# Patient Record
Sex: Male | Born: 1960 | Race: Black or African American | Hispanic: No | Marital: Married | Smoking: Former smoker
Health system: Southern US, Community
[De-identification: ages and names within clinical notes are randomized; demographics above are authoritative.]

## PROBLEM LIST (undated history)

## (undated) DIAGNOSIS — K029 Dental caries, unspecified: Secondary | ICD-10-CM

## (undated) DIAGNOSIS — C801 Malignant (primary) neoplasm, unspecified: Secondary | ICD-10-CM

## (undated) DIAGNOSIS — H547 Unspecified visual loss: Secondary | ICD-10-CM

## (undated) DIAGNOSIS — E669 Obesity, unspecified: Secondary | ICD-10-CM

## (undated) DIAGNOSIS — Z2821 Immunization not carried out because of patient refusal: Secondary | ICD-10-CM

## (undated) DIAGNOSIS — I10 Essential (primary) hypertension: Secondary | ICD-10-CM

## (undated) DIAGNOSIS — E119 Type 2 diabetes mellitus without complications: Secondary | ICD-10-CM

## (undated) DIAGNOSIS — Z720 Tobacco use: Secondary | ICD-10-CM

## (undated) DIAGNOSIS — C649 Malignant neoplasm of unspecified kidney, except renal pelvis: Secondary | ICD-10-CM

## (undated) DIAGNOSIS — M419 Scoliosis, unspecified: Secondary | ICD-10-CM

## (undated) DIAGNOSIS — E785 Hyperlipidemia, unspecified: Secondary | ICD-10-CM

## (undated) HISTORY — DX: Hyperlipidemia, unspecified: E78.5

## (undated) HISTORY — DX: Obesity, unspecified: E66.9

## (undated) HISTORY — DX: Malignant (primary) neoplasm, unspecified: C80.1

## (undated) HISTORY — DX: Unspecified visual loss: H54.7

## (undated) HISTORY — DX: Tobacco use: Z72.0

## (undated) HISTORY — DX: Scoliosis, unspecified: M41.9

## (undated) HISTORY — PX: COLONOSCOPY W/ POLYPECTOMY: SHX1380

## (undated) HISTORY — DX: Immunization not carried out because of patient refusal: Z28.21

## (undated) HISTORY — DX: Type 2 diabetes mellitus without complications: E11.9

## (undated) HISTORY — DX: Dental caries, unspecified: K02.9

---

## 1994-02-17 HISTORY — PX: EYE SURGERY: SHX253

## 2005-04-06 ENCOUNTER — Emergency Department (HOSPITAL_COMMUNITY): Admission: EM | Admit: 2005-04-06 | Discharge: 2005-04-06 | Payer: Self-pay | Admitting: Emergency Medicine

## 2012-01-18 DIAGNOSIS — E119 Type 2 diabetes mellitus without complications: Secondary | ICD-10-CM

## 2012-01-18 HISTORY — DX: Type 2 diabetes mellitus without complications: E11.9

## 2012-02-12 ENCOUNTER — Emergency Department (HOSPITAL_COMMUNITY)
Admission: EM | Admit: 2012-02-12 | Discharge: 2012-02-12 | Disposition: A | Discharge status: Home / Self Care | Payer: Self-pay | Attending: Emergency Medicine | Admitting: Emergency Medicine

## 2012-02-12 ENCOUNTER — Encounter (HOSPITAL_COMMUNITY): Payer: Self-pay | Admitting: Emergency Medicine

## 2012-02-12 DIAGNOSIS — R358 Other polyuria: Secondary | ICD-10-CM | POA: Insufficient documentation

## 2012-02-12 DIAGNOSIS — R3589 Other polyuria: Secondary | ICD-10-CM | POA: Insufficient documentation

## 2012-02-12 DIAGNOSIS — R631 Polydipsia: Secondary | ICD-10-CM | POA: Insufficient documentation

## 2012-02-12 DIAGNOSIS — F17200 Nicotine dependence, unspecified, uncomplicated: Secondary | ICD-10-CM | POA: Insufficient documentation

## 2012-02-12 DIAGNOSIS — R3915 Urgency of urination: Secondary | ICD-10-CM | POA: Insufficient documentation

## 2012-02-12 DIAGNOSIS — E119 Type 2 diabetes mellitus without complications: Secondary | ICD-10-CM | POA: Insufficient documentation

## 2012-02-12 DIAGNOSIS — R350 Frequency of micturition: Secondary | ICD-10-CM | POA: Insufficient documentation

## 2012-02-12 LAB — URINE MICROSCOPIC-ADD ON

## 2012-02-12 LAB — POCT I-STAT, CHEM 8
BUN: 13 mg/dL (ref 6–23)
Creatinine, Ser: 0.9 mg/dL (ref 0.50–1.35)
Glucose, Bld: 472 mg/dL — ABNORMAL HIGH (ref 70–99)
Hemoglobin: 16.7 g/dL (ref 13.0–17.0)
Sodium: 137 mEq/L (ref 135–145)
TCO2: 24 mmol/L (ref 0–100)

## 2012-02-12 LAB — URINALYSIS, ROUTINE W REFLEX MICROSCOPIC
Bilirubin Urine: NEGATIVE
Glucose, UA: >1000 mg/dL — AB
Hgb urine dipstick: NEGATIVE
Ketones, ur: NEGATIVE mg/dL
Protein, ur: NEGATIVE mg/dL
Urobilinogen, UA: 1 mg/dL (ref 0.0–1.0)

## 2012-02-12 LAB — GLUCOSE, CAPILLARY: Glucose-Capillary: 464 mg/dL — ABNORMAL HIGH (ref 70–99)

## 2012-02-12 MED ORDER — METFORMIN HCL 500 MG PO TABS: 500.0000 mg | ORAL_TABLET | Freq: Two times a day (BID) | ORAL | Status: DC

## 2012-02-12 MED ORDER — SODIUM CHLORIDE 0.9 % IV BOLUS (SEPSIS)
1000.0000 mL | Freq: Once | INTRAVENOUS | Status: AC
  Administered 2012-02-12: 1000 mL via INTRAVENOUS

## 2012-02-12 NOTE — ED Provider Notes (Addendum)
History     CSN: [id number]  Arrival date & time 02/12/12  1352   First MD Initiated Contact with Patient 02/12/12 1502      No chief complaint on file.   (Consider location/radiation/quality/duration/timing/severity/associated sxs/prior treatment) Patient is a 51 y.o. male presenting with dysuria. The history is provided by the patient.  Dysuria  This is a new (Not truly dysuria frequency and urgency) problem. Episode onset: 1-2 week. The problem occurs every urination. The problem has been gradually worsening. Quality: No pain. The pain is at a severity of 0/10. The patient is experiencing no pain. There has been no fever. Associated symptoms include frequency and urgency. Pertinent negatives include no chills, no sweats, no vomiting and no discharge. Associated symptoms comments: Polydipsia and polyuria. He has tried nothing for the symptoms. His past medical history does not include recurrent UTIs.    History reviewed. No pertinent past medical history.  History reviewed. No pertinent past surgical history.  History reviewed. No pertinent family history.  History  Substance Use Topics  . Smoking status: Current Every Day Smoker -- 0.5 packs/day    Types: Cigarettes  . Smokeless tobacco: Not on file  . Alcohol Use: No      Review of Systems  Constitutional: Negative for fever, chills, fatigue and unexpected weight change.  Respiratory: Negative for cough and shortness of breath.   Gastrointestinal: Negative for vomiting.  Genitourinary: Positive for dysuria, urgency and frequency.  All other systems reviewed and are negative.    Allergies  Review of patient's allergies indicates no known allergies.  Home Medications  No current outpatient prescriptions on file.  BP 142/85  Pulse 92  Temp 97.6 F (36.4 C)  Resp 18  SpO2 98%  Physical Exam  Nursing note and vitals reviewed. Constitutional: He is oriented to person, place, and time. He appears well-developed  and well-nourished. No distress.  HENT:  Head: Normocephalic and atraumatic.  Mouth/Throat: Oropharynx is clear and moist.  Eyes: Conjunctivae normal and EOM are normal. Pupils are equal, round, and reactive to light.  Neck: Normal range of motion. Neck supple.  Cardiovascular: Normal rate, regular rhythm and intact distal pulses.   No murmur heard. Pulmonary/Chest: Effort normal and breath sounds normal. No respiratory distress. He has no wheezes. He has no rales.  Abdominal: Soft. He exhibits no distension. There is no tenderness. There is no rebound and no guarding.  Musculoskeletal: Normal range of motion. He exhibits no edema and no tenderness.  Neurological: He is alert and oriented to person, place, and time.  Skin: Skin is warm and dry. No rash noted. No erythema.  Psychiatric: He has a normal mood and affect. His behavior is normal.    ED Course  Procedures (including critical care time)  Labs Reviewed  GLUCOSE, CAPILLARY - Abnormal; Notable for the following:    Glucose-Capillary 464 (*)     All other components within normal limits  URINALYSIS, ROUTINE W REFLEX MICROSCOPIC - Abnormal; Notable for the following:    Specific Gravity, Urine 1.043 (*)     Glucose, UA >1000 (*)     All other components within normal limits  POCT I-STAT, CHEM 8 - Abnormal; Notable for the following:    Glucose, Bld 472 (*)     Calcium, Ion 1.27 (*)     All other components within normal limits  URINE MICROSCOPIC-ADD ON   No results found.   1. Diabetes       MDM   Patient with  polyuria, polydipsia, urinary frequency and urgency that started approximately 2 weeks ago and is worsening. He denies any fever or, dysuria, abdominal pain, chest pain, shortness of breath, weight change or fatigue. Patient stitch side blood sugar was 464. He has no prior history of diabetes but has a strong family history and based on his fingerstick blood sugar patient is a new onset diabetic. I-STAT and UA  pending. Will start patient on metformin and give him resources to find a regular Dr.        Gwyneth Sprout, MD 02/12/12 1536  Gwyneth Sprout, MD 02/12/12 [phone]

## 2012-02-12 NOTE — ED Notes (Signed)
C/o frequency, increasing thirst, denies burning with urination, states has been going on for 2 weeks, denies blood in urine

## 2012-02-24 ENCOUNTER — Institutional Professional Consult (permissible substitution): Payer: Self-pay | Admitting: Medical

## 2012-03-29 ENCOUNTER — Encounter: Payer: Self-pay | Admitting: Medical

## 2012-03-29 ENCOUNTER — Ambulatory Visit (INDEPENDENT_AMBULATORY_CARE_PROVIDER_SITE_OTHER): Payer: BC Managed Care – PPO | Admitting: Medical

## 2012-03-29 VITALS — BP 130/82 | HR 62 | Temp 98.3°F | Resp 16 | Ht 69.0 in | Wt 231.0 lb

## 2012-03-29 DIAGNOSIS — E669 Obesity, unspecified: Secondary | ICD-10-CM

## 2012-03-29 DIAGNOSIS — F17200 Nicotine dependence, unspecified, uncomplicated: Secondary | ICD-10-CM

## 2012-03-29 DIAGNOSIS — Z7189 Other specified counseling: Secondary | ICD-10-CM

## 2012-03-29 DIAGNOSIS — E119 Type 2 diabetes mellitus without complications: Secondary | ICD-10-CM

## 2012-03-29 DIAGNOSIS — K029 Dental caries, unspecified: Secondary | ICD-10-CM

## 2012-03-29 LAB — CBC WITH DIFFERENTIAL/PLATELET
Basophils Relative: 1 % (ref 0–1)
Eosinophils Absolute: 0.1 10*3/uL (ref 0.0–0.7)
MCH: 30 pg (ref 26.0–34.0)
MCHC: 33.9 g/dL (ref 30.0–36.0)
Neutro Abs: 5.2 10*3/uL (ref 1.7–7.7)
Neutrophils Relative %: 54 % (ref 43–77)
Platelets: 285 10*3/uL (ref 150–400)
RBC: 4.77 MIL/uL (ref 4.22–5.81)

## 2012-03-29 LAB — TSH: TSH: 0.771 u[IU]/mL (ref 0.350–4.500)

## 2012-03-29 LAB — COMPREHENSIVE METABOLIC PANEL
AST: 11 U/L (ref 0–37)
Albumin: 4.4 g/dL (ref 3.5–5.2)
Alkaline Phosphatase: 81 U/L (ref 39–117)
Calcium: 9.3 mg/dL (ref 8.4–10.5)
Chloride: 106 mEq/L (ref 96–112)
Glucose, Bld: 102 mg/dL — ABNORMAL HIGH (ref 70–99)
Potassium: 4.4 mEq/L (ref 3.5–5.3)
Sodium: 141 mEq/L (ref 135–145)
Total Protein: 6.8 g/dL (ref 6.0–8.3)

## 2012-03-29 LAB — LIPID PANEL: LDL Cholesterol: 128 mg/dL — ABNORMAL HIGH (ref 0–99)

## 2012-03-29 LAB — HEMOGLOBIN A1C
Hgb A1c MFr Bld: 9 % — ABNORMAL HIGH (ref <5.7)
Mean Plasma Glucose: 212 mg/dL — ABNORMAL HIGH (ref <117)

## 2012-03-29 NOTE — Progress Notes (Signed)
Subjective:  David [name] is a 52 y.o. male who presents as a new patient.   Was not formerly seeing primary care, but ended up going to the ED in late December for polyuria, polydipsia, diagnosed with diabetes, put on Metformin which he is taking.  He has made dietary changes, getting some exercise.  This is his first f/u sinc the hospital visit.  Overall no c/o, not checkgin glucose, has no glucometer.  He is avoiding high carbs, pizza.  No problems with the medication.  No other aggravating or relieving factors.    No other c/o.  The following portions of the patient's history were reviewed and updated as appropriate: allergies, current medications, past family history, past medical history, past social history, past surgical history and problem list.  ROS Otherwise as in subjective above  Objective: Physical Exam  Vital signs reviewed  General appearance: alert, no distress, WD/WN Oral cavity: MMM, bilat upper molars in decay, no other lesions Neck: supple, no lymphadenopathy, no thyromegaly, no masses Heart: RRR, normal S1, S2, no murmurs Lungs: CTA bilaterally, no wheezes, rhonchi, or rales Abdomen: +bs, soft, non tender, non distended, no masses, no hepatomegaly, no splenomegaly Pulses: 2+ radial pulses, 2+ pedal pulses, normal cap refill Ext: no edema   Assessment: Encounter Diagnoses  Name Primary?  . Type II or unspecified type diabetes mellitus without mention of complication, not stated as uncontrolled Yes  . Obesity   . Tobacco use disorder   . Counseling on health promotion and disease prevention   . Dental caries    Plan: Type II DM - new diagnosis 01/2012.  Reiterated etiology of diabetes, possible complications, visits for care, diet changes, exercise,foot checks, eye doctor f/u, routine f/u and labs.   Labs today, f/u pending labs, c/t same medication.  Obesity - advised lifestyle changes, weight loss  Tobacco use - discussed risks, advised he consider  smoking cessation  Counseling - advised flu and pneumococcal vaccines, but he declines both today  Dental caries - advised he f/u with dentist soon  Follow up: pending labs

## 2012-03-29 NOTE — Patient Instructions (Signed)
To help you manage your diabetes, I have included some basic information about diabetes, guidelines, and goals.  Type 2 Diabetes  Diabetes is a long-lasting (chronic) disease.  With diabetes, either the pancreas does not make enough of a hormone called insulin, or the body has trouble using the insulin that is made.  Over time, diabetes can damage the eyes, kidneys, and nerves causing retinopathy, nephropathy, and neuropathy.  Diabetes puts you at risk for heart disease and peripheral vascular disease which can lead to heart attack, stroke, foot ulcers, and amputations.    Our goal and hopefully your goal is to manage your diabetes in such a way to slow the progression of the disease and do all we can to keep you healthy  Home Care:   Eat healthy, exercise regularly, limit alcohol, and don't smoke!  Check your blood sugar (glucose) once a day before breakfast, or as indicated by our discussion today.  Take your medications daily, don't run out of medications.  Learn about low blood sugar (hypoglycemia). Know how to treat it.  Wear a necklace or bracelet that says you have diabetes.  Check your feet every night for cuts, sores, blisters, and redness. Tell your medical provider if you have problems.  Maintain a normal body weight, or normal BMI - height to weight ratio of 20-25.  Ask me about this.  GET HELP RIGHT AWAY IF:  You have trouble keeping your blood sugar in target range.  You have problems with your medicines.  You are sick and not getting better after 24 hours.  You have a sore or wound that is not healing.  You have vision problems or changes.  You have a fever.  Diet: I can provide you with additional information on this. Here are some basics: For your body to process sugar properly and to help maintain a healthy weight, eat 4-5 times daily in small portions instead of 1-2 large meals.  Do's:   whole grains such as whole grain pasta, rice, whole grains breads  and whole grain cereals.  Use small quantities such as 1/2 cup per serving or 2 slices of bread per serving.    Eat 3-5 fruits daily  Eat beans at least once daily  Eat almonds in small quantities at least 3 days per week    If you eat meat, eat small portions (3-4oz) of lean meats such as chicken, fish, and [place].  Eat as much NON corn and NON potato vegetables as you like, particularly raw or steamed  Drink several large glasses of water daily  Cautions:  Limit red meat  Limit corn and potatoes  Limit sweets, cake, pie, candy  Limit beer and alcohol  Avoid fried food, fast food, large portions  Avoid sugary drinks such as regular soda and sweet tea  Avoid diet soda, diet drinks   Exercise regularly since it has beneficial effects on the heart and blood sugars. Exercising at least three times per week can be as important as medication to a diabetic.  Find some form of exercise that you will enjoy doing regularly.  This can include walking, biking, kayaking, golfing, swimming, dance, aerobics, hiking, etc.  If you have joint problems, many local gyms have equipment to accommodate people with specific needs.    Vaccinations:  Diabetics are at increased risk for infection, and illnesses can take longer to resolve.  Current vaccine recommendations include yearly Influenza (flu) vaccine (recommended in October), Pneumococcal vaccine, Hepatitis B vaccine series, Tdap (tetanus,  diptheria and pertussis) vaccine every 10 years, and other age appropriate vaccinations.     Office visits:  We recommend routine medical care to make sure we are addressing prevention and issues as they arise.  Typically this could mean twice yearly or up to quarterly depending upon your unique health situation.  Exams should include a yearly physical, a yearly foot exam, and other examination as appropriate.  You should see an eye doctor yearly to help screen for and prevent blood vessel complications in your  eyes.  Labs: Diabetics should have blood work done at least twice yearly to monitor your Hemoglobin A1C (a three-month average of your blood sugars) and your cholesterol.  You should have your urine and blood checked yearly to screen for kidney damage.  This may include creatinine and micro-albumin levels.  Other labs as appropriate.    Blood pressure goals:  Goal blood pressure in diabetics should be 130/80 or less. Monitoring your blood pressure with a home blood pressure cuff of your own is an excellent idea.  If you are prescribed medication for blood pressure, take your medication every day, and don't run out of medication.  Having high blood pressure can damage your heart, eyes, kidneys, and put you at risk for heart attack and stroke.  Tobacco use:  If you smoke, dip or chew, quitting will reduce your risk of heart attack, stroke, peripheral vascular disease, and many cancers.   Diabetes Meal Planning Guide The diabetes meal planning guide is a tool to help you plan your meals and snacks. It is important for people with diabetes to manage their blood glucose (sugar) levels. Choosing the right foods and the right amounts throughout your day will help control your blood glucose. Eating right can even help you improve your blood pressure and reach or maintain a healthy weight.  CARBOHYDRATE COUNTING MADE EASY When you eat carbohydrates, they turn to sugar. This raises your blood glucose level. Counting carbohydrates can help you control this level so you feel better. When you plan your meals by counting carbohydrates, you can have more flexibility in what you eat and balance your medicine with your food intake.  Carbohydrate counting simply means adding up the total amount of carbohydrate grams in your meals and snacks. Try to eat about the same amount at each meal. Foods with carbohydrates are listed below.   Each portion below is 1 carbohydrate serving or 15 grams of carbohydrates.    1800  Calorie Diet for Diabetes Meal Planning The 1800 calorie diet is designed for eating up to 1800 calories each day. Following this diet and making healthy meal choices can help improve overall health. This diet controls blood sugar (glucose) levels and can also help lower blood pressure and cholesterol.  SERVING SIZES Measuring foods and serving sizes helps to make sure you are getting the right amount of food. The list below tells how big or small some common serving sizes are:  1 oz.........4 stacked dice.  3 oz........Marland KitchenDeck of cards.  1 tsp.......Marland KitchenTip of little finger.  1 tbs......Marland KitchenMarland KitchenThumb.  2 tbs.......Marland KitchenGolf ball.   cup......Marland KitchenHalf of a fist.  1 cup.......Marland KitchenA fist.   GUIDELINES FOR CHOOSING FOODS The goal of this diet is to eat a variety of foods and limit calories to 1800 each day. This can be done by choosing foods that are low in calories and fat. The diet also suggests eating small amounts of food frequently. Doing this helps control your blood glucose levels so they do not  get too high or too low. Each meal or snack may include a protein food source to help you feel more satisfied and to stabilize your blood glucose. Try to eat about the same amount of food around the same time each day. This includes weekend days, travel days, and days off work. Space your meals about 4 to 5 hours apart and add a snack between them if you wish.  For example, a daily food plan could include breakfast, a morning snack, lunch, dinner, and an evening snack. Healthy meals and snacks include whole grains, vegetables, fruits, lean meats, poultry, fish, and dairy products. As you plan your meals, select a variety of foods. Choose from the bread and starch, vegetable, fruit, dairy, and meat/protein groups. Examples of foods from each group and their suggested serving sizes are listed below. Use measuring cups and spoons to become familiar with what a healthy portion looks like. Bread and Starch Each serving  equals 15 grams of carbohydrates.  1 slice bread.   bagel.   cup cold cereal (unsweetened).   cup hot cereal or mashed potatoes.  1 small potato (size of a computer mouse).   cup cooked pasta or rice.   English muffin.  1 cup broth-based soup.  3 cups of popcorn.  4 to 6 whole-wheat crackers.   cup cooked beans, peas, or corn. Vegetable Each serving equals 5 grams of carbohydrates.   cup cooked vegetables.  1 cup raw vegetables.   cup tomato or vegetable juice. Fruit Each serving equals 15 grams of carbohydrates.  1 small apple or orange.  1 cup watermelon or strawberries.   cup applesauce (no sugar added).  2 tbs raisins.   banana.   cup canned fruit, packed in water, its own juice, or sweetened with a sugar substitute.   cup unsweetened fruit juice. Dairy Each serving equals 12 to 15 grams of carbohydrates.  1 cup fat-free milk.  6 oz artificially sweetened yogurt or plain yogurt.  1 cup low-fat buttermilk.  1 cup soy milk.  1 cup almond milk. Meat/Protein  1 large egg.  2 to 3 oz meat, poultry, or fish.   cup low-fat cottage cheese.  1 tbs peanut butter.  1 oz low-fat cheese.   cup tuna in water.   cup tofu. Fat  1 tsp oil.  1 tsp trans-fat-free margarine.  1 tsp butter.  1 tsp mayonnaise.  2 tbs avocado.  1 tbs salad dressing.  1 tbs cream cheese.  2 tbs sour cream.  SAMPLE 1800 CALORIE DIET PLAN Breakfast   cup unsweetened cereal (1 carb serving).  1 cup fat-free milk (1 carb serving).  1 slice whole-wheat toast (1 carb serving).   small banana (1 carb serving).  1 scrambled egg.  1 tsp trans-fat-free margarine. Lunch  Tuna sandwich.  2 slices whole-wheat bread (2 carb servings).   cup canned tuna in water, drained.  1 tbs reduced fat mayonnaise.  1 stalk celery, chopped.  2 slices tomato.  1 lettuce leaf.  1 cup carrot sticks.  24 to 30 seedless grapes (2 carb  servings).  6 oz light yogurt (1 carb serving). Afternoon Snack  3 graham cracker squares (1 carb serving).  Fat-free milk, 1 cup (1 carb serving).  1 tbs peanut butter. Dinner  3 oz salmon, broiled with 1 tsp oil.  1 cup mashed potatoes (2 carb servings) with 1 tsp trans-fat-free margarine.  1 cup fresh or frozen green beans.  1 cup steamed asparagus.  1 cup fat-free milk (1 carb serving). Evening Snack  3 cups air-popped popcorn (1 carb serving).  2 tbs parmesan cheese sprinkled on top.  MEAL PLAN Use this worksheet to help you make a daily meal plan based on the 1800 calorie diet suggestions. If you are using this plan to help you control your blood glucose, you may interchange carbohydrate-containing foods (dairy, starches, and fruits). Select a variety of fresh foods of varying colors and flavors. The total amount of carbohydrate in your meals or snacks is more important than making sure you include all of the food groups every time you eat. Choose from the following foods to build your day's meals:  8 Starches.  4 Vegetables.  3 Fruits.  2 Dairy.  6 to 7 oz Meat/Protein.  Up to 4 Fats.  Your dietician can use this worksheet to help you decide how many servings and which types of foods are right for you. BREAKFAST Food Group and Servings / Food Choice Starch ________________________________________________________ Dairy _________________________________________________________ Fruit _________________________________________________________ Meat/Protein __________________________________________________ Fat ___________________________________________________________ LUNCH Food Group and Servings / Food Choice Starch ________________________________________________________ Meat/Protein __________________________________________________ Vegetable _____________________________________________________ Fruit  _________________________________________________________ Dairy _________________________________________________________ Fat ___________________________________________________________ David [name] Food Group and Servings / Food Choice Starch ________________________________________________________ Meat/Protein __________________________________________________ Fruit __________________________________________________________ Dairy _________________________________________________________ David [name] Food Group and Servings / Food Choice Starch _________________________________________________________ Meat/Protein ___________________________________________________ Dairy __________________________________________________________ Vegetable ______________________________________________________ Fruit ___________________________________________________________ Fat ____________________________________________________________ David [name] Food Group and Servings / Food Choice Fruit __________________________________________________________ Meat/Protein ___________________________________________________ Dairy __________________________________________________________ Starch _________________________________________________________ DAILY TOTALS Starch ____________________________ Vegetable _________________________ Fruit _____________________________ Dairy _____________________________ Meat/Protein______________________ Fat _______________________________ Document Released: 08/26/2004 Document Revised: 04/28/2011 Document Reviewed: 12/20/2010 [employer] Patient Information 2013 [place], [place].

## 2012-03-30 ENCOUNTER — Other Ambulatory Visit: Payer: Self-pay | Admitting: Medical

## 2012-03-30 MED ORDER — METFORMIN HCL 1000 MG PO TABS: 1000.0000 mg | ORAL_TABLET | Freq: Two times a day (BID) | ORAL | Status: DC

## 2013-01-18 ENCOUNTER — Encounter: Payer: Self-pay | Admitting: Medical

## 2013-01-18 ENCOUNTER — Ambulatory Visit (INDEPENDENT_AMBULATORY_CARE_PROVIDER_SITE_OTHER): Payer: BC Managed Care – PPO | Admitting: Medical

## 2013-01-18 ENCOUNTER — Other Ambulatory Visit: Payer: Self-pay | Admitting: Medical

## 2013-01-18 VITALS — BP 122/80 | HR 88 | Temp 98.2°F | Resp 18 | Ht 69.0 in | Wt 237.0 lb

## 2013-01-18 DIAGNOSIS — Z0000 Encounter for general adult medical examination without abnormal findings: Secondary | ICD-10-CM

## 2013-01-18 DIAGNOSIS — R5381 Other malaise: Secondary | ICD-10-CM

## 2013-01-18 DIAGNOSIS — E669 Obesity, unspecified: Secondary | ICD-10-CM

## 2013-01-18 DIAGNOSIS — Z1211 Encounter for screening for malignant neoplasm of colon: Secondary | ICD-10-CM

## 2013-01-18 DIAGNOSIS — E119 Type 2 diabetes mellitus without complications: Secondary | ICD-10-CM | POA: Insufficient documentation

## 2013-01-18 DIAGNOSIS — Z125 Encounter for screening for malignant neoplasm of prostate: Secondary | ICD-10-CM

## 2013-01-18 DIAGNOSIS — R6882 Decreased libido: Secondary | ICD-10-CM

## 2013-01-18 DIAGNOSIS — L988 Other specified disorders of the skin and subcutaneous tissue: Secondary | ICD-10-CM

## 2013-01-18 DIAGNOSIS — Z2821 Immunization not carried out because of patient refusal: Secondary | ICD-10-CM

## 2013-01-18 HISTORY — DX: Immunization not carried out because of patient refusal: Z28.21

## 2013-01-18 LAB — CBC
HCT: 44.5 % (ref 39.0–52.0)
Hemoglobin: 15.1 g/dL (ref 13.0–17.0)
MCH: 30.3 pg (ref 26.0–34.0)
MCHC: 33.9 g/dL (ref 30.0–36.0)
MCV: 89.2 fL (ref 78.0–100.0)
Platelets: 294 10*3/uL (ref 150–400)
RBC: 4.99 MIL/uL (ref 4.22–5.81)
RDW: 13.9 % (ref 11.5–15.5)
WBC: 13.4 10*3/uL — ABNORMAL HIGH (ref 4.0–10.5)

## 2013-01-18 LAB — POCT URINALYSIS DIPSTICK
Blood, UA: NEGATIVE
Glucose, UA: NEGATIVE
Nitrite, UA: NEGATIVE
Urobilinogen, UA: NEGATIVE

## 2013-01-18 LAB — COMPREHENSIVE METABOLIC PANEL
ALT: 19 U/L (ref 0–53)
AST: 11 U/L (ref 0–37)
Albumin: 4.6 g/dL (ref 3.5–5.2)
Alkaline Phosphatase: 103 U/L (ref 39–117)
BUN: 15 mg/dL (ref 6–23)
CO2: 27 mEq/L (ref 19–32)
Calcium: 9.8 mg/dL (ref 8.4–10.5)
Chloride: 102 mEq/L (ref 96–112)
Creat: 1.06 mg/dL (ref 0.50–1.35)
Glucose, Bld: 121 mg/dL — ABNORMAL HIGH (ref 70–99)
Potassium: 4.2 mEq/L (ref 3.5–5.3)
Sodium: 140 mEq/L (ref 135–145)
Total Bilirubin: 0.6 mg/dL (ref 0.3–1.2)
Total Protein: 7.3 g/dL (ref 6.0–8.3)

## 2013-01-18 LAB — HEMOGLOBIN A1C: Hgb A1c MFr Bld: 6.8 % — ABNORMAL HIGH (ref <5.7)

## 2013-01-18 MED ORDER — DOXYCYCLINE HYCLATE 100 MG PO TABS: 100.0000 mg | ORAL_TABLET | Freq: Two times a day (BID) | ORAL | Status: DC

## 2013-01-18 MED ORDER — METFORMIN HCL 1000 MG PO TABS: 1000.0000 mg | ORAL_TABLET | Freq: Two times a day (BID) | ORAL | Status: DC

## 2013-01-18 NOTE — Patient Instructions (Addendum)
Pilonidal cyst - begin antibiotic Doxycyline twice daily x 10 days.  If not much improved, then we will need to refer you to general surgery.  Please call or return in 2-3 week on this issue.  Pilonidal Cyst A pilonidal cyst occurs when hairs get trapped (ingrown) beneath the skin in the crease between the buttocks over your sacrum (the bone under that crease). Pilonidal cysts are most common in young men with a lot of body hair. When the cyst is ruptured (breaks) or leaking, fluid from the cyst may cause burning and itching. If the cyst becomes infected, it causes a painful swelling filled with pus (abscess). The pus and trapped hairs need to be removed (often by lancing) so that the infection can heal. However, recurrence is common and an operation may be needed to remove the cyst. HOME CARE INSTRUCTIONS   If the cyst was NOT INFECTED:  Keep the area clean and dry. Bathe or shower daily. Wash the area well with a germ-killing soap. Warm tub baths may help prevent infection and help with drainage. Dry the area well with a towel.  Avoid tight clothing to keep area as moisture free as possible.  Keep area between buttocks as free of hair as possible. A depilatory may be used.  If the cyst WAS INFECTED and needed to be drained:  Your caregiver packed the wound with gauze to keep the wound open. This allows the wound to heal from the inside outwards and continue draining.  Return for a wound check in 1 day or as suggested.  If you take tub baths or showers, repack the wound with gauze following them. Sponge baths (at the sink) are a good alternative.  If an antibiotic was ordered to fight the infection, take as directed.  Only take over-the-counter or prescription medicines for pain, discomfort, or fever as directed by your caregiver.  After the drain is removed, use sitz baths for 20 minutes 4 times per day. Clean the wound gently with mild unscented soap, pat dry, and then apply a dry  dressing. SEEK MEDICAL CARE IF:   You have increased pain, swelling, redness, drainage, or bleeding from the area.  You have a fever.  You have muscles aches, dizziness, or a general ill feeling. Document Released: 02/01/2000 Document Revised: 04/28/2011 Document Reviewed: 03/31/2008 [facility] Patient Information 2014 [place], [place].   Diabetes - continue metformin, healthy diet, exercise.  YOU CAN QUIT SMOKING!  Talk to your medical provider about using medicines to help you quit. These include nicotine replacement gum, lozenges, or skin patches.  Consider calling 1-800-QUIT-NOW, a toll free 24/7 hotline with free counseling to help you quit.  If you are ready to quit smoking or are thinking about it, congratulations! You have chosen to help yourself be healthier and live longer! There are lots of different ways to quit smoking. Nicotine gum, nicotine patches, a nicotine inhaler, or nicotine nasal spray can help with physical craving. Hypnosis, support groups, and medicines help break the habit of smoking. TIPS TO GET OFF AND STAY OFF CIGARETTES  Learn to predict your moods. Do not let a bad situation be your excuse to have a cigarette. Some situations in your life might tempt you to have a cigarette.   Ask friends and co-workers not to smoke around you.   Make your home smoke-free.   Never have "just one" cigarette. It leads to wanting another and another. Remind yourself of your decision to quit.   On a card, make  a list of your reasons for not smoking. Read it at least the same number of times a day as you have a cigarette. Tell yourself everyday, "I do not want to smoke. I choose not to smoke."   Ask someone at home or work to help you with your plan to quit smoking.   Have something planned after you eat or have a cup of coffee. Take a walk or get other exercise to perk you up. This will help to keep you from overeating.   Try a relaxation exercise to calm you down and  decrease your stress. Remember, you may be tense and nervous the first two weeks after you quit. This will pass.   Find new activities to keep your hands busy. Play with a pen, coin, or rubber band. Doodle or draw things on paper.   Brush your teeth right after eating. This will help cut down the craving for the taste of tobacco after meals. You can try mouthwash too.   Try gum, breath mints, or diet candy to keep something in your mouth.  IF YOU SMOKE AND WANT TO QUIT:  Do not stock up on cigarettes. Never buy a carton. Wait until one pack is finished before you buy another.   Never carry cigarettes with you at work or at home.   Keep cigarettes as far away from you as possible. Leave them with someone else.   Never carry matches or a lighter with you.   Ask yourself, "Do I need this cigarette or is this just a reflex?"   Bet with someone that you can quit. Put cigarette money in a piggy bank every morning. If you smoke, you give up the money. If you do not smoke, by the end of the week, you keep the money.   Keep trying. It takes 21 days to change a habit!  Document Released: 11/30/2008 Document Revised: 10/16/2010 Document Reviewed: 11/30/2008 [facility] Patient Information 2012 [place], [place].  We will refer you for your first colonoscopy.
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
## 2013-01-19 ENCOUNTER — Other Ambulatory Visit: Payer: Self-pay | Admitting: Medical

## 2013-01-19 DIAGNOSIS — E785 Hyperlipidemia, unspecified: Secondary | ICD-10-CM

## 2013-01-19 LAB — MICROALBUMIN / CREATININE URINE RATIO
Creatinine, Urine: 209.4 mg/dL
Microalb Creat Ratio: 3.9 mg/g (ref 0.0–30.0)

## 2013-01-19 LAB — LIPID PANEL
Cholesterol: 248 mg/dL — ABNORMAL HIGH (ref 0–200)
Triglycerides: 214 mg/dL — ABNORMAL HIGH (ref <150)
VLDL: 43 mg/dL — ABNORMAL HIGH (ref 0–40)

## 2013-01-21 ENCOUNTER — Encounter: Payer: Self-pay | Admitting: Medical

## 2013-01-21 ENCOUNTER — Ambulatory Visit (INDEPENDENT_AMBULATORY_CARE_PROVIDER_SITE_OTHER): Payer: BC Managed Care – PPO | Admitting: Medical

## 2013-01-21 VITALS — BP 120/82 | HR 82 | Temp 98.2°F | Resp 16 | Wt 238.0 lb

## 2013-01-21 DIAGNOSIS — E785 Hyperlipidemia, unspecified: Secondary | ICD-10-CM

## 2013-01-21 DIAGNOSIS — E119 Type 2 diabetes mellitus without complications: Secondary | ICD-10-CM

## 2013-01-21 DIAGNOSIS — E291 Testicular hypofunction: Secondary | ICD-10-CM

## 2013-01-21 DIAGNOSIS — R7989 Other specified abnormal findings of blood chemistry: Secondary | ICD-10-CM

## 2013-01-21 MED ORDER — ATORVASTATIN CALCIUM 20 MG PO TABS: 20.0000 mg | ORAL_TABLET | Freq: Every day | ORAL | Status: DC

## 2013-01-21 MED ORDER — TESTOSTERONE 20.25 MG/1.25GM (1.62%) TD GEL: 2.0000 | TRANSDERMAL | Status: DC

## 2013-01-21 NOTE — Progress Notes (Signed)
  Subjective:  David [name] is a 52 y.o. male who presents for recheck.  He came in recently to see me for physical, labs were done. He also reported low libido, fatigue, and we checked his hormone level.  He has no history of hypogonadism. He is here today to discuss lab results and next steps.  No other c/o.  The following portions of the patient's history were reviewed and updated as appropriate: allergies, current medications, past family history, past medical history, past social history, past surgical history and problem list.  ROS Otherwise as in subjective above  Objective: Physical Exam  BP 120/82  Pulse 82  Temp(Src) 98.2 F (36.8 C) (Oral)  Resp 16  Wt 238 lb (107.956 kg)  Gen: wd,wn, nad   Assessment: Encounter Diagnoses  Name Primary?  . Low testosterone Yes  . Dyslipidemia   . Type II or unspecified type diabetes mellitus without mention of complication, not stated as uncontrolled      Plan: Low testosterone - we discussed his recent low testosterone level. We will recheck a testosterone level today along with prolactin and TSH. Assuming repeat low testosterone, normal prolactin and TSH, he can begin testosterone therapy.  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  He recently checked his PSA which was normal, there are no other worrisome signs suggestive of other causes of hypogonadism. Discussed the risk and benefits of testosterone therapy, discussed proper use and safety, prescription given for AndroGel 1 pump per shoulder daily. Advise he not get this filled until we have the lab results back.  Dyslipidemia - reviewed his labs, given his diabetes, lab readings, will begin on Lipitor 20 mg nightly along with aspirin 81 mg nightly. Discussed risk and benefits of medication.  DM type 2 - reviewed his recent labs, continue diabetic diet, current medications,  followup 3 months  Follow up: pending labs

## 2013-01-31 ENCOUNTER — Telehealth: Payer: Self-pay | Admitting: Family Medicine

## 2013-01-31 NOTE — Telephone Encounter (Signed)
Pt called and states Androgel is 384.00 at [facility] on [address] and he cannot afford.  What do you want to do?  Please call pt [phone]

## 2013-01-31 NOTE — Telephone Encounter (Signed)
Have him call his insurer to find out what testosterone product he has to use first - androgel, testim, fortesta, axiron???

## 2013-02-01 ENCOUNTER — Telehealth: Payer: Self-pay | Admitting: Medical

## 2013-02-01 NOTE — Telephone Encounter (Signed)
Patient is aware that he will need to call his insurance carrier to see about testosterone medications. CLS

## 2013-02-02 NOTE — Telephone Encounter (Signed)
LM

## 2013-03-07 ENCOUNTER — Ambulatory Visit (AMBULATORY_SURGERY_CENTER): Payer: Self-pay

## 2013-03-07 VITALS — Ht 69.0 in | Wt 239.0 lb

## 2013-03-07 DIAGNOSIS — Z1211 Encounter for screening for malignant neoplasm of colon: Secondary | ICD-10-CM

## 2013-03-07 MED ORDER — MOVIPREP 100 G PO SOLR: 1.0000 | Freq: Once | ORAL | Status: DC

## 2013-03-08 ENCOUNTER — Encounter: Payer: Self-pay | Admitting: Internal Medicine

## 2013-03-15 ENCOUNTER — Ambulatory Visit (AMBULATORY_SURGERY_CENTER): Payer: BC Managed Care – PPO | Admitting: Internal Medicine

## 2013-03-15 ENCOUNTER — Encounter: Payer: Self-pay | Admitting: Internal Medicine

## 2013-03-15 VITALS — BP 143/86 | HR 59 | Temp 97.6°F | Resp 17 | Ht 69.0 in | Wt 239.0 lb

## 2013-03-15 DIAGNOSIS — D126 Benign neoplasm of colon, unspecified: Secondary | ICD-10-CM

## 2013-03-15 DIAGNOSIS — Z1211 Encounter for screening for malignant neoplasm of colon: Secondary | ICD-10-CM

## 2013-03-15 MED ORDER — SODIUM CHLORIDE 0.9 % IV SOLN: 500.0000 mL | INTRAVENOUS | Status: DC

## 2013-03-15 NOTE — Patient Instructions (Signed)
YOU HAD AN ENDOSCOPIC PROCEDURE TODAY AT THE [facility]: Refer to the procedure report that was given to you for any specific questions about what was found during the examination.  If the procedure report does not answer your questions, please call your gastroenterologist to clarify.  If you requested that your care partner not be given the details of your procedure findings, then the procedure report has been included in a sealed envelope for you to review at your convenience later.  YOU SHOULD EXPECT: Some feelings of bloating in the abdomen. Passage of more gas than usual.  Walking can help get rid of the air that was put into your GI tract during the procedure and reduce the bloating. If you had a lower endoscopy (such as a colonoscopy or flexible sigmoidoscopy) you may notice spotting of blood in your stool or on the toilet paper. If you underwent a bowel prep for your procedure, then you may not have a normal bowel movement for a few days.  DIET: Your first meal following the procedure should be a light meal and then it is ok to progress to your normal diet.  A half-sandwich or bowl of soup is an example of a good first meal.  Heavy or fried foods are harder to digest and may make you feel nauseous or bloated.  Likewise meals heavy in dairy and vegetables can cause extra gas to form and this can also increase the bloating.  Drink plenty of fluids but you should avoid alcoholic beverages for 24 hours.  ACTIVITY: Your care partner should take you home directly after the procedure.  You should plan to take it easy, moving slowly for the rest of the day.  You can resume normal activity the day after the procedure however you should NOT DRIVE or use heavy machinery for 24 hours (because of the sedation medicines used during the test).    SYMPTOMS TO REPORT IMMEDIATELY: A gastroenterologist can be reached at any hour.  During normal business hours, 8:30 AM to 5:00 PM Monday through Friday,  call [phone].  After hours and on weekends, please call the GI answering service at [phone] who will take a message and have the physician on call contact you.   Following lower endoscopy (colonoscopy or flexible sigmoidoscopy):  Excessive amounts of blood in the stool  Significant tenderness or worsening of abdominal pains  Swelling of the abdomen that is new, acute  Fever of 100F or higher  FOLLOW UP: If any biopsies were taken you will be contacted by phone or by letter within the next 1-3 weeks.  Call your gastroenterologist if you have not heard about the biopsies in 3 weeks.  Our staff will call the home number listed on your records the next business day following your procedure to check on you and address any questions or concerns that you may have at that time regarding the information given to you following your procedure. This is a courtesy call and so if there is no answer at the home number and we have not heard from you through the emergency physician on call, we will assume that you have returned to your regular daily activities without incident.  SIGNATURES/CONFIDENTIALITY: You and/or your care partner have signed paperwork which will be entered into your electronic medical record.  These signatures attest to the fact that that the information above on your After Visit Summary has been reviewed and is understood.  Full responsibility of the confidentiality of this   discharge information lies with you and/or your care-partner.  Try to eat a high fiber to prevent Diverticulitis.

## 2013-03-15 NOTE — Op Note (Signed)
[facility]  [address] [place], [id number]   COLONOSCOPY PROCEDURE REPORT  PATIENT: David [name], David [name]  MR#: [id number] BIRTHDATE: Jun 23, 1960 , 52  yrs. old GENDER: Male ENDOSCOPIST: Jerene Bears, MD PROCEDURE DATE:  03/15/2013 PROCEDURE:   Colonoscopy with snare polypectomy and Colonoscopy with cold biopsy polypectomy First Screening Colonoscopy - Avg.  risk and is 50 yrs.  old or older Yes.  Prior Negative Screening - Now for repeat screening. N/A  History of Adenoma - Now for follow-up colonoscopy & has been > or = to 3 yrs.  N/A  Polyps Removed Today? Yes. ASA CLASS:   Class II INDICATIONS:average risk screening and first colonoscopy. MEDICATIONS: MAC sedation, administered by CRNA and propofol (Diprivan) 300mg  IV  DESCRIPTION OF PROCEDURE:   After the risks benefits and alternatives of the procedure were thoroughly explained, informed consent was obtained.  A digital rectal exam revealed no rectal mass.   The LB [id number]  endoscope was introduced through the anus and advanced to the cecum, which was identified by both the appendix and ileocecal valve. No adverse events experienced. The quality of the prep was good, using MoviPrep  The instrument was then slowly withdrawn as the colon was fully examined.      COLON FINDINGS: Two sessile polyps measuring 3-6 mm in size were found in the transverse colon.  Polypectomy was performed with cold forceps (1) and using cold snare (1).  All resections were complete and all polyp tissue was completely retrieved.   Two sessile polyps measuring 4-5 mm in size were found in the descending colon and sigmoid colon.  Polypectomy was performed with cold forceps (1) and using cold snare (1).  All resections were complete and all polyp tissue was completely retrieved.   Three sessile polyps ranging between 3-38mm in size were found in the rectosigmoid colon. Polypectomy was performed using cold snare.  All resections  were complete and all polyp tissue was completely retrieved.   There was mild diverticulosis noted in the descending colon and sigmoid colon with associated muscular hypertrophy.  Retroflexed views revealed no abnormalities. The time to cecum=4 minutes 30 seconds. Withdrawal time=18 minutes 50 seconds.  The scope was withdrawn and the procedure completed. COMPLICATIONS: There were no complications.  ENDOSCOPIC IMPRESSION: 1.   Two sessile polyps measuring 3-6 mm in size were found in the transverse colon; Polypectomy was performed with cold forceps and using cold snare 2.   Two sessile polyps measuring 4-5 mm in size were found in the descending colon and sigmoid colon; Polypectomy was performed with cold forceps and using cold snare 3.   Three sessile polyps ranging between 3-61mm in size were found in the rectosigmoid colon; Polypectomy was performed using cold snare 4.   There was mild diverticulosis noted in the descending colon and sigmoid colon  RECOMMENDATIONS: 1.  Await pathology results 2.  High fiber diet 3.  Timing of repeat colonoscopy will be determined by pathology findings. 4.  You will receive a letter within 1-2 weeks with the results of your biopsy as well as final recommendations.  Please call my office if you have not received a letter after 3 weeks.   eSigned:  Jerene Bears, MD 03/15/2013 2:12 PM   cc: The Patient; Camelia Eng. Tysinger, PA-C   PATIENT NAME:  David [name], David [name] MR#: [id number]

## 2013-03-15 NOTE — Progress Notes (Signed)
Called to room to assist during endoscopic procedure.  Patient ID and intended procedure confirmed with present staff. Received instructions for my participation in the procedure from the performing physician.  

## 2013-03-15 NOTE — Progress Notes (Signed)
Lidocaine-40mg IV prior to Propofol InductionPropofol given over incremental dosages 

## 2013-03-16 ENCOUNTER — Telehealth: Payer: Self-pay | Admitting: *Deleted

## 2013-03-16 NOTE — Telephone Encounter (Signed)
  Follow up Call-  Call back number 03/15/2013  Post procedure Call Back phone  # [phone]  Permission to leave phone message Yes     Patient questions:  Do you have a fever, pain , or abdominal swelling? no Pain Score  0 *  Have you tolerated food without any problems? yes  Have you been able to return to your normal activities? yes  Do you have any questions about your discharge instructions: Diet   no Medications  no Follow up visit  no  Do you have questions or concerns about your Care? no  Actions: * If pain score is 4 or above: No action needed, pain <4.

## 2013-03-18 ENCOUNTER — Encounter: Payer: Self-pay | Admitting: Medical

## 2013-03-18 ENCOUNTER — Ambulatory Visit (INDEPENDENT_AMBULATORY_CARE_PROVIDER_SITE_OTHER): Payer: BC Managed Care – PPO | Admitting: Medical

## 2013-03-18 VITALS — BP 140/90 | HR 78 | Temp 98.0°F | Resp 16 | Wt 238.0 lb

## 2013-03-18 DIAGNOSIS — E785 Hyperlipidemia, unspecified: Secondary | ICD-10-CM

## 2013-03-18 DIAGNOSIS — M549 Dorsalgia, unspecified: Secondary | ICD-10-CM

## 2013-03-18 DIAGNOSIS — L988 Other specified disorders of the skin and subcutaneous tissue: Secondary | ICD-10-CM

## 2013-03-18 DIAGNOSIS — E291 Testicular hypofunction: Secondary | ICD-10-CM

## 2013-03-18 DIAGNOSIS — G8929 Other chronic pain: Secondary | ICD-10-CM

## 2013-03-18 MED ORDER — MELOXICAM 15 MG PO TABS: ORAL_TABLET | ORAL | Status: DC

## 2013-03-18 MED ORDER — CYCLOBENZAPRINE HCL 10 MG PO TABS: ORAL_TABLET | ORAL | Status: DC

## 2013-03-18 MED ORDER — ATORVASTATIN CALCIUM 20 MG PO TABS: 20.0000 mg | ORAL_TABLET | Freq: Every day | ORAL | Status: DC

## 2013-03-18 NOTE — Progress Notes (Signed)
Subjective: Here for multiple c/o.  Still having problems with the Draining buttock lesion, hx/o pilonidal disease.  Still drains on and off.  Used the doxycycline with no improvement.  Not in a lot of pain, but no improvement.   Having some back pain.  Has hx/o scoliosis, gets low back pain, daily.  He notes back pain daily for 3 years, but pain is worsening.   When he gets up in the morning, back is very stiff.  Has newer mattresses, so he knows its not the mattresses.  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  Exercises with walking, some stretching.   Deals with the back pain with ibuprofen 1-2 times daily.  No numbness, no weakness, no tingling.  Mainly pain across low back.  Last back xray unknown.  No injury or trauma.  No prior specialist eval for this.  No other aggravating or relieving factors.    He notes after his last visit was unable to get his cholesterol medicine or AndroGel.  Your insurance issues or pharmacy issues, wants to talk about this.   Past Medical History  Diagnosis Date  . Diabetes mellitus, type II 01/2012  . Scoliosis   . Vision impairment     left, due to prior trauma  . Obesity   . Tobacco use   . Dental caries   . Influenza vaccine refused 01/18/2013  . Pneumococcal vaccine refused 01/18/2013   Past Surgical History  Procedure Laterality Date  . Eye surgery  1996    due to trauma, left eye   ROS as in subjective   Objective: General: Overweight AA male Skin: top of gluteal cleft with small dimple, another dimple halfway down gluteal fold, right buttock adjacent to sacrum coccyx region with few multiple areas of slight fluctuance, induration and likely tunneling pilonidal disease, but no warmth, no drainage, not too tender Abdomen: Nontender no mass no hepatosplenomegaly Back: There is some fairly obvious scoliosis, asymmetry of the right back higher than the left on bent over test, tender bilateral  lumbar paraspinal region, asymmetry of hip creases, mild pain with range of motion which is electively full MSK: Lower extremities nontender, normal range of motion Lower extremities neurovascularly intact  Assessment: Encounter Diagnoses  Name Primary?  . Pilonidal disease Yes  . Chronic back pain   . Hyperlipidemia   . Hypogonadism male     Plan: Pilonidal disease  - referral to general surgery.  Chronic back pain-begin Mobic once daily, begin Flexeril when necessary, referral to physical therapy, advised she work on weight loss hyperlipidemia-re sent his cholesterol medicine, asked him to call back today if there is any issue where he can't get the medication Hypogonadism - I asked him to speak with our office to try to figure out what went wrong.  Androgel should be in the preferred formulary for [facility] F/u pending referrals

## 2013-03-21 ENCOUNTER — Encounter: Payer: Self-pay | Admitting: Internal Medicine

## 2013-03-21 ENCOUNTER — Telehealth: Payer: Self-pay | Admitting: Medical

## 2013-03-22 ENCOUNTER — Telehealth: Payer: Self-pay | Admitting: Family Medicine

## 2013-03-22 NOTE — Telephone Encounter (Signed)
I fax over the patients information to [facility] and [facility]. Both places will contact the patient directly to schedule the appointments. CLS

## 2013-03-22 NOTE — Telephone Encounter (Signed)
Message copied by Armanda Magic on Tue Mar 22, 2013  9:44 AM ------      Message from: Carlena Hurl      Created: Fri Mar 18, 2013  9:32 AM       1-refer to physical therapy for chronic back pain            2-refer to general surgery for pilonidal disease ------

## 2013-03-25 NOTE — Telephone Encounter (Signed)
Pt informed

## 2013-03-31 ENCOUNTER — Telehealth: Payer: Self-pay | Admitting: Family Medicine

## 2013-04-01 ENCOUNTER — Ambulatory Visit (INDEPENDENT_AMBULATORY_CARE_PROVIDER_SITE_OTHER): Payer: BC Managed Care – PPO | Admitting: General Surgery

## 2013-04-01 ENCOUNTER — Encounter (INDEPENDENT_AMBULATORY_CARE_PROVIDER_SITE_OTHER): Payer: Self-pay | Admitting: General Surgery

## 2013-04-01 VITALS — BP 148/98 | HR 64 | Temp 97.8°F | Resp 16 | Ht 68.0 in | Wt 242.4 lb

## 2013-04-01 DIAGNOSIS — L0591 Pilonidal cyst without abscess: Secondary | ICD-10-CM

## 2013-04-01 DIAGNOSIS — L0592 Pilonidal sinus without abscess: Secondary | ICD-10-CM

## 2013-04-01 MED ORDER — AMOXICILLIN-POT CLAVULANATE 875-125 MG PO TABS: 1.0000 | ORAL_TABLET | Freq: Two times a day (BID) | ORAL | Status: AC

## 2013-04-01 NOTE — Progress Notes (Signed)
Patient ID: David [name], male   DOB: 01/02/1961, 53 y.o.   MRN: [id number]  Chief Complaint  Patient presents with  . Cyst    new pt- eval pilo cyst    HPI David [name] is a 53 y.o. male.  He is referred by Dorothea Ogle, PA for evaluation of chronic pilonidal disease.  The patient has a two-year history of intermittent drainage and discomfort in the natal cleft. I he has never had incision and drainage. He's never had surgery. He is taken a course of doxycycline on 2 separate occasions and it has helped some but it always comes back.  He has no significant colorectal disease. Recent colonoscopy by Dr. Elmo Putt showed some benign adenomatous polyps but no other significant colorectal disease.  HPI  Past Medical History  Diagnosis Date  . Diabetes mellitus, type II 01/2012  . Scoliosis   . Vision impairment     left, due to prior trauma  . Obesity   . Tobacco use   . Dental caries   . Influenza vaccine refused 01/18/2013  . Pneumococcal vaccine refused 01/18/2013  . Cancer     colon cancer  . Hyperlipidemia     Past Surgical History  Procedure Laterality Date  . Eye surgery  1996    due to trauma, left eye  . Colonoscopy w/ polypectomy      Family History  Problem Relation Age of Onset  . Diabetes Sister   . Other Sister     gastric bypass  . Hyperlipidemia Mother   . Cancer Father 69    prostate cancer  . Stroke Maternal Grandmother   . Heart disease Neg Hx   . Hypertension Neg Hx   . Colon cancer Neg Hx     Social History History  Substance Use Topics  . Smoking status: Current Every Day Smoker -- 0.25 packs/day for 20 years    Types: Cigarettes  . Smokeless tobacco: Never Used  . Alcohol Use: 0.0 oz/week    0 Cans of beer per week     Comment: once-twice yearly    No Known Allergies  Current Outpatient Prescriptions  Medication Sig Dispense Refill  . atorvastatin (LIPITOR) 20 MG tablet Take 1 tablet (20 mg total) by mouth daily.  30 tablet  5  .  cyclobenzaprine (FLEXERIL) 10 MG tablet 1/2-1 tablet po QHS prn  20 tablet  0  . meloxicam (MOBIC) 15 MG tablet 1/2 tablet daily in the morning with food.   After 2 weeks, can do a whole tablet daily  30 tablet  1  . metFORMIN (GLUCOPHAGE) 1000 MG tablet Take 1 tablet (1,000 mg total) by mouth 2 (two) times daily with a meal.  60 tablet  5  . Testosterone (ANDROGEL) 20.25 MG/1.25GM (1.62%) GEL Place 2 Squirts onto the skin every morning.  1.25 g  2  . amoxicillin-clavulanate (AUGMENTIN) 875-125 MG per tablet Take 1 tablet by mouth 2 (two) times daily.  28 tablet  0   No current facility-administered medications for this visit.    Review of Systems Review of Systems  Constitutional: Negative for fever, chills and unexpected weight change.  HENT: Negative for congestion, hearing loss, sore throat, trouble swallowing and voice change.   Eyes: Negative for visual disturbance.  Respiratory: Negative for cough and wheezing.   Cardiovascular: Negative for chest pain, palpitations and leg swelling.  Gastrointestinal: Negative for nausea, vomiting, abdominal pain, diarrhea, constipation, blood in stool, abdominal distention, anal bleeding and rectal pain.  Genitourinary: Negative for hematuria and difficulty urinating.  Musculoskeletal: Negative for arthralgias.  Skin: Positive for wound. Negative for rash.  Neurological: Negative for seizures, syncope, weakness and headaches.  Hematological: Negative for adenopathy. Does not bruise/bleed easily.  Psychiatric/Behavioral: Negative for confusion.    Blood pressure 148/98, pulse 64, temperature 97.8 F (36.6 C), temperature source Oral, resp. rate 16, height 5\' 8"  (1.727 m), weight 242 lb 6.4 oz (109.952 kg).  Physical Exam Physical Exam  Constitutional: He is oriented to person, place, and time. He appears well-developed and well-nourished. No distress.  Positive. Cooperative.  HENT:  Head: Normocephalic.  Nose: Nose normal.  Mouth/Throat: No  oropharyngeal exudate.  Eyes: Conjunctivae and EOM are normal. Pupils are equal, round, and reactive to light. Right eye exhibits no discharge. Left eye exhibits no discharge. No scleral icterus.  Neck: Normal range of motion. Neck supple. No JVD present. No tracheal deviation present. No thyromegaly present.  Cardiovascular: Normal rate, regular rhythm, normal heart sounds and intact distal pulses.   No murmur heard. Pulmonary/Chest: Effort normal and breath sounds normal. No stridor. No respiratory distress. He has no wheezes. He has no rales. He exhibits no tenderness.  Abdominal: Soft. Bowel sounds are normal. He exhibits no distension and no mass. There is no tenderness. There is no rebound and no guarding.  Obese. Soft. Nontender.  Genitourinary:  There is a pilonidal dimple in the midline but no cellulitis or drainage. I don't see any hairs in the wound. He is very hirsute. On the right side,  slightly posterior,  there were 2 chronic open areas. I was able to pass a probe from both of these areas directly back to the midline to where the midline dimple is. This confirms pilonidal fistula. There was no abscess or cellulitis. There was no evidence of perianal disease.  Musculoskeletal: Normal range of motion. He exhibits no edema and no tenderness.  Lymphadenopathy:    He has no cervical adenopathy.  Neurological: He is alert and oriented to person, place, and time. He has normal reflexes. Coordination normal.  Skin: Skin is warm and dry. No rash noted. He is not diaphoretic. No erythema. No pallor.  Psychiatric: He has a normal mood and affect. His behavior is normal. Judgment and thought content normal.    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 Minimally symptomatic  Obesity  Chronic back pain  Recent colonoscopy     Plan    I told him that definitive treatment of this would require  surgery opening all all of the fistula tracks and debriding the inflamed tissue. He was advised that the wound would heal by secondary intention. Told him that it might recur. I told him that there is a possibility of nonhealing.  He was not inclined to have surgery at this time and asked about more conservative therapy.  We're going to put him on a two-week course of Augmentin and skin care and see how he does.  Return to see [place] as needed.        Edsel Petrin. Dalbert Batman, M.D., [facility] General and Minimally invasive Surgery Breast and Colorectal Surgery Office:   [phone] Pager:   [phone]  04/01/2013, 4:30 PM

## 2013-04-01 NOTE — Patient Instructions (Signed)
You had at least 2 chronic pilonidal fistulae, tracking from the midline toward the right.  I did not see any indication of rectal disease.  This is a chronic problem and will likely recur from time to time until you have an operation  We talked about the surgical procedure involved. We talked about conservative therapy with skin care and antibiotics  At this point in time you stated that you would like to try a conservative course to see if things will get better since there is not a lot of pain or drainage.  Keep the area clean and well scrubbed once or twice daily  A prescription for a 2 week supply of Augmentin antibiotic has been called in to your pharmacy  Return to see Dr. Dalbert Batman as necessary.   Pilonidal Cyst A pilonidal cyst occurs when hairs get trapped (ingrown) beneath the skin in the crease between the buttocks over your sacrum (the bone under that crease). Pilonidal cysts are most common in young men with a lot of body hair. When the cyst is ruptured (breaks) or leaking, fluid from the cyst may cause burning and itching. If the cyst becomes infected, it causes a painful swelling filled with pus (abscess). The pus and trapped hairs need to be removed (often by lancing) so that the infection can heal. However, recurrence is common and an operation may be needed to remove the cyst. HOME CARE INSTRUCTIONS   If the cyst was NOT INFECTED:  Keep the area clean and dry. Bathe or shower daily. Wash the area well with a germ-killing soap. Warm tub baths may help prevent infection and help with drainage. Dry the area well with a towel.  Avoid tight clothing to keep area as moisture free as possible.  Keep area between buttocks as free of hair as possible. A depilatory may be used.  If the cyst WAS INFECTED and needed to be drained:  Your caregiver packed the wound with gauze to keep the wound open. This allows the wound to heal from the inside outwards and continue  draining.  Return for a wound check in 1 day or as suggested.  If you take tub baths or showers, repack the wound with gauze following them. Sponge baths (at the sink) are a good alternative.  If an antibiotic was ordered to fight the infection, take as directed.  Only take over-the-counter or prescription medicines for pain, discomfort, or fever as directed by your caregiver.  After the drain is removed, use sitz baths for 20 minutes 4 times per day. Clean the wound gently with mild unscented soap, pat dry, and then apply a dry dressing. SEEK MEDICAL CARE IF:   You have increased pain, swelling, redness, drainage, or bleeding from the area.  You have a fever.  You have muscles aches, dizziness, or a general ill feeling. Document Released: 02/01/2000 Document Revised: 04/28/2011 Document Reviewed: 03/31/2008 [facility] Patient Information 2014 [place].

## 2013-04-18 NOTE — Telephone Encounter (Signed)
dt ?

## 2013-05-04 ENCOUNTER — Telehealth: Payer: Self-pay | Admitting: Medical

## 2013-05-04 ENCOUNTER — Encounter: Payer: Self-pay | Admitting: Medical

## 2013-05-04 ENCOUNTER — Ambulatory Visit (INDEPENDENT_AMBULATORY_CARE_PROVIDER_SITE_OTHER): Payer: BC Managed Care – PPO | Admitting: Medical

## 2013-05-04 VITALS — BP 138/82 | HR 68 | Temp 98.0°F | Resp 16 | Wt 231.0 lb

## 2013-05-04 DIAGNOSIS — M549 Dorsalgia, unspecified: Secondary | ICD-10-CM

## 2013-05-04 DIAGNOSIS — R209 Unspecified disturbances of skin sensation: Secondary | ICD-10-CM

## 2013-05-04 DIAGNOSIS — S91339A Puncture wound without foreign body, unspecified foot, initial encounter: Secondary | ICD-10-CM

## 2013-05-04 DIAGNOSIS — S91309A Unspecified open wound, unspecified foot, initial encounter: Secondary | ICD-10-CM

## 2013-05-04 DIAGNOSIS — Z23 Encounter for immunization: Secondary | ICD-10-CM

## 2013-05-04 DIAGNOSIS — R200 Anesthesia of skin: Secondary | ICD-10-CM

## 2013-05-04 NOTE — Telephone Encounter (Signed)
Pt.notified

## 2013-05-04 NOTE — Telephone Encounter (Signed)
Please call patient. I spoke to Dr. Redmond School about the symptoms.  Let us have him use over-the-counter Aleve 1 tablet twice a day for one to 2 weeks.  This is to calm down inflammation around the nerve .  Avoid prolonged sitting on the toilet or in a chair, take frequent breaks if sitting for prolonged period.  If no improvement at all in 2 weeks, we will consider doing either imaging or nerve conduction study.

## 2013-05-04 NOTE — Progress Notes (Signed)
Subjective: Stepped on a screw at church sticking up from the floor 2 weeks ago.  Left volar distal foot.  No redness, drainage, no current pain.   Wants tetanus booster, can't recall last one.    He reports numbness right foot last 3 toes and lateral foot x 2 weeks.  Denies injury, trauma.  Denies pain, doesn't bother him, but just feels numb in the foot.  He does have hx/o back problems, back pain.    Since last visit he went to therapy and just completed physical therapy, back doing much better, sleeping better at night.  Not using any medication for back pain at this time.  Denies recent fevers, chills, weight loss, abdominal pain, no other weakness, numbness, tingling, no incontinence  Objective: Gen: Wd, wn, nad Skin: left volar distal foot mid foot with small 37mm deep puncture wound, scab present, but no erythema, induration, pain or drainage Back: good ROM, nontender today MSK: no LE deformity, normal ROM of toes, feet, ankles, legs Neuro: slight decreased sensation with light touch and monofilament to volar right 4th and 5th toes and volar MTP region, otherwise normal sensation, normal strength of toes and feet, rest of LE neuro exam unremarkable Pulses: normal No LE edema   Assessment: Encounter Diagnoses  Name Primary?  . Puncture wound of foot Yes  . Need for Tdap vaccination   . Numbness of foot   . Back pain      Plan: Left foot wound healing appropriately.  Advised to return if signs of infection as discussed.  Counseled on the Tdap (tetanus, diptheria, and acellular pertussis) vaccine.  Vaccine information sheet given. Tdap vaccine given after consent obtained.  Numbness of foot - seems to be S1 distribution with no good reason.  Will discussed with Dr. Redmond School and call patient back.   Back pain - much improved  Addendum: I spoke to Dr. Redmond School about the symptoms.  Let's have him use over-the-counter Aleve 1 tablet twice a day for one to 2 weeks.  This is to  calm down inflammation around the nerve .  Avoid prolonged sitting on the toilet or in a chair, take frequent breaks if sitting for prolonged period.  If no improvement at all in 2 weeks, we will consider doing either imaging or nerve conduction study.

## 2013-05-23 ENCOUNTER — Other Ambulatory Visit: Payer: Self-pay

## 2013-05-23 ENCOUNTER — Telehealth: Payer: Self-pay | Admitting: Medical

## 2013-05-23 ENCOUNTER — Other Ambulatory Visit: Payer: Self-pay | Admitting: Medical

## 2013-05-23 DIAGNOSIS — M549 Dorsalgia, unspecified: Principal | ICD-10-CM

## 2013-05-23 DIAGNOSIS — G8929 Other chronic pain: Secondary | ICD-10-CM

## 2013-05-23 DIAGNOSIS — M79609 Pain in unspecified limb: Secondary | ICD-10-CM

## 2013-05-23 DIAGNOSIS — R202 Paresthesia of skin: Secondary | ICD-10-CM

## 2013-05-23 NOTE — Telephone Encounter (Signed)
I don't believe we have any xrays at this point, so lets start with a lumbar x-ray

## 2013-05-24 NOTE — Telephone Encounter (Signed)
Patient is aware to go by [name] to get the Xrays done of his back. CLS

## 2013-05-25 ENCOUNTER — Ambulatory Visit
Admission: RE | Admit: 2013-05-25 | Discharge: 2013-05-25 | Disposition: A | Discharge status: Home / Self Care | Payer: BC Managed Care – PPO | Source: Ambulatory Visit | Attending: Medical | Admitting: Medical

## 2013-05-25 ENCOUNTER — Other Ambulatory Visit: Payer: Self-pay | Admitting: Internal Medicine

## 2013-05-25 ENCOUNTER — Encounter: Payer: Self-pay | Admitting: Internal Medicine

## 2013-05-25 DIAGNOSIS — G8929 Other chronic pain: Secondary | ICD-10-CM

## 2013-05-25 DIAGNOSIS — R202 Paresthesia of skin: Secondary | ICD-10-CM

## 2013-05-25 DIAGNOSIS — M79609 Pain in unspecified limb: Secondary | ICD-10-CM

## 2013-05-25 DIAGNOSIS — M549 Dorsalgia, unspecified: Principal | ICD-10-CM

## 2013-05-27 ENCOUNTER — Ambulatory Visit (INDEPENDENT_AMBULATORY_CARE_PROVIDER_SITE_OTHER): Payer: Self-pay

## 2013-05-27 ENCOUNTER — Ambulatory Visit (INDEPENDENT_AMBULATORY_CARE_PROVIDER_SITE_OTHER): Payer: BC Managed Care – PPO | Admitting: Neurology

## 2013-05-27 DIAGNOSIS — Z0289 Encounter for other administrative examinations: Secondary | ICD-10-CM

## 2013-05-27 DIAGNOSIS — R209 Unspecified disturbances of skin sensation: Secondary | ICD-10-CM

## 2013-05-27 DIAGNOSIS — M79609 Pain in unspecified limb: Secondary | ICD-10-CM

## 2013-05-27 NOTE — Procedures (Signed)
     HISTORY:  David [name] is a 53 year old gentleman with a history of diabetes with a six-week history of right foot numbness and some achy sensation in the left posterior thigh. The patient reports a chronic history of back pain. The patient is being evaluated for these new symptoms.  NERVE CONDUCTION STUDIES:  Nerve conduction studies were performed on both lower extremities. The distal motor latencies and motor amplitudes for the peroneal and posterior tibial nerves were within normal limits. The nerve conduction velocities for these nerves were also normal. The H reflex latencies were normal. The sensory latencies for the peroneal and sural nerves were within normal limits. The medial and lateral plantar sensory latencies were absent bilaterally.   EMG STUDIES:  EMG study was performed on the right lower extremity:  The tibialis anterior muscle reveals 2 to 4K motor units with full recruitment. No fibrillations or positive waves were seen. The peroneus tertius muscle reveals 2 to 4K motor units with full recruitment. No fibrillations or positive waves were seen. The medial gastrocnemius muscle reveals 1 to 3K motor units with full recruitment. No fibrillations or positive waves were seen. The vastus lateralis muscle reveals 2 to 4K motor units with full recruitment. No fibrillations or positive waves were seen. The iliopsoas muscle reveals 2 to 4K motor units with full recruitment. No fibrillations or positive waves were seen. The biceps femoris muscle (long head) reveals 2 to 4K motor units with slightly reduced recruitment. 2+ fibrillations and positive waves were seen. The lumbosacral paraspinal muscles were tested at 3 levels, and revealed no abnormalities of insertional activity at all 3 levels tested. There was good relaxation.  EMG study was performed on the left lower extremity:  The tibialis anterior muscle reveals 2 to 4K motor units with full recruitment. No fibrillations  or positive waves were seen. The peroneus tertius muscle reveals 2 to 4K motor units with full recruitment. No fibrillations or positive waves were seen. The medial gastrocnemius muscle reveals 1 to 3K motor units with full recruitment. No fibrillations or positive waves were seen. The vastus lateralis muscle reveals 2 to 4K motor units with full recruitment. No fibrillations or positive waves were seen. The iliopsoas muscle reveals 2 to 4K motor units with full recruitment. No fibrillations or positive waves were seen. The biceps femoris muscle (long head) reveals 2 to 4K motor units with full recruitment. No fibrillations or positive waves were seen. The lumbosacral paraspinal muscles were tested at 3 levels, and revealed no abnormalities of insertional activity at all 3 levels tested. There was good relaxation.   IMPRESSION:  Nerve conduction studies done on both lower extremities were unremarkable with exception of absence of the medial and lateral plantar sensory latencies in the feet. This may be associated with an early peripheral neuropathy or with bilateral tarsal tunnel syndrome. Clinical correlation is required. EMG evaluation of the right lower extremity shows isolated acute denervation of the biceps femoris muscle, which is of unclear clinical significance. A lumbosacral radiculopathy cannot be confirmed on this study. An incomplete lumbosacral plexopathy is possible associated with diabetes. EMG evaluation of the left lower extremity was unremarkable, without evidence of a lumbosacral radiculopathy.  Jill Alexanders MD 05/27/2013 12:55 PM  [facility] [address] [place],  [address]  Phone [phone] Fax [phone]

## 2013-06-01 NOTE — Progress Notes (Signed)
Quick Note:  The Sx. Are the same. Pain in the left calf muscle all the way up and no feeling in his 3 toes. CLS  Patient is aware of his results. CLS ______

## 2013-06-02 ENCOUNTER — Telehealth: Payer: Self-pay | Admitting: Family Medicine

## 2013-06-02 ENCOUNTER — Telehealth: Payer: Self-pay

## 2013-06-02 ENCOUNTER — Other Ambulatory Visit: Payer: Self-pay | Admitting: Medical

## 2013-06-02 MED ORDER — TRAMADOL HCL 50 MG PO TABS: 50.0000 mg | ORAL_TABLET | Freq: Three times a day (TID) | ORAL | Status: DC | PRN

## 2013-06-02 NOTE — Progress Notes (Signed)
Phill Myron request for pain med until we get referral to ortho

## 2013-06-02 NOTE — Telephone Encounter (Signed)
Called patient and informed him of his appt with Dr Mayer Camel 06/07/13 @2 :32 per Audelia Acton, last 2 OV and NCS faxed to [phone]

## 2013-06-02 NOTE — Telephone Encounter (Signed)
Wife said pt in a lot of pain and wants to know what to do now.  Please call pt or wife Vernette [phone].  Wife is on HIPAA

## 2013-06-02 NOTE — Telephone Encounter (Signed)
Message copied by Louie Bun on Thu Jun 02, 2013  4:01 PM ------      Message from: Carlena Hurl      Created: Thu Jun 02, 2013  1:30 PM       His nerve conduction studies do show some abnormality. The abnormality may be more related to the foot issues but with his history of diabetes and back pain, there could be a combination of factors.  Less referred to orthopedics for further evaluation. Send to [employer] and make sure you provide his last 2 office notes, his conduction study results  .Does he need me to prescribe something different for pain in the meantime or is the mobic helping ------

## 2015-09-30 ENCOUNTER — Encounter (HOSPITAL_COMMUNITY): Payer: Self-pay | Admitting: Emergency Medicine

## 2015-09-30 ENCOUNTER — Emergency Department (HOSPITAL_COMMUNITY)
Admission: EM | Admit: 2015-09-30 | Discharge: 2015-09-30 | Disposition: A | Discharge status: Home / Self Care | Payer: Medicaid Other | Attending: Emergency Medicine | Admitting: Emergency Medicine

## 2015-09-30 ENCOUNTER — Emergency Department (HOSPITAL_COMMUNITY): Payer: Medicaid Other

## 2015-09-30 DIAGNOSIS — F17210 Nicotine dependence, cigarettes, uncomplicated: Secondary | ICD-10-CM | POA: Insufficient documentation

## 2015-09-30 DIAGNOSIS — R1031 Right lower quadrant pain: Secondary | ICD-10-CM | POA: Diagnosis present

## 2015-09-30 DIAGNOSIS — C189 Malignant neoplasm of colon, unspecified: Secondary | ICD-10-CM | POA: Insufficient documentation

## 2015-09-30 DIAGNOSIS — R52 Pain, unspecified: Secondary | ICD-10-CM

## 2015-09-30 DIAGNOSIS — E119 Type 2 diabetes mellitus without complications: Secondary | ICD-10-CM | POA: Insufficient documentation

## 2015-09-30 DIAGNOSIS — C799 Secondary malignant neoplasm of unspecified site: Secondary | ICD-10-CM

## 2015-09-30 DIAGNOSIS — Z79899 Other long term (current) drug therapy: Secondary | ICD-10-CM | POA: Insufficient documentation

## 2015-09-30 LAB — CBC WITH DIFFERENTIAL/PLATELET
BASOS PCT: 0 %
Basophils Absolute: 0 10*3/uL (ref 0.0–0.1)
EOS PCT: 1 %
Eosinophils Absolute: 0.2 10*3/uL (ref 0.0–0.7)
HCT: 31.3 % — ABNORMAL LOW (ref 39.0–52.0)
HEMOGLOBIN: 10.2 g/dL — AB (ref 13.0–17.0)
Lymphocytes Relative: 16 %
Lymphs Abs: 2.4 10*3/uL (ref 0.7–4.0)
MCH: 26.8 pg (ref 26.0–34.0)
MCHC: 32.6 g/dL (ref 30.0–36.0)
MCV: 82.4 fL (ref 78.0–100.0)
Monocytes Absolute: 2.3 10*3/uL — ABNORMAL HIGH (ref 0.1–1.0)
Monocytes Relative: 15 %
NEUTROS PCT: 68 %
Neutro Abs: 10.3 10*3/uL — ABNORMAL HIGH (ref 1.7–7.7)
Platelets: 608 10*3/uL — ABNORMAL HIGH (ref 150–400)
RBC: 3.8 MIL/uL — AB (ref 4.22–5.81)
RDW: 13.2 % (ref 11.5–15.5)
WBC: 15.2 10*3/uL — AB (ref 4.0–10.5)

## 2015-09-30 LAB — URINALYSIS, ROUTINE W REFLEX MICROSCOPIC
Glucose, UA: NEGATIVE mg/dL
HGB URINE DIPSTICK: NEGATIVE
Ketones, ur: NEGATIVE mg/dL
Nitrite: NEGATIVE
Protein, ur: 30 mg/dL — AB
SPECIFIC GRAVITY, URINE: 1.021 (ref 1.005–1.030)
pH: 5.5 (ref 5.0–8.0)

## 2015-09-30 LAB — COMPREHENSIVE METABOLIC PANEL
ALT: 39 U/L (ref 17–63)
ANION GAP: 10 (ref 5–15)
AST: 22 U/L (ref 15–41)
Albumin: 3.1 g/dL — ABNORMAL LOW (ref 3.5–5.0)
Alkaline Phosphatase: 123 U/L (ref 38–126)
BUN: 20 mg/dL (ref 6–20)
CO2: 23 mmol/L (ref 22–32)
Calcium: 9 mg/dL (ref 8.9–10.3)
Chloride: 102 mmol/L (ref 101–111)
Creatinine, Ser: 1.5 mg/dL — ABNORMAL HIGH (ref 0.61–1.24)
GFR calc Af Amer: 58 mL/min — ABNORMAL LOW (ref >60)
GFR calc non Af Amer: 50 mL/min — ABNORMAL LOW (ref >60)
GLUCOSE: 172 mg/dL — AB (ref 65–99)
POTASSIUM: 3.9 mmol/L (ref 3.5–5.1)
SODIUM: 135 mmol/L (ref 135–145)
Total Bilirubin: 1.1 mg/dL (ref 0.3–1.2)
Total Protein: 7.8 g/dL (ref 6.5–8.1)

## 2015-09-30 LAB — URINE MICROSCOPIC-ADD ON

## 2015-09-30 LAB — LIPASE, BLOOD: Lipase: 43 U/L (ref 11–51)

## 2015-09-30 MED ORDER — IOPAMIDOL (ISOVUE-300) INJECTION 61%
75.0000 mL | Freq: Once | INTRAVENOUS | Status: AC | PRN
  Administered 2015-09-30: 60 mL via INTRAVENOUS

## 2015-09-30 MED ORDER — FENTANYL CITRATE (PF) 100 MCG/2ML IJ SOLN
50.0000 ug | Freq: Once | INTRAMUSCULAR | Status: AC
  Administered 2015-09-30: 50 ug via INTRAVENOUS
  Filled 2015-09-30: qty 2

## 2015-09-30 MED ORDER — OXYCODONE-ACETAMINOPHEN 5-325 MG PO TABS
1.0000 | ORAL_TABLET | Freq: Once | ORAL | Status: AC
  Administered 2015-09-30: 1 via ORAL
  Filled 2015-09-30: qty 1

## 2015-09-30 MED ORDER — IOPAMIDOL (ISOVUE-300) INJECTION 61%
100.0000 mL | Freq: Once | INTRAVENOUS | Status: AC | PRN
  Administered 2015-09-30: 100 mL via INTRAVENOUS

## 2015-09-30 MED ORDER — OXYCODONE-ACETAMINOPHEN 5-325 MG PO TABS: 1.0000 | ORAL_TABLET | Freq: Four times a day (QID) | ORAL | 0 refills | Status: DC | PRN

## 2015-09-30 MED ORDER — SODIUM CHLORIDE 0.9 % IV BOLUS (SEPSIS)
500.0000 mL | Freq: Once | INTRAVENOUS | Status: AC
Start: 2015-09-30 — End: 2015-09-30
  Administered 2015-09-30: 500 mL via INTRAVENOUS

## 2015-09-30 NOTE — ED Notes (Signed)
Patient transported to CT 

## 2015-09-30 NOTE — ED Triage Notes (Signed)
Pt reports decreased appetite since late last week accompanied by RLQ pain. Pt was constipated early last week but resolved after taking citrate. Pt also complains of belching and foul taste in mouth. No emesis.

## 2015-09-30 NOTE — Discharge Instructions (Signed)
Follow-up for the ultrasound at [facility] tomorrow at

## 2015-09-30 NOTE — ED Notes (Signed)
Spoke with [place] tech regarding IR appt tomorrow.  She said to have patient go to IR at 12:30 tomorrow (10/01/15) for a 1300 appt.

## 2015-09-30 NOTE — ED Provider Notes (Signed)
[facility] DEPT Provider Note   CSN: [id number] Arrival date & time: 09/30/15  1026  First Provider Contact:  First MD Initiated Contact with Patient 09/30/15 1039        History   Chief Complaint Chief Complaint  Patient presents with  . Abdominal Pain    HPI David [name] is a 55 y.o. male.  The history is provided by the patient.  Abdominal Pain   This is a new problem. Associated symptoms include constipation. Pertinent negatives include fever, diarrhea, nausea, vomiting and headaches.  Patient presents with right-sided abdominal pain. Began around 3 days ago. It is dull. Has had decreased appetite. States also feels as if there is reflux. No nausea vomiting or diarrhea. No blood in the stool. No fevers. Pain not worsened by eating. No radiation. States he has not had previous surgery on his abdomen. No sick contacts. He did have some constipation after eating poorly but states that a week ago he took a laxative and cleared everything up.  Past Medical History:  Diagnosis Date  . Cancer [facility])    colon cancer  . Dental caries   . Diabetes mellitus, type II ([place]) 01/2012  . Hyperlipidemia   . Influenza vaccine refused 01/18/2013  . Obesity   . Pneumococcal vaccine refused 01/18/2013  . Scoliosis   . Tobacco use   . Vision impairment    left, due to prior trauma    Patient Active Problem List   Diagnosis Date Noted  . Type II or unspecified type diabetes mellitus without mention of complication, uncontrolled 01/18/2013  . Obesity, unspecified 01/18/2013    Past Surgical History:  Procedure Laterality Date  . COLONOSCOPY W/ POLYPECTOMY    . EYE SURGERY  1996   due to trauma, left eye       Home Medications    Prior to Admission medications   Medication Sig Start Date End Date Taking? Authorizing Provider  Multiple Vitamins-Minerals (MULTIVITAMIN ADULT) TABS Take 1 tablet by mouth daily.   Yes Historical Provider, MD  atorvastatin (LIPITOR) 20 MG tablet  Take 1 tablet (20 mg total) by mouth daily. Patient not taking: Reported on 09/30/2015 03/18/13   Camelia Eng Tysinger, PA-C  metFORMIN (GLUCOPHAGE) 1000 MG tablet Take 1 tablet (1,000 mg total) by mouth 2 (two) times daily with a meal. Patient not taking: Reported on 09/30/2015 01/18/13   Camelia Eng Tysinger, PA-C  oxyCODONE-acetaminophen (PERCOCET/ROXICET) 5-325 MG tablet Take 1-2 tablets by mouth every 6 (six) hours as needed for severe pain. 09/30/15   Davonna Belling, MD  Testosterone (ANDROGEL) 20.25 MG/1.25GM (1.62%) GEL Place 2 Squirts onto the skin every morning. Patient not taking: Reported on 09/30/2015 01/21/13   Carlena Hurl, PA-C    Family History Family History  Problem Relation Age of Onset  . Diabetes Sister   . Other Sister     gastric bypass  . Hyperlipidemia Mother   . Cancer Father 7    prostate cancer  . Stroke Maternal Grandmother   . Heart disease Neg Hx   . Hypertension Neg Hx   . Colon cancer Neg Hx     Social History Social History  Substance Use Topics  . Smoking status: Current Every Day Smoker    Packs/day: 0.25    Years: 20.00    Types: Cigarettes  . Smokeless tobacco: Never Used  . Alcohol use 0.0 oz/week     Comment: once-twice yearly     Allergies   Review of patient's allergies indicates  no known allergies.   Review of Systems Review of Systems  Constitutional: Positive for appetite change and unexpected weight change (patient has lost around 10 pounds over the last few months.states it was from constipation.). Negative for activity change and fever.  Eyes: Negative for pain.  Respiratory: Negative for chest tightness and shortness of breath.   Cardiovascular: Negative for chest pain and leg swelling.  Gastrointestinal: Positive for abdominal pain and constipation. Negative for diarrhea, nausea and vomiting.  Genitourinary: Negative for flank pain.  Musculoskeletal: Negative for back pain and neck stiffness.  Skin: Negative for [name].    Neurological: Negative for weakness, numbness and headaches.  Psychiatric/Behavioral: Negative for behavioral problems.     Physical Exam Updated Vital Signs BP 137/77 (BP Location: Left Arm)   Pulse 95   Temp 99.2 F (37.3 C) (Oral)   Resp 16   SpO2 96%   Physical Exam  Constitutional: He appears well-developed and well-nourished.  HENT:  Head: Normocephalic.  Eyes: Conjunctivae are normal.  Cardiovascular: Normal rate.   Abdominal: Soft. There is tenderness.  Mild tenderness to right side or abdomen more mid to upper abdomen but also some right lower quadrant tenderness. No hernias. No rebound or guarding.  Musculoskeletal: Normal range of motion.  Neurological: He is alert.  Skin: Skin is warm.     ED Treatments / Results  Labs (all labs ordered are listed, but only abnormal results are displayed) Labs Reviewed  COMPREHENSIVE METABOLIC PANEL - Abnormal; Notable for the following:       Result Value   Glucose, Bld 172 (*)    Creatinine, Ser 1.50 (*)    Albumin 3.1 (*)    GFR calc non Af Amer 50 (*)    GFR calc Af Amer 58 (*)    All other components within normal limits  CBC WITH DIFFERENTIAL/PLATELET - Abnormal; Notable for the following:    WBC 15.2 (*)    RBC 3.80 (*)    Hemoglobin 10.2 (*)    HCT 31.3 (*)    Platelets 608 (*)    Neutro Abs 10.3 (*)    Monocytes Absolute 2.3 (*)    All other components within normal limits  URINALYSIS, ROUTINE W REFLEX MICROSCOPIC (NOT AT [facility]) - Abnormal; Notable for the following:    Color, Urine AMBER (*)    APPearance HAZY (*)    Bilirubin Urine SMALL (*)    Protein, ur 30 (*)    Leukocytes, UA SMALL (*)    All other components within normal limits  URINE MICROSCOPIC-ADD ON - Abnormal; Notable for the following:    Squamous Epithelial / LPF 0-5 (*)    Bacteria, UA RARE (*)    All other components within normal limits  URINE CULTURE  LIPASE, BLOOD    EKG  EKG Interpretation None       Radiology Ct Chest  W Contrast  Result Date: 09/30/2015 CLINICAL DATA:  Pulmonary and pleural nodules seen on recent abdomen CT. Left renal mass. EXAM: CT CHEST WITH CONTRAST TECHNIQUE: Multidetector CT imaging of the chest was performed during intravenous contrast administration. CONTRAST:  56mL ISOVUE-300 IOPAMIDOL (ISOVUE-300) INJECTION 61% COMPARISON:  AP CT on 09/30/2015 FINDINGS: Cardiovascular: No significant abnormality. Mediastinum/Nodes: Left suprahilar mass or lymphadenopathy seen measuring 2.5 x 2.8 cm on image 52/2. No pathologically enlarged mediastinal or axillary lymph nodes identified. Lungs/Pleura: Moderate right pleural effusion with multiple enhancing soft tissue nodules in the right pleural space, consistent with pleural metastatic disease and malignant pleural  effusion. Multiple sub-cm pulmonary nodules are also seen within both lungs, highly suspicious for pulmonary metastases. No dominant pulmonary mass identified. No evidence of left-sided pleural effusion. Upper Abdomen: Unremarkable. Musculoskeletal: No suspicious bone lesions identified within the thorax. IMPRESSION: Moderate right pleural effusion with multiple right pleural-based masses, consistent with pleural metastatic disease and malignant pleural effusion. Consider diagnostic thoracentesis for cytology. Multiple bilateral sub-cm pulmonary nodules, highly suspicious for pulmonary metastases. 2.8 cm left suprahilar mass or lymphadenopathy. This favors metastatic lymphadenopathy, with primary lung carcinoma considered less likely. Electronically Signed   By: Earle Gell M.D.   On: 09/30/2015 15:53   Ct Abdomen Pelvis W Contrast  Result Date: 09/30/2015 CLINICAL DATA:  Right upper quadrant pain and tenderness for 1 week. Anorexia. Constipation. EXAM: CT ABDOMEN AND PELVIS WITH CONTRAST TECHNIQUE: Multidetector CT imaging of the abdomen and pelvis was performed using the standard protocol following bolus administration of intravenous contrast.  CONTRAST:  166mL ISOVUE-300 IOPAMIDOL (ISOVUE-300) INJECTION 61% COMPARISON:  None. FINDINGS: Lower chest: A moderate right pleural effusion is seen. There are multiple enhancing soft tissue nodules within the right pleural space, suspicious for malignant pleural effusion. Small bibasilar pulmonary nodule of spleen, largest measuring 10 mm in posterior left lower lobe on image 18/7. These are suspicious for pulmonary metastases Hepatobiliary: No masses or other significant abnormality. Gallbladder is unremarkable. Pancreas: No mass, inflammatory changes, or other significant abnormality. Spleen: Within normal limits in size and appearance. Adrenals/Urinary Tract: Normal adrenal glands. Right kidney is normal in appearance. The left kidney shows an ill-defined heterogeneous masslike opacity seen centrally within the area of the left renal pelvis. This measures 3.3 x 5.1 cm on image 30/2. There is peripheral left renal caliectasis. No evidence of ureteral calculi or dilatation. Unopacified urinary bladder is unremarkable in appearance. Stomach/Bowel: No evidence of obstruction, inflammatory process, or abnormal fluid collections. Normal appendix visualized. Vascular/Lymphatic: No pathologically enlarged lymph nodes. No evidence of abdominal aortic aneurysm. Aortic atherosclerosis noted. Reproductive: No mass or other significant abnormality. Other: None. Musculoskeletal: Several small sclerotic bone lesions are seen involving the right sacrum, bilateral iliac bones and L1 vertebral body, suspicious for sclerotic bone metastases. IMPRESSION: Moderate right pleural effusion with multiple enhancing right pleural soft tissue nodules, consistent with malignant pleural effusion. Bibasilar pulmonary nodules measuring up to 10 mm, suspicious for pulmonary metastases. Chest CT with contrast recommended for further evaluation. 5 cm ill-defined mass in the central left kidney in area of renal pelvis, causing peripheral  caliectasis consistent with obstruction. Differential diagnosis includes renal cell carcinoma, urothelial carcinoma, and renal metastasis. Sclerotic lesions in pelvis and lumbar spine, highly suspicious for bone metastases. Aortic atherosclerosis noted. Electronically Signed   By: Earle Gell M.D.   On: 09/30/2015 14:16    Procedures Procedures (including critical care time)  Medications Ordered in ED Medications  fentaNYL (SUBLIMAZE) injection 50 mcg (50 mcg Intravenous Given 09/30/15 1311)  iopamidol (ISOVUE-300) 61 % injection 100 mL (100 mLs Intravenous Contrast Given 09/30/15 1340)  sodium chloride 0.9 % bolus 500 mL (0 mLs Intravenous Stopped 09/30/15 1615)  iopamidol (ISOVUE-300) 61 % injection 75 mL (60 mLs Intravenous Contrast Given 09/30/15 1516)  oxyCODONE-acetaminophen (PERCOCET/ROXICET) 5-325 MG per tablet 1 tablet (1 tablet Oral Given 09/30/15 1634)     Initial Impression / Assessment and Plan / ED Course  I have reviewed the triage vital signs and the nursing notes.  Pertinent labs & imaging results that were available during my care of the patient were reviewed by me and considered  in my medical decision making (see chart for details).  Clinical Course    Patient with abdominal pain. Found to have likely metastatic cancer. CT scan done after discussion with radiology. Discussed with Dr.  Barbie Banner  from interventional radiology and outpatient thoracentesis arranged for tomorrow. Will give patient pain medicines for home. Results need to go to [employer]  Final Clinical Impressions(s) / ED Diagnoses   Final diagnoses:  Metastatic cancer ([place])    New Prescriptions New Prescriptions   OXYCODONE-ACETAMINOPHEN (PERCOCET/ROXICET) 5-325 MG TABLET    Take 1-2 tablets by mouth every 6 (six) hours as needed for severe pain.     Davonna Belling, MD 09/30/15 [phone]

## 2015-09-30 NOTE — ED Notes (Addendum)
Patient states he has had decreased appetite and RUQ pain since last week.  Patient admits to eating a diet increased in fat because his wife was out of town 2 weeks ago.  Patient also had some constipation last week, but took mag citrate and it "cleaned [him] out."  Patient denies N/V/D and fever, but endorses anorexia over past few days.  Patient tender to palpation RUQ.

## 2015-09-30 NOTE — ED Notes (Signed)
Patient in room laughing with family. I asked patient if he provide urine sample for me at this time and patient stated that he will provide sample when we give him something to drink. RN made aware.

## 2015-10-01 ENCOUNTER — Ambulatory Visit (HOSPITAL_COMMUNITY)
Admission: RE | Admit: 2015-10-01 | Discharge: 2015-10-01 | Disposition: A | Discharge status: Home / Self Care | Payer: Medicaid Other | Source: Ambulatory Visit | Attending: Emergency Medicine | Admitting: Emergency Medicine

## 2015-10-01 ENCOUNTER — Ambulatory Visit (HOSPITAL_COMMUNITY)
Admission: RE | Admit: 2015-10-01 | Discharge: 2015-10-01 | Disposition: A | Discharge status: Home / Self Care | Payer: Medicaid Other | Source: Ambulatory Visit | Attending: Physician Assistant | Admitting: Physician Assistant

## 2015-10-01 ENCOUNTER — Telehealth: Payer: Self-pay | Admitting: Medical

## 2015-10-01 DIAGNOSIS — Z9889 Other specified postprocedural states: Secondary | ICD-10-CM | POA: Insufficient documentation

## 2015-10-01 DIAGNOSIS — J90 Pleural effusion, not elsewhere classified: Secondary | ICD-10-CM

## 2015-10-01 DIAGNOSIS — J948 Other specified pleural conditions: Secondary | ICD-10-CM | POA: Insufficient documentation

## 2015-10-01 LAB — URINE CULTURE: Culture: NO GROWTH

## 2015-10-01 MED ORDER — LIDOCAINE HCL 1 % IJ SOLN
INTRAMUSCULAR | Status: AC
  Filled 2015-10-01: qty 20

## 2015-10-01 NOTE — Telephone Encounter (Signed)
Make him a follow up visit ASAP from ED.  He went to the ED for abdominal pain, has abnormal scan.

## 2015-10-01 NOTE — Procedures (Signed)
Successful US guided right thoracentesis. Yielded 475 ml of pleural fluid. Pt tolerated procedure well. No immediate complications.  Specimen was sent for labs. CXR ordered.  Judie Grieve Caprice Mccaffrey PA-C 10/01/2015 3:42 PM

## 2015-10-02 NOTE — Telephone Encounter (Signed)
He is coming in tomorrow at 9 am for a  30 min Hospital Follow up

## 2015-10-03 ENCOUNTER — Telehealth: Payer: Self-pay | Admitting: Medical

## 2015-10-03 ENCOUNTER — Ambulatory Visit (INDEPENDENT_AMBULATORY_CARE_PROVIDER_SITE_OTHER): Payer: Medicaid Other | Admitting: Medical

## 2015-10-03 ENCOUNTER — Encounter: Payer: Self-pay | Admitting: Medical

## 2015-10-03 VITALS — BP 120/78 | HR 99 | Wt 206.0 lb

## 2015-10-03 DIAGNOSIS — D649 Anemia, unspecified: Secondary | ICD-10-CM

## 2015-10-03 DIAGNOSIS — R9389 Abnormal findings on diagnostic imaging of other specified body structures: Secondary | ICD-10-CM

## 2015-10-03 DIAGNOSIS — R591 Generalized enlarged lymph nodes: Secondary | ICD-10-CM | POA: Diagnosis not present

## 2015-10-03 DIAGNOSIS — R938 Abnormal findings on diagnostic imaging of other specified body structures: Secondary | ICD-10-CM | POA: Diagnosis not present

## 2015-10-03 DIAGNOSIS — Z87891 Personal history of nicotine dependence: Secondary | ICD-10-CM | POA: Diagnosis not present

## 2015-10-03 DIAGNOSIS — Z125 Encounter for screening for malignant neoplasm of prostate: Secondary | ICD-10-CM

## 2015-10-03 DIAGNOSIS — R634 Abnormal weight loss: Secondary | ICD-10-CM

## 2015-10-03 DIAGNOSIS — Z7189 Other specified counseling: Secondary | ICD-10-CM | POA: Insufficient documentation

## 2015-10-03 DIAGNOSIS — Z86010 Personal history of colonic polyps: Secondary | ICD-10-CM | POA: Diagnosis not present

## 2015-10-03 DIAGNOSIS — D72829 Elevated white blood cell count, unspecified: Secondary | ICD-10-CM | POA: Diagnosis not present

## 2015-10-03 DIAGNOSIS — E118 Type 2 diabetes mellitus with unspecified complications: Secondary | ICD-10-CM | POA: Diagnosis not present

## 2015-10-03 DIAGNOSIS — R748 Abnormal levels of other serum enzymes: Secondary | ICD-10-CM | POA: Diagnosis not present

## 2015-10-03 DIAGNOSIS — R7989 Other specified abnormal findings of blood chemistry: Secondary | ICD-10-CM

## 2015-10-03 DIAGNOSIS — R918 Other nonspecific abnormal finding of lung field: Secondary | ICD-10-CM | POA: Insufficient documentation

## 2015-10-03 NOTE — Progress Notes (Signed)
Subjective: Chief Complaint  Patient presents with  . Follow-up    hospital f/up for not eating. said they seen something on lungs and kidney. fluid was drained fron lung. has eaten a little more than he was, is now using the bathroom now.    Here for hospital/emergency dept f/u.  Last visit here 04/2013.  He is accompanied by his wife today.    He went to the ED on 09/30/15 for abdominal pain and constipation, right sided abdominal pain.  He also told ED he has 10 lb weight loss the last few months unintentional from constipation.  He ended up having imaging.  CT chest showed right pleural effusion, multiple right pleural based masses, pulmonary nodules, lymphadenopathy suspicious for metastasis with primary lung carcinoma being less likely and more likely metastases.    CT abdomen and pelvis showed pleural effusion, 5cm ill defined mass in central left kidney, sclerotic lesions in pelvis and lumbar spine.    Thoracentesis was arranged for the following day.    He notes at the ED had scans, was told he had a spot on lungs, fluid in lungs, stone in gall bladder, and spot on kidney.   He notes that on the f/u visit for thoracentesis had 1 L of fluid drained, fluid was look and color of beer.    Thoracentesis results show:  Reactive mesothelial cells   Medical history shows colon cancer history.  He notes with his last colonoscopy 1.5 years ago, there were several polys, 2 cancerous were removed, the rest were benign.   Denies other surgery.  He thinks they told him repeat in 5 years.   He notes quitting tobacco 2 months ago.  Has smoked for 20 years, 0.25 ppd.    Has had hx/o diabetes, but due to loss of job and insurance issues, hasn't been back here since 2015.     Past Medical History:  Diagnosis Date  . Dental caries   . Diabetes mellitus, type II ([place]) 01/2012  . Hyperlipidemia   . Influenza vaccine refused 01/18/2013  . Obesity   . Pneumococcal vaccine refused 01/18/2013  . Scoliosis    . Tobacco use   . Vision impairment    left, due to prior trauma   Past Surgical History:  Procedure Laterality Date  . COLONOSCOPY W/ POLYPECTOMY    . EYE SURGERY  1996   due to trauma, left eye   Current Outpatient Prescriptions on File Prior to Visit  Medication Sig Dispense Refill  . oxyCODONE-acetaminophen (PERCOCET/ROXICET) 5-325 MG tablet Take 1-2 tablets by mouth every 6 (six) hours as needed for severe pain. 10 tablet 0  . atorvastatin (LIPITOR) 20 MG tablet Take 1 tablet (20 mg total) by mouth daily. (Patient not taking: Reported on 09/30/2015) 30 tablet 5  . metFORMIN (GLUCOPHAGE) 1000 MG tablet Take 1 tablet (1,000 mg total) by mouth 2 (two) times daily with a meal. (Patient not taking: Reported on 09/30/2015) 60 tablet 5  . Multiple Vitamins-Minerals (MULTIVITAMIN ADULT) TABS Take 1 tablet by mouth daily.    . Testosterone (ANDROGEL) 20.25 MG/1.25GM (1.62%) GEL Place 2 Squirts onto the skin every morning. (Patient not taking: Reported on 09/30/2015) 1.25 g 2   No current facility-administered medications on file prior to visit.    ROS as in subjective   Objective: BP 120/78   Pulse 99   Wt 206 lb (93.4 kg)   BMI 31.32 kg/m   Wt Readings from Last 3 Encounters:  10/03/15 206 lb (  93.4 kg)  05/04/13 231 lb (104.8 kg)  04/01/13 242 lb 6.4 oz (110 kg)   General appearance: alert, no distress, WD/WN, pleasant AA male Oral cavity: MMM, no lesions Neck: supple, no lymphadenopathy, no thyromegaly, no masses Heart: RRR, normal S1, S2, no murmurs Lungs: CTA bilaterally, no wheezes, rhonchi, or rales No chest wall tenderness or deformity, no supraclavicular lymphadenopathy Abdomen: +bs, soft, non tender, no obvious inguinal node palpable, non distended, no masses, no hepatomegaly, no splenomegaly Back: non tender, nontender to percussion Musculoskeletal: nontender, no swelling, no obvious deformity Extremities: no edema, no cyanosis, no clubbing Pulses: 2+ symmetric,  upper and lower extremities, normal cap refill Neurological: alert, oriented x 3, CN2-12 intact, strength normal upper extremities and lower extremities, sensation normal throughout, DTRs 2+ throughout, no cerebellar signs, gait normal Psychiatric: normal affect, behavior normal, pleasant  Rectal: Prostate WNL, occult negative stool, there is a divet at top of gluteal cleft from prior pilon dial cyst and right buttock adjacent to gluteal crease with 2 crusted lesions, likely from recent small boils, seem to be healing GU: normal male, no mass, no deformity   Assessment: Encounter Diagnoses  Name Primary?  . Lymphadenopathy Yes  . Loss of weight   . Pulmonary nodules   . Type 2 diabetes mellitus with complication, without long-term current use of insulin ([place])   . Creatinine elevation   . Screening for prostate cancer   . Abnormal CT of the chest   . Anemia, unspecified   . Leukocytosis   . History of colonic polyps   . Former smoker      CMP Latest Ref Rng & Units 09/30/2015 01/18/2013 03/29/2012  Glucose 65 - 99 mg/dL 172(H) 121(H) 102(H)  BUN 6 - 20 mg/dL 20 15 19   Creatinine 0.61 - 1.24 mg/dL 1.50(H) 1.06 1.00  Sodium 135 - 145 mmol/L 135 140 141  Potassium 3.5 - 5.1 mmol/L 3.9 4.2 4.4  Chloride 101 - 111 mmol/L 102 102 106  CO2 22 - 32 mmol/L 23 27 28   Calcium 8.9 - 10.3 mg/dL 9.0 9.8 9.3  Total Protein 6.5 - 8.1 g/dL 7.8 7.3 6.8  Total Bilirubin 0.3 - 1.2 mg/dL 1.1 0.6 0.5  Alkaline Phos 38 - 126 U/L 123 103 81  AST 15 - 41 U/L 22 11 11   ALT 17 - 63 U/L 39 19 13   CBC Latest Ref Rng & Units 09/30/2015 01/18/2013 03/29/2012  WBC 4.0 - 10.5 K/uL 15.2(H) 13.4(H) 9.6  Hemoglobin 13.0 - 17.0 g/dL 10.2(L) 15.1 14.3  Hematocrit 39.0 - 52.0 % 31.3(L) 44.5 42.2  Platelets 150 - 400 K/uL 608(H) 294 285   CT chest: IMPRESSION: Moderate right pleural effusion with multiple right pleural-based masses, consistent with pleural metastatic disease and malignant pleural effusion.  Consider diagnostic thoracentesis for cytology.  Multiple bilateral sub-cm pulmonary nodules, highly suspicious for pulmonary metastases.  2.8 cm left suprahilar mass or lymphadenopathy. This favors metastatic lymphadenopathy, with primary lung carcinoma considered less likely.   Electronically Signed   By: Earle Gell M.D.   On: 09/30/2015 15:53   CT abdomen/Pelvis IMPRESSION: Moderate right pleural effusion with multiple enhancing right pleural soft tissue nodules, consistent with malignant pleural effusion. Bibasilar pulmonary nodules measuring up to 10 mm, suspicious for pulmonary metastases. Chest CT with contrast recommended for further evaluation.  5 cm ill-defined mass in the central left kidney in area of renal pelvis, causing peripheral caliectasis consistent with obstruction. Differential diagnosis includes renal cell carcinoma, urothelial carcinoma, and renal  metastasis.  Sclerotic lesions in pelvis and lumbar spine, highly suspicious for bone metastases.  Aortic atherosclerosis noted.   Electronically Signed   By: Earle Gell M.D.   On: 09/30/2015 14:16  Plan: Reviewed the recent ED visit, CT chest, abdomen, pelvis, and labs.  Reviewed the recent pathology results from thoracentesis, after speaking with pathologist.  discussed the findings that could be worrisome for tumor or possibly other cause, sarcoidosis or other, but given the weight loss, CT findings, prior hx/o tubular adenoma of colon, smoking history, tumor/cancer needs to be evaluated.  Additional labs today to help rule out prostate, to get update on diabetes marker, and referral to hematology /oncology.     I answered their questions, spoke to their son at their request by phone as well.  advised they can call if any questions, and we will work to get further answers ASAP.     Spent > 30 minutes face to face with patient in discussion of symptoms, evaluation, plan and recommendations.     Horatio was seen today for follow-up.  Diagnoses and all orders for this visit:  Lymphadenopathy -     CEA -     Ambulatory referral to Hematology / Oncology  Loss of weight -     PSA -     CEA -     Ambulatory referral to Hematology / Oncology  Pulmonary nodules -     PSA -     CEA -     Ambulatory referral to Hematology / Oncology  Type 2 diabetes mellitus with complication, without long-term current use of insulin ([facility]) -     Hemoglobin A1c  Creatinine elevation  Screening for prostate cancer -     PSA  Abnormal CT of the chest  Anemia, unspecified  Leukocytosis  History of colonic polyps  Former smoker

## 2015-10-03 NOTE — Telephone Encounter (Signed)
error 

## 2015-10-04 ENCOUNTER — Other Ambulatory Visit: Payer: Self-pay | Admitting: Medical

## 2015-10-04 ENCOUNTER — Telehealth: Payer: Self-pay | Admitting: Medical

## 2015-10-04 LAB — HEMOGLOBIN A1C
HEMOGLOBIN A1C: 7.3 % — AB (ref <5.7)
Mean Plasma Glucose: 163 mg/dL

## 2015-10-04 LAB — PSA: PSA: 0.6 ng/mL (ref <=4.0)

## 2015-10-04 LAB — CEA: CEA: <0.5 ng/mL

## 2015-10-04 MED ORDER — METFORMIN HCL 500 MG PO TABS: 500.0000 mg | ORAL_TABLET | Freq: Every day | ORAL | 11 refills | Status: DC

## 2015-10-04 MED ORDER — HYDROCODONE-ACETAMINOPHEN 5-325 MG PO TABS: 1.0000 | ORAL_TABLET | Freq: Four times a day (QID) | ORAL | 0 refills | Status: DC | PRN

## 2015-10-04 MED ORDER — CLONAZEPAM 0.5 MG PO TABS: ORAL_TABLET | ORAL | 0 refills | Status: DC

## 2015-10-04 NOTE — Telephone Encounter (Signed)
Pt's wife called and stated that he needs something for his nerves. He is not sleeping either. Pt also needs a refill on his pain medication. He only 3 left. Pt uses [facility] on [address] and can be reached at [phone].

## 2015-10-04 NOTE — Telephone Encounter (Signed)
informed pt that his rx was ready

## 2015-10-04 NOTE — Telephone Encounter (Signed)
rx for short term use of pain medication and short term as needed use to calm nerves for clonazepam up front for pickup

## 2015-10-04 NOTE — Progress Notes (Signed)
I spoke to patient this morning.   I arranged an appointment with [facility] resident clinic for 3:30pm today.  He is aware.    Please send my last OV note, labs, recent CTs to [facility], attn: Resident Clinic 3:30pm appt today!  Given his current labs, kidney marker, kidney mass, I'd rather have him just use Metformin 500mg  once daily for now.   I sent to pharmacy.     Keep in touch with [facility] Oncology about that appt too, ASAP.

## 2015-10-04 NOTE — Progress Notes (Signed)
Printed documents and faxing this morning

## 2015-10-05 ENCOUNTER — Other Ambulatory Visit: Payer: Self-pay | Admitting: Medical

## 2015-10-05 ENCOUNTER — Telehealth: Payer: Self-pay

## 2015-10-05 MED ORDER — CIPROFLOXACIN HCL 500 MG PO TABS: 500.0000 mg | ORAL_TABLET | Freq: Two times a day (BID) | ORAL | 0 refills | Status: DC

## 2015-10-05 NOTE — Telephone Encounter (Signed)
Pt is aware.  

## 2015-10-05 NOTE — Telephone Encounter (Signed)
For the past two nights pt has had awful sweating and nausea. Not feeling well at all and has a fever of 100. BP is elevated as well. Audelia Acton stated for them to call Urology and if they have not heard back from them at lunch to call us back .

## 2015-10-05 NOTE — Telephone Encounter (Signed)
Called pt he said at night he will get a fever and break out in the cold sweats. No fever now. No headaches, congestion, painful urination. Stays nauseous and can not eat.

## 2015-10-05 NOTE — Telephone Encounter (Signed)
I sent Cipro antibiotic  in the event of urinary /kidney infection, but this is really a stab in the dark.  Not sure what he and the urologist discussed, but this is only because of fever.   If worse over weekend, call or go to hospital.

## 2015-10-10 ENCOUNTER — Telehealth: Payer: Self-pay | Admitting: Oncology

## 2015-10-10 ENCOUNTER — Ambulatory Visit (HOSPITAL_BASED_OUTPATIENT_CLINIC_OR_DEPARTMENT_OTHER): Payer: Medicaid Other | Admitting: Oncology

## 2015-10-10 ENCOUNTER — Other Ambulatory Visit: Payer: Self-pay | Admitting: Medical

## 2015-10-10 VITALS — BP 141/73 | HR 114 | Temp 100.4°F | Resp 18 | Ht 69.0 in | Wt 204.8 lb

## 2015-10-10 DIAGNOSIS — R9389 Abnormal findings on diagnostic imaging of other specified body structures: Secondary | ICD-10-CM

## 2015-10-10 DIAGNOSIS — R918 Other nonspecific abnormal finding of lung field: Secondary | ICD-10-CM | POA: Diagnosis not present

## 2015-10-10 DIAGNOSIS — N2889 Other specified disorders of kidney and ureter: Secondary | ICD-10-CM | POA: Diagnosis not present

## 2015-10-10 DIAGNOSIS — R509 Fever, unspecified: Secondary | ICD-10-CM

## 2015-10-10 DIAGNOSIS — R109 Unspecified abdominal pain: Secondary | ICD-10-CM

## 2015-10-10 DIAGNOSIS — K5900 Constipation, unspecified: Secondary | ICD-10-CM

## 2015-10-10 NOTE — Telephone Encounter (Signed)
This was rx'd by Audelia Acton on 10/05/15 for #10 BID. Patient went to oncologist today and still had a fever. He was told by his oncologist to continue on the cipro and contact his PCP for a refill. Is this okay?

## 2015-10-10 NOTE — Telephone Encounter (Signed)
AVS REPORT AND APPT SCHD GIVEN PER 08/23 LOS. °

## 2015-10-10 NOTE — Progress Notes (Signed)
Reason for Referral: Kidney mass and pulmonary nodules.   HPI: 55 year old gentleman native of [place] where he lived the majority of his life. He is a gentleman with history of obesity, hyperlipidemia and diabetes as well as smoking history. He was in his usual state of health until about 2 months ago where he started developing anorexia, weight loss and right upper quadrant pain. He also developed symptoms of constipation and poor his report lost close to 30 pounds. He was seen in the emergency department on 09/30/2015 and at that time he had a CT scan chest abdomen and pelvis for workup. His imaging studies revealed 5 cm ill-defined mass in the central left kidney and area of the renal pelvis. The differential diagnosis include renal cell carcinoma versus urothelial carcinoma. Sclerotic lesions in the pelvis and lumbar spine highly suspicious for bony metastasis were noted. CT scan of the chest showed moderate left pleural effusion with multiple right pleural based masses. Multiple bilateral simple pulmonary nodules were noted. 2.8 cm left suprahilar mass likely metastatic adenopathy. He underwent thoracentesis on 09/30/2015 with the cytology revealed no malignant cells. He was referred to me for further evaluation regarding these findings. Clinically, he reports he is feeling reasonably fair. He is prescribed codon which she takes infrequently and took one in the last 24 hours. Pain appears to have subsided at this time. He does move his bowels regularly and did not hematochezia or melena. He does not report any hematuria. His energy is low and have reported excessive fatigue. He is not able to work at this last 10 days. His performance status remains reasonable at this time and still able to drive and attends to activities of daily living but are limited because of excessive fatigue.  He denied any headaches, blurry vision, syncope or seizures. He does report fevers but no chills. His appetite have  declined and did report weight loss. He does not report any chest pain, palpitation, orthopnea, leg edema or dyspnea on exertion. He denied any cough, wheezing or hemoptysis. He does not report any nausea, vomiting or early satiety. He did report constipation which have resolved. He denied any diarrhea. He does not report any frequency, urgency or hesitancy. He does not report any skeletal complaints. Remaining review of system is unremarkable.   Past Medical History:  Diagnosis Date  . Dental caries   . Diabetes mellitus, type II ([place]) 01/2012  . Hyperlipidemia   . Influenza vaccine refused 01/18/2013  . Obesity   . Pneumococcal vaccine refused 01/18/2013  . Scoliosis   . Tobacco use   . Vision impairment    left, due to prior trauma  :  Past Surgical History:  Procedure Laterality Date  . COLONOSCOPY W/ POLYPECTOMY    . EYE SURGERY  1996   due to trauma, left eye  :   Current Outpatient Prescriptions:  .  ciprofloxacin (CIPRO) 500 MG tablet, Take 1 tablet (500 mg total) by mouth 2 (two) times daily., Disp: 10 tablet, Rfl: 0 .  clonazePAM (KLONOPIN) 0.5 MG tablet, 1 tablet po 1-2 times daily prn anxiety, caution due to sedation, Disp: 15 tablet, Rfl: 0 .  HYDROcodone-acetaminophen (NORCO/VICODIN) 5-325 MG tablet, Take 1 tablet by mouth every 6 (six) hours as needed for moderate pain., Disp: 15 tablet, Rfl: 0 .  metFORMIN (GLUCOPHAGE) 500 MG tablet, Take 1 tablet (500 mg total) by mouth daily with breakfast. (Patient not taking: Reported on 10/10/2015), Disp: 30 tablet, Rfl: 11:  No Known Allergies:  Family  History  Problem Relation Age of Onset  . Diabetes Sister   . Other Sister     gastric bypass  . Hyperlipidemia Mother   . Diabetes Mother   . Cancer Father 20    prostate cancer  . Stroke Maternal Grandmother   . Heart disease Neg Hx   . Hypertension Neg Hx   . Colon cancer Neg Hx   :  Social History   Social History  . Marital status: Married    Spouse name: N/A   . Number of children: N/A  . Years of education: N/A   Occupational History  . Not on file.   Social History Main Topics  . Smoking status: Former Smoker    Packs/day: 0.25    Years: 20.00    Types: Cigarettes  . Smokeless tobacco: Never Used  . Alcohol use 0.0 oz/week     Comment: once-twice yearly  . Drug use: No  . Sexual activity: Not on file   Other Topics Concern  . 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  Exam: Blood pressure (!) 141/73, pulse (!) 114, temperature (!) 100.4 F (38 C), temperature source Oral, resp. rate 18, height 5\' 9"  (1.753 m), weight 204 lb 12.8 oz (92.9 kg), SpO2 98 %. General appearance: alert and cooperative Head: Normocephalic, without obvious abnormality Throat: lips, mucosa, and tongue normal; teeth and gums normal Neck: no adenopathy Back: negative Resp: clear to auscultation bilaterally Chest wall: no tenderness Cardio: regular rate and rhythm, S1, S2 normal, no murmur, click, rub or gallop GI: soft, non-tender; bowel sounds normal; no masses,  no organomegaly Extremities: extremities normal, atraumatic, no cyanosis or edema  CBC    Component Value Date/Time   WBC 15.2 (H) 09/30/2015 1117   RBC 3.80 (L) 09/30/2015 1117   HGB 10.2 (L) 09/30/2015 1117   HCT 31.3 (L) 09/30/2015 1117   PLT 608 (H) 09/30/2015 1117   MCV 82.4 09/30/2015 1117   MCH 26.8 09/30/2015 1117   MCHC 32.6 09/30/2015 1117   RDW 13.2 09/30/2015 1117   LYMPHSABS 2.4 09/30/2015 1117   MONOABS 2.3 (H) 09/30/2015 1117   EOSABS 0.2 09/30/2015 1117   BASOSABS 0.0 09/30/2015 1117      Chemistry      Component Value Date/Time   NA 135 09/30/2015 1117   K 3.9 09/30/2015 1117   CL 102 09/30/2015 1117   CO2 23 09/30/2015 1117   BUN 20 09/30/2015 1117   CREATININE 1.50 (H) 09/30/2015 1117   CREATININE 1.06 01/18/2013 0937       Component Value Date/Time   CALCIUM 9.0 09/30/2015 1117   ALKPHOS 123 09/30/2015 1117   AST 22 09/30/2015 1117   ALT 39 09/30/2015 1117   BILITOT 1.1 09/30/2015 1117      Dg Chest 1 View  Result Date: 10/01/2015 CLINICAL DATA:  Right pleural effusion.  Status post thoracentesis. EXAM: CHEST 1 VIEW COMPARISON:  Chest CT on 09/30/2015 FINDINGS: Tiny right pleural effusion appears decreased in size compared to previous CT. No pneumothorax visualized. Multiple pleural-based nodular opacities seen at the right lung base, as better appreciated on recent CT. No evidence of pulmonary consolidation or edema. Heart size and mediastinal contours are within normal limits. IMPRESSION: Tiny right pleural effusion has decreased compared with previous CT. No pneumothorax visualized. Pleural-based nodular opacities in right  lung base noted, as better demonstrated on recent chest CT. Electronically Signed   By: Earle Gell M.D.   On: 10/01/2015 15:15   Ct Chest W Contrast  Result Date: 09/30/2015 CLINICAL DATA:  Pulmonary and pleural nodules seen on recent abdomen CT. Left renal mass. EXAM: CT CHEST WITH CONTRAST TECHNIQUE: Multidetector CT imaging of the chest was performed during intravenous contrast administration. CONTRAST:  23mL ISOVUE-300 IOPAMIDOL (ISOVUE-300) INJECTION 61% COMPARISON:  AP CT on 09/30/2015 FINDINGS: Cardiovascular: No significant abnormality. Mediastinum/Nodes: Left suprahilar mass or lymphadenopathy seen measuring 2.5 x 2.8 cm on image 52/2. No pathologically enlarged mediastinal or axillary lymph nodes identified. Lungs/Pleura: Moderate right pleural effusion with multiple enhancing soft tissue nodules in the right pleural space, consistent with pleural metastatic disease and malignant pleural effusion. Multiple sub-cm pulmonary nodules are also seen within both lungs, highly suspicious for pulmonary metastases. No dominant pulmonary mass identified. No evidence of left-sided pleural  effusion. Upper Abdomen: Unremarkable. Musculoskeletal: No suspicious bone lesions identified within the thorax. IMPRESSION: Moderate right pleural effusion with multiple right pleural-based masses, consistent with pleural metastatic disease and malignant pleural effusion. Consider diagnostic thoracentesis for cytology. Multiple bilateral sub-cm pulmonary nodules, highly suspicious for pulmonary metastases. 2.8 cm left suprahilar mass or lymphadenopathy. This favors metastatic lymphadenopathy, with primary lung carcinoma considered less likely. Electronically Signed   By: Earle Gell M.D.   On: 09/30/2015 15:53   Ct Abdomen Pelvis W Contrast  Result Date: 09/30/2015 CLINICAL DATA:  Right upper quadrant pain and tenderness for 1 week. Anorexia. Constipation. EXAM: CT ABDOMEN AND PELVIS WITH CONTRAST TECHNIQUE: Multidetector CT imaging of the abdomen and pelvis was performed using the standard protocol following bolus administration of intravenous contrast. CONTRAST:  119mL ISOVUE-300 IOPAMIDOL (ISOVUE-300) INJECTION 61% COMPARISON:  None. FINDINGS: Lower chest: A moderate right pleural effusion is seen. There are multiple enhancing soft tissue nodules within the right pleural space, suspicious for malignant pleural effusion. Small bibasilar pulmonary nodule of spleen, largest measuring 10 mm in posterior left lower lobe on image 18/7. These are suspicious for pulmonary metastases Hepatobiliary: No masses or other significant abnormality. Gallbladder is unremarkable. Pancreas: No mass, inflammatory changes, or other significant abnormality. Spleen: Within normal limits in size and appearance. Adrenals/Urinary Tract: Normal adrenal glands. Right kidney is normal in appearance. The left kidney shows an ill-defined heterogeneous masslike opacity seen centrally within the area of the left renal pelvis. This measures 3.3 x 5.1 cm on image 30/2. There is peripheral left renal caliectasis. No evidence of ureteral calculi  or dilatation. Unopacified urinary bladder is unremarkable in appearance. Stomach/Bowel: No evidence of obstruction, inflammatory process, or abnormal fluid collections. Normal appendix visualized. Vascular/Lymphatic: No pathologically enlarged lymph nodes. No evidence of abdominal aortic aneurysm. Aortic atherosclerosis noted. Reproductive: No mass or other significant abnormality. Other: None. Musculoskeletal: Several small sclerotic bone lesions are seen involving the right sacrum, bilateral iliac bones and L1 vertebral body, suspicious for sclerotic bone metastases. IMPRESSION: Moderate right pleural effusion with multiple enhancing right pleural soft tissue nodules, consistent with malignant pleural effusion. Bibasilar pulmonary nodules measuring up to 10 mm, suspicious for pulmonary metastases. Chest CT with contrast recommended for further evaluation. 5 cm ill-defined mass in the central left kidney in area of renal pelvis, causing peripheral caliectasis consistent with obstruction. Differential diagnosis includes renal cell carcinoma, urothelial carcinoma, and renal metastasis. Sclerotic lesions in pelvis and lumbar spine, highly suspicious for bone metastases. Aortic atherosclerosis noted. Electronically Signed   By: Earle Gell M.D.   On:  09/30/2015 14:16   US Thoracentesis Asp Pleural Space W/img Guide    Assessment and Plan:   55 year old gentleman with the following issues:  1. 5 cm mass in the central left kidney noted on a CT scan on 09/30/2015. CT scan of the chest showed pulmonary nodules as well as pleural effusion. 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 The differential diagnosis including urothelial carcinoma of the renal pelvis, metastatic renal carcinoma, small cell cancer of the genitourinary tract. Benign etiologies are extremely unlikely.  From a management standpoint, the first step is to obtain tissue biopsy. Risks and benefits  of this procedure was reviewed today with the patient and his family. Complications include bleeding, infection among others were reviewed. This step is essential to establishing the diagnosis given that his previous cytology was nondiagnostic.  Once the biopsy is completed, the exact histology will be determined and the treatment approach would be that time. Regardless of the etiology of his cancer, we are dealing with an advanced malignancy. He will not be candidate for surgical therapy and likely will need palliative systemic therapy. The therapy will be in the form of multi agent systemic chemotherapy in case of urothelial cancer or small cell cancer versus oral targeted therapy if we're dealing with renal cell carcinoma.  He appears to be quite symptomatic from his disease and likely his symptoms will get worse if not treated promptly. I have asked for an urgent evaluation by interventional radiology for a tissue biopsy in the near future. He will return for a visit after completing the biopsy to discuss the next step. I will also obtain an MRI of the brain to complete staging workup.  2. 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 Biopsying the lung nodule would be the next step if kidney biopsy could not be completed.  3. Abdominal pain: Seems to be managed properly with hydrocodone.  4. Constipation: Appears to have resolved at this time. Continue to encourage him to use stool softeners as long as he is using narcotic analgesia.  5. Fever: Likely related to tumor spread rather a true infectious process. Supportive management with Tylenol would be my recommendation at this time.  6. Follow-up: Will be in the immediate future after completing the biopsy.

## 2015-10-11 ENCOUNTER — Other Ambulatory Visit: Payer: Self-pay | Admitting: Oncology

## 2015-10-11 ENCOUNTER — Other Ambulatory Visit: Payer: Self-pay | Admitting: Radiology

## 2015-10-11 DIAGNOSIS — R918 Other nonspecific abnormal finding of lung field: Secondary | ICD-10-CM

## 2015-10-12 ENCOUNTER — Encounter (HOSPITAL_COMMUNITY): Payer: Self-pay

## 2015-10-12 ENCOUNTER — Ambulatory Visit (HOSPITAL_COMMUNITY): Admission: RE | Admit: 2015-10-12 | Payer: Self-pay | Source: Ambulatory Visit

## 2015-10-12 ENCOUNTER — Ambulatory Visit (HOSPITAL_COMMUNITY)
Admission: RE | Admit: 2015-10-12 | Discharge: 2015-10-12 | Disposition: A | Discharge status: Home / Self Care | Payer: Medicaid Other | Source: Ambulatory Visit | Attending: Oncology | Admitting: Oncology

## 2015-10-12 DIAGNOSIS — R9389 Abnormal findings on diagnostic imaging of other specified body structures: Secondary | ICD-10-CM

## 2015-10-12 DIAGNOSIS — N2889 Other specified disorders of kidney and ureter: Secondary | ICD-10-CM | POA: Diagnosis not present

## 2015-10-12 DIAGNOSIS — E119 Type 2 diabetes mellitus without complications: Secondary | ICD-10-CM | POA: Insufficient documentation

## 2015-10-12 DIAGNOSIS — E785 Hyperlipidemia, unspecified: Secondary | ICD-10-CM | POA: Insufficient documentation

## 2015-10-12 DIAGNOSIS — R938 Abnormal findings on diagnostic imaging of other specified body structures: Secondary | ICD-10-CM | POA: Insufficient documentation

## 2015-10-12 DIAGNOSIS — Z87891 Personal history of nicotine dependence: Secondary | ICD-10-CM | POA: Insufficient documentation

## 2015-10-12 DIAGNOSIS — C782 Secondary malignant neoplasm of pleura: Secondary | ICD-10-CM | POA: Insufficient documentation

## 2015-10-12 DIAGNOSIS — Z7984 Long term (current) use of oral hypoglycemic drugs: Secondary | ICD-10-CM | POA: Diagnosis not present

## 2015-10-12 DIAGNOSIS — Z79899 Other long term (current) drug therapy: Secondary | ICD-10-CM | POA: Diagnosis not present

## 2015-10-12 DIAGNOSIS — R918 Other nonspecific abnormal finding of lung field: Secondary | ICD-10-CM | POA: Insufficient documentation

## 2015-10-12 LAB — CBC
HCT: 27.1 % — ABNORMAL LOW (ref 39.0–52.0)
Hemoglobin: 8.5 g/dL — ABNORMAL LOW (ref 13.0–17.0)
MCH: 25.9 pg — AB (ref 26.0–34.0)
MCHC: 31.4 g/dL (ref 30.0–36.0)
MCV: 82.6 fL (ref 78.0–100.0)
Platelets: 613 10*3/uL — ABNORMAL HIGH (ref 150–400)
RBC: 3.28 MIL/uL — AB (ref 4.22–5.81)
RDW: 13.6 % (ref 11.5–15.5)
WBC: 18.9 10*3/uL — AB (ref 4.0–10.5)

## 2015-10-12 LAB — APTT: APTT: 41 s — AB (ref 24–36)

## 2015-10-12 LAB — GLUCOSE, CAPILLARY: GLUCOSE-CAPILLARY: 165 mg/dL — AB (ref 65–99)

## 2015-10-12 LAB — PROTIME-INR
INR: 1.42
PROTHROMBIN TIME: 17.4 s — AB (ref 11.4–15.2)

## 2015-10-12 MED ORDER — MIDAZOLAM HCL 2 MG/2ML IJ SOLN
INTRAMUSCULAR | Status: AC | PRN
  Administered 2015-10-12: 1 mg via INTRAVENOUS

## 2015-10-12 MED ORDER — SODIUM CHLORIDE 0.9 % IV SOLN
INTRAVENOUS | Status: AC | PRN
  Administered 2015-10-12: 10 mL/h via INTRAVENOUS

## 2015-10-12 MED ORDER — FENTANYL CITRATE (PF) 100 MCG/2ML IJ SOLN
INTRAMUSCULAR | Status: AC
  Filled 2015-10-12: qty 2

## 2015-10-12 MED ORDER — FENTANYL CITRATE (PF) 100 MCG/2ML IJ SOLN
INTRAMUSCULAR | Status: AC | PRN
  Administered 2015-10-12: 50 ug via INTRAVENOUS

## 2015-10-12 MED ORDER — MIDAZOLAM HCL 2 MG/2ML IJ SOLN
INTRAMUSCULAR | Status: AC
  Filled 2015-10-12: qty 2

## 2015-10-12 MED ORDER — SODIUM CHLORIDE 0.9 % IV SOLN: INTRAVENOUS | Status: DC

## 2015-10-12 MED ORDER — HYDROCODONE-ACETAMINOPHEN 5-325 MG PO TABS: 1.0000 | ORAL_TABLET | ORAL | Status: DC | PRN

## 2015-10-12 MED ORDER — LIDOCAINE HCL 1 % IJ SOLN
INTRAMUSCULAR | Status: AC
  Filled 2015-10-12: qty 20

## 2015-10-12 NOTE — Sedation Documentation (Signed)
02 d/c 

## 2015-10-12 NOTE — Sedation Documentation (Signed)
Dr Vernard Gambles in to talk to patient and wife and mother.

## 2015-10-12 NOTE — Sedation Documentation (Signed)
Patient is resting comfortably. 

## 2015-10-12 NOTE — Procedures (Signed)
CT core R pleural mass 18g x3 to surg path] No complication No blood loss. See complete dictation in [facility].

## 2015-10-12 NOTE — H&P (Signed)
Chief Complaint: Patient was seen in consultation today for right pleural based mass biopsy at the request of Shadad,Firas N  Referring Physician(s): Wyatt Portela  Supervising Physician: Arne Cleveland  Patient Status: Outpatient  History of Present Illness: David [name] is a 55 y.o. male   Pt with RUQ pain; wt loss; loss of appetite and loss of energy for weeks/months PMD works up pt CT 8/13:  IMPRESSION: Moderate right pleural effusion with multiple enhancing right pleural soft tissue nodules, consistent with malignant pleural effusion. Bibasilar pulmonary nodules measuring up to 10 mm, suspicious for pulmonary metastases. Chest CT with contrast recommended for further evaluation. 5 cm ill-defined mass in the central left kidney in area of renal pelvis, causing peripheral caliectasis consistent with obstruction. Differential diagnosis includes renal cell carcinoma, urothelial carcinoma, and renal metastasis. Sclerotic lesions in pelvis and lumbar spine, highly suspicious for bone metastases.  Thoracentesis 10/01/15:  Diagnosis PLEURAL FLUID,RIGHT (SPECIMEN 1 OF 1, COLLECTED ON 10/01/15) BENIGN. REACTIVE MESOTHELIAL CELLS PRESENT.  Referred to Dr Alen Blew Requesting tissue diagnosis for planning and treatment  Imaging reviewed with Dr Vernard Gambles Now scheduled for right pleural based mass biopsy  WBC 18.9 today  Past Medical History:  Diagnosis Date  . Dental caries   . Diabetes mellitus, type II ([place]) 01/2012  . Hyperlipidemia   . Influenza vaccine refused 01/18/2013  . Obesity   . Pneumococcal vaccine refused 01/18/2013  . Scoliosis   . Tobacco use   . Vision impairment    left, due to prior trauma    Past Surgical History:  Procedure Laterality Date  . COLONOSCOPY W/ POLYPECTOMY    . EYE SURGERY  1996   due to trauma, left eye    Allergies: Review of patient's allergies indicates no known allergies.  Medications: Prior to Admission medications     Medication Sig Start Date End Date Taking? Authorizing Provider  ciprofloxacin (CIPRO) 500 MG tablet TAKE ONE TABLET BY MOUTH TWICE DAILY 10/10/15  Yes Denita Lung, MD  clonazePAM (KLONOPIN) 0.5 MG tablet 1 tablet po 1-2 times daily prn anxiety, caution due to sedation 10/04/15  Yes Camelia Eng Tysinger, PA-C  HYDROcodone-acetaminophen (NORCO/VICODIN) 5-325 MG tablet Take 1 tablet by mouth every 6 (six) hours as needed for moderate pain. 10/04/15  Yes Camelia Eng Tysinger, PA-C  metFORMIN (GLUCOPHAGE) 500 MG tablet Take 1 tablet (500 mg total) by mouth daily with breakfast. Patient not taking: Reported on 10/10/2015 10/04/15   Carlena Hurl, PA-C     Family History  Problem Relation Age of Onset  . Diabetes Sister   . Other Sister     gastric bypass  . Hyperlipidemia Mother   . Diabetes Mother   . Cancer Father 62    prostate cancer  . Stroke Maternal Grandmother   . Heart disease Neg Hx   . Hypertension Neg Hx   . Colon cancer Neg Hx     Social History   Social History  . Marital status: Married    Spouse name: N/A  . Number of children: N/A  . Years of education: N/A   Social History Main Topics  . Smoking status: Former Smoker    Packs/day: 0.25    Years: 20.00    Types: Cigarettes  . Smokeless tobacco: Never Used  . Alcohol use 0.0 oz/week     Comment: once-twice yearly  . Drug use: No  . Sexual activity: Not Asked   Other Topics Concern  . 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  All other systems are negative.  Review of Systems  Constitutional: Positive for appetite change, fatigue, fever and unexpected weight change. Negative for activity change.  Respiratory: Negative for cough and shortness of breath.   Gastrointestinal: Positive for abdominal pain. Negative for  diarrhea, nausea and vomiting.  Musculoskeletal: Negative for gait problem.  Neurological: Negative for weakness.  Psychiatric/Behavioral: Negative for behavioral problems and confusion.    Vital Signs: BP (!) 143/98   Pulse (!) 117   Temp 98.3 F (36.8 C) (Oral)   Resp 18   Ht 5\' 9"  (1.753 m)   Wt 204 lb (92.5 kg)   SpO2 100%   BMI 30.13 kg/m   Physical Exam  Constitutional: He is oriented to person, place, and time.  Cardiovascular: Normal rate, regular rhythm and normal heart sounds.   Pulmonary/Chest: Effort normal and breath sounds normal. He has no wheezes.  Abdominal: Soft. Bowel sounds are normal. There is no tenderness.  Musculoskeletal: Normal range of motion.  Neurological: He is alert and oriented to person, place, and time.  Skin: Skin is warm and dry.  Psychiatric: He has a normal mood and affect. His behavior is normal. Judgment and thought content normal.  Nursing note and vitals reviewed.   Mallampati Score:  MD Evaluation Airway: WNL Heart: WNL Abdomen: WNL Chest/ Lungs: WNL ASA  Classification: 3 Mallampati/Airway Score: One  Imaging: Dg Chest 1 View  Result Date: 10/01/2015 CLINICAL DATA:  Right pleural effusion.  Status post thoracentesis. EXAM: CHEST 1 VIEW COMPARISON:  Chest CT on 09/30/2015 FINDINGS: Tiny right pleural effusion appears decreased in size compared to previous CT. No pneumothorax visualized. Multiple pleural-based nodular opacities seen at the right lung base, as better appreciated on recent CT. No evidence of pulmonary consolidation or edema. Heart size and mediastinal contours are within normal limits. IMPRESSION: Tiny right pleural effusion has decreased compared with previous CT. No pneumothorax visualized. Pleural-based nodular opacities in right lung base noted, as better demonstrated on recent chest CT. Electronically Signed   By: Earle Gell M.D.   On: 10/01/2015 15:15   Ct Chest W Contrast  Result Date: 09/30/2015 CLINICAL  DATA:  Pulmonary and pleural nodules seen on recent abdomen CT. Left renal mass. EXAM: CT CHEST WITH CONTRAST TECHNIQUE: Multidetector CT imaging of the chest was performed during intravenous contrast administration. CONTRAST:  16mL ISOVUE-300 IOPAMIDOL (ISOVUE-300) INJECTION 61% COMPARISON:  AP CT on 09/30/2015 FINDINGS: Cardiovascular: No significant abnormality. Mediastinum/Nodes: Left suprahilar mass or lymphadenopathy seen measuring 2.5 x 2.8 cm on image 52/2. No pathologically enlarged mediastinal or axillary lymph nodes identified. Lungs/Pleura: Moderate right pleural effusion with multiple enhancing soft tissue nodules in the right pleural space, consistent with pleural metastatic disease and malignant pleural effusion. Multiple sub-cm pulmonary nodules are also seen within both lungs, highly suspicious for pulmonary metastases. No dominant pulmonary mass identified. No evidence of left-sided pleural effusion. Upper Abdomen: Unremarkable. Musculoskeletal: No suspicious bone lesions identified within the thorax. IMPRESSION: Moderate right pleural effusion with multiple right pleural-based masses, consistent with pleural metastatic disease and malignant pleural effusion. Consider diagnostic thoracentesis for cytology. Multiple bilateral sub-cm pulmonary nodules, highly suspicious for pulmonary metastases. 2.8 cm left suprahilar mass or lymphadenopathy. This favors metastatic lymphadenopathy, with primary lung carcinoma considered less likely. Electronically Signed  By: Earle Gell M.D.   On: 09/30/2015 15:53   Ct Abdomen Pelvis W Contrast  Result Date: 09/30/2015 CLINICAL DATA:  Right upper quadrant pain and tenderness for 1 week. Anorexia. Constipation. EXAM: CT ABDOMEN AND PELVIS WITH CONTRAST TECHNIQUE: Multidetector CT imaging of the abdomen and pelvis was performed using the standard protocol following bolus administration of intravenous contrast. CONTRAST:  146mL ISOVUE-300 IOPAMIDOL (ISOVUE-300)  INJECTION 61% COMPARISON:  None. FINDINGS: Lower chest: A moderate right pleural effusion is seen. There are multiple enhancing soft tissue nodules within the right pleural space, suspicious for malignant pleural effusion. Small bibasilar pulmonary nodule of spleen, largest measuring 10 mm in posterior left lower lobe on image 18/7. These are suspicious for pulmonary metastases Hepatobiliary: No masses or other significant abnormality. Gallbladder is unremarkable. Pancreas: No mass, inflammatory changes, or other significant abnormality. Spleen: Within normal limits in size and appearance. Adrenals/Urinary Tract: Normal adrenal glands. Right kidney is normal in appearance. The left kidney shows an ill-defined heterogeneous masslike opacity seen centrally within the area of the left renal pelvis. This measures 3.3 x 5.1 cm on image 30/2. There is peripheral left renal caliectasis. No evidence of ureteral calculi or dilatation. Unopacified urinary bladder is unremarkable in appearance. Stomach/Bowel: No evidence of obstruction, inflammatory process, or abnormal fluid collections. Normal appendix visualized. Vascular/Lymphatic: No pathologically enlarged lymph nodes. No evidence of abdominal aortic aneurysm. Aortic atherosclerosis noted. Reproductive: No mass or other significant abnormality. Other: None. Musculoskeletal: Several small sclerotic bone lesions are seen involving the right sacrum, bilateral iliac bones and L1 vertebral body, suspicious for sclerotic bone metastases. IMPRESSION: Moderate right pleural effusion with multiple enhancing right pleural soft tissue nodules, consistent with malignant pleural effusion. Bibasilar pulmonary nodules measuring up to 10 mm, suspicious for pulmonary metastases. Chest CT with contrast recommended for further evaluation. 5 cm ill-defined mass in the central left kidney in area of renal pelvis, causing peripheral caliectasis consistent with obstruction. Differential  diagnosis includes renal cell carcinoma, urothelial carcinoma, and renal metastasis. Sclerotic lesions in pelvis and lumbar spine, highly suspicious for bone metastases. Aortic atherosclerosis noted. Electronically Signed   By: Earle Gell M.D.   On: 09/30/2015 14:16   US Thoracentesis Asp Pleural Space W/img Guide  Result Date: 10/01/2015 INDICATION: Pleural effusion and pleural nodules seen on recent CT scan. Request for diagnostic and therapeutic thoracentesis. EXAM: ULTRASOUND GUIDED RIGHT THORACENTESIS MEDICATIONS: 1% Lidocaine. COMPLICATIONS: None immediate. PROCEDURE: An ultrasound guided thoracentesis was thoroughly discussed with the patient and questions answered. The benefits, risks, alternatives and complications were also discussed. The patient understands and wishes to proceed with the procedure. Written consent was obtained. Ultrasound was performed to localize and mark an adequate pocket of fluid in the right chest. The area was then prepped and draped in the normal sterile fashion. 1% Lidocaine was used for local anesthesia. Under ultrasound guidance a 6 Fr Safe-T-Centesis catheter was introduced. Thoracentesis was performed. The catheter was removed and a dressing applied. FINDINGS: A total of approximately 475 ml of clear yellow fluid was removed. Samples were sent to the laboratory as requested by the clinical team. IMPRESSION: Successful ultrasound guided right thoracentesis yielding 475 ml of pleural fluid. Read by:  Gareth Eagle, PA-C Electronically Signed   By: Jerilynn Mages.  Shick M.D.   On: 10/01/2015 15:36    Labs:  CBC:  Recent Labs  09/30/15 1117 10/12/15 0946  WBC 15.2* 18.9*  HGB 10.2* 8.5*  HCT 31.3* 27.1*  PLT 608* 613*    COAGS:  Recent  Labs  10/12/15 0946  INR 1.42  APTT 41*    BMP:  Recent Labs  09/30/15 1117  NA 135  K 3.9  CL 102  CO2 23  GLUCOSE 172*  BUN 20  CALCIUM 9.0  CREATININE 1.50*  GFRNONAA 50*  GFRAA 58*    LIVER FUNCTION  TESTS:  Recent Labs  09/30/15 1117  BILITOT 1.1  AST 22  ALT 39  ALKPHOS 123  PROT 7.8  ALBUMIN 3.1*    TUMOR MARKERS:  Recent Labs  10/03/15 0905  CEA <0.5    Assessment and Plan:  Wt loss; weakness; abd pain Renal mass; B pulmonary nodules; bony lesion on work up Scheduled for biopsy of Rt pleural based nodule for tissue diagnosis Risks and Benefits discussed with the patient including, but not limited to bleeding, infection, damage to adjacent structures or low yield requiring additional tests. All of the patient's questions were answered, patient is agreeable to proceed. Consent signed and in chart.   Thank you for this interesting consult.  I greatly enjoyed meeting Tomasz Child psychotherapist and look forward to participating in their care.  A copy of this report was sent to the requesting provider on this date.  Electronically Signed: Monia Sabal A 10/12/2015, 10:48 AM   I spent a total of  30 Minutes   in face to face in clinical consultation, greater than 50% of which was counseling/coordinating care for right pleural based mass bx

## 2015-10-12 NOTE — Discharge Instructions (Signed)
Needle Biopsy, Care After °Refer to this sheet in the next few weeks. These instructions provide you with information about caring for yourself after your procedure. Your health care provider may also give you more specific instructions. Your treatment has been planned according to current medical practices, but problems sometimes occur. Call your health care provider if you have any problems or questions after your procedure. °WHAT TO EXPECT AFTER THE PROCEDURE °After your procedure, it is common to have soreness, bruising, or mild pain at the biopsy site. This should go away in a few days. °HOME CARE INSTRUCTIONS °· Rest as directed by your health care provider. °· Take medicines only as directed by your health care provider. °· There are many different ways to close and cover the biopsy site, including stitches (sutures), skin glue, and adhesive strips. Follow your health care provider's instructions about: °¨ Biopsy site care. °¨ Bandage (dressing) changes and removal. °¨ Biopsy site closure removal. °· Check your biopsy site every day for signs of infection. Watch for: °¨ Redness, swelling, or pain. °¨ Fluid, blood, or pus. °SEEK MEDICAL CARE IF: °· You have a fever. °· You have redness, swelling, or pain at the biopsy site that lasts longer than a few days. °· You have fluid, blood, or pus coming from the biopsy site. °· You feel nauseous. °· You vomit. °SEEK IMMEDIATE MEDICAL CARE IF: °· You have shortness of breath. °· You have trouble breathing. °· You have chest pain.   °· You feel dizzy or you faint. °· You have bleeding that does not stop with pressure or a bandage. °· You cough up blood. °· You have pain in your abdomen. °  °This information is not intended to replace advice given to you by your health care provider. Make sure you discuss any questions you have with your health care provider. °  °Document Released: 06/20/2014 Document Reviewed: 06/20/2014 °Elsevier Interactive Patient Education ©2016  [employer] ° °

## 2015-10-16 ENCOUNTER — Ambulatory Visit (HOSPITAL_COMMUNITY)
Admission: RE | Admit: 2015-10-16 | Discharge: 2015-10-16 | Disposition: A | Discharge status: Home / Self Care | Payer: Medicaid Other | Source: Ambulatory Visit | Attending: Oncology | Admitting: Oncology

## 2015-10-16 DIAGNOSIS — R918 Other nonspecific abnormal finding of lung field: Secondary | ICD-10-CM | POA: Insufficient documentation

## 2015-10-16 MED ORDER — GADOBENATE DIMEGLUMINE 529 MG/ML IV SOLN
20.0000 mL | Freq: Once | INTRAVENOUS | Status: AC | PRN
  Administered 2015-10-16: 20 mL via INTRAVENOUS

## 2015-10-18 ENCOUNTER — Ambulatory Visit (HOSPITAL_BASED_OUTPATIENT_CLINIC_OR_DEPARTMENT_OTHER): Payer: Medicaid Other | Admitting: Oncology

## 2015-10-18 ENCOUNTER — Telehealth: Payer: Self-pay | Admitting: Oncology

## 2015-10-18 VITALS — BP 134/79 | HR 122 | Temp 100.5°F | Resp 19 | Ht 69.0 in | Wt 197.2 lb

## 2015-10-18 DIAGNOSIS — C659 Malignant neoplasm of unspecified renal pelvis: Secondary | ICD-10-CM

## 2015-10-18 DIAGNOSIS — C642 Malignant neoplasm of left kidney, except renal pelvis: Secondary | ICD-10-CM

## 2015-10-18 DIAGNOSIS — C782 Secondary malignant neoplasm of pleura: Secondary | ICD-10-CM

## 2015-10-18 DIAGNOSIS — C7800 Secondary malignant neoplasm of unspecified lung: Secondary | ICD-10-CM

## 2015-10-18 MED ORDER — MEGESTROL ACETATE 400 MG/10ML PO SUSP: 400.0000 mg | Freq: Two times a day (BID) | ORAL | 0 refills | Status: DC

## 2015-10-18 NOTE — Progress Notes (Signed)
Hematology and Oncology Follow Up Visit  David [name] [id number] Sep 13, 1960 55 y.o. 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 He presented with a 5 cm mass in the central left kidney as well as pulmonary metastasis, nodules and pleural effusion.   Prior Therapy: Status post biopsy obtained on 10/12/2015 which confirmed the presence of carcinoma.  Current therapy: Under evaluation for salvage chemotherapy.  Interim History: David [name] presents today for a follow-up visit. Since the last visit, he underwent staging MRI of the brain which ruled out brain metastasis. He also had a biopsy of a pleural-based nodule and tolerated the procedure well. Overall his health has been declining including poor by mouth intake, hiccups and weight loss. He reports his pain is reasonably controlled with hydrocodone and denied any nausea or vomiting. He denied any change in his bowel habits although he did report discoloration of the color of his stool. He denied any hematochezia or melena.  He denied any headaches, blurry vision, syncope or seizures. He does report fevers but no chills.He does not report any chest pain, palpitation, orthopnea, leg edema or dyspnea on exertion. He denied any cough, wheezing or hemoptysis. He does not report any nausea, vomiting or early satiety. He did report constipation which have resolved. He denied any diarrhea. He does not report any frequency, urgency or hesitancy. He does not report any skeletal complaints. Remaining review of system is unremarkable.   Medications: I have reviewed the patient's current medications.  Current Outpatient Prescriptions  Medication Sig Dispense Refill  . ciprofloxacin (CIPRO) 500 MG tablet TAKE ONE TABLET BY MOUTH TWICE DAILY 14 tablet 0  . clonazePAM (KLONOPIN) 0.5 MG tablet 1 tablet po 1-2 times daily prn anxiety, caution due to sedation 15 tablet 0   . HYDROcodone-acetaminophen (NORCO/VICODIN) 5-325 MG tablet Take 1 tablet by mouth every 6 (six) hours as needed for moderate pain. 15 tablet 0  . megestrol (MEGACE) 400 MG/10ML suspension Take 10 mLs (400 mg total) by mouth 2 (two) times daily. 240 mL 0  . metFORMIN (GLUCOPHAGE) 500 MG tablet Take 1 tablet (500 mg total) by mouth daily with breakfast. (Patient not taking: Reported on 10/10/2015) 30 tablet 11   No current facility-administered medications for this visit.      Allergies: No Known Allergies  Past Medical History, Surgical history, Social history, and Family History were reviewed and updated.   Physical Exam: Blood pressure 134/79, pulse (!) 122, temperature (!) 100.5 F (38.1 C), temperature source Oral, resp. rate 19, height 5\' 9"  (1.753 m), weight 197 lb 3.2 oz (89.4 kg), SpO2 (!) 9 %. ECOG: 1 General appearance: alert and cooperative appeared without distress but chronically ill-appearing. Head: Normocephalic, without obvious abnormality no oral ulcers or thrush. Neck: no adenopathy Lymph nodes: Cervical, supraclavicular, and axillary nodes normal. Heart:regular rate and rhythm, S1, S2 normal, no murmur, click, rub or gallop Lung:chest clear, no wheezing, rales, normal symmetric air entry Abdomin: soft, non-tender, without masses or organomegaly no shifting dullness or ascites. EXT:no erythema, induration, or nodules   Lab Results: Lab Results  Component Value Date   WBC 18.9 (H) 10/12/2015   HGB 8.5 (L) 10/12/2015   HCT 27.1 (L) 10/12/2015   MCV 82.6 10/12/2015   PLT 613 (H) 10/12/2015     Chemistry      Component Value Date/Time   NA 135 09/30/2015 1117   K 3.9 09/30/2015 1117   CL 102 09/30/2015  1117   CO2 23 09/30/2015 1117   BUN 20 09/30/2015 1117   CREATININE 1.50 (H) 09/30/2015 1117   CREATININE 1.06 01/18/2013 0937      Component Value Date/Time   CALCIUM 9.0 09/30/2015 1117   ALKPHOS 123 09/30/2015 1117   AST 22 09/30/2015 1117   ALT 39  09/30/2015 1117   BILITOT 1.1 09/30/2015 1117       Radiological Studies: Mr David [name] [id number] Contrast  Result Date: 10/17/2015 CLINICAL DATA:  Evaluation for brain metastases. EXAM: MRI HEAD WITHOUT AND WITH CONTRAST TECHNIQUE: Multiplanar, multiecho pulse sequences of the brain and surrounding structures were obtained without and with intravenous contrast. CONTRAST:  20 mL MultiHance COMPARISON:  None. FINDINGS: Brain Parenchyma: No acute infarct or intraparenchymal hemorrhage. No abnormal parenchymal contrast enhancement. No focal parenchymal signal abnormality. No mass lesion or midline shift. The major intracranial flow voids are preserved. The midline structures are normal. Ventricles, Sulci and Extra-axial Spaces: Normal for age. No extra-axial collection. Paranasal Sinuses and Mastoids: Inflammatory changes of the left sphenoid sinus. Orbits: Normal. Bones and Soft Tissues: The visualized skull base, calvarium and extracranial soft tissues are normal. IMPRESSION: No intracranial metastatic disease. Electronically Signed   By: Ulyses Jarred M.D.   On: 10/17/2015 03:01     Impression and Plan:  55 year old gentleman with the following issues:  1. 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 He is status post biopsy of a pleural-based nodule which confirmed the presence of carcinoma although further immunohistochemical staining was not performed because of lack of tissue.  These findings were reviewed today with the patient and his family as well as the MRI results which showed no evidence of metastatic disease. I feel that his advanced malignancy is progressing rather rapidly and without immediate treatment he will continue to decline and the opportunity to treat his malignancy may be closing rapidly. Ideally, I would like to repeat a biopsy and obtain more adequate tissue but I think that will delay him further and possibly cause  more morbidity and worsening symptoms.  The role of palliative chemotherapy was discussed today and attempt to decrease his cancer burden and alleviate his symptoms. A combination of carboplatin and Gemzar should be active in the genitourinary tract cancer given the fact that he is not a cisplatin candidate. Complications associated with this chemotherapy includes nausea, vomiting, myelosuppression, worsening anemia, thrombocytopenia and possible hospitalization and rarely severe morbidity and death.  After discussion today he is agreeable to proceed with chemotherapy after chemotherapy education class.  I anticipate starting carboplatin and gemcitabine day 1, gemcitabine day 8 of a 21 day cycle. We will repeat CT scan after 3 cycles of therapy.  If he does not have a reasonable response to therapy, repeat biopsy will be done at that time.  2. IV access: Risks and benefits of Port-A-Cath insertion were reviewed and this will be deferred to a later date after the start of chemotherapy.  3. Nausea prophylaxis: He has prescription for antiemetics available to him.  4. Hiccups: Seems to be manageable with promethazine which is available to him at this time.  5. Weight loss: We have discussed different strategies to boost his appetite and a prescription for Megace was given to the patient. We also discussed strategies to boost his nutritional intake including using boost and ensure.  6. Follow-up: Will be next week to start systemic chemotherapy.   Zola Button, MD 8/31/20174:26 PM

## 2015-10-18 NOTE — Telephone Encounter (Signed)
GAVE PATIENT AVS REPORT AND APPOINTMENTS FOR September AND October  °

## 2015-10-23 ENCOUNTER — Encounter: Payer: Self-pay | Admitting: *Deleted

## 2015-10-23 ENCOUNTER — Telehealth: Payer: Self-pay | Admitting: *Deleted

## 2015-10-23 ENCOUNTER — Other Ambulatory Visit: Payer: Self-pay

## 2015-10-23 ENCOUNTER — Telehealth: Payer: Self-pay | Admitting: Medical

## 2015-10-23 NOTE — Telephone Encounter (Signed)
Per [facility] education rn, patient has no anti-emetic at home. Starts gemzar/carbo 10/24/15. NKDA

## 2015-10-23 NOTE — Telephone Encounter (Signed)
Yes, resume metformin once daily for now.  I had already sent refills at his last visit.  I do want him checking sugars though.   It wouldn't be surprising to see his sugars rise in the short term.   Have him check glucose in the mornings daily, and if seeing numbers regularly over 150, let me know.   I'd like to see him back in a month regarding diabetes specifically, sooner if needed.  I have been reviewing notes from his other doctor's as they come in.  Call out glucometer, lancets, test strips and supplies, checking once daily #100, 11 refills.

## 2015-10-23 NOTE — Telephone Encounter (Signed)
Pt wife called and was wondering If Trashaun needs to start taking metformin again he stopped taking It august 8th, and if you want him to continue taking it he needs a new rx  [place], [place], [place] [address] pt wife can be reached at [phone] with any questions  Informed pt that you had left for the day

## 2015-10-24 ENCOUNTER — Ambulatory Visit: Payer: Self-pay

## 2015-10-24 ENCOUNTER — Other Ambulatory Visit (HOSPITAL_BASED_OUTPATIENT_CLINIC_OR_DEPARTMENT_OTHER): Payer: Medicaid Other

## 2015-10-24 ENCOUNTER — Emergency Department (HOSPITAL_COMMUNITY): Payer: Medicaid Other

## 2015-10-24 ENCOUNTER — Other Ambulatory Visit: Payer: Self-pay

## 2015-10-24 ENCOUNTER — Inpatient Hospital Stay (HOSPITAL_COMMUNITY)
Admission: EM | Admit: 2015-10-24 | Discharge: 2015-10-28 | DRG: 687 | Disposition: A | Discharge status: Home / Self Care | Payer: Medicaid Other | Attending: Internal Medicine | Admitting: Internal Medicine

## 2015-10-24 ENCOUNTER — Encounter (HOSPITAL_COMMUNITY): Payer: Self-pay

## 2015-10-24 DIAGNOSIS — C7800 Secondary malignant neoplasm of unspecified lung: Secondary | ICD-10-CM | POA: Diagnosis present

## 2015-10-24 DIAGNOSIS — D519 Vitamin B12 deficiency anemia, unspecified: Secondary | ICD-10-CM | POA: Diagnosis present

## 2015-10-24 DIAGNOSIS — D509 Iron deficiency anemia, unspecified: Secondary | ICD-10-CM | POA: Diagnosis present

## 2015-10-24 DIAGNOSIS — C642 Malignant neoplasm of left kidney, except renal pelvis: Secondary | ICD-10-CM | POA: Diagnosis present

## 2015-10-24 DIAGNOSIS — C782 Secondary malignant neoplasm of pleura: Secondary | ICD-10-CM | POA: Diagnosis not present

## 2015-10-24 DIAGNOSIS — H547 Unspecified visual loss: Secondary | ICD-10-CM | POA: Diagnosis present

## 2015-10-24 DIAGNOSIS — R531 Weakness: Secondary | ICD-10-CM | POA: Diagnosis present

## 2015-10-24 DIAGNOSIS — D649 Anemia, unspecified: Secondary | ICD-10-CM | POA: Diagnosis not present

## 2015-10-24 DIAGNOSIS — I509 Heart failure, unspecified: Secondary | ICD-10-CM | POA: Diagnosis not present

## 2015-10-24 DIAGNOSIS — E785 Hyperlipidemia, unspecified: Secondary | ICD-10-CM | POA: Diagnosis present

## 2015-10-24 DIAGNOSIS — J910 Malignant pleural effusion: Secondary | ICD-10-CM | POA: Diagnosis present

## 2015-10-24 DIAGNOSIS — Z87891 Personal history of nicotine dependence: Secondary | ICD-10-CM

## 2015-10-24 DIAGNOSIS — R6510 Systemic inflammatory response syndrome (SIRS) of non-infectious origin without acute organ dysfunction: Secondary | ICD-10-CM | POA: Diagnosis present

## 2015-10-24 DIAGNOSIS — D72829 Elevated white blood cell count, unspecified: Secondary | ICD-10-CM | POA: Diagnosis present

## 2015-10-24 DIAGNOSIS — R222 Localized swelling, mass and lump, trunk: Secondary | ICD-10-CM | POA: Diagnosis not present

## 2015-10-24 DIAGNOSIS — Z6830 Body mass index (BMI) 30.0-30.9, adult: Secondary | ICD-10-CM

## 2015-10-24 DIAGNOSIS — N183 Chronic kidney disease, stage 3 (moderate): Secondary | ICD-10-CM | POA: Diagnosis present

## 2015-10-24 DIAGNOSIS — D630 Anemia in neoplastic disease: Secondary | ICD-10-CM | POA: Diagnosis present

## 2015-10-24 DIAGNOSIS — C659 Malignant neoplasm of unspecified renal pelvis: Secondary | ICD-10-CM | POA: Diagnosis not present

## 2015-10-24 DIAGNOSIS — I5022 Chronic systolic (congestive) heart failure: Secondary | ICD-10-CM | POA: Diagnosis present

## 2015-10-24 DIAGNOSIS — R899 Unspecified abnormal finding in specimens from other organs, systems and tissues: Secondary | ICD-10-CM | POA: Diagnosis not present

## 2015-10-24 DIAGNOSIS — E118 Type 2 diabetes mellitus with unspecified complications: Secondary | ICD-10-CM | POA: Diagnosis not present

## 2015-10-24 DIAGNOSIS — R634 Abnormal weight loss: Secondary | ICD-10-CM | POA: Diagnosis present

## 2015-10-24 DIAGNOSIS — E1122 Type 2 diabetes mellitus with diabetic chronic kidney disease: Secondary | ICD-10-CM | POA: Diagnosis present

## 2015-10-24 DIAGNOSIS — R0989 Other specified symptoms and signs involving the circulatory and respiratory systems: Secondary | ICD-10-CM | POA: Diagnosis present

## 2015-10-24 DIAGNOSIS — Z823 Family history of stroke: Secondary | ICD-10-CM | POA: Diagnosis not present

## 2015-10-24 DIAGNOSIS — E119 Type 2 diabetes mellitus without complications: Secondary | ICD-10-CM | POA: Diagnosis present

## 2015-10-24 DIAGNOSIS — E669 Obesity, unspecified: Secondary | ICD-10-CM | POA: Diagnosis present

## 2015-10-24 DIAGNOSIS — J948 Other specified pleural conditions: Secondary | ICD-10-CM

## 2015-10-24 DIAGNOSIS — Z79899 Other long term (current) drug therapy: Secondary | ICD-10-CM

## 2015-10-24 DIAGNOSIS — R630 Anorexia: Secondary | ICD-10-CM | POA: Diagnosis present

## 2015-10-24 DIAGNOSIS — J90 Pleural effusion, not elsewhere classified: Secondary | ICD-10-CM

## 2015-10-24 DIAGNOSIS — R509 Fever, unspecified: Secondary | ICD-10-CM

## 2015-10-24 DIAGNOSIS — I517 Cardiomegaly: Secondary | ICD-10-CM | POA: Diagnosis present

## 2015-10-24 DIAGNOSIS — R000 Tachycardia, unspecified: Secondary | ICD-10-CM | POA: Diagnosis present

## 2015-10-24 DIAGNOSIS — Z9889 Other specified postprocedural states: Secondary | ICD-10-CM

## 2015-10-24 DIAGNOSIS — Z833 Family history of diabetes mellitus: Secondary | ICD-10-CM

## 2015-10-24 DIAGNOSIS — R066 Hiccough: Secondary | ICD-10-CM | POA: Diagnosis present

## 2015-10-24 DIAGNOSIS — Z8042 Family history of malignant neoplasm of prostate: Secondary | ICD-10-CM | POA: Diagnosis not present

## 2015-10-24 LAB — CBC WITH DIFFERENTIAL/PLATELET
BASO%: 0.2 % (ref 0.0–2.0)
BASOS ABS: 0 10*3/uL (ref 0.0–0.1)
BASOS ABS: 0 10*3/uL (ref 0.0–0.1)
BASOS PCT: 0 %
EOS ABS: 0 10*3/uL (ref 0.0–0.5)
EOS ABS: 0 10*3/uL (ref 0.0–0.7)
EOS%: 0.2 % (ref 0.0–7.0)
Eosinophils Relative: 0 %
HCT: 23.8 % — ABNORMAL LOW (ref 38.4–49.9)
HEMATOCRIT: 22.2 % — AB (ref 39.0–52.0)
HGB: 7.7 g/dL — ABNORMAL LOW (ref 13.0–17.1)
Hemoglobin: 7.1 g/dL — ABNORMAL LOW (ref 13.0–17.0)
LYMPH%: 11.8 % — AB (ref 14.0–49.0)
Lymphocytes Relative: 11 %
Lymphs Abs: 2.2 10*3/uL (ref 0.7–4.0)
MCH: 25 pg — ABNORMAL LOW (ref 27.2–33.4)
MCH: 24.7 pg — ABNORMAL LOW (ref 26.0–34.0)
MCHC: 32.4 g/dL (ref 32.0–36.0)
MCHC: 32 g/dL (ref 30.0–36.0)
MCV: 77.3 fL — AB (ref 79.3–98.0)
MCV: 77.1 fL — ABNORMAL LOW (ref 78.0–100.0)
MONO ABS: 2.6 10*3/uL — AB (ref 0.1–1.0)
MONO#: 2.2 10*3/uL — ABNORMAL HIGH (ref 0.1–0.9)
MONO%: 10.4 % (ref 0.0–14.0)
Monocytes Relative: 13 %
NEUT#: 16.3 10*3/uL — ABNORMAL HIGH (ref 1.5–6.5)
NEUT%: 77.4 % — ABNORMAL HIGH (ref 39.0–75.0)
NEUTROS ABS: 15.8 10*3/uL — AB (ref 1.7–7.7)
NEUTROS PCT: 76 %
PLATELETS: 650 10*3/uL — AB (ref 140–400)
Platelets: 670 10*3/uL — ABNORMAL HIGH (ref 150–400)
RBC: 3.08 10*6/uL — AB (ref 4.20–5.82)
RBC: 2.88 MIL/uL — ABNORMAL LOW (ref 4.22–5.81)
RDW: 14.6 % (ref 11.0–14.6)
RDW: 14.6 % (ref 11.5–15.5)
WBC: 21 10*3/uL — AB (ref 4.0–10.3)
WBC: 20.7 10*3/uL — ABNORMAL HIGH (ref 4.0–10.5)
lymph#: 2.5 10*3/uL (ref 0.9–3.3)
nRBC: 0 % (ref 0–0)

## 2015-10-24 LAB — COMPREHENSIVE METABOLIC PANEL
ALK PHOS: 157 U/L — AB (ref 38–126)
ALT: 37 U/L (ref 0–55)
ALT: 33 U/L (ref 17–63)
ANION GAP: 12 meq/L — AB (ref 3–11)
ANION GAP: 8 (ref 5–15)
AST: 17 U/L (ref 5–34)
AST: 18 U/L (ref 15–41)
Albumin: 1.9 g/dL — ABNORMAL LOW (ref 3.5–5.0)
Albumin: 2.3 g/dL — ABNORMAL LOW (ref 3.5–5.0)
Alkaline Phosphatase: 186 U/L — ABNORMAL HIGH (ref 40–150)
BILIRUBIN TOTAL: 0.4 mg/dL (ref 0.3–1.2)
BUN: 16.9 mg/dL (ref 7.0–26.0)
BUN: 19 mg/dL (ref 6–20)
CALCIUM: 8.4 mg/dL — AB (ref 8.9–10.3)
CHLORIDE: 104 meq/L (ref 98–109)
CO2: 19 meq/L — AB (ref 22–29)
CO2: 22 mmol/L (ref 22–32)
CREATININE: 1.27 mg/dL — AB (ref 0.61–1.24)
Calcium: 9.5 mg/dL (ref 8.4–10.4)
Chloride: 103 mmol/L (ref 101–111)
Creatinine: 1.3 mg/dL (ref 0.7–1.3)
EGFR: 74 mL/min/{1.73_m2} — AB (ref >90)
GFR calc non Af Amer: >60 mL/min (ref >60)
GLUCOSE: 284 mg/dL — AB (ref 65–99)
Glucose: 260 mg/dl — ABNORMAL HIGH (ref 70–140)
POTASSIUM: 5.1 meq/L (ref 3.5–5.1)
Potassium: 4.9 mmol/L (ref 3.5–5.1)
Sodium: 135 mEq/L — ABNORMAL LOW (ref 136–145)
Sodium: 133 mmol/L — ABNORMAL LOW (ref 135–145)
TOTAL PROTEIN: 7 g/dL (ref 6.5–8.1)
Total Bilirubin: 0.89 mg/dL (ref 0.20–1.20)
Total Protein: 7.6 g/dL (ref 6.4–8.3)

## 2015-10-24 LAB — GLUCOSE, CAPILLARY
GLUCOSE-CAPILLARY: 221 mg/dL — AB (ref 65–99)
GLUCOSE-CAPILLARY: 261 mg/dL — AB (ref 65–99)

## 2015-10-24 LAB — URINE MICROSCOPIC-ADD ON

## 2015-10-24 LAB — PREPARE RBC (CROSSMATCH)

## 2015-10-24 LAB — URINALYSIS, ROUTINE W REFLEX MICROSCOPIC
Bilirubin Urine: NEGATIVE
Glucose, UA: 250 mg/dL — AB
Ketones, ur: NEGATIVE mg/dL
NITRITE: NEGATIVE
PH: 6.5 (ref 5.0–8.0)
Protein, ur: 30 mg/dL — AB
SPECIFIC GRAVITY, URINE: 1.02 (ref 1.005–1.030)

## 2015-10-24 LAB — BRAIN NATRIURETIC PEPTIDE: B NATRIURETIC PEPTIDE 5: 57.9 pg/mL (ref 0.0–100.0)

## 2015-10-24 LAB — POC OCCULT BLOOD, ED: Fecal Occult Bld: NEGATIVE

## 2015-10-24 LAB — ABO/RH: ABO/RH(D): B POS

## 2015-10-24 LAB — LACTIC ACID, PLASMA: LACTIC ACID, VENOUS: 1.2 mmol/L (ref 0.5–1.9)

## 2015-10-24 MED ORDER — SODIUM CHLORIDE 0.9% FLUSH: 3.0000 mL | Freq: Two times a day (BID) | INTRAVENOUS | Status: DC

## 2015-10-24 MED ORDER — CIPROFLOXACIN HCL 500 MG PO TABS
500.0000 mg | ORAL_TABLET | Freq: Two times a day (BID) | ORAL | Status: DC
  Administered 2015-10-24: 500 mg via ORAL
  Filled 2015-10-24: qty 1

## 2015-10-24 MED ORDER — HYDROCODONE-ACETAMINOPHEN 5-325 MG PO TABS
1.0000 | ORAL_TABLET | Freq: Four times a day (QID) | ORAL | Status: DC | PRN
  Administered 2015-10-25: 1 via ORAL
  Filled 2015-10-24: qty 1

## 2015-10-24 MED ORDER — CHLORPROMAZINE HCL 25 MG PO TABS
25.0000 mg | ORAL_TABLET | Freq: Three times a day (TID) | ORAL | Status: DC | PRN
  Administered 2015-10-24 – 2015-10-26 (×5): 25 mg via ORAL
  Filled 2015-10-24 (×6): qty 1

## 2015-10-24 MED ORDER — ACETAMINOPHEN 500 MG PO TABS
500.0000 mg | ORAL_TABLET | Freq: Once | ORAL | Status: AC
  Administered 2015-10-24: 500 mg via ORAL
  Filled 2015-10-24: qty 1

## 2015-10-24 MED ORDER — FUROSEMIDE 10 MG/ML IJ SOLN
40.0000 mg | Freq: Once | INTRAMUSCULAR | Status: AC
  Administered 2015-10-24: 40 mg via INTRAVENOUS
  Filled 2015-10-24: qty 4

## 2015-10-24 MED ORDER — ACETAMINOPHEN 325 MG PO TABS
650.0000 mg | ORAL_TABLET | Freq: Four times a day (QID) | ORAL | Status: DC | PRN
  Administered 2015-10-24: 650 mg via ORAL
  Filled 2015-10-24: qty 2

## 2015-10-24 MED ORDER — SODIUM CHLORIDE 0.9 % IV SOLN
10.0000 mL/h | Freq: Once | INTRAVENOUS | Status: AC
  Administered 2015-10-24: 10 mL/h via INTRAVENOUS

## 2015-10-24 MED ORDER — VANCOMYCIN HCL IN DEXTROSE 1-5 GM/200ML-% IV SOLN
1000.0000 mg | Freq: Two times a day (BID) | INTRAVENOUS | Status: DC
  Administered 2015-10-25 – 2015-10-26 (×3): 1000 mg via INTRAVENOUS
  Filled 2015-10-24 (×4): qty 200

## 2015-10-24 MED ORDER — MEGESTROL ACETATE 400 MG/10ML PO SUSP
400.0000 mg | Freq: Two times a day (BID) | ORAL | Status: DC
  Administered 2015-10-24 – 2015-10-28 (×8): 400 mg via ORAL
  Filled 2015-10-24 (×8): qty 10

## 2015-10-24 MED ORDER — SODIUM CHLORIDE 0.9 % IV BOLUS (SEPSIS)
1000.0000 mL | Freq: Once | INTRAVENOUS | Status: AC
  Administered 2015-10-24: 1000 mL via INTRAVENOUS

## 2015-10-24 MED ORDER — VANCOMYCIN HCL 10 G IV SOLR
1500.0000 mg | Freq: Once | INTRAVENOUS | Status: AC
  Administered 2015-10-24: 1500 mg via INTRAVENOUS
  Filled 2015-10-24: qty 1500

## 2015-10-24 MED ORDER — CLONAZEPAM 0.5 MG PO TABS: 0.5000 mg | ORAL_TABLET | Freq: Two times a day (BID) | ORAL | Status: DC | PRN

## 2015-10-24 MED ORDER — INSULIN ASPART 100 UNIT/ML ~~LOC~~ SOLN
0.0000 [IU] | Freq: Three times a day (TID) | SUBCUTANEOUS | Status: DC
  Administered 2015-10-24 – 2015-10-25 (×2): 3 [IU] via SUBCUTANEOUS
  Administered 2015-10-25: 5 [IU] via SUBCUTANEOUS
  Administered 2015-10-25: 3 [IU] via SUBCUTANEOUS

## 2015-10-24 MED ORDER — DEXTROSE 5 % IV SOLN
1.0000 g | Freq: Three times a day (TID) | INTRAVENOUS | Status: DC
  Administered 2015-10-24 – 2015-10-25 (×2): 1 g via INTRAVENOUS
  Filled 2015-10-24 (×3): qty 1

## 2015-10-24 NOTE — Progress Notes (Signed)
Pharmacy Antibiotic Note  David [name] is a 55 y.o. male with PMH metastatic renal carcinoma sent on 10/24/2015 from [facility] for weakness and anemia. Reports low-grade fevers since having recent CT-guided Bx of L pleura and thoracentesis, for which patient was originally placed on ciprofloxacin. Pharmacy has been consulted for vancomycin and cefepime dosing to r/o possible infection of pleural space.  Plan:  Vancomycin 1500 mg IV now, then 1000 mg IV q12 hr; goal trough 15-20 mcg/mL  Measure vancomycin trough levels at steady state as indicated  Cefepime 1 g IV q8 hr  Discontinue ciprofloxacin     Temp (24hrs), Avg:99.5 F (37.5 C), Min:98.6 F (37 C), Max:101.7 F (38.7 C)   Recent Labs Lab 10/24/15 0813 10/24/15 0813 10/24/15 1043  WBC 21.0*  --  20.7*  CREATININE  --  1.3 1.27*    Estimated Creatinine Clearance: 71.8 mL/min (by C-G formula based on SCr of 1.27 mg/dL).    No Known Allergies  Antimicrobials this admission: vancomycin 9/6 >>  cefepime 9/6 >>   Dose adjustments this admission: ---  Microbiology results: None ordered    Thank you for allowing pharmacy to be a part of this patient's care.  Reuel Boom, PharmD, BCPS Pager: [phone] 10/24/2015, 5:25 PM

## 2015-10-24 NOTE — Progress Notes (Signed)
Patient presented to treatment area for 1st chemo treatment. Labs, vitals, and assessment obtained and reviewed. Discussed all with Dr. Alen Blew. Dr. Alen Blew advised to send to ED for evaluation to r/o sepsis/GI bleed. Report called to ED charge, escorted patient via [facility] to ED, report given to [facility] and care turned over to same.

## 2015-10-24 NOTE — ED Notes (Signed)
Pt being sent by [facility].  Pt was scheduled to receive first chemo today, but unable to receive treatment d/t weakness and melena.  Hgb 7.7 and WBC 21.  Hx of kidney CA w/ mets to lung and DM.  [facility] staff reports significant weight loss.

## 2015-10-24 NOTE — Telephone Encounter (Signed)
Pt son states that pt is in E.R and fixing to be admitted. He will callback when they get discharged.

## 2015-10-24 NOTE — Progress Notes (Signed)
IP PROGRESS NOTE  Subjective:   Principle Diagnosis: 55 year old gentleman with metastatic carcinoma arising from the left kidney and the renal pelvis. He presented with a 5 cm mass in the central left kidney as well as pulmonary metastasis, nodules and pleural effusion.   Prior Therapy: Status post biopsy obtained on 10/12/2015 which confirmed the presence of carcinoma.  Current therapy: Under evaluation for salvage chemotherapy.  He was hospitalized on 10/24/2015 4 weakness, fever and leukocytosis and evaluation for sepsis.  Objective:  Vital signs in last 24 hours: Temp:  [98.6 F (37 C)-101.7 F (38.7 C)] 101.7 F (38.7 C) (09/06 1627) Pulse Rate:  [102-122] 111 (09/06 1627) Resp:  [16-19] 18 (09/06 1627) BP: (120-138)/(67-85) 120/74 (09/06 1627) SpO2:  [94 %-100 %] 97 % (09/06 1627) Weight change:  Last BM Date: 10/24/15  Intake/Output from previous day: No intake/output data recorded. Alert,  Chronic ill-appearing gentleman without distress. Mouth: mucous membranes moist, pharynx normal without lesions Resp: clear to auscultation bilaterally Cardio: regular rate and rhythm, S1, S2 normal, no murmur, click, rub or gallop GI: soft, non-tender; bowel sounds normal; no masses,  no organomegaly Extremities: extremities normal, atraumatic, no cyanosis or edema  Portacath/PICC-without erythema  Lab Results:  Recent Labs  10/24/15 0813 10/24/15 1043  WBC 21.0* 20.7*  HGB 7.7* 7.1*  HCT 23.8* 22.2*  PLT 650* 670*    BMET  Recent Labs  10/24/15 0813 10/24/15 1043  NA 135* 133*  K 5.1 4.9  CL  --  103  CO2 19* 22  GLUCOSE 260* 284*  BUN 16.9 19  CREATININE 1.3 1.27*  CALCIUM 9.5 8.4*    Studies/Results: Dg Chest 2 View  Result Date: 10/24/2015 CLINICAL DATA:  Unexplained weight loss. EXAM: CHEST  2 VIEW COMPARISON:  CT 10/12/2015.  CT 09/30/2015. FINDINGS: Mediastinum hilar structures are normal. Cardiomegaly with pulmonary vascular prominence and  bilateral interstitial prominence suggesting congestive heart failure. Prior right pleural effusion again noted. No pneumothorax. Thoracic spine scoliosis. IMPRESSION: 1. Findings suggest congestive heart failure bilateral from interstitial edema. 2. Persistent prominent right pleural effusion.  No pneumothorax. Electronically Signed   By: Royal Kunia   On: 10/24/2015 11:11    Medications: I have reviewed the patient's current medications.  Assessment/Plan:   55 year old gentleman with the following:   1. 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 He is status post biopsy of a pleural-based nodule which confirmed the presence of carcinoma although further immunohistochemical staining was not performed because of lack of tissue.   Systemic chemotherapy is to start in the near future once he is medically stable.   2. Leukocytosis:  likely reactive related to his malignancy. I agree with ruling out sepsis.  3. Anemia:  Likely related to his malignancy. I agree with red cell transfusion. No signs of GI bleeding ut follow-up Hemoccult testing would be helpful to ensure that.   4. Disposition: anticipate discharge in the next 24-48 hours if he is medically stable.   LOS: 0 days   [id number] 10/24/2015, 4:52 PM

## 2015-10-24 NOTE — ED Triage Notes (Signed)
Pt with black stools x 2 weeks.  Pt is cancer patient and went to have 1st treatment today.  Hgb 7.7.  Pt more fatigued than normal.  HR 120's.  Brought over by cancer center.

## 2015-10-24 NOTE — ED Notes (Signed)
Bed: [id number] Expected date:  Expected time:  Means of arrival:  Comments: Pt from [facility]

## 2015-10-24 NOTE — ED Provider Notes (Signed)
[facility] DEPT Provider Note   CSN: [id number] Arrival date & time: 10/24/15  [id number]     History   Chief Complaint Chief Complaint  Patient presents with  . Melena  . Abnormal Lab    HPI David [name] is a 55 y.o. male.  HPI Patient presents with concern of weakness. He has a notable history of metastatic renal carcinoma was scheduled for his first chemotherapy session today. He notes that over the past few days he has had percent weakness, dyspnea, without chest pain, nausea, vomiting, but with ongoing anorexia, weight loss, and black stool as well. He was found to have low hemoglobin, sent here for evaluation. Patient denies history of anemia in the past, and until about one month ago, when he initially presented for weakness, was found to have metastatic cancer, he was generally well. He does have mild abdominal diffuse pain, unchanged. History with multiple family members who corroborate the history of present illness.  Past Medical History:  Diagnosis Date  . Dental caries   . Diabetes mellitus, type II ([employer]) 01/2012  . Hyperlipidemia   . Influenza vaccine refused 01/18/2013  . Obesity   . Pneumococcal vaccine refused 01/18/2013  . Scoliosis   . Tobacco use   . Vision impairment    left, due to prior trauma    Patient Active Problem List   Diagnosis Date Noted  . Symptomatic anemia 10/24/2015  . Primary cancer of renal pelvis ([place]) 10/18/2015  . Abnormal CT of the chest 10/03/2015  . Screening for prostate cancer 10/03/2015  . Creatinine elevation 10/03/2015  . Pulmonary nodules 10/03/2015  . Loss of weight 10/03/2015  . Lymphadenopathy 10/03/2015  . History of colonic polyps 10/03/2015  . Leukocytosis 10/03/2015  . Anemia, unspecified 10/03/2015  . Former smoker 10/03/2015  . Type 2 diabetes mellitus with complication, without long-term current use of insulin ([place]) 01/18/2013  . Obesity, unspecified 01/18/2013    Past Surgical History:  Procedure  Laterality Date  . COLONOSCOPY W/ POLYPECTOMY    . EYE SURGERY  1996   due to trauma, left eye       Home Medications    Prior to Admission medications   Medication Sig Start Date End Date Taking? Authorizing Provider  chlorproMAZINE (THORAZINE) 25 MG tablet Take 25 mg by mouth 3 (three) times daily as needed for hiccoughs.   Yes Historical Provider, MD  ciprofloxacin (CIPRO) 500 MG tablet TAKE ONE TABLET BY MOUTH TWICE DAILY 10/10/15  Yes Denita Lung, MD  clonazePAM (KLONOPIN) 0.5 MG tablet 1 tablet po 1-2 times daily prn anxiety, caution due to sedation 10/04/15  Yes Camelia Eng Tysinger, PA-C  HYDROcodone-acetaminophen (NORCO/VICODIN) 5-325 MG tablet Take 1 tablet by mouth every 6 (six) hours as needed for moderate pain. 10/04/15  Yes Camelia Eng Tysinger, PA-C  megestrol (MEGACE) 400 MG/10ML suspension Take 10 mLs (400 mg total) by mouth 2 (two) times daily. 10/18/15  Yes Wyatt Portela, MD  metFORMIN (GLUCOPHAGE) 500 MG tablet Take 1 tablet (500 mg total) by mouth daily with breakfast. Patient not taking: Reported on 10/10/2015 10/04/15   Carlena Hurl, PA-C    Family History Family History  Problem Relation Age of Onset  . Diabetes Sister   . Other Sister     gastric bypass  . Hyperlipidemia Mother   . Diabetes Mother   . Cancer Father 75    prostate cancer  . Stroke Maternal Grandmother   . Heart disease Neg Hx   .  Hypertension Neg Hx   . Colon cancer Neg Hx     Social History Social History  Substance Use Topics  . Smoking status: Former Smoker    Packs/day: 0.25    Years: 20.00    Types: Cigarettes  . Smokeless tobacco: Never Used  . Alcohol use 0.0 oz/week     Comment: once-twice yearly     Allergies   Review of patient's allergies indicates no known allergies.   Review of Systems Review of Systems  Constitutional:       Per HPI, otherwise negative  HENT:       Per HPI, otherwise negative  Respiratory:       Per HPI, otherwise negative  Cardiovascular:        Per HPI, otherwise negative  Gastrointestinal: Positive for abdominal pain. Negative for vomiting.  Endocrine:       Negative aside from HPI  Genitourinary:       Neg aside from HPI   Musculoskeletal:       Per HPI, otherwise negative  Skin: Positive for color change.  Allergic/Immunologic: Positive for immunocompromised state.  Neurological: Positive for weakness. Negative for syncope.     Physical Exam Updated Vital Signs BP 131/85   Pulse 101   Temp 98.6 F (37 C) (Oral)   Resp 18   SpO2 94%   Physical Exam  Constitutional: He is oriented to person, place, and time. He appears well-developed. No distress.  HENT:  Head: Normocephalic and atraumatic.  Eyes: Conjunctivae and EOM are normal.  Cardiovascular: Normal rate and regular rhythm.   Pulmonary/Chest: Effort normal. No stridor. No respiratory distress. He has decreased breath sounds.  Abdominal: He exhibits no distension. There is no tenderness.  Genitourinary: Rectal exam shows no tenderness.  Musculoskeletal: He exhibits no edema.  Neurological: He is alert and oriented to person, place, and time.  Skin: Skin is warm and dry.  Psychiatric: He has a normal mood and affect.  Nursing note and vitals reviewed.  Hemoccult negative, grossly negative  ED Treatments / Results  Labs (all labs ordered are listed, but only abnormal results are displayed) Labs Reviewed  COMPREHENSIVE METABOLIC PANEL - Abnormal; Notable for the following:       Result Value   Sodium 133 (*)    Glucose, Bld 284 (*)    Creatinine, Ser 1.27 (*)    Calcium 8.4 (*)    Albumin 2.3 (*)    Alkaline Phosphatase 157 (*)    All other components within normal limits  CBC WITH DIFFERENTIAL/PLATELET - Abnormal; Notable for the following:    WBC 20.7 (*)    RBC 2.88 (*)    Hemoglobin 7.1 (*)    HCT 22.2 (*)    MCV 77.1 (*)    MCH 24.7 (*)    Platelets 670 (*)    Neutro Abs 15.8 (*)    Monocytes Absolute 2.6 (*)    All other  components within normal limits  BRAIN NATRIURETIC PEPTIDE  OCCULT BLOOD X 1 CARD TO LAB, STOOL  POC OCCULT BLOOD, ED  TYPE AND SCREEN  ABO/RH  PREPARE RBC (CROSSMATCH)    EMR notable for ongoing oncologic evaluation, recent CT scan with metastatic cancer evidence, thoracentesis as well.  Radiology Dg Chest 2 View  Result Date: 10/24/2015 CLINICAL DATA:  Unexplained weight loss. EXAM: CHEST  2 VIEW COMPARISON:  CT 10/12/2015.  CT 09/30/2015. FINDINGS: Mediastinum hilar structures are normal. Cardiomegaly with pulmonary vascular prominence and bilateral interstitial prominence suggesting  congestive heart failure. Prior right pleural effusion again noted. No pneumothorax. Thoracic spine scoliosis. IMPRESSION: 1. Findings suggest congestive heart failure bilateral from interstitial edema. 2. Persistent prominent right pleural effusion.  No pneumothorax. Electronically Signed   By: Marcello Moores  Register   On: 10/24/2015 11:11    Procedures Procedures (including critical care time)  Medications Ordered in ED Medications  0.9 %  sodium chloride infusion (not administered)  sodium chloride 0.9 % bolus 1,000 mL (1,000 mLs Intravenous New Bag/Given 10/24/15 1045)   12:02 PM Patient awake and alert, aware of all findings. With hemoglobin 7.1, the patient will have blood transfusion.   Initial Impression / Assessment and Plan / ED Course  I have reviewed the triage vital signs and the nursing notes.  Pertinent labs & imaging results that were available during my care of the patient were reviewed by me and considered in my medical decision making (see chart for details).  Clinical Course      Final Clinical Impressions(s) / ED Diagnoses   Final diagnoses:  Symptomatic anemia  Pleural effusion   Patient with metastatic renal carcinoma presents with dyspnea, weakness. Patient has demonstration of recurrent pleural effusion, some concern for interstitial edema consistent with heart failure,  the patient has no history of this. In addition, the patient is found to have anemia, and though he is Hemoccult negative, grossly negative, his description of black stool suggests possible GI bleed. With notable decline in hemoglobin, patient required blood transfusion. Given the patient's multiple medical issues required admission for further evaluation and management.  CRITICAL CARE Performed by: Carmin Muskrat Total critical care time: 40 minutes Critical care time was exclusive of separately billable procedures and treating other patients. Critical care was necessary to treat or prevent imminent or life-threatening deterioration. Critical care was time spent personally by me on the following activities: development of treatment plan with patient and/or surrogate as well as nursing, discussions with consultants, evaluation of patient's response to treatment, examination of patient, obtaining history from patient or surrogate, ordering and performing treatments and interventions, ordering and review of laboratory studies, ordering and review of radiographic studies, pulse oximetry and re-evaluation of patient's condition.    Carmin Muskrat, MD 10/24/15 1204

## 2015-10-24 NOTE — Progress Notes (Signed)
Patient temp 101.7.  Dr. Cruzita Lederer notified.  Orders received to hold blood transfusion, administer tylenol per PRN order and blood cultures drawn, abx ordered.  Will carry out orders and continue to monitor patient.

## 2015-10-24 NOTE — H&P (Addendum)
History and Physical    David [name]:[id number] DOB: 1960/11/20 DOA: 10/24/2015  PCP: Crisoforo Oxford, PA-C  Outpatient Specialists: Dr. Alen Blew oncology Patient coming from: home  Chief Complaint: Weakness and shortness of breath  HPI: David [name] is a 55 y.o. 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 Patient has been telling me he has been feeling progressively weak over the last 1-2 weeks, as well as noticed intermittent shortness of breath. He denies any chest pain. He denies any cough or sputum production, he has no palpitations. He has no abdominal pain, nausea, vomiting or diarrhea. He denies any lower extremity swelling. He states that he has lost about 20-30 pounds over the last several months. He denies any bright red blood in his stool, however complains of dark tarry stools. Patient recently had a CT-guided biopsy from the left pleura, as well as had a thoracentesis, and also he had his cancer diagnosis. He states that ever since he got the CT-guided biopsy history running a low-grade temp and a fever up to 100.5 at home. He saw his primary care provider, place patient on ciprofloxacin. He never had dysuria or frequency. Of note, he has been having leukocytosis since diagnosis.  ED Course: In the ED patient's vitals are stable, he has mild sinus tachycardia, his blood work was pertinent for a white count of 20.7, hemoglobin 7.1. Chest x-ray shows cardiomegaly with pulmonary vascular congestion, concerning for CHF. TRH was asked for admission for symptomatically anemia as well as concern for CHF.  Review of Systems: As per HPI otherwise 10 point review of systems negative.   Past Medical History:  Diagnosis Date  . Dental caries   . Diabetes mellitus, type  II ([place]) 01/2012  . Hyperlipidemia   . Influenza vaccine refused 01/18/2013  . Obesity   . Pneumococcal vaccine refused 01/18/2013  . Scoliosis   . Tobacco use   . Vision impairment    left, due to prior trauma    Past Surgical History:  Procedure Laterality Date  . COLONOSCOPY W/ POLYPECTOMY    . EYE SURGERY  1996   due to trauma, left eye     reports that he has quit smoking. His smoking use included Cigarettes. He has a 5.00 pack-year smoking history. He has never used smokeless tobacco. He reports that he drinks alcohol. He reports that he does not use drugs.  No Known Allergies  Family History  Problem Relation Age of Onset  . Diabetes Sister   . Other Sister     gastric bypass  . Hyperlipidemia Mother   . Diabetes Mother   . Cancer Father 2    prostate cancer  . Stroke Maternal Grandmother   . Heart disease Neg Hx   . Hypertension Neg Hx   . Colon cancer Neg Hx     Prior to Admission medications   Medication Sig Start Date End Date Taking? Authorizing Provider  chlorproMAZINE (THORAZINE) 25 MG tablet Take 25 mg by mouth 3 (three) times daily as needed for hiccoughs.   Yes Historical Provider, MD  ciprofloxacin (CIPRO) 500 MG tablet TAKE ONE TABLET BY MOUTH TWICE DAILY 10/10/15  Yes Denita Lung, MD  clonazePAM (KLONOPIN) 0.5 MG tablet 1 tablet po 1-2 times daily prn anxiety, caution due to sedation  10/04/15  Yes Camelia Eng Tysinger, PA-C  HYDROcodone-acetaminophen (NORCO/VICODIN) 5-325 MG tablet Take 1 tablet by mouth every 6 (six) hours as needed for moderate pain. 10/04/15  Yes Camelia Eng Tysinger, PA-C  megestrol (MEGACE) 400 MG/10ML suspension Take 10 mLs (400 mg total) by mouth 2 (two) times daily. 10/18/15  Yes Wyatt Portela, MD  metFORMIN (GLUCOPHAGE) 500 MG tablet Take 1 tablet (500 mg total) by mouth daily with breakfast. Patient not taking: Reported on 10/10/2015 10/04/15   Carlena Hurl, PA-C    Physical Exam: Vitals:   10/24/15 0941 10/24/15 1200  BP:  138/80 131/85  Pulse: 110 101  Resp: 16 18  Temp: 98.6 F (37 C)   TempSrc: Oral   SpO2: 98% 94%      Constitutional: NAD, calm, comfortable Vitals:   10/24/15 0941 10/24/15 1200  BP: 138/80 131/85  Pulse: 110 101  Resp: 16 18  Temp: 98.6 F (37 C)   TempSrc: Oral   SpO2: 98% 94%   Eyes: PERRL, lids and conjunctivae normal ENMT: Mucous membranes are moist. Neck: normal, supple, no masses, no thyromegaly Respiratory: clear to auscultation bilaterally, no wheezing, faint crackles Cardiovascular: Regular rate and rhythm, no murmurs / rubs / gallops. Tachycardic. No extremity edema. 2+ pedal pulses.  Abdomen: no tenderness, no masses palpated. Bowel sounds positive.  Musculoskeletal: no clubbing / cyanosis. Normal muscle tone.  Skin: no rashes, lesions, ulcers. No induration Neurologic: non focal  Psychiatric: Normal judgment and insight. Alert and oriented x 3. Normal mood.   Labs on Admission: I have personally reviewed following labs and imaging studies  CBC:  Recent Labs Lab 10/24/15 0813 10/24/15 1043  WBC 21.0* 20.7*  NEUTROABS 16.3* 15.8*  HGB 7.7* 7.1*  HCT 23.8* 22.2*  MCV 77.3* 77.1*  PLT 650* [id number]*   Basic Metabolic Panel:  Recent Labs Lab 10/24/15 0813 10/24/15 1043  NA 135* 133*  K 5.1 4.9  CL  --  103  CO2 19* 22  GLUCOSE 260* 284*  BUN 16.9 19  CREATININE 1.3 1.27*  CALCIUM 9.5 8.4*   GFR: Estimated Creatinine Clearance: 71.8 mL/min (by C-G formula based on SCr of 1.27 mg/dL). Liver Function Tests:  Recent Labs Lab 10/24/15 0813 10/24/15 1043  AST 17 18  ALT 37 33  ALKPHOS 186* 157*  BILITOT 0.89 0.4  PROT 7.6 7.0  ALBUMIN 1.9* 2.3*   No results for input(s): LIPASE, AMYLASE in the last 168 hours. No results for input(s): AMMONIA in the last 168 hours. Coagulation Profile: No results for input(s): INR, PROTIME in the last 168 hours. Cardiac Enzymes: No results for input(s): CKTOTAL, CKMB, CKMBINDEX, TROPONINI in the last 168  hours. BNP (last 3 results) No results for input(s): PROBNP in the last 8760 hours. HbA1C: No results for input(s): HGBA1C in the last 72 hours. CBG: No results for input(s): GLUCAP in the last 168 hours. Lipid Profile: No results for input(s): CHOL, HDL, LDLCALC, TRIG, CHOLHDL, LDLDIRECT in the last 72 hours. Thyroid Function Tests: No results for input(s): TSH, T4TOTAL, FREET4, T3FREE, THYROIDAB in the last 72 hours. Anemia Panel: No results for input(s): VITAMINB12, FOLATE, FERRITIN, TIBC, IRON, RETICCTPCT in the last 72 hours. Urine analysis:    Component Value Date/Time   COLORURINE AMBER (A) 09/30/2015 1102   APPEARANCEUR HAZY (A) 09/30/2015 1102   LABSPEC 1.021 09/30/2015 1102   PHURINE 5.5 09/30/2015 1102   GLUCOSEU NEGATIVE 09/30/2015 1102   HGBUR NEGATIVE 09/30/2015 1102   BILIRUBINUR SMALL (A) 09/30/2015  [place] 01/18/2013 0859   KETONESUR NEGATIVE 09/30/2015 1102   PROTEINUR 30 (A) 09/30/2015 1102   UROBILINOGEN negative 01/18/2013 0859   UROBILINOGEN 1.0 02/12/2012 1520   NITRITE NEGATIVE 09/30/2015 1102   LEUKOCYTESUR SMALL (A) 09/30/2015 1102   Sepsis Labs: @LABRCNTIP (procalcitonin:4,lacticidven:4) )No results found for this or any previous visit (from the past 240 hour(s)).   Radiological Exams on Admission: Dg Chest 2 View  Result Date: 10/24/2015 CLINICAL DATA:  Unexplained weight loss. EXAM: CHEST  2 VIEW COMPARISON:  CT 10/12/2015.  CT 09/30/2015. FINDINGS: Mediastinum hilar structures are normal. Cardiomegaly with pulmonary vascular prominence and bilateral interstitial prominence suggesting congestive heart failure. Prior right pleural effusion again noted. No pneumothorax. Thoracic spine scoliosis. IMPRESSION: 1. Findings suggest congestive heart failure bilateral from interstitial edema. 2. Persistent prominent right pleural effusion.  No pneumothorax. Electronically Signed   By: Marcello Moores  Register   On: 10/24/2015 11:11    EKG:  Independently reviewed. pending  Assessment/Plan Active Problems:   Type 2 diabetes mellitus with complication, without long-term current use of insulin (HCC)   Leukocytosis   Primary cancer of renal pelvis (HCC)   Symptomatic anemia    Symptomatically anemia - We'll transfuse patient 2 units of packed red blood cells, followed by Lasix, his anemia may be due to the underlying malignancy, his fecal occult was negative in the ED. - closely monitor stools, if further concern for bleeding will need fecal occult x 3  Shortness of breath in the setting of malignant pleural effusion as well as concern for pulmonary vascular congestion - Patient is not hypoxic, he appears comfortable on room air. - He has no lower extremity edema, no JVD, not sure whether this represents true CHF or not, wonder whether this is lymphangitic spread - Obtain 2-D echo for completeness, however BNP is normal - We'll provide 1 dose of IV Lasix as above given the fact that he will get 2 units of packed red blood cells  DM2 - Most recent hemoglobin A1c of 7.3 in August 2017, place patient on sliding scale  Metastatic carcinoma felt to be of renal origin - Will tag Dr. Alen Blew   Sinus tachycardia - Likely in the setting of anemia versus tumor burden - DC telemetry 24 hours if no events  Leukocytosis - Patient had intermittent fevers at home, he ciprofloxacin for unknown site of infection, has 2 days left, continue - Obtain urinalysis - He is afebrile here, no apparent source of infection, closely monitor    Addendum - After arrival to the floor, patient developed a fever. Reevaluated, he looks comfortable, feels like this is been happening to him every other night since he had his thoracentesis. Given relative immunosuppression from extensive  malignancy, we'll cover him broad-spectrum antibiotics and put for ultrasound-guided thoracentesis in the morning to rule out infected pleural effusion    DVT  prophylaxis: SCD  Code Status: Full  Disposition Plan: admit to telemetry Consults called: one  Admission status: inpatient    Marzetta Board, MD [facility] Pager [phone]  If 7PM-7AM, please contact night-coverage [email or web address] Password [email or web address]  10/24/2015, 12:25 PM

## 2015-10-25 ENCOUNTER — Encounter: Payer: Self-pay | Admitting: Oncology

## 2015-10-25 ENCOUNTER — Inpatient Hospital Stay (HOSPITAL_COMMUNITY): Payer: Medicaid Other

## 2015-10-25 ENCOUNTER — Other Ambulatory Visit (HOSPITAL_COMMUNITY): Payer: Self-pay

## 2015-10-25 DIAGNOSIS — R509 Fever, unspecified: Secondary | ICD-10-CM

## 2015-10-25 DIAGNOSIS — R222 Localized swelling, mass and lump, trunk: Secondary | ICD-10-CM

## 2015-10-25 DIAGNOSIS — J948 Other specified pleural conditions: Secondary | ICD-10-CM

## 2015-10-25 DIAGNOSIS — R634 Abnormal weight loss: Secondary | ICD-10-CM

## 2015-10-25 DIAGNOSIS — J90 Pleural effusion, not elsewhere classified: Secondary | ICD-10-CM

## 2015-10-25 LAB — GLUCOSE, SEROUS FLUID: Glucose, Fluid: 248 mg/dL

## 2015-10-25 LAB — CBC
HEMATOCRIT: 28.6 % — AB (ref 39.0–52.0)
HEMOGLOBIN: 9.5 g/dL — AB (ref 13.0–17.0)
MCH: 26.5 pg (ref 26.0–34.0)
MCHC: 33.2 g/dL (ref 30.0–36.0)
MCV: 79.7 fL (ref 78.0–100.0)
Platelets: 637 10*3/uL — ABNORMAL HIGH (ref 150–400)
RBC: 3.59 MIL/uL — ABNORMAL LOW (ref 4.22–5.81)
RDW: 15.5 % (ref 11.5–15.5)
WBC: 21.1 10*3/uL — AB (ref 4.0–10.5)

## 2015-10-25 LAB — COMPREHENSIVE METABOLIC PANEL
ALBUMIN: 2.2 g/dL — AB (ref 3.5–5.0)
ALT: 28 U/L (ref 17–63)
AST: 14 U/L — AB (ref 15–41)
Alkaline Phosphatase: 155 U/L — ABNORMAL HIGH (ref 38–126)
Anion gap: 9 (ref 5–15)
BILIRUBIN TOTAL: 0.9 mg/dL (ref 0.3–1.2)
BUN: 21 mg/dL — AB (ref 6–20)
CALCIUM: 8.4 mg/dL — AB (ref 8.9–10.3)
CO2: 20 mmol/L — ABNORMAL LOW (ref 22–32)
Chloride: 105 mmol/L (ref 101–111)
Creatinine, Ser: 1.31 mg/dL — ABNORMAL HIGH (ref 0.61–1.24)
GFR calc Af Amer: >60 mL/min (ref >60)
GFR, EST NON AFRICAN AMERICAN: 59 mL/min — AB (ref >60)
GLUCOSE: 287 mg/dL — AB (ref 65–99)
Potassium: 4.7 mmol/L (ref 3.5–5.1)
Sodium: 134 mmol/L — ABNORMAL LOW (ref 135–145)
TOTAL PROTEIN: 6.7 g/dL (ref 6.5–8.1)

## 2015-10-25 LAB — BODY FLUID CELL COUNT WITH DIFFERENTIAL
LYMPHS FL: 30 %
MONOCYTE-MACROPHAGE-SEROUS FLUID: 42 % — AB (ref 50–90)
Neutrophil Count, Fluid: 28 % — ABNORMAL HIGH (ref 0–25)
WBC FLUID: 1422 uL — AB (ref 0–1000)

## 2015-10-25 LAB — GLUCOSE, CAPILLARY
GLUCOSE-CAPILLARY: 262 mg/dL — AB (ref 65–99)
GLUCOSE-CAPILLARY: 245 mg/dL — AB (ref 65–99)
Glucose-Capillary: 234 mg/dL — ABNORMAL HIGH (ref 65–99)
Glucose-Capillary: 258 mg/dL — ABNORMAL HIGH (ref 65–99)

## 2015-10-25 LAB — GRAM STAIN

## 2015-10-25 LAB — LACTATE DEHYDROGENASE, PLEURAL OR PERITONEAL FLUID: LD FL: 135 U/L — AB (ref 3–23)

## 2015-10-25 MED ORDER — PIPERACILLIN-TAZOBACTAM 3.375 G IVPB
3.3750 g | Freq: Three times a day (TID) | INTRAVENOUS | Status: DC
  Administered 2015-10-25 – 2015-10-28 (×9): 3.375 g via INTRAVENOUS
  Filled 2015-10-25 (×10): qty 50

## 2015-10-25 NOTE — Progress Notes (Addendum)
Inpatient Diabetes Program Recommendations  AACE/ADA: New Consensus Statement on Inpatient Glycemic Control (2015)  Target Ranges:  Prepandial:   less than 140 mg/dL      Peak postprandial:   less than 180 mg/dL (1-2 hours)      Critically ill patients:  140 - 180 mg/dL   Results for [name], David [name] (MRN [id number]) as of 10/25/2015 08:41  Ref. Range 10/24/2015 16:38 10/24/2015 22:08 10/25/2015 07:56  Glucose-Capillary Latest Ref Range: 65 - 99 mg/dL 221 (H) 261 (H) 262 (H)    Admit Weakness/ SOB/ Symptomatic Anemia.   History: DM, Metstatic Kidney Cancer   Home DM Meds: Metformin 500 mg daily (pt not taking)  Current Orders: Novolog Sensitive Correction Scale/ SSI (0-9 units) TID AC      -Note Novolog SSI started last PM.  -AM CBG elevated to 262 mg/dl this AM.     MD- Please consider the following in-hospital insulin adjustments:  1. Start low dose basal insulin for this patient: Levemir 9 units daily (0.1 units/kg dosing)  2. Increase Novolog SSI to Moderate Correction Scale/ SSI (0-15 units) TID AC + HS     --Will follow patient during hospitalization--  Wyn Quaker RN, MSN, CDE Diabetes Coordinator Inpatient Glycemic Control Team Team Pager: [phone] (8a-5p)

## 2015-10-25 NOTE — Procedures (Signed)
Ultrasound-guided diagnostic and therapeutic right thoracentesis performed yielding 980 cc of yellow fluid. No immediate complications. Follow-up chest x-ray pending. The fluid was submitted to the lab for preordered studies.

## 2015-10-25 NOTE — Progress Notes (Addendum)
Pharmacy Antibiotic Note  David [name] is a 55 y.o. male with PMH metastatic renal carcinoma sent on 10/24/2015 from [facility] for weakness and anemia. Reports low-grade fevers since having recent CT-guided Bx of L pleura and thoracentesis, for which patient was originally placed on ciprofloxacin, which has been discontinued. Pharmacy has been consulted for vancomycin and Zosyn dosing to r/o possible infection of pleural space.  Cefepime started instead of Zosyn in error last night.  Plan:  Start Zosyn 3.375g IV q8h (4 hour infusion time). Discontinue Cefepime order.  Continue Vancomycin 1000 mg IV q12 hr; goal trough 15-20 mcg/mL  F/u culture results, SCr, trough levels as indicated, clinical course.   Temp (24hrs), Avg:98.9 F (37.2 C), Min:97.8 F (36.6 C), Max:101.7 F (38.7 C)   Recent Labs Lab 10/24/15 0813 10/24/15 0813 10/24/15 1043 10/24/15 1734 10/25/15 0517  WBC 21.0*  --  20.7*  --  21.1*  CREATININE  --  1.3 1.27*  --  1.31*  LATICACIDVEN  --   --   --  1.2  --     Estimated Creatinine Clearance: 69.6 mL/min (by C-G formula based on SCr of 1.31 mg/dL).    No Known Allergies  Antimicrobials this admission: vancomycin 9/6 >>  cefepime 9/6 >> 9/7 Zosyn 9/7 >>  Dose adjustments this admission: ---  Microbiology results: 9/6 BCx: sent  Thank you for allowing pharmacy to be a part of this patient's care.  Hershal Coria, PharmD, BCPS Pager: [phone] 10/25/2015 7:09 AM

## 2015-10-25 NOTE — Progress Notes (Signed)
Patient's spouse called requesting financial assistance. She states patient was due to start treatment but had complications and was admitted to hospital. Advised her once his treatment plan has been revised, she may contact me and I can review it to see what is available. Also advised her that I would look at insurance to see if copay assistance is available. She states they do not have insurance but have an appointment tomorrow with hospital financial counselor. Advised her that all patients receive a 55% discount for being uninsured and there is an application that can be completed called the [facility] FAA that can determine if he would be eligible for an additional discount and the financial counselor on the hospital side may discuss further at her appointment. Advised her that drug replacement may be an option for treatment if available and at the time of our appointment being scheduled, we would discuss all options and what documents would need to be provided. Patient has my contact name and number for any additional financial questions or concerns.

## 2015-10-25 NOTE — Progress Notes (Signed)
PROGRESS NOTE    David [name]  [id number] DOB: 01-19-1961 DOA: 10/24/2015  PCP: Crisoforo Oxford, PA-C   Brief Narrative:  55 year old male with recently diagnosed metastatic renal cell cancer who was supposed to start chemotherapy on 9/6 and complained of profound weakness at the cancer center. He was found to have a hemoglobin of 7.1 and was therefore transferred to the ER. He did not admit to any blood in stool or history of gastric ulcers. Fecal occult blood negative in the ER. In addition the patient had been admitting to fevers over the past couple of weeks and had been started on Cipro as an outpatient. He continued to have fever and chills and in the ER was noted to have a temperature of 101.7.  Subjective: Feels that this weakness has resolved since receiving the blood transfusion. No complaints of cough or shortness of breath. Has not had any GI symptoms recently including heartburn and nausea vomiting or blood in stool.  Assessment & Plan:   Principal Problem:  Symptomatic anemia -Fecal occult blood negative-anemia likely secondary to acute illness/cancer -Post 2 units of packed red blood cells bringing hemoglobin up from 1.1-9.5 -Check Anemia panel in a.m.  Active Problems: Fever, unspecified- leukocytosis - ? Secondary to underlying cancer -Has been on ciprofloxacin as outpatient for almost 2 weeks without improvement - WBC count elevated (has mild chronic elevation of about 14-15) - now in 20-21 range -Obtaining thoracentesis of right pleural effusion to ensure that he does not have an infection -Thus far fevers have resolved on IV antibiotics  ? Pulmonary edema - on CXR- obtain ECHO    Type 2 diabetes mellitus with complication, without long-term current use of insulin -Continue insulin sliding scale-metformin on hold    Loss of weight - Due to poor appetite-continue Megace    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 - With pulmonary  metastasis, pleural nodules and pleural effusion - Under care of oncology-biopsy obtained on 8/25 confirm presence of cancer -Palliative chemotherapy planned  CKD 2-3 - stable   DVT prophylaxis: SCDs Code Status: Full code Family Communication: wife Disposition Plan: home in 2-3 days Consultants:   none Procedures:    Antimicrobials:  Anti-infectives    Start     Dose/Rate Route Frequency Ordered Stop   10/25/15 1400  piperacillin-tazobactam (ZOSYN) IVPB 3.375 g     3.375 g 12.5 mL/hr over 240 Minutes Intravenous Every 8 hours 10/25/15 0706     10/25/15 0800  vancomycin (VANCOCIN) IVPB 1000 mg/200 mL premix     1,000 mg 200 mL/hr over 60 Minutes Intravenous Every 12 hours 10/24/15 1733     10/24/15 1830  vancomycin (VANCOCIN) 1,500 mg in sodium chloride 0.9 % 500 mL IVPB     1,500 mg 250 mL/hr over 120 Minutes Intravenous  Once 10/24/15 1733 10/24/15 2104   10/24/15 1800  ceFEPIme (MAXIPIME) 1 g in dextrose 5 % 50 mL IVPB  Status:  Discontinued     1 g 100 mL/hr over 30 Minutes Intravenous Every 8 hours 10/24/15 1733 10/25/15 0706   10/24/15 1500  ciprofloxacin (CIPRO) tablet 500 mg  Status:  Discontinued     500 mg Oral 2 times daily 10/24/15 1434 10/24/15 1733       Objective: Vitals:   10/25/15 0045 10/25/15 0120 10/25/15 0137 10/25/15 0425  BP: 106/68 106/65 119/63 107/61  Pulse: 95 95 100 (!) 101  Resp: (!) 23 (!) 22 (!) 22 20  Temp: 98.5  F (36.9 C) 98.6 F (37 C) 98.5 F (36.9 C) 98.8 F (37.1 C)  TempSrc: Oral Oral Oral Oral  SpO2: 98% 98% 96% 97%    Intake/Output Summary (Last 24 hours) at 10/25/15 1222 Last data filed at 10/25/15 0700  Gross per 24 hour  Intake             1372 ml  Output             1275 ml  Net               97 ml   There were no vitals filed for this visit.  Examination: General exam: Appears comfortable  HEENT: PERRLA, oral mucosa moist, no sclera icterus or thrush Respiratory system: Clear to auscultation. Respiratory  effort normal. Cardiovascular system: S1 & S2 heard, RRR.  No murmurs  Gastrointestinal system: Abdomen soft, non-tender, nondistended. Normal bowel sound. No organomegaly Central nervous system: Alert and oriented. No focal neurological deficits. Extremities: No cyanosis, clubbing or edema Skin: No rashes or ulcers Psychiatry:  Mood & affect appropriate.     Data Reviewed: I have personally reviewed following labs and imaging studies  CBC:  Recent Labs Lab 10/24/15 0813 10/24/15 1043 10/25/15 0517  WBC 21.0* 20.7* 21.1*  NEUTROABS 16.3* 15.8*  --   HGB 7.7* 7.1* 9.5*  HCT 23.8* 22.2* 28.6*  MCV 77.3* 77.1* 79.7  PLT 650* 670* [id number]*   Basic Metabolic Panel:  Recent Labs Lab 10/24/15 0813 10/24/15 1043 10/25/15 0517  NA 135* 133* 134*  K 5.1 4.9 4.7  CL  --  103 105  CO2 19* 22 20*  GLUCOSE 260* 284* 287*  BUN 16.9 19 21*  CREATININE 1.3 1.27* 1.31*  CALCIUM 9.5 8.4* 8.4*   GFR: Estimated Creatinine Clearance: 69.6 mL/min (by C-G formula based on SCr of 1.31 mg/dL). Liver Function Tests:  Recent Labs Lab 10/24/15 0813 10/24/15 1043 10/25/15 0517  AST 17 18 14*  ALT 37 33 28  ALKPHOS 186* 157* 155*  BILITOT 0.89 0.4 0.9  PROT 7.6 7.0 6.7  ALBUMIN 1.9* 2.3* 2.2*   No results for input(s): LIPASE, AMYLASE in the last 168 hours. No results for input(s): AMMONIA in the last 168 hours. Coagulation Profile: No results for input(s): INR, PROTIME in the last 168 hours. Cardiac Enzymes: No results for input(s): CKTOTAL, CKMB, CKMBINDEX, TROPONINI in the last 168 hours. BNP (last 3 results) No results for input(s): PROBNP in the last 8760 hours. HbA1C: No results for input(s): HGBA1C in the last 72 hours. CBG:  Recent Labs Lab 10/24/15 1638 10/24/15 2208 10/25/15 0756 10/25/15 1154  GLUCAP 221* 261* 262* 234*   Lipid Profile: No results for input(s): CHOL, HDL, LDLCALC, TRIG, CHOLHDL, LDLDIRECT in the last 72 hours. Thyroid Function Tests: No  results for input(s): TSH, T4TOTAL, FREET4, T3FREE, THYROIDAB in the last 72 hours. Anemia Panel: No results for input(s): VITAMINB12, FOLATE, FERRITIN, TIBC, IRON, RETICCTPCT in the last 72 hours. Urine analysis:    Component Value Date/Time   COLORURINE YELLOW 10/24/2015 1200   APPEARANCEUR CLEAR 10/24/2015 1200   LABSPEC 1.020 10/24/2015 1200   PHURINE 6.5 10/24/2015 1200   GLUCOSEU 250 (A) 10/24/2015 1200   HGBUR MODERATE (A) 10/24/2015 1200   BILIRUBINUR NEGATIVE 10/24/2015 1200   BILIRUBINUR NEG 01/18/2013 0859   KETONESUR NEGATIVE 10/24/2015 1200   PROTEINUR 30 (A) 10/24/2015 1200   UROBILINOGEN negative 01/18/2013 0859   UROBILINOGEN 1.0 02/12/2012 1520   NITRITE NEGATIVE 10/24/2015 1200  LEUKOCYTESUR SMALL (A) 10/24/2015 1200   Sepsis Labs: @LABRCNTIP (procalcitonin:4,lacticidven:4) )No results found for this or any previous visit (from the past 240 hour(s)).       Radiology Studies: Dg Chest 2 View  Result Date: 10/24/2015 CLINICAL DATA:  Unexplained weight loss. EXAM: CHEST  2 VIEW COMPARISON:  CT 10/12/2015.  CT 09/30/2015. FINDINGS: Mediastinum hilar structures are normal. Cardiomegaly with pulmonary vascular prominence and bilateral interstitial prominence suggesting congestive heart failure. Prior right pleural effusion again noted. No pneumothorax. Thoracic spine scoliosis. IMPRESSION: 1. Findings suggest congestive heart failure bilateral from interstitial edema. 2. Persistent prominent right pleural effusion.  No pneumothorax. Electronically Signed   By: Marcello Moores  Register   On: 10/24/2015 11:11      Scheduled Meds: . insulin aspart  0-9 Units Subcutaneous TID WC  . megestrol  400 mg Oral BID  . piperacillin-tazobactam (ZOSYN)  IV  3.375 g Intravenous Q8H  . sodium chloride flush  3 mL Intravenous Q12H  . vancomycin  1,000 mg Intravenous Q12H   Continuous Infusions:    LOS: 1 day    Time spent in minutes: 63    Enoch Moffa, MD [facility] Pager: [email or web address] Password [email or web address] 10/25/2015, 12:22 PM

## 2015-10-26 ENCOUNTER — Inpatient Hospital Stay (HOSPITAL_COMMUNITY): Payer: Medicaid Other

## 2015-10-26 DIAGNOSIS — I509 Heart failure, unspecified: Secondary | ICD-10-CM

## 2015-10-26 DIAGNOSIS — Z9889 Other specified postprocedural states: Secondary | ICD-10-CM

## 2015-10-26 DIAGNOSIS — R509 Fever, unspecified: Secondary | ICD-10-CM

## 2015-10-26 DIAGNOSIS — R6510 Systemic inflammatory response syndrome (SIRS) of non-infectious origin without acute organ dysfunction: Secondary | ICD-10-CM

## 2015-10-26 DIAGNOSIS — R899 Unspecified abnormal finding in specimens from other organs, systems and tissues: Secondary | ICD-10-CM

## 2015-10-26 LAB — BASIC METABOLIC PANEL
ANION GAP: 11 (ref 5–15)
BUN: 18 mg/dL (ref 6–20)
CALCIUM: 8.9 mg/dL (ref 8.9–10.3)
CO2: 22 mmol/L (ref 22–32)
CREATININE: 1.33 mg/dL — AB (ref 0.61–1.24)
Chloride: 101 mmol/L (ref 101–111)
GFR, EST NON AFRICAN AMERICAN: 58 mL/min — AB (ref >60)
Glucose, Bld: 251 mg/dL — ABNORMAL HIGH (ref 65–99)
Potassium: 4.8 mmol/L (ref 3.5–5.1)
Sodium: 134 mmol/L — ABNORMAL LOW (ref 135–145)

## 2015-10-26 LAB — RETICULOCYTES
RBC.: 3.64 MIL/uL — ABNORMAL LOW (ref 4.22–5.81)
Retic Count, Absolute: 58.2 10*3/uL (ref 19.0–186.0)
Retic Ct Pct: 1.6 % (ref 0.4–3.1)

## 2015-10-26 LAB — TYPE AND SCREEN
ABO/RH(D): B POS
ANTIBODY SCREEN: NEGATIVE
UNIT DIVISION: 0
Unit division: 0

## 2015-10-26 LAB — PH, BODY FLUID: pH, Body Fluid: 7.9

## 2015-10-26 LAB — CBC
HEMATOCRIT: 29 % — AB (ref 39.0–52.0)
Hemoglobin: 9.8 g/dL — ABNORMAL LOW (ref 13.0–17.0)
MCH: 26.5 pg (ref 26.0–34.0)
MCHC: 33.8 g/dL (ref 30.0–36.0)
MCV: 78.4 fL (ref 78.0–100.0)
PLATELETS: 661 10*3/uL — AB (ref 150–400)
RBC: 3.7 MIL/uL — ABNORMAL LOW (ref 4.22–5.81)
RDW: 15.2 % (ref 11.5–15.5)
WBC: 23.2 10*3/uL — AB (ref 4.0–10.5)

## 2015-10-26 LAB — FOLATE: Folate: 7.4 ng/mL (ref >5.9)

## 2015-10-26 LAB — IRON AND TIBC
IRON: 10 ug/dL — AB (ref 45–182)
SATURATION RATIOS: 7 % — AB (ref 17.9–39.5)
TIBC: 151 ug/dL — ABNORMAL LOW (ref 250–450)
UIBC: 141 ug/dL

## 2015-10-26 LAB — FERRITIN: Ferritin: 2045 ng/mL — ABNORMAL HIGH (ref 24–336)

## 2015-10-26 LAB — VITAMIN B12: VITAMIN B 12: 156 pg/mL — AB (ref 180–914)

## 2015-10-26 LAB — ECHOCARDIOGRAM COMPLETE
Height: 69 in
Weight: 3320 oz

## 2015-10-26 LAB — GLUCOSE, CAPILLARY
GLUCOSE-CAPILLARY: 319 mg/dL — AB (ref 65–99)
Glucose-Capillary: 224 mg/dL — ABNORMAL HIGH (ref 65–99)
Glucose-Capillary: 306 mg/dL — ABNORMAL HIGH (ref 65–99)
Glucose-Capillary: 287 mg/dL — ABNORMAL HIGH (ref 65–99)

## 2015-10-26 LAB — MRSA PCR SCREENING: MRSA BY PCR: NEGATIVE

## 2015-10-26 LAB — LACTATE DEHYDROGENASE: LDH: 118 U/L (ref 98–192)

## 2015-10-26 LAB — PROTEIN, TOTAL: TOTAL PROTEIN: 7.2 g/dL (ref 6.5–8.1)

## 2015-10-26 MED ORDER — INSULIN ASPART 100 UNIT/ML ~~LOC~~ SOLN
0.0000 [IU] | Freq: Every day | SUBCUTANEOUS | Status: DC
  Administered 2015-10-26 – 2015-10-27 (×2): 3 [IU] via SUBCUTANEOUS

## 2015-10-26 MED ORDER — INSULIN ASPART 100 UNIT/ML ~~LOC~~ SOLN
0.0000 [IU] | Freq: Three times a day (TID) | SUBCUTANEOUS | Status: DC
  Administered 2015-10-26: 5 [IU] via SUBCUTANEOUS
  Administered 2015-10-26 (×2): 11 [IU] via SUBCUTANEOUS
  Administered 2015-10-27 – 2015-10-28 (×5): 8 [IU] via SUBCUTANEOUS

## 2015-10-26 MED ORDER — CARVEDILOL 3.125 MG PO TABS
3.1250 mg | ORAL_TABLET | Freq: Two times a day (BID) | ORAL | Status: DC
  Administered 2015-10-26 – 2015-10-28 (×4): 3.125 mg via ORAL
  Filled 2015-10-26 (×4): qty 1

## 2015-10-26 MED ORDER — CYANOCOBALAMIN 1000 MCG/ML IJ SOLN
1000.0000 ug | Freq: Every day | INTRAMUSCULAR | Status: AC
  Administered 2015-10-26 – 2015-10-28 (×3): 1000 ug via SUBCUTANEOUS
  Filled 2015-10-26 (×3): qty 1

## 2015-10-26 MED ORDER — NA FERRIC GLUC CPLX IN SUCROSE 12.5 MG/ML IV SOLN
125.0000 mg | Freq: Once | INTRAVENOUS | Status: AC
  Administered 2015-10-26: 125 mg via INTRAVENOUS
  Filled 2015-10-26: qty 10

## 2015-10-26 MED ORDER — FERROUS SULFATE 325 (65 FE) MG PO TABS
325.0000 mg | ORAL_TABLET | Freq: Two times a day (BID) | ORAL | Status: DC
Start: 2015-10-26 — End: 2015-10-28
  Administered 2015-10-26 – 2015-10-28 (×4): 325 mg via ORAL
  Filled 2015-10-26 (×4): qty 1

## 2015-10-26 MED ORDER — SODIUM CHLORIDE 0.9 % IV SOLN
25.0000 mg | Freq: Once | INTRAVENOUS | Status: AC
  Administered 2015-10-26: 25 mg via INTRAVENOUS
  Filled 2015-10-26: qty 2

## 2015-10-26 NOTE — Progress Notes (Addendum)
PROGRESS NOTE    Danford Bad  LZ:[id number] DOB: November 21, 1960 DOA: 10/24/2015  PCP: Crisoforo Oxford, PA-C   Brief Narrative:  55 year old male with recently diagnosed metastatic renal cell cancer who was supposed to start chemotherapy on 9/6 and complained of profound weakness at the cancer center. He was found to have a hemoglobin of 7.1 and was therefore transferred to the ER. He did not admit to any blood in stool or history of gastric ulcers. Fecal occult blood negative in the ER. In addition the patient had been admitting to fevers over the past couple of weeks and had been started on Cipro as an outpatient. He continued to have fever and chills and in the ER was noted to have a temperature of 101.7.  Subjective: Feels well today. No complaints of cough, fever, chills, nausea, vomiting, diarrhea, dysuria.   Assessment & Plan:   Principal Problem:  Symptomatic anemia- B12 and Iron deficiency  -Fecal occult blood negative-anemia likely secondary to acute illness/cancer -Post 2 units of packed red blood cells bringing hemoglobin up from 7.1 to 9.5 - Anemia panel reveal B12 deficiency and low Iron levels- will start s/c B12 and oral Iron- will give a dose of IV Iron as well.   Active Problems: Fever, unspecified- leukocytosis- tachycardia (SIRS)  And right pleural effusion - ? Secondary to underlying cancer -Has been on ciprofloxacin as outpatient for almost 2 weeks without improvement in fevers - WBC count elevated (has mild chronic elevation of about 14-15) - now in 20-21 range - underwent thoracentesis of right pleural effusion to ensure that he does not have an infection- gram stain is negative- fluid is exudative- await cultures -Thus far fevers have resolved on IV antibiotics- MRSA PCR is negative- will stop Vancomycin- Cont Zosyn today  Pulmonary edema/ chronic systolic CHF - on CXR- obtained ECHO which reveals EF of 45-50% and mild diffuse hypokinesis- poor study due to  tachycardia and tachypnea - recommended to repeat at a later date - based on this ECHO, he needs a B Blocker and ACE I but due to CKD and underlying renal cancer, I will hold off on an ACE I - will start low dose Coreg while watching BP which is currently normal    Type 2 diabetes mellitus with complication, without long-term current use of insulin -Continue insulin sliding scale-metformin on hold    Loss of weight - Due to poor appetite-continue Megace    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, pleural nodules and pleural effusion - Under care of oncology-biopsy obtained on 8/25 confirm presence of cancer -Palliative chemotherapy planned - Dr Alen Blew following  CKD 2-3 - stable   DVT prophylaxis: SCDs Code Status: Full code Family Communication: wife Disposition Plan: home in 2-3 days Consultants:   none Procedures:    Antimicrobials:  Anti-infectives    Start     Dose/Rate Route Frequency Ordered Stop   10/25/15 1400  piperacillin-tazobactam (ZOSYN) IVPB 3.375 g     3.375 g 12.5 mL/hr over 240 Minutes Intravenous Every 8 hours 10/25/15 0706     10/25/15 0800  vancomycin (VANCOCIN) IVPB 1000 mg/200 mL premix     1,000 mg 200 mL/hr over 60 Minutes Intravenous Every 12 hours 10/24/15 1733     10/24/15 1830  vancomycin (VANCOCIN) 1,500 mg in sodium chloride 0.9 % 500 mL IVPB     1,500 mg 250 mL/hr over 120 Minutes Intravenous  Once 10/24/15 1733 10/24/15 2104  10/24/15 1800  ceFEPIme (MAXIPIME) 1 g in dextrose 5 % 50 mL IVPB  Status:  Discontinued     1 g 100 mL/hr over 30 Minutes Intravenous Every 8 hours 10/24/15 1733 10/25/15 0706   10/24/15 1500  ciprofloxacin (CIPRO) tablet 500 mg  Status:  Discontinued     500 mg Oral 2 times daily 10/24/15 1434 10/24/15 1733       Objective: Vitals:   10/25/15 1341 10/25/15 2240 10/26/15 0609 10/26/15 0800  BP: (!) 149/94 125/67 126/62   Pulse: (!) 109 94 72   Resp: 18 20 20      Temp: 98.4 F (36.9 C) 98.1 F (36.7 C) 98 F (36.7 C)   TempSrc: Oral Oral Oral   SpO2: 100% 100% 100%   Weight:   94.1 kg (207 lb 8 oz)   Height:    5\' 9"  (1.753 m)    Intake/Output Summary (Last 24 hours) at 10/26/15 1220 Last data filed at 10/26/15 [id number]  Gross per 24 hour  Intake              300 ml  Output                0 ml  Net              300 ml   Filed Weights   10/26/15 0609  Weight: 94.1 kg (207 lb 8 oz)    Examination: General exam: Appears comfortable  HEENT: PERRLA, oral mucosa moist, no sclera icterus or thrush Respiratory system: Clear to auscultation. Respiratory effort normal. Cardiovascular system: S1 & S2 heard, RRR.  No murmurs  Gastrointestinal system: Abdomen soft, non-tender, nondistended. Normal bowel sound. No organomegaly Central nervous system: Alert and oriented. No focal neurological deficits. Extremities: No cyanosis, clubbing or edema Skin: No rashes or ulcers Psychiatry:  Mood & affect appropriate.     Data Reviewed: I have personally reviewed following labs and imaging studies  CBC:  Recent Labs Lab 10/24/15 0813 10/24/15 1043 10/25/15 0517 10/26/15 0806  WBC 21.0* 20.7* 21.1* 23.2*  NEUTROABS 16.3* 15.8*  --   --   HGB 7.7* 7.1* 9.5* 9.8*  HCT 23.8* 22.2* 28.6* 29.0*  MCV 77.3* 77.1* 79.7 78.4  PLT 650* 670* 637* [id number]*   Basic Metabolic Panel:  Recent Labs Lab 10/24/15 0813 10/24/15 1043 10/25/15 0517 10/26/15 0806  NA 135* 133* 134* 134*  K 5.1 4.9 4.7 4.8  CL  --  103 105 101  CO2 19* 22 20* 22  GLUCOSE 260* 284* 287* 251*  BUN 16.9 19 21* 18  CREATININE 1.3 1.27* 1.31* 1.33*  CALCIUM 9.5 8.4* 8.4* 8.9   GFR: Estimated Creatinine Clearance: 70.3 mL/min (by C-G formula based on SCr of 1.33 mg/dL). Liver Function Tests:  Recent Labs Lab 10/24/15 0813 10/24/15 1043 10/25/15 0517 10/26/15 0806  AST 17 18 14*  --   ALT 37 33 28  --   ALKPHOS 186* 157* 155*  --   BILITOT 0.89 0.4 0.9  --   PROT 7.6  7.0 6.7 7.2  ALBUMIN 1.9* 2.3* 2.2*  --    No results for input(s): LIPASE, AMYLASE in the last 168 hours. No results for input(s): AMMONIA in the last 168 hours. Coagulation Profile: No results for input(s): INR, PROTIME in the last 168 hours. Cardiac Enzymes: No results for input(s): CKTOTAL, CKMB, CKMBINDEX, TROPONINI in the last 168 hours. BNP (last 3 results) No results for input(s): PROBNP in the last 8760 hours. HbA1C:  No results for input(s): HGBA1C in the last 72 hours. CBG:  Recent Labs Lab 10/25/15 1154 10/25/15 1652 10/25/15 2244 10/26/15 0753 10/26/15 1157  GLUCAP 234* 245* 258* 224* 306*   Lipid Profile: No results for input(s): CHOL, HDL, LDLCALC, TRIG, CHOLHDL, LDLDIRECT in the last 72 hours. Thyroid Function Tests: No results for input(s): TSH, T4TOTAL, FREET4, T3FREE, THYROIDAB in the last 72 hours. Anemia Panel:  Recent Labs  10/26/15 0545  VITAMINB12 156*  FOLATE 7.4  FERRITIN 2,045*  TIBC 151*  IRON 10*  RETICCTPCT 1.6   Urine analysis:    Component Value Date/Time   COLORURINE YELLOW 10/24/2015 1200   APPEARANCEUR CLEAR 10/24/2015 1200   LABSPEC 1.020 10/24/2015 1200   PHURINE 6.5 10/24/2015 1200   GLUCOSEU 250 (A) 10/24/2015 1200   HGBUR MODERATE (A) 10/24/2015 1200   BILIRUBINUR NEGATIVE 10/24/2015 1200   BILIRUBINUR NEG 01/18/2013 0859   KETONESUR NEGATIVE 10/24/2015 1200   PROTEINUR 30 (A) 10/24/2015 1200   UROBILINOGEN negative 01/18/2013 0859   UROBILINOGEN 1.0 02/12/2012 1520   NITRITE NEGATIVE 10/24/2015 1200   LEUKOCYTESUR SMALL (A) 10/24/2015 1200   Sepsis Labs: @LABRCNTIP (procalcitonin:4,lacticidven:4) ) Recent Results (from the past 240 hour(s))  Culture, blood (Routine X 2) w Reflex to ID Panel     Status: None (Preliminary result)   Collection Time: 10/24/15  5:22 PM  Result Value Ref Range Status   Specimen Description BLOOD RIGHT ARM  Final   Special Requests BOTTLES DRAWN AEROBIC AND ANAEROBIC 10CC  Final    Culture   Final    NO GROWTH 2 DAYS Performed at [facility]    Report Status PENDING  Incomplete  Culture, blood (Routine X 2) w Reflex to ID Panel     Status: None (Preliminary result)   Collection Time: 10/24/15  5:23 PM  Result Value Ref Range Status   Specimen Description BLOOD LEFT ARM  Final   Special Requests BOTTLES DRAWN AEROBIC AND ANAEROBIC 10CC  Final   Culture   Final    NO GROWTH 2 DAYS Performed at [facility]    Report Status PENDING  Incomplete  Culture, body fluid-bottle     Status: None (Preliminary result)   Collection Time: 10/25/15  1:01 PM  Result Value Ref Range Status   Specimen Description PLEURAL  Final   Special Requests NONE  Final   Culture   Final    NO GROWTH < 24 HOURS Performed at [facility]    Report Status PENDING  Incomplete  Gram stain     Status: None   Collection Time: 10/25/15  1:01 PM  Result Value Ref Range Status   Specimen Description PLEURAL  Final   Special Requests NONE  Final   Gram Stain   Final    WBC PRESENT,BOTH PMN AND MONONUCLEAR NO ORGANISMS SEEN CYTOSPIN Performed at [facility]    Report Status 10/25/2015 FINAL  Final  MRSA PCR Screening     Status: None   Collection Time: 10/26/15  8:17 AM  Result Value Ref Range Status   MRSA by PCR NEGATIVE NEGATIVE Final    Comment:        The GeneXpert MRSA Assay (FDA approved for NASAL specimens only), is one component of a comprehensive MRSA colonization surveillance program. It is not intended to diagnose MRSA infection nor to guide or monitor treatment for MRSA infections.          Radiology Studies: Dg Chest 1 View  Result  Date: 10/25/2015 CLINICAL DATA:  Status post right thoracentesis - upright standing inhalation EXAM: CHEST 1 VIEW COMPARISON:  10/24/2015 FINDINGS: Following thoracentesis, there is significantly less fluid on the right. No pneumothorax. Residual right lower lung zone opacity is likely combination of  residual fluid and atelectasis. Remainder of the lungs is clear. Cardiac silhouette is normal in size. No mediastinal or hilar masses or evidence of adenopathy. IMPRESSION: 1. Decreased fluid on the right following thoracentesis. No pneumothorax. Electronically Signed   By: Lajean Manes M.D.   On: 10/25/2015 13:33   US Thoracentesis Asp Pleural Space W/img Guide  Result Date: 10/25/2015 INDICATION: Metastatic renal cell cancer, recurrent right pleural effusion. Request made for diagnostic and therapeutic right thoracentesis. EXAM: ULTRASOUND GUIDED DIAGNOSTIC AND THERAPEUTIC RIGHT THORACENTESIS MEDICATIONS: None. COMPLICATIONS: None immediate. PROCEDURE: An ultrasound guided thoracentesis was thoroughly discussed with the patient and questions answered. The benefits, risks, alternatives and complications were also discussed. The patient understands and wishes to proceed with the procedure. Written consent was obtained. Ultrasound was performed to localize and mark an adequate pocket of fluid in the right chest. The area was then prepped and draped in the normal sterile fashion. 1% Lidocaine was used for local anesthesia. Under ultrasound guidance a Safe-T-Centesis catheter was introduced. Thoracentesis was performed. The catheter was removed and a dressing applied. FINDINGS: A total of approximately 980 cc of yellow fluid was removed. Samples were sent to the laboratory as requested by the clinical team. IMPRESSION: Successful ultrasound guided diagnostic and therapeutic right thoracentesis yielding 980 cc of pleural fluid. Read by: Rowe Robert, PA-C Electronically Signed   By: Corrie Mckusick D.O.   On: 10/25/2015 13:28      Scheduled Meds: . insulin aspart  0-15 Units Subcutaneous TID WC  . insulin aspart  0-5 Units Subcutaneous QHS  . megestrol  400 mg Oral BID  . piperacillin-tazobactam (ZOSYN)  IV  3.375 g Intravenous Q8H  . sodium chloride flush  3 mL Intravenous Q12H  . vancomycin  1,000 mg  Intravenous Q12H   Continuous Infusions:    LOS: 2 days    Time spent in minutes: 20    Zavion Sleight, MD [facility] Pager: [email or web address] Password [email or web address] 10/26/2015, 12:20 PM

## 2015-10-26 NOTE — Progress Notes (Signed)
  Echocardiogram 2D Echocardiogram has been performed.  [name] M 10/26/2015, 9:24 AM

## 2015-10-26 NOTE — Progress Notes (Signed)
IP PROGRESS NOTE  Subjective:    David [name] feels better this morning without any recent complaints. He reports no fevers or chills or sweats. His breathing is improved. He felt better after transfusion as well as well after his thoracentesis.  Objective:  Vital signs in last 24 hours: Temp:  [98 F (36.7 C)-98.4 F (36.9 C)] 98 F (36.7 C) (09/08 0609) Pulse Rate:  [72-109] 72 (09/08 0609) Resp:  [18-20] 20 (09/08 0609) BP: (121-149)/(62-94) 126/62 (09/08 0609) SpO2:  [100 %] 100 % (09/08 0609) Weight:  [207 lb 8 oz (94.1 kg)] 207 lb 8 oz (94.1 kg) (09/08 0609) Weight change:  Last BM Date: 10/24/15  Intake/Output from previous day: 09/07 0701 - 09/08 0700 In: 500 [IV Piggyback:500] Out: -  Alert, awake gentleman appeared without distress. Mouth: mucous membranes moist, pharynx normal without lesions Resp: clear to auscultation bilaterally no wheezes or dullness to percussion. Cardio: regular rate and rhythm, S1, S2 normal, no murmur, click, rub or gallop GI: soft, non-tender; bowel sounds normal; no masses,  no organomegaly Extremities: extremities normal, atraumatic, no cyanosis or edema    Lab Results:  Recent Labs  10/25/15 0517 10/26/15 0806  WBC 21.1* 23.2*  HGB 9.5* 9.8*  HCT 28.6* 29.0*  PLT 637* 661*    BMET  Recent Labs  10/25/15 0517 10/26/15 0806  NA 134* 134*  K 4.7 4.8  CL 105 101  CO2 20* 22  GLUCOSE 287* 251*  BUN 21* 18  CREATININE 1.31* 1.33*  CALCIUM 8.4* 8.9    Studies/Results: Dg Chest 1 View  Result Date: 10/25/2015 CLINICAL DATA:  Status post right thoracentesis - upright standing inhalation EXAM: CHEST 1 VIEW COMPARISON:  10/24/2015 FINDINGS: Following thoracentesis, there is significantly less fluid on the right. No pneumothorax. Residual right lower lung zone opacity is likely combination of residual fluid and atelectasis. Remainder of the lungs is clear. Cardiac silhouette is normal in size. No mediastinal or hilar masses or  evidence of adenopathy. IMPRESSION: 1. Decreased fluid on the right following thoracentesis. No pneumothorax. Electronically Signed   By: Lajean Manes M.D.   On: 10/25/2015 13:33   Dg Chest 2 View  Result Date: 10/24/2015 CLINICAL DATA:  Unexplained weight loss. EXAM: CHEST  2 VIEW COMPARISON:  CT 10/12/2015.  CT 09/30/2015. FINDINGS: Mediastinum hilar structures are normal. Cardiomegaly with pulmonary vascular prominence and bilateral interstitial prominence suggesting congestive heart failure. Prior right pleural effusion again noted. No pneumothorax. Thoracic spine scoliosis. IMPRESSION: 1. Findings suggest congestive heart failure bilateral from interstitial edema. 2. Persistent prominent right pleural effusion.  No pneumothorax. Electronically Signed   By: Marcello Moores  Register   On: 10/24/2015 11:11   US Thoracentesis Asp Pleural Space W/img Guide  Result Date: 10/25/2015 INDICATION: Metastatic renal cell cancer, recurrent right pleural effusion. Request made for diagnostic and therapeutic right thoracentesis. EXAM: ULTRASOUND GUIDED DIAGNOSTIC AND THERAPEUTIC RIGHT THORACENTESIS MEDICATIONS: None. COMPLICATIONS: None immediate. PROCEDURE: An ultrasound guided thoracentesis was thoroughly discussed with the patient and questions answered. The benefits, risks, alternatives and complications were also discussed. The patient understands and wishes to proceed with the procedure. Written consent was obtained. Ultrasound was performed to localize and mark an adequate pocket of fluid in the right chest. The area was then prepped and draped in the normal sterile fashion. 1% Lidocaine was used for local anesthesia. Under ultrasound guidance a Safe-T-Centesis catheter was introduced. Thoracentesis was performed. The catheter was removed and a dressing applied. FINDINGS: A total of approximately 980 cc of  yellow fluid was removed. Samples were sent to the laboratory as requested by the clinical team. IMPRESSION:  Successful ultrasound guided diagnostic and therapeutic right thoracentesis yielding 980 cc of pleural fluid. Read by: Rowe Robert, PA-C Electronically Signed   By: Corrie Mckusick D.O.   On: 10/25/2015 13:28    Medications: I have reviewed the patient's current medications.  Assessment/Plan:   55 year old gentleman with the following:   1. 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 He is status post biopsy of a pleural-based nodule which confirmed the presence of carcinoma although further immunohistochemical staining was not performed because of lack of tissue.   Systemic chemotherapy to be started upon recovery from this ongoing episode.   2. Leukocytosis:  likely reactive related to his malignancy. Infectious etiology could also be contributing and agree with broad-spectrum MRI next and awaiting cultures from his thoracentesis.  3. Fever: Appears to be tumor fever although he did respond to Zosyn. We'll await cultures for duration as well as topical antibiotics to be used.  4. Anemia:  Likely related to his malignancy. Respond to transfusion.  5. Disposition: anticipate discharge in the next 24-48 hours if he is medically stable. Follow-up is already established at the [facility].   LOS: 2 days   [facility] 10/26/2015, 9:57 AM

## 2015-10-27 LAB — GLUCOSE, CAPILLARY
Glucose-Capillary: 278 mg/dL — ABNORMAL HIGH (ref 65–99)
Glucose-Capillary: 294 mg/dL — ABNORMAL HIGH (ref 65–99)
Glucose-Capillary: 265 mg/dL — ABNORMAL HIGH (ref 65–99)
Glucose-Capillary: 298 mg/dL — ABNORMAL HIGH (ref 65–99)

## 2015-10-27 MED ORDER — SODIUM CHLORIDE 0.9 % IV BOLUS (SEPSIS)
500.0000 mL | Freq: Once | INTRAVENOUS | Status: AC
  Administered 2015-10-27: 500 mL via INTRAVENOUS

## 2015-10-27 MED ORDER — CHLORPROMAZINE HCL 25 MG/ML IJ SOLN
50.0000 mg | Freq: Three times a day (TID) | INTRAMUSCULAR | Status: DC | PRN
Start: 2015-10-27 — End: 2015-10-27

## 2015-10-27 MED ORDER — CHLORPROMAZINE HCL 50 MG PO TABS
50.0000 mg | ORAL_TABLET | Freq: Three times a day (TID) | ORAL | Status: DC | PRN
  Administered 2015-10-27 – 2015-10-28 (×3): 50 mg via ORAL
  Filled 2015-10-27 (×6): qty 1

## 2015-10-27 NOTE — Progress Notes (Signed)
PROGRESS NOTE    David [name]  [id number] DOB: Aug 16, 1960 DOA: 10/24/2015  PCP: Crisoforo Oxford, PA-C   Brief Narrative:  55 year old male with recently diagnosed metastatic renal cell cancer who was supposed to start chemotherapy on 9/6 and complained of profound weakness at the cancer center. He was found to have a hemoglobin of 7.1 and was therefore transferred to the ER. He did not admit to any blood in stool or history of gastric ulcers. Fecal occult blood negative in the ER. In addition the patient had been admitting to fevers over the past couple of weeks and had been started on Cipro as an outpatient. He continued to have fever and chills and in the ER was noted to have a temperature of 101.7.  Subjective: Continues to feel well. Has hiccoughs again.  No complaints of cough, fever, chills, nausea, vomiting, diarrhea, dysuria.   Assessment & Plan:   Principal Problem:  Symptomatic anemia- B12 and Iron deficiency  -Fecal occult blood negative-anemia likely secondary to acute illness/cancer - s/p 2 units of packed red blood cells bringing hemoglobin up from 7.1 to 9.5 - Anemia panel reveal B12 deficiency and low Iron levels- have started s/c B12 and oral Iron and given a dose of IV Iron as well.   Active Problems: Fever, unspecified- leukocytosis- tachycardia (SIRS)  And right pleural effusion - ? Secondary to underlying cancer -Has been on ciprofloxacin as outpatient for almost 2 weeks without improvement in fevers - WBC count elevated (has mild chronic elevation of about 14-15) - now in 20-21 range - underwent thoracentesis of right pleural effusion to ensure that he does not have an infection- gram stain is negative- fluid is exudative- await cultures -Thus far fevers have resolved on IV antibiotics- MRSA PCR is negative-  stopped Vancomycin on 9/8- Cont Zosyn today - low grade fever of 100.2 yesterday  Pulmonary edema/ chronic systolic CHF - on CXR- obtained ECHO  which reveals EF of 45-50% and mild diffuse hypokinesis- poor study due to tachycardia and tachypnea - recommended to repeat at a later date - based on this ECHO, he needs a B Blocker and ACE I but due to CKD and underlying renal cancer, I will hold off on an ACE I -  started low dose Coreg while watching BP    Hiccoughs - increase Thorazine    Type 2 diabetes mellitus with complication, without long-term current use of insulin -Continue insulin sliding scale-metformin on hold    Loss of weight - Due to poor appetite-continue Megace    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, pleural nodules and pleural effusion - Under care of oncology-biopsy obtained on 8/25 confirm presence of cancer -Palliative chemotherapy planned - Dr Alen Blew following  CKD 2-3 - stable   DVT prophylaxis: SCDs Code Status: Full code Family Communication: wife Disposition Plan: home in 2-3 days Consultants:   none Procedures:    Antimicrobials:  Anti-infectives    Start     Dose/Rate Route Frequency Ordered Stop   10/25/15 1400  piperacillin-tazobactam (ZOSYN) IVPB 3.375 g     3.375 g 12.5 mL/hr over 240 Minutes Intravenous Every 8 hours 10/25/15 0706     10/25/15 0800  vancomycin (VANCOCIN) IVPB 1000 mg/200 mL premix  Status:  Discontinued     1,000 mg 200 mL/hr over 60 Minutes Intravenous Every 12 hours 10/24/15 1733 10/26/15 1231   10/24/15 1830  vancomycin (VANCOCIN) 1,500 mg in sodium chloride 0.9 %  500 mL IVPB     1,500 mg 250 mL/hr over 120 Minutes Intravenous  Once 10/24/15 1733 10/24/15 2104   10/24/15 1800  ceFEPIme (MAXIPIME) 1 g in dextrose 5 % 50 mL IVPB  Status:  Discontinued     1 g 100 mL/hr over 30 Minutes Intravenous Every 8 hours 10/24/15 1733 10/25/15 0706   10/24/15 1500  ciprofloxacin (CIPRO) tablet 500 mg  Status:  Discontinued     500 mg Oral 2 times daily 10/24/15 1434 10/24/15 1733       Objective: Vitals:   10/26/15  2218 10/27/15 0633 10/27/15 0813 10/27/15 0814  BP: (!) 104/56  132/86   Pulse: 96 (!) 102  99  Resp: 20 20    Temp: 99.7 F (37.6 C) 98.2 F (36.8 C)    TempSrc: Oral Oral    SpO2: 97% 100%    Weight:      Height:        Intake/Output Summary (Last 24 hours) at 10/27/15 1249 Last data filed at 10/26/15 2300  Gross per 24 hour  Intake              440 ml  Output              375 ml  Net               65 ml   Filed Weights   10/26/15 0609  Weight: 94.1 kg (207 lb 8 oz)    Examination: General exam: Appears comfortable  HEENT: PERRLA, oral mucosa moist, no sclera icterus or thrush Respiratory system: Clear to auscultation. Respiratory effort normal. Cardiovascular system: S1 & S2 heard, RRR.  No murmurs  Gastrointestinal system: Abdomen soft, non-tender, nondistended. Normal bowel sound. No organomegaly Central nervous system: Alert and oriented. No focal neurological deficits. Extremities: No cyanosis, clubbing or edema Skin: No rashes or ulcers Psychiatry:  Mood & affect appropriate.     Data Reviewed: I have personally reviewed following labs and imaging studies  CBC:  Recent Labs Lab 10/24/15 0813 10/24/15 1043 10/25/15 0517 10/26/15 0806  WBC 21.0* 20.7* 21.1* 23.2*  NEUTROABS 16.3* 15.8*  --   --   HGB 7.7* 7.1* 9.5* 9.8*  HCT 23.8* 22.2* 28.6* 29.0*  MCV 77.3* 77.1* 79.7 78.4  PLT 650* 670* 637* [id number]*   Basic Metabolic Panel:  Recent Labs Lab 10/24/15 0813 10/24/15 1043 10/25/15 0517 10/26/15 0806  NA 135* 133* 134* 134*  K 5.1 4.9 4.7 4.8  CL  --  103 105 101  CO2 19* 22 20* 22  GLUCOSE 260* 284* 287* 251*  BUN 16.9 19 21* 18  CREATININE 1.3 1.27* 1.31* 1.33*  CALCIUM 9.5 8.4* 8.4* 8.9   GFR: Estimated Creatinine Clearance: 70.3 mL/min (by C-G formula based on SCr of 1.33 mg/dL). Liver Function Tests:  Recent Labs Lab 10/24/15 0813 10/24/15 1043 10/25/15 0517 10/26/15 0806  AST 17 18 14*  --   ALT 37 33 28  --   ALKPHOS 186*  157* 155*  --   BILITOT 0.89 0.4 0.9  --   PROT 7.6 7.0 6.7 7.2  ALBUMIN 1.9* 2.3* 2.2*  --    No results for input(s): LIPASE, AMYLASE in the last 168 hours. No results for input(s): AMMONIA in the last 168 hours. Coagulation Profile: No results for input(s): INR, PROTIME in the last 168 hours. Cardiac Enzymes: No results for input(s): CKTOTAL, CKMB, CKMBINDEX, TROPONINI in the last 168 hours. BNP (last 3 results) No  results for input(s): PROBNP in the last 8760 hours. HbA1C: No results for input(s): HGBA1C in the last 72 hours. CBG:  Recent Labs Lab 10/26/15 1157 10/26/15 1648 10/26/15 2214 10/27/15 0730 10/27/15 1158  GLUCAP 306* 319* 287* 278* 294*   Lipid Profile: No results for input(s): CHOL, HDL, LDLCALC, TRIG, CHOLHDL, LDLDIRECT in the last 72 hours. Thyroid Function Tests: No results for input(s): TSH, T4TOTAL, FREET4, T3FREE, THYROIDAB in the last 72 hours. Anemia Panel:  Recent Labs  10/26/15 0545  VITAMINB12 156*  FOLATE 7.4  FERRITIN 2,045*  TIBC 151*  IRON 10*  RETICCTPCT 1.6   Urine analysis:    Component Value Date/Time   COLORURINE YELLOW 10/24/2015 1200   APPEARANCEUR CLEAR 10/24/2015 1200   LABSPEC 1.020 10/24/2015 1200   PHURINE 6.5 10/24/2015 1200   GLUCOSEU 250 (A) 10/24/2015 1200   HGBUR MODERATE (A) 10/24/2015 1200   BILIRUBINUR NEGATIVE 10/24/2015 1200   BILIRUBINUR NEG 01/18/2013 0859   KETONESUR NEGATIVE 10/24/2015 1200   PROTEINUR 30 (A) 10/24/2015 1200   UROBILINOGEN negative 01/18/2013 0859   UROBILINOGEN 1.0 02/12/2012 1520   NITRITE NEGATIVE 10/24/2015 1200   LEUKOCYTESUR SMALL (A) 10/24/2015 1200   Sepsis Labs: @LABRCNTIP (procalcitonin:4,lacticidven:4) ) Recent Results (from the past 240 hour(s))  Culture, blood (Routine X 2) w Reflex to ID Panel     Status: None (Preliminary result)   Collection Time: 10/24/15  5:22 PM  Result Value Ref Range Status   Specimen Description BLOOD RIGHT ARM  Final   Special Requests  BOTTLES DRAWN AEROBIC AND ANAEROBIC 10CC  Final   Culture   Final    NO GROWTH 3 DAYS Performed at [facility]    Report Status PENDING  Incomplete  Culture, blood (Routine X 2) w Reflex to ID Panel     Status: None (Preliminary result)   Collection Time: 10/24/15  5:23 PM  Result Value Ref Range Status   Specimen Description BLOOD LEFT ARM  Final   Special Requests BOTTLES DRAWN AEROBIC AND ANAEROBIC 10CC  Final   Culture   Final    NO GROWTH 3 DAYS Performed at [facility]    Report Status PENDING  Incomplete  Culture, body fluid-bottle     Status: None (Preliminary result)   Collection Time: 10/25/15  1:01 PM  Result Value Ref Range Status   Specimen Description PLEURAL  Final   Special Requests NONE  Final   Culture   Final    NO GROWTH 2 DAYS Performed at [facility]    Report Status PENDING  Incomplete  Gram stain     Status: None   Collection Time: 10/25/15  1:01 PM  Result Value Ref Range Status   Specimen Description PLEURAL  Final   Special Requests NONE  Final   Gram Stain   Final    WBC PRESENT,BOTH PMN AND MONONUCLEAR NO ORGANISMS SEEN CYTOSPIN Performed at [facility]    Report Status 10/25/2015 FINAL  Final  MRSA PCR Screening     Status: None   Collection Time: 10/26/15  8:17 AM  Result Value Ref Range Status   MRSA by PCR NEGATIVE NEGATIVE Final    Comment:        The GeneXpert MRSA Assay (FDA approved for NASAL specimens only), is one component of a comprehensive MRSA colonization surveillance program. It is not intended to diagnose MRSA infection nor to guide or monitor treatment for MRSA infections.  Radiology Studies: Dg Chest 1 View  Result Date: 10/25/2015 CLINICAL DATA:  Status post right thoracentesis - upright standing inhalation EXAM: CHEST 1 VIEW COMPARISON:  10/24/2015 FINDINGS: Following thoracentesis, there is significantly less fluid on the right. No pneumothorax. Residual right lower  lung zone opacity is likely combination of residual fluid and atelectasis. Remainder of the lungs is clear. Cardiac silhouette is normal in size. No mediastinal or hilar masses or evidence of adenopathy. IMPRESSION: 1. Decreased fluid on the right following thoracentesis. No pneumothorax. Electronically Signed   By: Lajean Manes M.D.   On: 10/25/2015 13:33   US Thoracentesis Asp Pleural Space W/img Guide  Result Date: 10/25/2015 INDICATION: Metastatic renal cell cancer, recurrent right pleural effusion. Request made for diagnostic and therapeutic right thoracentesis. EXAM: ULTRASOUND GUIDED DIAGNOSTIC AND THERAPEUTIC RIGHT THORACENTESIS MEDICATIONS: None. COMPLICATIONS: None immediate. PROCEDURE: An ultrasound guided thoracentesis was thoroughly discussed with the patient and questions answered. The benefits, risks, alternatives and complications were also discussed. The patient understands and wishes to proceed with the procedure. Written consent was obtained. Ultrasound was performed to localize and mark an adequate pocket of fluid in the right chest. The area was then prepped and draped in the normal sterile fashion. 1% Lidocaine was used for local anesthesia. Under ultrasound guidance a Safe-T-Centesis catheter was introduced. Thoracentesis was performed. The catheter was removed and a dressing applied. FINDINGS: A total of approximately 980 cc of yellow fluid was removed. Samples were sent to the laboratory as requested by the clinical team. IMPRESSION: Successful ultrasound guided diagnostic and therapeutic right thoracentesis yielding 980 cc of pleural fluid. Read by: Rowe Robert, PA-C Electronically Signed   By: Corrie Mckusick D.O.   On: 10/25/2015 13:28      Scheduled Meds: . carvedilol  3.125 mg Oral BID WC  . cyanocobalamin  1,000 mcg Subcutaneous Daily  . ferrous sulfate  325 mg Oral BID WC  . insulin aspart  0-15 Units Subcutaneous TID WC  . insulin aspart  0-5 Units Subcutaneous QHS  .  megestrol  400 mg Oral BID  . piperacillin-tazobactam (ZOSYN)  IV  3.375 g Intravenous Q8H  . sodium chloride flush  3 mL Intravenous Q12H   Continuous Infusions:    LOS: 3 days    Time spent in minutes: 73    Eldred Sooy, MD [facility] Pager: [email or web address] Password [email or web address] 10/27/2015, 12:49 PM

## 2015-10-27 NOTE — Progress Notes (Signed)
Pharmacy Antibiotic Note  David [name] is a 55 y.o. male with PMH metastatic renal carcinoma sent on 10/24/2015 from [facility] for weakness and anemia. Reports low-grade fevers since having recent CT-guided Bx of L pleura and thoracentesis, for which patient was originally placed on ciprofloxacin. Pharmacy has been consulted for vancomycin and cefepime dosing to r/o possible infection of pleural space. Antibiotics change to zosyn monotherapy.  Today, 10/27/2015: Day #4 antibiotics   Renal: SCr elevated but stable   WBC elevated  Tm last 24h= 100.2  Cultures unrevealing  Plan:  Continue zosyn 3.375gm IV q8h over 4h infusion  Height: 5\' 9"  (175.3 cm) Weight: 207 lb 8 oz (94.1 kg) IBW/kg (Calculated) : 70.7  Temp (24hrs), Avg:99.4 F (37.4 C), Min:98.2 F (36.8 C), Max:100.2 F (37.9 C)   Recent Labs Lab 10/24/15 0813 10/24/15 0813 10/24/15 1043 10/24/15 1734 10/25/15 0517 10/26/15 0806  WBC 21.0*  --  20.7*  --  21.1* 23.2*  CREATININE  --  1.3 1.27*  --  1.31* 1.33*  LATICACIDVEN  --   --   --  1.2  --   --     Estimated Creatinine Clearance: 70.3 mL/min (by C-G formula based on SCr of 1.33 mg/dL).    No Known Allergies  Antimicrobials this admission: vancomycin 9/6 >> 9/8 cefepime 9/6 >> 9/7 Zosyn 9/7 >>   Dose adjustments this admission: ---   Microbiology results: 9/6 BCx: NGTD 9/7: pleural fluid: NG (LDH pleural fluid:serum > 0.6 so likely exudate) 9/8: MRSA PCR: negative  Thank you for allowing pharmacy to be a part of this patient's care.  Doreene Eland, PharmD, BCPS.   Pager: [phone] 10/27/2015 11:02 AM

## 2015-10-28 LAB — GLUCOSE, CAPILLARY
GLUCOSE-CAPILLARY: 282 mg/dL — AB (ref 65–99)
GLUCOSE-CAPILLARY: 288 mg/dL — AB (ref 65–99)

## 2015-10-28 MED ORDER — METFORMIN HCL 500 MG PO TABS: 500.0000 mg | ORAL_TABLET | Freq: Every day | ORAL | 11 refills | Status: DC

## 2015-10-28 MED ORDER — FERROUS SULFATE 325 (65 FE) MG PO TABS: 325.0000 mg | ORAL_TABLET | Freq: Two times a day (BID) | ORAL | 3 refills | Status: DC

## 2015-10-28 MED ORDER — CARVEDILOL 3.125 MG PO TABS: 3.1250 mg | ORAL_TABLET | Freq: Two times a day (BID) | ORAL | 0 refills | Status: DC

## 2015-10-28 MED ORDER — CHLORPROMAZINE HCL 50 MG PO TABS
50.0000 mg | ORAL_TABLET | Freq: Three times a day (TID) | ORAL | 0 refills | Status: DC | PRN
Start: 2015-10-28 — End: 2016-06-24

## 2015-10-28 MED ORDER — AMOXICILLIN-POT CLAVULANATE 875-125 MG PO TABS: 1.0000 | ORAL_TABLET | Freq: Two times a day (BID) | ORAL | 0 refills | Status: DC

## 2015-10-28 MED ORDER — VITAMIN B-12 1000 MCG PO TABS: 1000.0000 ug | ORAL_TABLET | Freq: Every day | ORAL | 0 refills | Status: DC

## 2015-10-28 NOTE — Discharge Summary (Signed)
Physician Discharge Summary  Bralin Shehee [id number] DOB: [birth year] DOA: 10/24/2015  PCP: Crisoforo Oxford, PA-C  Admit date: 10/24/2015 Discharge date: 10/29/2015  Admitted From: home  Disposition:  home   Recommendations for Outpatient Follow-up:  1. Follow for fevers   Discharge Condition:  stable   CODE STATUS:  Full code   Diet recommendation:  diabetic Consultations:  IR  Oncology     Discharge Diagnoses:  Principal Problem:   Symptomatic anemia Active Problems:   Fever, unspecified   Pleural effusion on right   SIRS (systemic inflammatory response syndrome) (HCC)   S/P thoracentesis   Type 2 diabetes mellitus with complication, without long-term current use of insulin (HCC)   Loss of weight   Leukocytosis   Primary cancer of renal pelvis (HCC)   Pleural mass    Subjective: No complaint of fever, chills, dyspnea or cough.   Brief Summary: 55 year old male with recently diagnosed metastatic renal cell cancer who was supposed to start chemotherapy on 9/6 and complained of profound weakness at the cancer center. He was found to have a hemoglobin of 7.1 and was therefore transferred to the ER. He did not admit to any blood in stool or history of gastric ulcers. Fecal occult blood negative in the ER. In addition the patient had been admitting to fevers over the past couple of weeks and had been started on Cipro as an outpatient. He continued to have fever and chills and in the ER was noted to have a temperature of 101.7.   Hospital Course:  Principal Problem:  Symptomatic anemia- B12 and Iron deficiency  -Fecal occult blood negative-anemia likely secondary to acute illness/cancer - s/p 2 units of packed red blood cells bringing hemoglobin up from 7.1 to 9.5 - Anemia panel reveal B12 deficiency and low Iron levels- have started B12 replacement,  oral Iron and given a dose of IV Iron as well.   Active Problems: Fever, unspecified- leukocytosis- tachycardia  (SIRS) and right pleural effusion - effusion likely malignant secondary to underlying cancer -Has been on ciprofloxacin as outpatient for almost 2 weeks without improvement in fevers - WBC count elevated (has mild chronic elevation of about 14-15) - now in 20-21 range -9/7 underwent ultrasound guided thoracentesis of right pleural effusion to ensure that he does not have an infection- gram stain is negative- fluid is exudative-  cultures negative as well -Thus far fevers have resolved on IV antibiotics- MRSA PCR is negative-  stopped Vancomycin on 9/8 - as fevers resolved with antibiotics, although no source was found, I have decided to transition Zosyn to Augmentin for 1 more week  Pulmonary edema/ chronic systolic CHF - on CXR- obtained ECHO which reveals EF of 45-50% and mild diffuse hypokinesis- poor study due to tachycardia and tachypnea - recommended to repeat at a later date - based on this ECHO, he needs a B Blocker and ACE I but due to CKD and underlying renal cancer, I will hold off on an ACE I -  started low dose Coreg    Hiccoughs - increased Thorazine with some improvement noted    Type 2 diabetes mellitus with complication, without long-term current use of insulin -Continue insulin sliding scale-metformin on hold    Loss of weight - Due to poor appetite-continue Megace    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, pleural nodules and pleural effusion - Under care of oncology-biopsy obtained on 8/25 confirm presence of cancer -Palliative chemotherapy planned -  Dr Alen Blew following  CKD 2-3 - stable   Discharge Instructions  Discharge Instructions    Diet - low sodium heart healthy    Complete by:  As directed   Increase activity slowly    Complete by:  As directed       Medication List    STOP taking these medications   ciprofloxacin 500 MG tablet Commonly known as:  CIPRO     TAKE these medications    amoxicillin-clavulanate 875-125 MG tablet Commonly known as:  AUGMENTIN Take 1 tablet by mouth 2 (two) times daily.   carvedilol 3.125 MG tablet Commonly known as:  COREG Take 1 tablet (3.125 mg total) by mouth 2 (two) times daily with a meal.   chlorproMAZINE 50 MG tablet Commonly known as:  THORAZINE Take 1 tablet (50 mg total) by mouth 3 (three) times daily as needed for hiccoughs. What changed:  medication strength  how much to take   clonazePAM 0.5 MG tablet Commonly known as:  KLONOPIN 1 tablet po 1-2 times daily prn anxiety, caution due to sedation   ferrous sulfate 325 (65 FE) MG tablet Take 1 tablet (325 mg total) by mouth 2 (two) times daily with a meal.   HYDROcodone-acetaminophen 5-325 MG tablet Commonly known as:  NORCO/VICODIN Take 1 tablet by mouth every 6 (six) hours as needed for moderate pain.   megestrol 400 MG/10ML suspension Commonly known as:  MEGACE Take 10 mLs (400 mg total) by mouth 2 (two) times daily.   metFORMIN 500 MG tablet Commonly known as:  GLUCOPHAGE Take 1 tablet (500 mg total) by mouth daily with breakfast.   vitamin B-12 1000 MCG tablet Commonly known as:  CYANOCOBALAMIN Take 1 tablet (1,000 mcg total) by mouth daily.       No Known Allergies   Procedures/Studies: Thoracentesis   Dg Chest 1 View  Result Date: 10/25/2015 CLINICAL DATA:  Status post right thoracentesis - upright standing inhalation EXAM: CHEST 1 VIEW COMPARISON:  10/24/2015 FINDINGS: Following thoracentesis, there is significantly less fluid on the right. No pneumothorax. Residual right lower lung zone opacity is likely combination of residual fluid and atelectasis. Remainder of the lungs is clear. Cardiac silhouette is normal in size. No mediastinal or hilar masses or evidence of adenopathy. IMPRESSION: 1. Decreased fluid on the right following thoracentesis. No pneumothorax. Electronically Signed   By: Lajean Manes M.D.   On: 10/25/2015 13:33   Dg Chest 1  View  Result Date: 10/01/2015 CLINICAL DATA:  Right pleural effusion.  Status post thoracentesis. EXAM: CHEST 1 VIEW COMPARISON:  Chest CT on 09/30/2015 FINDINGS: Tiny right pleural effusion appears decreased in size compared to previous CT. No pneumothorax visualized. Multiple pleural-based nodular opacities seen at the right lung base, as better appreciated on recent CT. No evidence of pulmonary consolidation or edema. Heart size and mediastinal contours are within normal limits. IMPRESSION: Tiny right pleural effusion has decreased compared with previous CT. No pneumothorax visualized. Pleural-based nodular opacities in right lung base noted, as better demonstrated on recent chest CT. Electronically Signed   By: Earle Gell M.D.   On: 10/01/2015 15:15   Dg Chest 2 View  Result Date: 10/24/2015 CLINICAL DATA:  Unexplained weight loss. EXAM: CHEST  2 VIEW COMPARISON:  CT 10/12/2015.  CT 09/30/2015. FINDINGS: Mediastinum hilar structures are normal. Cardiomegaly with pulmonary vascular prominence and bilateral interstitial prominence suggesting congestive heart failure. Prior right pleural effusion again noted. No pneumothorax. Thoracic spine scoliosis. IMPRESSION: 1. Findings suggest congestive heart  failure bilateral from interstitial edema. 2. Persistent prominent right pleural effusion.  No pneumothorax. Electronically Signed   By: Marcello Moores  Register   On: 10/24/2015 11:11   Ct Chest W Contrast  Result Date: 09/30/2015 CLINICAL DATA:  Pulmonary and pleural nodules seen on recent abdomen CT. Left renal mass. EXAM: CT CHEST WITH CONTRAST TECHNIQUE: Multidetector CT imaging of the chest was performed during intravenous contrast administration. CONTRAST:  46mL ISOVUE-300 IOPAMIDOL (ISOVUE-300) INJECTION 61% COMPARISON:  AP CT on 09/30/2015 FINDINGS: Cardiovascular: No significant abnormality. Mediastinum/Nodes: Left suprahilar mass or lymphadenopathy seen measuring 2.5 x 2.8 cm on image 52/2. No  pathologically enlarged mediastinal or axillary lymph nodes identified. Lungs/Pleura: Moderate right pleural effusion with multiple enhancing soft tissue nodules in the right pleural space, consistent with pleural metastatic disease and malignant pleural effusion. Multiple sub-cm pulmonary nodules are also seen within both lungs, highly suspicious for pulmonary metastases. No dominant pulmonary mass identified. No evidence of left-sided pleural effusion. Upper Abdomen: Unremarkable. Musculoskeletal: No suspicious bone lesions identified within the thorax. IMPRESSION: Moderate right pleural effusion with multiple right pleural-based masses, consistent with pleural metastatic disease and malignant pleural effusion. Consider diagnostic thoracentesis for cytology. Multiple bilateral sub-cm pulmonary nodules, highly suspicious for pulmonary metastases. 2.8 cm left suprahilar mass or lymphadenopathy. This favors metastatic lymphadenopathy, with primary lung carcinoma considered less likely. Electronically Signed   By: Earle Gell M.D.   On: 09/30/2015 15:53   Mr Jeri Cos [id number] Contrast  Result Date: 10/17/2015 CLINICAL DATA:  Evaluation for brain metastases. EXAM: MRI HEAD WITHOUT AND WITH CONTRAST TECHNIQUE: Multiplanar, multiecho pulse sequences of the brain and surrounding structures were obtained without and with intravenous contrast. CONTRAST:  20 mL MultiHance COMPARISON:  None. FINDINGS: Brain Parenchyma: No acute infarct or intraparenchymal hemorrhage. No abnormal parenchymal contrast enhancement. No focal parenchymal signal abnormality. No mass lesion or midline shift. The major intracranial flow voids are preserved. The midline structures are normal. Ventricles, Sulci and Extra-axial Spaces: Normal for age. No extra-axial collection. Paranasal Sinuses and Mastoids: Inflammatory changes of the left sphenoid sinus. Orbits: Normal. Bones and Soft Tissues: The visualized skull base, calvarium and extracranial soft  tissues are normal. IMPRESSION: No intracranial metastatic disease. Electronically Signed   By: Ulyses Jarred M.D.   On: 10/17/2015 03:01   Ct Abdomen Pelvis W Contrast  Result Date: 09/30/2015 CLINICAL DATA:  Right upper quadrant pain and tenderness for 1 week. Anorexia. Constipation. EXAM: CT ABDOMEN AND PELVIS WITH CONTRAST TECHNIQUE: Multidetector CT imaging of the abdomen and pelvis was performed using the standard protocol following bolus administration of intravenous contrast. CONTRAST:  184mL ISOVUE-300 IOPAMIDOL (ISOVUE-300) INJECTION 61% COMPARISON:  None. FINDINGS: Lower chest: A moderate right pleural effusion is seen. There are multiple enhancing soft tissue nodules within the right pleural space, suspicious for malignant pleural effusion. Small bibasilar pulmonary nodule of spleen, largest measuring 10 mm in posterior left lower lobe on image 18/7. These are suspicious for pulmonary metastases Hepatobiliary: No masses or other significant abnormality. Gallbladder is unremarkable. Pancreas: No mass, inflammatory changes, or other significant abnormality. Spleen: Within normal limits in size and appearance. Adrenals/Urinary Tract: Normal adrenal glands. Right kidney is normal in appearance. The left kidney shows an ill-defined heterogeneous masslike opacity seen centrally within the area of the left renal pelvis. This measures 3.3 x 5.1 cm on image 30/2. There is peripheral left renal caliectasis. No evidence of ureteral calculi or dilatation. Unopacified urinary bladder is unremarkable in appearance. Stomach/Bowel: No evidence of obstruction, inflammatory process, or abnormal  fluid collections. Normal appendix visualized. Vascular/Lymphatic: No pathologically enlarged lymph nodes. No evidence of abdominal aortic aneurysm. Aortic atherosclerosis noted. Reproductive: No mass or other significant abnormality. Other: None. Musculoskeletal: Several small sclerotic bone lesions are seen involving the  right sacrum, bilateral iliac bones and L1 vertebral body, suspicious for sclerotic bone metastases. IMPRESSION: Moderate right pleural effusion with multiple enhancing right pleural soft tissue nodules, consistent with malignant pleural effusion. Bibasilar pulmonary nodules measuring up to 10 mm, suspicious for pulmonary metastases. Chest CT with contrast recommended for further evaluation. 5 cm ill-defined mass in the central left kidney in area of renal pelvis, causing peripheral caliectasis consistent with obstruction. Differential diagnosis includes renal cell carcinoma, urothelial carcinoma, and renal metastasis. Sclerotic lesions in pelvis and lumbar spine, highly suspicious for bone metastases. Aortic atherosclerosis noted. Electronically Signed   By: Earle Gell M.D.   On: 09/30/2015 14:16   Ct Biopsy  Result Date: 10/12/2015 CLINICAL DATA:  Right pleural nodules, pulmonary nodules, left renal mass, sclerotic bone lesions suggesting metastatic disease. Negative thoracentesis cytology. EXAM: CT GUIDED CORE BIOPSY OF RIGHT PLEURAL NODULE ANESTHESIA/SEDATION: Intravenous Fentanyl and Versed were administered as conscious sedation during continuous monitoring of the patient's level of consciousness and physiological / cardiorespiratory status by the radiology RN, with a total moderate sedation time of 11 minutes. PROCEDURE: The procedure risks, benefits, and alternatives were explained to the patient. Questions regarding the procedure were encouraged and answered. The patient understands and consents to the procedure. Patient placed prone. Select axial scans through the thorax obtained. Right posterior pleural nodules near the lung base were again localized and an appropriate skin entry site was determined and marked. The operative field was prepped with chlorhexidinein a sterile fashion, and a sterile drape was applied covering the operative field. A sterile gown and sterile gloves were used for the  procedure. Local anesthesia was provided with 1% Lidocaine. Under CT fluoroscopic guidance, a 17 gauge trocar needle was advanced to the margin of the lesion. Once needle tip position was confirmed, coaxial 18-gauge core biopsy samples were obtained, submitted in saline to surgical pathology. The guide needle was removed. Postprocedure scan shows no hemorrhage or pneumothorax. The patient tolerated the procedure well. COMPLICATIONS: None immediate FINDINGS: Confluent nodular densities posteriorly in the lower right pleural space were again localized. Solid-appearing core biopsy specimens were obtained, submitted to surgical pathology. IMPRESSION: 1. Technically successful CT-guided core biopsy of right pleural mass. Electronically Signed   By: Lucrezia Europe M.D.   On: 10/12/2015 13:03   US Thoracentesis Asp Pleural Space W/img Guide  Result Date: 10/25/2015 INDICATION: Metastatic renal cell cancer, recurrent right pleural effusion. Request made for diagnostic and therapeutic right thoracentesis. EXAM: ULTRASOUND GUIDED DIAGNOSTIC AND THERAPEUTIC RIGHT THORACENTESIS MEDICATIONS: None. COMPLICATIONS: None immediate. PROCEDURE: An ultrasound guided thoracentesis was thoroughly discussed with the patient and questions answered. The benefits, risks, alternatives and complications were also discussed. The patient understands and wishes to proceed with the procedure. Written consent was obtained. Ultrasound was performed to localize and mark an adequate pocket of fluid in the right chest. The area was then prepped and draped in the normal sterile fashion. 1% Lidocaine was used for local anesthesia. Under ultrasound guidance a Safe-T-Centesis catheter was introduced. Thoracentesis was performed. The catheter was removed and a dressing applied. FINDINGS: A total of approximately 980 cc of yellow fluid was removed. Samples were sent to the laboratory as requested by the clinical team. IMPRESSION: Successful ultrasound guided  diagnostic and therapeutic right thoracentesis yielding 980  cc of pleural fluid. Read by: Rowe Robert, PA-C Electronically Signed   By: Corrie Mckusick D.O.   On: 10/25/2015 13:28   US Thoracentesis Asp Pleural Space W/img Guide  Result Date: 10/01/2015 INDICATION: Pleural effusion and pleural nodules seen on recent CT scan. Request for diagnostic and therapeutic thoracentesis. EXAM: ULTRASOUND GUIDED RIGHT THORACENTESIS MEDICATIONS: 1% Lidocaine. COMPLICATIONS: None immediate. PROCEDURE: An ultrasound guided thoracentesis was thoroughly discussed with the patient and questions answered. The benefits, risks, alternatives and complications were also discussed. The patient understands and wishes to proceed with the procedure. Written consent was obtained. Ultrasound was performed to localize and mark an adequate pocket of fluid in the right chest. The area was then prepped and draped in the normal sterile fashion. 1% Lidocaine was used for local anesthesia. Under ultrasound guidance a 6 Fr Safe-T-Centesis catheter was introduced. Thoracentesis was performed. The catheter was removed and a dressing applied. FINDINGS: A total of approximately 475 ml of clear yellow fluid was removed. Samples were sent to the laboratory as requested by the clinical team. IMPRESSION: Successful ultrasound guided right thoracentesis yielding 475 ml of pleural fluid. Read by:  Gareth Eagle, PA-C Electronically Signed   By: Jerilynn Mages.  Shick M.D.   On: 10/01/2015 15:36       Discharge Exam: Vitals:   10/28/15 0611 10/28/15 0848  BP: 109/69 122/75  Pulse: 90 98  Resp: 18   Temp: 98.8 F (37.1 C)    Vitals:   10/27/15 1739 10/27/15 2205 10/28/15 0611 10/28/15 0848  BP:  114/76 109/69 122/75  Pulse:  93 90 98  Resp:  18 18   Temp: 98.4 F (36.9 C) 99 F (37.2 C) 98.8 F (37.1 C)   TempSrc: Oral Oral Oral   SpO2:  100% 99%   Weight:      Height:        General: Pt is alert, awake, not in acute distress Cardiovascular:  RRR, S1/S2 +, no rubs, no gallops Respiratory: CTA bilaterally, no wheezing, no rhonchi Abdominal: Soft, NT, ND, bowel sounds + Extremities: no edema, no cyanosis    The results of significant diagnostics from this hospitalization (including imaging, microbiology, ancillary and laboratory) are listed below for reference.     Microbiology: Recent Results (from the past 240 hour(s))  Culture, blood (Routine X 2) w Reflex to ID Panel     Status: None (Preliminary result)   Collection Time: 10/24/15  5:22 PM  Result Value Ref Range Status   Specimen Description BLOOD RIGHT ARM  Final   Special Requests BOTTLES DRAWN AEROBIC AND ANAEROBIC 10CC  Final   Culture   Final    NO GROWTH 4 DAYS Performed at [facility]    Report Status PENDING  Incomplete  Culture, blood (Routine X 2) w Reflex to ID Panel     Status: None (Preliminary result)   Collection Time: 10/24/15  5:23 PM  Result Value Ref Range Status   Specimen Description BLOOD LEFT ARM  Final   Special Requests BOTTLES DRAWN AEROBIC AND ANAEROBIC 10CC  Final   Culture   Final    NO GROWTH 4 DAYS Performed at [facility]    Report Status PENDING  Incomplete  Culture, body fluid-bottle     Status: None (Preliminary result)   Collection Time: 10/25/15  1:01 PM  Result Value Ref Range Status   Specimen Description PLEURAL  Final   Special Requests NONE  Final   Culture   Final  NO GROWTH 3 DAYS Performed at [facility]    Report Status PENDING  Incomplete  Gram stain     Status: None   Collection Time: 10/25/15  1:01 PM  Result Value Ref Range Status   Specimen Description PLEURAL  Final   Special Requests NONE  Final   Gram Stain   Final    WBC PRESENT,BOTH PMN AND MONONUCLEAR NO ORGANISMS SEEN CYTOSPIN Performed at [facility]    Report Status 10/25/2015 FINAL  Final  MRSA PCR Screening     Status: None   Collection Time: 10/26/15  8:17 AM  Result Value Ref Range Status   MRSA by  PCR NEGATIVE NEGATIVE Final    Comment:        The GeneXpert MRSA Assay (FDA approved for NASAL specimens only), is one component of a comprehensive MRSA colonization surveillance program. It is not intended to diagnose MRSA infection nor to guide or monitor treatment for MRSA infections.      Labs: BNP (last 3 results)  Recent Labs  10/24/15 1043  BNP [id number]   Basic Metabolic Panel:  Recent Labs Lab 10/24/15 0813 10/24/15 1043 10/25/15 0517 10/26/15 0806  NA 135* 133* 134* 134*  K 5.1 4.9 4.7 4.8  CL  --  103 105 101  CO2 19* 22 20* 22  GLUCOSE 260* 284* 287* 251*  BUN 16.9 19 21* 18  CREATININE 1.3 1.27* 1.31* 1.33*  CALCIUM 9.5 8.4* 8.4* 8.9   Liver Function Tests:  Recent Labs Lab 10/24/15 0813 10/24/15 1043 10/25/15 0517 10/26/15 0806  AST 17 18 14*  --   ALT 37 33 28  --   ALKPHOS 186* 157* 155*  --   BILITOT 0.89 0.4 0.9  --   PROT 7.6 7.0 6.7 7.2  ALBUMIN 1.9* 2.3* 2.2*  --    No results for input(s): LIPASE, AMYLASE in the last 168 hours. No results for input(s): AMMONIA in the last 168 hours. CBC:  Recent Labs Lab 10/24/15 0813 10/24/15 1043 10/25/15 0517 10/26/15 0806  WBC 21.0* 20.7* 21.1* 23.2*  NEUTROABS 16.3* 15.8*  --   --   HGB 7.7* 7.1* 9.5* 9.8*  HCT 23.8* 22.2* 28.6* 29.0*  MCV 77.3* 77.1* 79.7 78.4  PLT 650* 670* 637* 661*   Cardiac Enzymes: No results for input(s): CKTOTAL, CKMB, CKMBINDEX, TROPONINI in the last 168 hours. BNP: Invalid input(s): POCBNP CBG:  Recent Labs Lab 10/27/15 1158 10/27/15 1641 10/27/15 2201 10/28/15 0746 10/28/15 1228  GLUCAP 294* 265* 298* 282* 288*   D-Dimer No results for input(s): DDIMER in the last 72 hours. Hgb A1c No results for input(s): HGBA1C in the last 72 hours. Lipid Profile No results for input(s): CHOL, HDL, LDLCALC, TRIG, CHOLHDL, LDLDIRECT in the last 72 hours. Thyroid function studies No results for input(s): TSH, T4TOTAL, T3FREE, THYROIDAB in the last 72  hours.  Invalid input(s): FREET3 Anemia work up No results for input(s): VITAMINB12, FOLATE, FERRITIN, TIBC, IRON, RETICCTPCT in the last 72 hours. Urinalysis    Component Value Date/Time   COLORURINE YELLOW 10/24/2015 1200   APPEARANCEUR CLEAR 10/24/2015 1200   LABSPEC 1.020 10/24/2015 1200   PHURINE 6.5 10/24/2015 1200   GLUCOSEU 250 (A) 10/24/2015 1200   HGBUR MODERATE (A) 10/24/2015 1200   BILIRUBINUR NEGATIVE 10/24/2015 1200   BILIRUBINUR NEG 01/18/2013 0859   KETONESUR NEGATIVE 10/24/2015 1200   PROTEINUR 30 (A) 10/24/2015 1200   UROBILINOGEN negative 01/18/2013 0859   UROBILINOGEN 1.0 02/12/2012 1520  NITRITE NEGATIVE 10/24/2015 1200   LEUKOCYTESUR SMALL (A) 10/24/2015 1200   Sepsis Labs Invalid input(s): PROCALCITONIN,  WBC,  LACTICIDVEN Microbiology Recent Results (from the past 240 hour(s))  Culture, blood (Routine X 2) w Reflex to ID Panel     Status: None (Preliminary result)   Collection Time: 10/24/15  5:22 PM  Result Value Ref Range Status   Specimen Description BLOOD RIGHT ARM  Final   Special Requests BOTTLES DRAWN AEROBIC AND ANAEROBIC 10CC  Final   Culture   Final    NO GROWTH 4 DAYS Performed at [facility]    Report Status PENDING  Incomplete  Culture, blood (Routine X 2) w Reflex to ID Panel     Status: None (Preliminary result)   Collection Time: 10/24/15  5:23 PM  Result Value Ref Range Status   Specimen Description BLOOD LEFT ARM  Final   Special Requests BOTTLES DRAWN AEROBIC AND ANAEROBIC 10CC  Final   Culture   Final    NO GROWTH 4 DAYS Performed at [facility]    Report Status PENDING  Incomplete  Culture, body fluid-bottle     Status: None (Preliminary result)   Collection Time: 10/25/15  1:01 PM  Result Value Ref Range Status   Specimen Description PLEURAL  Final   Special Requests NONE  Final   Culture   Final    NO GROWTH 3 DAYS Performed at [facility]    Report Status PENDING  Incomplete  Gram stain      Status: None   Collection Time: 10/25/15  1:01 PM  Result Value Ref Range Status   Specimen Description PLEURAL  Final   Special Requests NONE  Final   Gram Stain   Final    WBC PRESENT,BOTH PMN AND MONONUCLEAR NO ORGANISMS SEEN CYTOSPIN Performed at [facility]    Report Status 10/25/2015 FINAL  Final  MRSA PCR Screening     Status: None   Collection Time: 10/26/15  8:17 AM  Result Value Ref Range Status   MRSA by PCR NEGATIVE NEGATIVE Final    Comment:        The GeneXpert MRSA Assay (FDA approved for NASAL specimens only), is one component of a comprehensive MRSA colonization surveillance program. It is not intended to diagnose MRSA infection nor to guide or monitor treatment for MRSA infections.      Time coordinating discharge: Over 30 minutes  SIGNED:   Debbe Odea, MD  [facility] 10/29/2015, 1:48 PM Pager   If 7PM-7AM, please contact night-coverage [email or web address] Password TRH1

## 2015-10-28 NOTE — Progress Notes (Signed)
Patient discharged home with wife, discharge instructions given and explained to patient/wife and they verbalized understanding, denies any pain/distress,no wound noted, skin intact. Accompanied home by wife, transported to the car by staff via wheelchair.

## 2015-10-28 NOTE — Care Management Note (Signed)
Case Management Note  Patient Details  Name: David [name] Driskel MRN: [id number] Date of Birth: [birth year]  Subjective/Objective:                  Weakness and shortness of breath  Action/Plan: CM spoke with patient and his wife at the bedside. [place] letter provided for discharge medications. Patient has been buying a few pills of his medications depending on how many he can afford. He has applied for Medicaid. His wife is a school bus driver.   Expected Discharge Date:  10/28/15               Expected Discharge Plan:  Home/Self Care  In-House Referral:     Discharge planning Services  CM Consult, [facility]  Post Acute Care Choice:    Choice offered to:     DME Arranged:  N/A DME Agency:  NA  HH Arranged:  NA HH Agency:  NA  Status of Service:  Completed, signed off  If discussed at [place] of Stay Meetings, dates discussed:    Additional Comments:  Apolonio Schneiders, RN 10/28/2015, 11:47 AM

## 2015-10-29 ENCOUNTER — Encounter: Payer: Self-pay | Admitting: Oncology

## 2015-10-29 ENCOUNTER — Telehealth: Payer: Self-pay | Admitting: *Deleted

## 2015-10-29 ENCOUNTER — Other Ambulatory Visit: Payer: Self-pay | Admitting: *Deleted

## 2015-10-29 LAB — CULTURE, BLOOD (ROUTINE X 2)
CULTURE: NO GROWTH
Culture: NO GROWTH

## 2015-10-29 MED ORDER — PROCHLORPERAZINE MALEATE 10 MG PO TABS: 10.0000 mg | ORAL_TABLET | Freq: Four times a day (QID) | ORAL | 1 refills | Status: DC | PRN

## 2015-10-29 MED ORDER — PROCHLORPERAZINE MALEATE 10 MG PO TABS
10.0000 mg | ORAL_TABLET | Freq: Four times a day (QID) | ORAL | 1 refills | Status: DC | PRN
Start: 2015-10-29 — End: 2015-10-29

## 2015-10-29 NOTE — Progress Notes (Signed)
Began drug replacement application through [employer]. Will ask rn to about clinical dosage and pharmacy information then leave for her to have physician sign and return to me. Patient will need to provide household size/ income information and sign his portion.

## 2015-10-29 NOTE — Telephone Encounter (Signed)
VM message received from patient requesting prescription for nausea as he starts his chemotherapy (Carbo/gemzar) on Tuesday, 10/30/15.  He was told that a prescription would be called in and his pharmacy has told him they do not have it yet.

## 2015-10-29 NOTE — Progress Notes (Signed)
Received assistance for completing dosage section of [employer] Patient assistance. Left forms for doctor to sign and date on his desk with a note to return to me.

## 2015-10-30 ENCOUNTER — Ambulatory Visit (HOSPITAL_BASED_OUTPATIENT_CLINIC_OR_DEPARTMENT_OTHER): Payer: Medicaid Other

## 2015-10-30 ENCOUNTER — Other Ambulatory Visit (HOSPITAL_BASED_OUTPATIENT_CLINIC_OR_DEPARTMENT_OTHER): Payer: Medicaid Other

## 2015-10-30 ENCOUNTER — Ambulatory Visit (HOSPITAL_BASED_OUTPATIENT_CLINIC_OR_DEPARTMENT_OTHER): Payer: Medicaid Other | Admitting: Oncology

## 2015-10-30 ENCOUNTER — Telehealth: Payer: Self-pay | Admitting: Oncology

## 2015-10-30 VITALS — HR 96

## 2015-10-30 VITALS — BP 123/58 | HR 104 | Temp 99.4°F | Resp 19 | Wt 197.9 lb

## 2015-10-30 DIAGNOSIS — C659 Malignant neoplasm of unspecified renal pelvis: Secondary | ICD-10-CM

## 2015-10-30 DIAGNOSIS — C652 Malignant neoplasm of left renal pelvis: Secondary | ICD-10-CM

## 2015-10-30 DIAGNOSIS — C7800 Secondary malignant neoplasm of unspecified lung: Secondary | ICD-10-CM

## 2015-10-30 DIAGNOSIS — R509 Fever, unspecified: Secondary | ICD-10-CM

## 2015-10-30 DIAGNOSIS — Z5111 Encounter for antineoplastic chemotherapy: Secondary | ICD-10-CM

## 2015-10-30 LAB — COMPREHENSIVE METABOLIC PANEL
ALBUMIN: 1.6 g/dL — AB (ref 3.5–5.0)
ALK PHOS: 155 U/L — AB (ref 40–150)
ALT: 25 U/L (ref 0–55)
AST: 12 U/L (ref 5–34)
Anion Gap: 10 mEq/L (ref 3–11)
BUN: 20.5 mg/dL (ref 7.0–26.0)
CALCIUM: 9.3 mg/dL (ref 8.4–10.4)
CHLORIDE: 104 meq/L (ref 98–109)
CO2: 19 mEq/L — ABNORMAL LOW (ref 22–29)
CREATININE: 1.4 mg/dL — AB (ref 0.7–1.3)
EGFR: 67 mL/min/{1.73_m2} — ABNORMAL LOW (ref >90)
Glucose: 343 mg/dl — ABNORMAL HIGH (ref 70–140)
Potassium: 5.1 mEq/L (ref 3.5–5.1)
Sodium: 132 mEq/L — ABNORMAL LOW (ref 136–145)
Total Bilirubin: 0.88 mg/dL (ref 0.20–1.20)
Total Protein: 6.8 g/dL (ref 6.4–8.3)

## 2015-10-30 LAB — CBC WITH DIFFERENTIAL/PLATELET
BASO%: 0.9 % (ref 0.0–2.0)
Basophils Absolute: 0.2 10*3/uL — ABNORMAL HIGH (ref 0.0–0.1)
EOS%: 0.1 % (ref 0.0–7.0)
Eosinophils Absolute: 0 10*3/uL (ref 0.0–0.5)
HEMATOCRIT: 27.6 % — AB (ref 38.4–49.9)
HEMOGLOBIN: 8.8 g/dL — AB (ref 13.0–17.1)
LYMPH#: 2.2 10*3/uL (ref 0.9–3.3)
LYMPH%: 9.5 % — ABNORMAL LOW (ref 14.0–49.0)
MCH: 25.5 pg — ABNORMAL LOW (ref 27.2–33.4)
MCHC: 31.8 g/dL — ABNORMAL LOW (ref 32.0–36.0)
MCV: 80.4 fL (ref 79.3–98.0)
MONO#: 3.3 10*3/uL — ABNORMAL HIGH (ref 0.1–0.9)
MONO%: 14.2 % — ABNORMAL HIGH (ref 0.0–14.0)
NEUT#: 17.2 10*3/uL — ABNORMAL HIGH (ref 1.5–6.5)
NEUT%: 75.3 % — ABNORMAL HIGH (ref 39.0–75.0)
Platelets: 654 10*3/uL — ABNORMAL HIGH (ref 140–400)
RBC: 3.43 10*6/uL — ABNORMAL LOW (ref 4.20–5.82)
RDW: 17.7 % — AB (ref 11.0–14.6)
WBC: 22.8 10*3/uL — ABNORMAL HIGH (ref 4.0–10.3)

## 2015-10-30 LAB — CULTURE, BODY FLUID-BOTTLE: CULTURE: NO GROWTH

## 2015-10-30 LAB — TECHNOLOGIST REVIEW

## 2015-10-30 LAB — CULTURE, BODY FLUID W GRAM STAIN -BOTTLE

## 2015-10-30 MED ORDER — SODIUM CHLORIDE 0.9 % IV SOLN
10.0000 mg | Freq: Once | INTRAVENOUS | Status: AC
  Administered 2015-10-30: 10 mg via INTRAVENOUS
  Filled 2015-10-30: qty 1

## 2015-10-30 MED ORDER — CARBOPLATIN CHEMO INJECTION 600 MG/60ML
472.5000 mg | Freq: Once | INTRAVENOUS | Status: AC
  Administered 2015-10-30: 470 mg via INTRAVENOUS
  Filled 2015-10-30: qty 47

## 2015-10-30 MED ORDER — SODIUM CHLORIDE 0.9 % IV SOLN
1000.0000 mg/m2 | Freq: Once | INTRAVENOUS | Status: AC
  Administered 2015-10-30: 2090 mg via INTRAVENOUS
  Filled 2015-10-30: qty 54.97

## 2015-10-30 MED ORDER — PALONOSETRON HCL INJECTION 0.25 MG/5ML
INTRAVENOUS | Status: AC
  Filled 2015-10-30: qty 5

## 2015-10-30 MED ORDER — SODIUM CHLORIDE 0.9 % IV SOLN
Freq: Once | INTRAVENOUS | Status: AC
  Administered 2015-10-30: 15:00:00 via INTRAVENOUS

## 2015-10-30 MED ORDER — PALONOSETRON HCL INJECTION 0.25 MG/5ML
0.2500 mg | Freq: Once | INTRAVENOUS | Status: AC
  Administered 2015-10-30: 0.25 mg via INTRAVENOUS

## 2015-10-30 NOTE — Telephone Encounter (Signed)
Message sent to chemo scheduler to add infusion. Avs report and schedule given per 10/30/15 los.

## 2015-10-30 NOTE — Progress Notes (Signed)
Hematology and Oncology Follow Up Visit  David [name] [id number] Jan 21, 1961 55 y.o. 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 He presented with a 5 cm mass in the central left kidney as well as pulmonary metastasis, nodules and pleural effusion.   Prior Therapy: Status post biopsy obtained on 10/12/2015 which confirmed the presence of carcinoma.  Current therapy: Chemotherapy utilizing gemcitabine and carboplatin cycle one scheduled on 10/30/2015. He will receive day 1 of cycle 1 on 10/30/2015. Day 8 will be on 11/06/2015.  Interim History: David [name] presents today for a follow-up visit. Since the last visit, he was hospitalized between 10/23/2015 and 09/110/2017. He was evaluated for sepsis because of fever, tachypnea and tachycardia. He was also found to be anemic and received transfusion at that time.  He did have a thoracentesis done on 10/26/2015 although the fluid did not show any organism or malignancy. He was discharged on Augmentin to complete 1 week of therapy.  Since his discharge he is feeling reasonably well and his appetite is improved. He is no longer reporting any fevers or chills. He is not reporting any dyspnea on exertion. He is not reporting any hematochezia or melena. He is ready to proceed with chemotherapy.  He denied any headaches, blurry vision, syncope or seizures. He does report fevers but no chills.He does not report any chest pain, palpitation, orthopnea, leg edema or dyspnea on exertion. He denied any cough, wheezing or hemoptysis. He does not report any nausea, vomiting or early satiety. He did report constipation which have resolved. He denied any diarrhea. He does not report any frequency, urgency or hesitancy. He does not report any skeletal complaints. Remaining review of system is unremarkable.   Medications: I have reviewed the patient's current medications.   Current Outpatient Prescriptions  Medication Sig Dispense Refill  . amoxicillin-clavulanate (AUGMENTIN) 875-125 MG tablet Take 1 tablet by mouth 2 (two) times daily. 14 tablet 0  . carvedilol (COREG) 3.125 MG tablet Take 1 tablet (3.125 mg total) by mouth 2 (two) times daily with a meal. 60 tablet 0  . chlorproMAZINE (THORAZINE) 50 MG tablet Take 1 tablet (50 mg total) by mouth 3 (three) times daily as needed for hiccoughs. 90 tablet 0  . clonazePAM (KLONOPIN) 0.5 MG tablet 1 tablet po 1-2 times daily prn anxiety, caution due to sedation 15 tablet 0  . ferrous sulfate 325 (65 FE) MG tablet Take 1 tablet (325 mg total) by mouth 2 (two) times daily with a meal. 60 tablet 3  . HYDROcodone-acetaminophen (NORCO/VICODIN) 5-325 MG tablet Take 1 tablet by mouth every 6 (six) hours as needed for moderate pain. 15 tablet 0  . megestrol (MEGACE) 400 MG/10ML suspension Take 10 mLs (400 mg total) by mouth 2 (two) times daily. 240 mL 0  . metFORMIN (GLUCOPHAGE) 500 MG tablet Take 1 tablet (500 mg total) by mouth daily with breakfast. 30 tablet 11  . prochlorperazine (COMPAZINE) 10 MG tablet Take 1 tablet (10 mg total) by mouth every 6 (six) hours as needed for nausea or vomiting. 30 tablet 1  . vitamin B-12 (CYANOCOBALAMIN) 1000 MCG tablet Take 1 tablet (1,000 mcg total) by mouth daily. 30 tablet 0   No current facility-administered medications for this visit.      Allergies: No Known Allergies  Past Medical History, Surgical history, Social history, and Family History were reviewed and updated.   Physical Exam: Blood pressure (!) 123/58, pulse (!) 104,  temperature 99.4 F (37.4 C), temperature source Oral, resp. rate 19, weight 197 lb 14.4 oz (89.8 kg), SpO2 100 %. ECOG: 1 General appearance: Chronically ill-appearing gentleman without distress. Head: Normocephalic, without obvious abnormality no oral ulcers or lesions. Neck: no adenopathy Lymph nodes: Cervical, supraclavicular, and axillary nodes  normal. Heart:regular rate and rhythm, S1, S2 normal, no murmur, click, rub or gallop Lung:chest clear, no wheezing, rales, normal symmetric air entry Abdomin: soft, non-tender, without masses or organomegaly no rebound or guarding. EXT:no erythema, induration, or nodules   Lab Results: Lab Results  Component Value Date   WBC 22.8 (H) 10/30/2015   HGB 8.8 (L) 10/30/2015   HCT 27.6 (L) 10/30/2015   MCV 80.4 10/30/2015   PLT 654 (H) 10/30/2015     Chemistry      Component Value Date/Time   NA 132 (L) 10/30/2015 1246   K 5.1 10/30/2015 1246   CL 101 10/26/2015 0806   CO2 19 (L) 10/30/2015 1246   BUN 20.5 10/30/2015 1246   CREATININE 1.4 (H) 10/30/2015 1246      Component Value Date/Time   CALCIUM 9.3 10/30/2015 1246   ALKPHOS 155 (H) 10/30/2015 1246   AST 12 10/30/2015 1246   ALT 25 10/30/2015 1246   BILITOT 0.88 10/30/2015 1246        Impression and Plan:  55 year old gentleman with the following issues:  1. 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 He is status post biopsy of a pleural-based nodule which confirmed the presence of carcinoma although further immunohistochemical staining was not performed because of lack of tissue.  These findings confirm the presence of advanced malignancy likely of the genitourinary tract. He is planned to start salvage chemotherapy initially on 10/23/2015 and the treatment was delayed because of his recent hospitalization.  The plan is to proceed with day 1 cycle 1 of chemotherapy on 10/30/2015. He will receive carboplatin and gemcitabine today. Gemcitabine alone will be given on 11/06/2015 if his counts are adequate.  2. IV access: Risks and benefits of Port-A-Cath insertion were reviewed and this will be deferred to a later date after the start of chemotherapy.  3. Nausea prophylaxis: He has prescription for antiemetics available to him.  4. Hiccups: Seems to be  manageable with promethazine which is available to him at this time.  5. Weight loss: Seems to be improving at this time.  6. Fever: Likely related to his malignancy but an occult infection could be contributing. Seems to be improved on antibiotics. He is to finish a course of Augmentin.  7. Follow-up: Will be in one week for day 8 of cycle 1 of chemotherapy.  Zola Button, MD 9/12/20171:37 PM

## 2015-10-30 NOTE — Patient Instructions (Signed)
[facility] Discharge Instructions for Patients Receiving Chemotherapy  Today you received the following chemotherapy agents carboplatin and gemcitibine. To help prevent nausea and vomiting after your treatment, we encourage you to take your nausea medication .   If you develop nausea and vomiting that is not controlled by your nausea medication, call the clinic.   BELOW ARE SYMPTOMS THAT SHOULD BE REPORTED IMMEDIATELY:  *FEVER GREATER THAN 100.5 F  *CHILLS WITH OR WITHOUT FEVER  NAUSEA AND VOMITING THAT IS NOT CONTROLLED WITH YOUR NAUSEA MEDICATION  *UNUSUAL SHORTNESS OF BREATH  *UNUSUAL BRUISING OR BLEEDING  TENDERNESS IN MOUTH AND THROAT WITH OR WITHOUT PRESENCE OF ULCERS  *URINARY PROBLEMS  *BOWEL PROBLEMS  UNUSUAL RASH Items with * indicate a potential emergency and should be followed up as soon as possible.  Feel free to call the clinic you have any questions or concerns. The clinic phone number is [phone].  Please show the [facility] at check-in to the Emergency Department and triage nurse.

## 2015-10-31 ENCOUNTER — Encounter: Payer: Self-pay | Admitting: Oncology

## 2015-10-31 NOTE — Progress Notes (Signed)
Received approval for free medication for Gemzar through [facility] Patient Assistance Program 10/31/2015-10/30/2016. Called patient to inform of the approval and that he will receive confirmation in the mail and nothing further is needed from him. Patient verbalized understanding. Left form for doctor to sign on his desk. Signed form returned by RN.

## 2015-10-31 NOTE — Progress Notes (Signed)
Met with patient and spouse on 10/30/15 in the treatment area. Had patient to sign application for [employer] Patient One Assistance Program for possible drug replacement for Gemzar. Provided patient with a copy of the application to review. Faxed application and supporting income documents to [employer]. Fax received ok per confirmation sheet. Also approved patient for one-time $400 [employer]. Gave patient a copy of the approval as well as expenses it covers along with our Outpatient Pharmacy information. Advised patient he may want to just use the grant for oral medications only at this time being that he is uninsured and has prescriptions to be filled. Patient and spouse verbalized understanding. Account for grant has been set up in [place] and is ready to be used. Patient has my card for any additional financial questions or concerns.

## 2015-10-31 NOTE — Progress Notes (Signed)
Also in reviewing notes from recent hospital admission, patient met with financial counselor on hospital side Ivin Booty A. Whom conducted interview with patient and spouse and has started the process for the Medicaid application. They were given information to submit to complete the application. Patient has also applied for SS/Disability.

## 2015-11-02 ENCOUNTER — Emergency Department (HOSPITAL_COMMUNITY): Payer: Medicaid Other

## 2015-11-02 ENCOUNTER — Emergency Department (HOSPITAL_COMMUNITY)
Admission: EM | Admit: 2015-11-02 | Discharge: 2015-11-03 | Disposition: A | Discharge status: Home / Self Care | Payer: Medicaid Other | Attending: Emergency Medicine | Admitting: Emergency Medicine

## 2015-11-02 ENCOUNTER — Encounter (HOSPITAL_COMMUNITY): Payer: Self-pay

## 2015-11-02 DIAGNOSIS — Z79899 Other long term (current) drug therapy: Secondary | ICD-10-CM | POA: Diagnosis not present

## 2015-11-02 DIAGNOSIS — E875 Hyperkalemia: Secondary | ICD-10-CM

## 2015-11-02 DIAGNOSIS — E119 Type 2 diabetes mellitus without complications: Secondary | ICD-10-CM | POA: Insufficient documentation

## 2015-11-02 DIAGNOSIS — R1033 Periumbilical pain: Secondary | ICD-10-CM | POA: Diagnosis present

## 2015-11-02 DIAGNOSIS — Z85528 Personal history of other malignant neoplasm of kidney: Secondary | ICD-10-CM | POA: Diagnosis not present

## 2015-11-02 DIAGNOSIS — Z7984 Long term (current) use of oral hypoglycemic drugs: Secondary | ICD-10-CM | POA: Diagnosis not present

## 2015-11-02 DIAGNOSIS — Z87891 Personal history of nicotine dependence: Secondary | ICD-10-CM | POA: Insufficient documentation

## 2015-11-02 DIAGNOSIS — R140 Abdominal distension (gaseous): Secondary | ICD-10-CM

## 2015-11-02 DIAGNOSIS — D638 Anemia in other chronic diseases classified elsewhere: Secondary | ICD-10-CM

## 2015-11-02 DIAGNOSIS — R109 Unspecified abdominal pain: Secondary | ICD-10-CM

## 2015-11-02 HISTORY — DX: Malignant neoplasm of unspecified kidney, except renal pelvis: C64.9

## 2015-11-02 LAB — COMPREHENSIVE METABOLIC PANEL
ALK PHOS: 132 U/L — AB (ref 38–126)
ALT: 48 U/L (ref 17–63)
ANION GAP: 8 (ref 5–15)
AST: 34 U/L (ref 15–41)
Albumin: 2.3 g/dL — ABNORMAL LOW (ref 3.5–5.0)
BUN: 35 mg/dL — ABNORMAL HIGH (ref 6–20)
CALCIUM: 8.7 mg/dL — AB (ref 8.9–10.3)
CHLORIDE: 101 mmol/L (ref 101–111)
CO2: 20 mmol/L — AB (ref 22–32)
Creatinine, Ser: 1.53 mg/dL — ABNORMAL HIGH (ref 0.61–1.24)
GFR calc non Af Amer: 49 mL/min — ABNORMAL LOW (ref >60)
GFR, EST AFRICAN AMERICAN: 57 mL/min — AB (ref >60)
GLUCOSE: 320 mg/dL — AB (ref 65–99)
POTASSIUM: 5.4 mmol/L — AB (ref 3.5–5.1)
SODIUM: 129 mmol/L — AB (ref 135–145)
Total Bilirubin: 1.2 mg/dL (ref 0.3–1.2)
Total Protein: 7.3 g/dL (ref 6.5–8.1)

## 2015-11-02 LAB — CBC WITH DIFFERENTIAL/PLATELET
Basophils Absolute: 0 10*3/uL (ref 0.0–0.1)
Basophils Relative: 0 %
Eosinophils Absolute: 0 10*3/uL (ref 0.0–0.7)
Eosinophils Relative: 0 %
HEMATOCRIT: 24.3 % — AB (ref 39.0–52.0)
HEMOGLOBIN: 8.1 g/dL — AB (ref 13.0–17.0)
LYMPHS ABS: 1.6 10*3/uL (ref 0.7–4.0)
LYMPHS PCT: 9 %
MCH: 26.1 pg (ref 26.0–34.0)
MCHC: 33.3 g/dL (ref 30.0–36.0)
MCV: 78.4 fL (ref 78.0–100.0)
MONO ABS: 0.1 10*3/uL (ref 0.1–1.0)
MONOS PCT: 0 %
NEUTROS ABS: 16.2 10*3/uL — AB (ref 1.7–7.7)
NEUTROS PCT: 91 %
Platelets: 475 10*3/uL — ABNORMAL HIGH (ref 150–400)
RBC: 3.1 MIL/uL — ABNORMAL LOW (ref 4.22–5.81)
RDW: 16.8 % — AB (ref 11.5–15.5)
WBC: 17.9 10*3/uL — ABNORMAL HIGH (ref 4.0–10.5)

## 2015-11-02 LAB — URINALYSIS, ROUTINE W REFLEX MICROSCOPIC
GLUCOSE, UA: 500 mg/dL — AB
KETONES UR: NEGATIVE mg/dL
Leukocytes, UA: NEGATIVE
NITRITE: NEGATIVE
PH: 5.5 (ref 5.0–8.0)
Protein, ur: 100 mg/dL — AB
SPECIFIC GRAVITY, URINE: 1.024 (ref 1.005–1.030)

## 2015-11-02 LAB — POTASSIUM: POTASSIUM: 5.4 mmol/L — AB (ref 3.5–5.1)

## 2015-11-02 LAB — I-STAT CG4 LACTIC ACID, ED: Lactic Acid, Venous: 0.87 mmol/L (ref 0.5–1.9)

## 2015-11-02 LAB — URINE MICROSCOPIC-ADD ON

## 2015-11-02 MED ORDER — SODIUM CHLORIDE 0.9 % IV BOLUS (SEPSIS)
1000.0000 mL | Freq: Once | INTRAVENOUS | Status: AC
  Administered 2015-11-02: 1000 mL via INTRAVENOUS

## 2015-11-02 MED ORDER — IOPAMIDOL (ISOVUE-300) INJECTION 61%
15.0000 mL | Freq: Once | INTRAVENOUS | Status: AC | PRN
  Administered 2015-11-02: 15 mL via ORAL

## 2015-11-02 MED ORDER — ONDANSETRON HCL 4 MG/2ML IJ SOLN
4.0000 mg | Freq: Once | INTRAMUSCULAR | Status: AC
  Administered 2015-11-02: 4 mg via INTRAVENOUS
  Filled 2015-11-02: qty 2

## 2015-11-02 MED ORDER — ACETAMINOPHEN 325 MG PO TABS
650.0000 mg | ORAL_TABLET | Freq: Once | ORAL | Status: AC
  Administered 2015-11-02: 650 mg via ORAL
  Filled 2015-11-02: qty 2

## 2015-11-02 MED ORDER — FENTANYL CITRATE (PF) 100 MCG/2ML IJ SOLN
50.0000 ug | Freq: Once | INTRAMUSCULAR | Status: AC
  Administered 2015-11-02: 50 ug via INTRAVENOUS
  Filled 2015-11-02: qty 2

## 2015-11-02 MED ORDER — IOPAMIDOL (ISOVUE-300) INJECTION 61%: 100.0000 mL | Freq: Once | INTRAVENOUS | Status: DC | PRN

## 2015-11-02 NOTE — ED Triage Notes (Signed)
Patient c/o abdominal distention that began today. Patient reports BM 2 days ago and was normal. Patient denies N/V.

## 2015-11-02 NOTE — ED Notes (Signed)
Pt stated that he had BM shortly after PA pressed on his belly during exam

## 2015-11-02 NOTE — ED Notes (Signed)
Pt attempted to provide urine sample but was unsuccessful 

## 2015-11-02 NOTE — ED Notes (Signed)
Bed: [facility]1 Expected date:  Expected time:  Means of arrival:  Comments: 

## 2015-11-02 NOTE — ED Provider Notes (Signed)
[facility] DEPT Provider Note   CSN: [id number] Arrival date & time: 11/02/15  1812  By signing my name below, I, David [name], attest that this documentation has been prepared under the direction and in the presence of  [employer], [place]. Electronically Signed: Royce [name], ED Scribe. 11/03/15. 4:39 AM.  History   Chief Complaint Chief Complaint  Patient presents with  . abdominal distention  . Constipation  . chemo card  . Abdominal Pain  The history is provided by the patient and medical records. No language interpreter was used.    HPI Comments:  David [name] is a 55 y.o. male with a history of metastatic renal cell cancer who presents to the Emergency Department complaining of abdominal distension and pain beginning today. He states his pain begins in his periumbilical region and radiates around his abdomen to his back. He reports that nothing makes his pain worse and states he has never had this sensation before. He has taken nothing for his discomfort and is not on any pain medications. He notes that he feels bloated and has seen an increase in the size of his abdomen. He has had anorexia, but this is not new for him. Today he ate some cereal and cabbage. He cannot recall when he last passed flatus. Last BM 2 days ago. He is currently taking antibiotics and has two days left. He denies chest pain, nausea and vomiting. He also complains of weakness but states it was present before he started chemo and that there is no significant increase. First infusion was 10/30/15.   Past Medical History:  Diagnosis Date  . Cancer ([place])   . Dental caries   . Diabetes mellitus, type II ([place]) 01/2012  . Hyperlipidemia   . Influenza vaccine refused 01/18/2013  . Kidney cancer, primary, with metastasis from kidney to other site [facility])   . Obesity   . Pneumococcal vaccine refused 01/18/2013  . Scoliosis   . Tobacco use   . Vision impairment    left, due to prior trauma     Patient Active Problem List   Diagnosis Date Noted  . SIRS (systemic inflammatory response syndrome) (HCC)   . S/P thoracentesis   . Fever, unspecified 10/25/2015  . Pleural effusion on right 10/25/2015  . Pleural mass 10/25/2015  . Symptomatic anemia 10/24/2015  . 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 Abnormal CT of the chest 10/03/2015  . Screening for prostate cancer 10/03/2015  . Creatinine elevation 10/03/2015  . Pulmonary nodules 10/03/2015  . Loss of weight 10/03/2015  . Lymphadenopathy 10/03/2015  . History of colonic polyps 10/03/2015  . Leukocytosis 10/03/2015  . Anemia, unspecified 10/03/2015  . Former smoker 10/03/2015  . Type 2 diabetes mellitus with complication, without long-term current use of insulin ([place]) 01/18/2013  . Obesity, unspecified 01/18/2013    Past Surgical History:  Procedure Laterality Date  . COLONOSCOPY W/ POLYPECTOMY    . EYE SURGERY  1996   due to trauma, left eye     Home Medications    Prior to Admission medications   Medication Sig Start Date End Date Taking? Authorizing Provider  amoxicillin-clavulanate (AUGMENTIN) 875-125 MG tablet Take 1 tablet by mouth 2 (two) times daily. 10/28/15  Yes Debbe Odea, MD  carvedilol (COREG) 3.125 MG tablet Take 1 tablet (3.125 mg total) by mouth 2 (two) times daily with a meal. 10/28/15  Yes Debbe Odea, MD  chlorproMAZINE (THORAZINE) 50 MG tablet Take 1 tablet (50 mg total) by mouth  3 (three) times daily as needed for hiccoughs. 10/28/15  Yes Debbe Odea, MD  ferrous sulfate 325 (65 FE) MG tablet Take 1 tablet (325 mg total) by mouth 2 (two) times daily with a meal. 10/28/15  Yes Debbe Odea, MD  megestrol (MEGACE) 400 MG/10ML suspension Take 10 mLs (400 mg total) by mouth 2 (two) times daily. 10/18/15  Yes Wyatt Portela, MD  metFORMIN (GLUCOPHAGE) 500 MG tablet Take 1 tablet (500 mg total) by mouth daily with breakfast. 10/28/15  Yes Debbe Odea, MD  vitamin B-12 (CYANOCOBALAMIN)  1000 MCG tablet Take 1 tablet (1,000 mcg total) by mouth daily. 10/28/15  Yes Debbe Odea, MD  clonazePAM (KLONOPIN) 0.5 MG tablet 1 tablet po 1-2 times daily prn anxiety, caution due to sedation 10/04/15   Camelia Eng Tysinger, PA-C  HYDROcodone-acetaminophen (NORCO/VICODIN) 5-325 MG tablet Take 1 tablet by mouth every 6 (six) hours as needed for moderate pain. 11/03/15   Antonietta Breach, PA-C  prochlorperazine (COMPAZINE) 10 MG tablet Take 1 tablet (10 mg total) by mouth every 6 (six) hours as needed for nausea or vomiting. 10/29/15   Maryanna Shape, NP    Family History Family History  Problem Relation Age of Onset  . Diabetes Sister   . Other Sister     gastric bypass  . Hyperlipidemia Mother   . Diabetes Mother   . Cancer Father 59    prostate cancer  . Stroke Maternal Grandmother   . Heart disease Neg Hx   . Hypertension Neg Hx   . Colon cancer Neg Hx     Social History Social History  Substance Use Topics  . Smoking status: Former Smoker    Packs/day: 0.25    Years: 20.00    Types: Cigarettes  . Smokeless tobacco: Never Used  . Alcohol use No     Allergies   Review of patient's allergies indicates no known allergies.   Review of Systems Review of Systems 10 systems reviewed and all are negative for acute change except as noted in the HPI.   Physical Exam Updated Vital Signs BP 126/87   Pulse 93   Temp 100.2 F (37.9 C) (Rectal)   Resp 15   Ht 5\' 9"  (1.753 m)   Wt 89.4 kg   SpO2 98%   BMI 29.09 kg/m   Physical Exam  Constitutional: He is oriented to person, place, and time. He appears well-developed and well-nourished. No distress.  Nontoxic appearing  HENT:  Head: Normocephalic and atraumatic.  Eyes: Conjunctivae and EOM are normal. No scleral icterus.  Neck: Normal range of motion.  Cardiovascular: Regular rhythm and intact distal pulses.   Mild tachycardia  Pulmonary/Chest: Effort normal. No respiratory distress. He has no wheezes. He has no rales.   Respirations even and unlabored  Abdominal: Soft. He exhibits no mass. There is tenderness. There is no guarding.  Soft, mildly distended abdomen with diffuse TTP. No focal TTP, guarding, or peritoneal signs.  Musculoskeletal: Normal range of motion.  Neurological: He is alert and oriented to person, place, and time.  GCS 15. Patient moving all extremities.  Skin: Skin is warm and dry. No rash noted. He is not diaphoretic. No erythema. No pallor.  Psychiatric: He has a normal mood and affect. His behavior is normal.  Nursing note and vitals reviewed.    ED Treatments / Results   DIAGNOSTIC STUDIES:  Oxygen Saturation is 97% on RA, NML by my interpretation.    COORDINATION OF CARE:  8:37 PM  Discussed treatment plan with pt at bedside and pt agreed to plan.  9:05 PM Patient reportedly has been able to have a BM while in the ED  Labs (all labs ordered are listed, but only abnormal results are displayed) Labs Reviewed  COMPREHENSIVE METABOLIC PANEL - Abnormal; Notable for the following:       Result Value   Sodium 129 (*)    Potassium 5.4 (*)    CO2 20 (*)    Glucose, Bld 320 (*)    BUN 35 (*)    Creatinine, Ser 1.53 (*)    Calcium 8.7 (*)    Albumin 2.3 (*)    Alkaline Phosphatase 132 (*)    GFR calc non Af Amer 49 (*)    GFR calc Af Amer 57 (*)    All other components within normal limits  CBC WITH DIFFERENTIAL/PLATELET - Abnormal; Notable for the following:    WBC 17.9 (*)    RBC 3.10 (*)    Hemoglobin 8.1 (*)    HCT 24.3 (*)    RDW 16.8 (*)    Platelets 475 (*)    Neutro Abs 16.2 (*)    All other components within normal limits  URINALYSIS, ROUTINE W REFLEX MICROSCOPIC (NOT AT [facility]) - Abnormal; Notable for the following:    Color, Urine AMBER (*)    APPearance CLOUDY (*)    Glucose, UA 500 (*)    Hgb urine dipstick LARGE (*)    Bilirubin Urine SMALL (*)    Protein, ur 100 (*)    All other components within normal limits  POTASSIUM - Abnormal; Notable for  the following:    Potassium 5.4 (*)    All other components within normal limits  URINE MICROSCOPIC-ADD ON - Abnormal; Notable for the following:    Squamous Epithelial / LPF 0-5 (*)    Bacteria, UA FEW (*)    All other components within normal limits  CULTURE, BLOOD (ROUTINE X 2)  I-STAT CG4 LACTIC ACID, ED    EKG  EKG Interpretation None       Radiology Ct Abdomen Pelvis Wo Contrast  Result Date: 11/03/2015 CLINICAL DATA:  Acute onset of generalized abdominal distention. Initial encounter. EXAM: CT ABDOMEN AND PELVIS WITHOUT CONTRAST TECHNIQUE: Multidetector CT imaging of the abdomen and pelvis was performed following the standard protocol without IV contrast. COMPARISON:  CT of the abdomen and pelvis performed 09/30/2015 FINDINGS: Lower chest: Diffuse nodular pleural based density is again noted at the right lung base, apparently reflecting the patient's known pulmonary metastases. This is more diffuse than on the prior study. The underlying pleural effusion has largely resolved. Additional scattered small bilateral nodules likely reflect metastatic disease. The visualized portions of the mediastinum are unremarkable in appearance. Hepatobiliary: The liver is unremarkable in appearance. The gallbladder is unremarkable. The common bile duct remains normal in caliber. Pancreas: The pancreas is within normal limits. Spleen: The spleen is unremarkable in appearance. Adrenals/Urinary Tract: The previously noted ill-defined mass at the left renal hilum is again noted, though less well characterized without contrast. Nonspecific stranding is noted about the left kidney, somewhat more prominent, raising question for superimposed infection. The right kidney is grossly unremarkable. No renal or ureteral stones are identified. Adjacent mildly prominent nodes are seen. Stomach/Bowel: The stomach is grossly unremarkable in appearance. The small bowel is within normal limits. The appendix remains normal in  caliber, without evidence of appendicitis. The colon is grossly unremarkable in appearance. Vascular/Lymphatic: Mild calcification is noted at  the aortic bifurcation. No retroperitoneal lymphadenopathy is seen. No pelvic sidewall lymphadenopathy is identified. Reproductive: The prostate appears largely absent. The bladder is moderately distended and grossly unremarkable in appearance. Other: No additional soft tissue abnormalities are seen. Musculoskeletal: No acute osseous abnormalities are identified. The visualized musculature is unremarkable in appearance. IMPRESSION: 1. Ill-defined mass again noted at the left renal hilum, less well characterized without contrast. Associated nonspecific stranding about the left kidney. This is somewhat more prominent than on the prior study, raising question for superimposed infection. 2. Diffuse nodular pleural-based density again noted at the right lung base, apparently reflecting the patient's known pulmonary metastases. These are more diffuse than on the prior study, though the underlying pleural effusion has largely resolved. Additional scattered bilateral pulmonary nodules also seen. Electronically Signed   By: Garald Balding M.D.   On: 11/03/2015 01:41   Dg Chest 2 View  Result Date: 11/02/2015 CLINICAL DATA:  Abdominal pain, weakness for 2 days. EXAM: CHEST  2 VIEW COMPARISON:  Chest radiographs 10/25/2015, 10/24/2015. Chest CT 09/30/2015 FINDINGS: Small residual pleural fluid in the right lung involving the fissures is stable from prior exam. Associated right basilar opacity is likely atelectasis but nonspecific. No left pleural effusion or focal consolidation. Unchanged cardiomediastinal contours with left suprahilar prominence, corresponding to soft tissue mass on CT. No pneumothorax. No pulmonary edema. IMPRESSION: Right pleural fluid is similar to recent prior radiographs. Right basilar opacity is nonspecific, favored atelectasis. No new abnormality is seen.  Electronically Signed   By: Jeb Levering M.D.   On: 11/02/2015 23:12   Dg Abd 2 Views  Result Date: 11/02/2015 CLINICAL DATA:  Abdominal and pelvic pain for 2 days. EXAM: ABDOMEN - 2 VIEW COMPARISON:  None. FINDINGS: The bowel gas pattern is normal. There is no evidence of free air. No radio-opaque calculi or other significant radiographic abnormality is seen. IMPRESSION: Negative. Electronically Signed   By: Margarette Canada M.D.   On: 11/02/2015 21:29    Pertinent labs & imaging results that were available during my care of the patient were reviewed by me and considered in my medical decision making (see chart for details).   Procedures Procedures (including critical care time)  Medications Ordered in ED Medications  sodium chloride 0.9 % bolus 1,000 mL (0 mLs Intravenous Stopped 11/02/15 2110)  ondansetron (ZOFRAN) injection 4 mg (4 mg Intravenous Given 11/02/15 2040)  fentaNYL (SUBLIMAZE) injection 50 mcg (50 mcg Intravenous Given 11/02/15 2040)  sodium chloride 0.9 % bolus 1,000 mL (0 mLs Intravenous Stopped 11/03/15 0000)  acetaminophen (TYLENOL) tablet 650 mg (650 mg Oral Given 11/02/15 2238)  iopamidol (ISOVUE-300) 61 % injection 15 mL (15 mLs Oral Contrast Given 11/02/15 2355)     Initial Impression / Assessment and Plan / ED Course    55 year old male with a history of metastatic renal cell cancer presents to the emergency department for evaluation of abdominal discomfort and bloating. He was discharged from the hospital on 10/28/2015 after a 5 day admission for weakness, anemia of chronic disease, and pleural effusions secondary to his cancer metastasis.  Patient nontoxic and nonseptic appearing on arrival. His vital signs have been stable. Patient with low-grade temperature while in the emergency department. Patient was given Tylenol for this. Leukocytosis is at baseline. Patient with a normal lactic acid level. Laboratory workup is, otherwise, reassuring and consistent with prior  values.  Patient with x-ray of abdomen and chest which appear stable compared to prior imaging. CT abdomen pelvis also obtained  to evaluate for obstruction versus ascites versus other acute abnormality. CT findings consistent with known history of metastatic renal cancer. There is nonspecific stranding around the left kidney which, I interpret, to be secondary to his cancer mass. Low suspicion for acute infection as urinalysis is reassuring. Patient has also previously been on a 2 week course of ciprofloxacin followed by a one-week course of Augmentin. He is, currently, still on antibiotics.  Low suspicion for emergent cause of symptoms today. Question whether low grade fever is response to known cancer. Patient with multiple reassuring cultures during his most recent admission with discharge 5 days ago. Pleural effusion was exudative with negative culture growth.  I do not believe further workup is indicated at this time. The patient has been instructed to follow-up with his primary care doctor as well as his oncologist regarding his visit to the emergency department today. Patient has been seen and evaluated, also, by my attending, Dr. Jeneen Rinks, who is in agreement with this workup, assessment, management plan, and patient's stability for discharge. Return precautions discussed at length at bedside and provided. Patient and family agreeable to plan with no unaddressed concerns. Patient discharged in stable condition.   Final Clinical Impressions(s) / ED Diagnoses   Final diagnoses:  Abdominal bloating  Anemia of chronic disease  Hyperkalemia    New Prescriptions Discharge Medication List as of 11/03/2015  2:38 AM      I personally performed the services described in this documentation, which was scribed in my presence. The recorded information has been reviewed and is accurate.       Antonietta Breach, PA-C 11/03/15 0448    Tanna Furry, MD 11/06/15 [phone]

## 2015-11-03 MED ORDER — HYDROCODONE-ACETAMINOPHEN 5-325 MG PO TABS: 1.0000 | ORAL_TABLET | Freq: Four times a day (QID) | ORAL | 0 refills | Status: DC | PRN

## 2015-11-03 NOTE — ED Provider Notes (Signed)
Pt seen and evaluated.  D/W Antonietta Breach PA-C.  Pt c/o abdominal bloating.  Benign exam without TTP.  Leukocytosis at 17K, but pt with persistent elevated WBC.  Not Neutropenic.  CXR without abnormality.  Lactate Normal.  Recommend CT.  If normal, pt appropriate for out patient follow up.   Tanna Furry, MD 11/03/15 [phone]

## 2015-11-03 NOTE — ED Notes (Signed)
Pt in CT.

## 2015-11-03 NOTE — Discharge Instructions (Signed)
Continue taking the antibiotics you were prescribed at discharge from your recent hospital stay. Try and remain well hydrated by drinking plenty of fluids. If you have a decreased appetite, try and supplement your diet with protein shakes. Follow up with your primary care doctor and your oncologist regarding your ED visit today. You may return for any new or concerning symptoms.

## 2015-11-06 ENCOUNTER — Encounter: Payer: Self-pay | Admitting: *Deleted

## 2015-11-06 ENCOUNTER — Telehealth: Payer: Self-pay | Admitting: *Deleted

## 2015-11-06 ENCOUNTER — Other Ambulatory Visit: Payer: Self-pay | Admitting: *Deleted

## 2015-11-06 ENCOUNTER — Other Ambulatory Visit (HOSPITAL_BASED_OUTPATIENT_CLINIC_OR_DEPARTMENT_OTHER): Payer: Medicaid Other

## 2015-11-06 ENCOUNTER — Ambulatory Visit (HOSPITAL_BASED_OUTPATIENT_CLINIC_OR_DEPARTMENT_OTHER): Payer: Medicaid Other

## 2015-11-06 VITALS — BP 119/78 | HR 96 | Temp 98.2°F | Resp 16

## 2015-11-06 DIAGNOSIS — C659 Malignant neoplasm of unspecified renal pelvis: Secondary | ICD-10-CM

## 2015-11-06 DIAGNOSIS — C652 Malignant neoplasm of left renal pelvis: Secondary | ICD-10-CM

## 2015-11-06 DIAGNOSIS — Z5111 Encounter for antineoplastic chemotherapy: Secondary | ICD-10-CM | POA: Diagnosis not present

## 2015-11-06 LAB — CBC WITH DIFFERENTIAL/PLATELET
BASO%: 0.5 % (ref 0.0–2.0)
Basophils Absolute: 0.1 10*3/uL (ref 0.0–0.1)
EOS%: 0.1 % (ref 0.0–7.0)
Eosinophils Absolute: 0 10*3/uL (ref 0.0–0.5)
HCT: 22.5 % — ABNORMAL LOW (ref 38.4–49.9)
HGB: 7.2 g/dL — ABNORMAL LOW (ref 13.0–17.1)
LYMPH%: 13.9 % — AB (ref 14.0–49.0)
MCH: 25.2 pg — ABNORMAL LOW (ref 27.2–33.4)
MCHC: 32.2 g/dL (ref 32.0–36.0)
MCV: 78.4 fL — ABNORMAL LOW (ref 79.3–98.0)
MONO#: 1.1 10*3/uL — ABNORMAL HIGH (ref 0.1–0.9)
MONO%: 9.6 % (ref 0.0–14.0)
NEUT%: 75.9 % — ABNORMAL HIGH (ref 39.0–75.0)
NEUTROS ABS: 8.8 10*3/uL — AB (ref 1.5–6.5)
Platelets: 272 10*3/uL (ref 140–400)
RBC: 2.87 10*6/uL — AB (ref 4.20–5.82)
RDW: 18.2 % — ABNORMAL HIGH (ref 11.0–14.6)
WBC: 11.5 10*3/uL — AB (ref 4.0–10.3)
lymph#: 1.6 10*3/uL (ref 0.9–3.3)

## 2015-11-06 LAB — COMPREHENSIVE METABOLIC PANEL
ALBUMIN: 1.7 g/dL — AB (ref 3.5–5.0)
ALK PHOS: 153 U/L — AB (ref 40–150)
ALT: 33 U/L (ref 0–55)
AST: 18 U/L (ref 5–34)
Anion Gap: 10 mEq/L (ref 3–11)
BUN: 22 mg/dL (ref 7.0–26.0)
CO2: 17 mEq/L — ABNORMAL LOW (ref 22–29)
CREATININE: 1.1 mg/dL (ref 0.7–1.3)
Calcium: 9.4 mg/dL (ref 8.4–10.4)
Chloride: 103 mEq/L (ref 98–109)
EGFR: 89 mL/min/{1.73_m2} — ABNORMAL LOW (ref >90)
GLUCOSE: 235 mg/dL — AB (ref 70–140)
POTASSIUM: 5 meq/L (ref 3.5–5.1)
SODIUM: 130 meq/L — AB (ref 136–145)
TOTAL PROTEIN: 7 g/dL (ref 6.4–8.3)
Total Bilirubin: 0.77 mg/dL (ref 0.20–1.20)

## 2015-11-06 MED ORDER — SODIUM CHLORIDE 0.9 % IV SOLN
1000.0000 mg/m2 | Freq: Once | INTRAVENOUS | Status: AC
  Administered 2015-11-06: 2090 mg via INTRAVENOUS
  Filled 2015-11-06: qty 54.97

## 2015-11-06 MED ORDER — SODIUM CHLORIDE 0.9 % IV SOLN
Freq: Once | INTRAVENOUS | Status: AC
  Administered 2015-11-06: 11:00:00 via INTRAVENOUS

## 2015-11-06 MED ORDER — PROCHLORPERAZINE MALEATE 10 MG PO TABS
ORAL_TABLET | ORAL | Status: AC
  Filled 2015-11-06: qty 1

## 2015-11-06 MED ORDER — MEGESTROL ACETATE 400 MG/10ML PO SUSP: 400.0000 mg | Freq: Two times a day (BID) | ORAL | 1 refills | Status: DC

## 2015-11-06 MED ORDER — PROCHLORPERAZINE MALEATE 10 MG PO TABS
10.0000 mg | ORAL_TABLET | Freq: Once | ORAL | Status: AC
  Administered 2015-11-06: 10 mg via ORAL

## 2015-11-06 MED FILL — MEGESTROL ACET 40 MG/ML SUS: 40 | 12 days supply | Qty: 240 | Fill #0

## 2015-11-06 NOTE — Progress Notes (Signed)
Work excuse letter for wife hand delivered to patient in infusion room

## 2015-11-06 NOTE — Patient Instructions (Signed)
[facility] Discharge Instructions for Patients Receiving Chemotherapy  Today you received the following chemotherapy agents Gemzar  To help prevent nausea and vomiting after your treatment, we encourage you to take your nausea medication as directed.    If you develop nausea and vomiting that is not controlled by your nausea medication, call the clinic.   BELOW ARE SYMPTOMS THAT SHOULD BE REPORTED IMMEDIATELY:  *FEVER GREATER THAN 100.5 F  *CHILLS WITH OR WITHOUT FEVER  NAUSEA AND VOMITING THAT IS NOT CONTROLLED WITH YOUR NAUSEA MEDICATION  *UNUSUAL SHORTNESS OF BREATH  *UNUSUAL BRUISING OR BLEEDING  TENDERNESS IN MOUTH AND THROAT WITH OR WITHOUT PRESENCE OF ULCERS  *URINARY PROBLEMS  *BOWEL PROBLEMS  UNUSUAL RASH Items with * indicate a potential emergency and should be followed up as soon as possible.  Feel free to call the clinic you have any questions or concerns. The clinic phone number is [phone].  Please show the CHEMO ALERT CARD at check-in to the Emergency Department and triage nurse.   

## 2015-11-06 NOTE — Telephone Encounter (Signed)
No note

## 2015-11-06 NOTE — Progress Notes (Signed)
Ok to treat with Hemoglobin of 7.2 per MD [facility]

## 2015-11-08 ENCOUNTER — Encounter: Payer: Self-pay | Admitting: *Deleted

## 2015-11-08 ENCOUNTER — Encounter: Payer: Self-pay | Admitting: Oncology

## 2015-11-08 ENCOUNTER — Telehealth: Payer: Self-pay | Admitting: *Deleted

## 2015-11-08 LAB — CULTURE, BLOOD (ROUTINE X 2): CULTURE: NO GROWTH

## 2015-11-08 NOTE — Progress Notes (Signed)
Faxed Dose Tracking and Certificate of Brand Drug Usage to [employer]. Fax received ok per confirmation sheet.

## 2015-11-08 NOTE — Telephone Encounter (Signed)
Please make sure she gets the letter she needs.

## 2015-11-08 NOTE — Telephone Encounter (Signed)
Patient's wife needs a work Geographical information systems officer. She currently drives a school bus. Morning and evenings. She can do mornings, but is unable to do evenings. She must be at home to care for mr Otter, from 11/12/15 thru 11/30/15. After this date she has arranged for help in the evenings.

## 2015-11-08 NOTE — Telephone Encounter (Signed)
Letter done. LM on answering machine for Mrs. Grider, that letter may be picked up, at the front desk

## 2015-11-09 ENCOUNTER — Encounter: Payer: Self-pay | Admitting: Oncology

## 2015-11-09 NOTE — Progress Notes (Signed)
Received call from [facility] needing a second vial number listed on the certificate. Received information from [place] in pharmacy and faxed back to [facility]. Fax received ok per confirmation sheet.

## 2015-11-09 NOTE — Progress Notes (Signed)
Received Administration verification form from [employer]. Form completed by pharmacy and delivered to physician's desk for signature. Signed form returned to me. I faxed form to [place] [facility]. Fax received ok per confirmation sheet.

## 2015-11-13 ENCOUNTER — Ambulatory Visit: Payer: Self-pay

## 2015-11-13 ENCOUNTER — Other Ambulatory Visit: Payer: Self-pay

## 2015-11-13 ENCOUNTER — Ambulatory Visit: Payer: Self-pay | Admitting: Oncology

## 2015-11-14 ENCOUNTER — Telehealth: Payer: Self-pay | Admitting: *Deleted

## 2015-11-14 ENCOUNTER — Observation Stay (HOSPITAL_COMMUNITY)
Admission: EM | Admit: 2015-11-14 | Discharge: 2015-11-15 | Disposition: A | Discharge status: Home / Self Care | Payer: Medicaid Other | Attending: Internal Medicine | Admitting: Internal Medicine

## 2015-11-14 ENCOUNTER — Other Ambulatory Visit: Payer: Self-pay

## 2015-11-14 ENCOUNTER — Encounter (HOSPITAL_COMMUNITY): Payer: Self-pay

## 2015-11-14 ENCOUNTER — Ambulatory Visit (INDEPENDENT_AMBULATORY_CARE_PROVIDER_SITE_OTHER): Payer: Medicaid Other | Admitting: Medical

## 2015-11-14 ENCOUNTER — Encounter: Payer: Self-pay | Admitting: Medical

## 2015-11-14 VITALS — BP 114/76 | HR 100 | Temp 98.9°F | Resp 12 | Ht 69.0 in | Wt 176.8 lb

## 2015-11-14 DIAGNOSIS — E109 Type 1 diabetes mellitus without complications: Secondary | ICD-10-CM | POA: Insufficient documentation

## 2015-11-14 DIAGNOSIS — E43 Unspecified severe protein-calorie malnutrition: Secondary | ICD-10-CM | POA: Insufficient documentation

## 2015-11-14 DIAGNOSIS — E46 Unspecified protein-calorie malnutrition: Secondary | ICD-10-CM | POA: Diagnosis present

## 2015-11-14 DIAGNOSIS — C659 Malignant neoplasm of unspecified renal pelvis: Secondary | ICD-10-CM | POA: Diagnosis not present

## 2015-11-14 DIAGNOSIS — R222 Localized swelling, mass and lump, trunk: Secondary | ICD-10-CM

## 2015-11-14 DIAGNOSIS — E8809 Other disorders of plasma-protein metabolism, not elsewhere classified: Secondary | ICD-10-CM | POA: Diagnosis present

## 2015-11-14 DIAGNOSIS — R634 Abnormal weight loss: Secondary | ICD-10-CM | POA: Diagnosis not present

## 2015-11-14 DIAGNOSIS — D72829 Elevated white blood cell count, unspecified: Secondary | ICD-10-CM

## 2015-11-14 DIAGNOSIS — C649 Malignant neoplasm of unspecified kidney, except renal pelvis: Secondary | ICD-10-CM

## 2015-11-14 DIAGNOSIS — E860 Dehydration: Secondary | ICD-10-CM | POA: Diagnosis present

## 2015-11-14 DIAGNOSIS — Z9889 Other specified postprocedural states: Secondary | ICD-10-CM

## 2015-11-14 DIAGNOSIS — Z85528 Personal history of other malignant neoplasm of kidney: Secondary | ICD-10-CM | POA: Diagnosis not present

## 2015-11-14 DIAGNOSIS — Z79899 Other long term (current) drug therapy: Secondary | ICD-10-CM | POA: Diagnosis not present

## 2015-11-14 DIAGNOSIS — R531 Weakness: Secondary | ICD-10-CM

## 2015-11-14 DIAGNOSIS — Z7984 Long term (current) use of oral hypoglycemic drugs: Secondary | ICD-10-CM | POA: Insufficient documentation

## 2015-11-14 DIAGNOSIS — R710 Precipitous drop in hematocrit: Secondary | ICD-10-CM | POA: Diagnosis not present

## 2015-11-14 DIAGNOSIS — R918 Other nonspecific abnormal finding of lung field: Secondary | ICD-10-CM | POA: Diagnosis not present

## 2015-11-14 DIAGNOSIS — C799 Secondary malignant neoplasm of unspecified site: Secondary | ICD-10-CM

## 2015-11-14 DIAGNOSIS — D649 Anemia, unspecified: Principal | ICD-10-CM | POA: Insufficient documentation

## 2015-11-14 DIAGNOSIS — R591 Generalized enlarged lymph nodes: Secondary | ICD-10-CM | POA: Diagnosis present

## 2015-11-14 DIAGNOSIS — Z87891 Personal history of nicotine dependence: Secondary | ICD-10-CM | POA: Insufficient documentation

## 2015-11-14 DIAGNOSIS — E871 Hypo-osmolality and hyponatremia: Secondary | ICD-10-CM | POA: Diagnosis present

## 2015-11-14 DIAGNOSIS — E119 Type 2 diabetes mellitus without complications: Secondary | ICD-10-CM | POA: Diagnosis present

## 2015-11-14 DIAGNOSIS — J948 Other specified pleural conditions: Secondary | ICD-10-CM | POA: Diagnosis present

## 2015-11-14 LAB — GLUCOSE, CAPILLARY
Glucose-Capillary: 158 mg/dL — ABNORMAL HIGH (ref 65–99)
Glucose-Capillary: 216 mg/dL — ABNORMAL HIGH (ref 65–99)

## 2015-11-14 LAB — CBC WITH DIFFERENTIAL/PLATELET
BASOS ABS: 0 10*3/uL (ref 0.0–0.1)
BASOS PCT: 0 %
EOS PCT: 1 %
Eosinophils Absolute: 0.1 10*3/uL (ref 0.0–0.7)
HEMATOCRIT: 21.7 % — AB (ref 39.0–52.0)
HEMOGLOBIN: 6.7 g/dL — AB (ref 13.0–17.0)
LYMPHS PCT: 25 %
Lymphs Abs: 1.9 10*3/uL (ref 0.7–4.0)
MCH: 24.7 pg — ABNORMAL LOW (ref 26.0–34.0)
MCHC: 30.9 g/dL (ref 30.0–36.0)
MCV: 80.1 fL (ref 78.0–100.0)
MONOS PCT: 21 %
Monocytes Absolute: 1.6 10*3/uL — ABNORMAL HIGH (ref 0.1–1.0)
NEUTROS ABS: 3.9 10*3/uL (ref 1.7–7.7)
Neutrophils Relative %: 53 %
Platelets: 229 10*3/uL (ref 150–400)
RBC: 2.71 MIL/uL — ABNORMAL LOW (ref 4.22–5.81)
RDW: 18 % — ABNORMAL HIGH (ref 11.5–15.5)
WBC: 7.5 10*3/uL (ref 4.0–10.5)

## 2015-11-14 LAB — COMPREHENSIVE METABOLIC PANEL
ALBUMIN: 2.5 g/dL — AB (ref 3.5–5.0)
ALT: 31 U/L (ref 17–63)
ANION GAP: 10 (ref 5–15)
AST: 16 U/L (ref 15–41)
Alkaline Phosphatase: 140 U/L — ABNORMAL HIGH (ref 38–126)
BUN: 17 mg/dL (ref 6–20)
CHLORIDE: 103 mmol/L (ref 101–111)
CO2: 20 mmol/L — AB (ref 22–32)
Calcium: 8.7 mg/dL — ABNORMAL LOW (ref 8.9–10.3)
Creatinine, Ser: 1.14 mg/dL (ref 0.61–1.24)
GFR calc Af Amer: >60 mL/min (ref >60)
GFR calc non Af Amer: >60 mL/min (ref >60)
GLUCOSE: 221 mg/dL — AB (ref 65–99)
POTASSIUM: 4.2 mmol/L (ref 3.5–5.1)
SODIUM: 133 mmol/L — AB (ref 135–145)
TOTAL PROTEIN: 7.4 g/dL (ref 6.5–8.1)
Total Bilirubin: 0.4 mg/dL (ref 0.3–1.2)

## 2015-11-14 LAB — PHOSPHORUS: PHOSPHORUS: 3.2 mg/dL (ref 2.5–4.6)

## 2015-11-14 LAB — MAGNESIUM: MAGNESIUM: 1.7 mg/dL (ref 1.7–2.4)

## 2015-11-14 LAB — POCT HEMOGLOBIN: Hemoglobin: 6.2 g/dL — AB (ref 14.1–18.1)

## 2015-11-14 LAB — PREPARE RBC (CROSSMATCH)

## 2015-11-14 MED ORDER — CARVEDILOL 3.125 MG PO TABS
3.1250 mg | ORAL_TABLET | Freq: Two times a day (BID) | ORAL | Status: DC
  Administered 2015-11-14 – 2015-11-15 (×2): 3.125 mg via ORAL
  Filled 2015-11-14 (×2): qty 1

## 2015-11-14 MED ORDER — ENSURE ENLIVE PO LIQD
237.0000 mL | Freq: Two times a day (BID) | ORAL | Status: DC
  Administered 2015-11-14 – 2015-11-15 (×2): 237 mL via ORAL

## 2015-11-14 MED ORDER — ACETAMINOPHEN 325 MG PO TABS
650.0000 mg | ORAL_TABLET | Freq: Four times a day (QID) | ORAL | Status: DC | PRN
  Administered 2015-11-14: 650 mg via ORAL
  Filled 2015-11-14: qty 2

## 2015-11-14 MED ORDER — CLONAZEPAM 0.5 MG PO TABS
0.5000 mg | ORAL_TABLET | Freq: Two times a day (BID) | ORAL | Status: DC | PRN
  Administered 2015-11-14: 0.5 mg via ORAL
  Filled 2015-11-14: qty 1

## 2015-11-14 MED ORDER — INSULIN ASPART 100 UNIT/ML ~~LOC~~ SOLN
0.0000 [IU] | Freq: Three times a day (TID) | SUBCUTANEOUS | Status: DC
  Administered 2015-11-14: 2 [IU] via SUBCUTANEOUS
  Administered 2015-11-15: 3 [IU] via SUBCUTANEOUS
  Administered 2015-11-15: 2 [IU] via SUBCUTANEOUS

## 2015-11-14 MED ORDER — ONDANSETRON HCL 4 MG PO TABS: 4.0000 mg | ORAL_TABLET | Freq: Four times a day (QID) | ORAL | Status: DC | PRN

## 2015-11-14 MED ORDER — SODIUM CHLORIDE 0.9 % IV SOLN
INTRAVENOUS | Status: AC
  Administered 2015-11-14: 17:00:00 via INTRAVENOUS

## 2015-11-14 MED ORDER — VITAMIN B-12 1000 MCG PO TABS
1000.0000 ug | ORAL_TABLET | Freq: Every day | ORAL | Status: DC
  Administered 2015-11-14 – 2015-11-15 (×2): 1000 ug via ORAL
  Filled 2015-11-14 (×2): qty 1

## 2015-11-14 MED ORDER — PROCHLORPERAZINE MALEATE 10 MG PO TABS: 10.0000 mg | ORAL_TABLET | Freq: Four times a day (QID) | ORAL | Status: DC | PRN

## 2015-11-14 MED ORDER — SENNOSIDES-DOCUSATE SODIUM 8.6-50 MG PO TABS: 1.0000 | ORAL_TABLET | Freq: Every evening | ORAL | Status: DC | PRN

## 2015-11-14 MED ORDER — SODIUM CHLORIDE 0.9 % IV SOLN: 10.0000 mL/h | Freq: Once | INTRAVENOUS | Status: DC

## 2015-11-14 MED ORDER — ZOLPIDEM TARTRATE 5 MG PO TABS: 5.0000 mg | ORAL_TABLET | Freq: Every evening | ORAL | Status: DC | PRN

## 2015-11-14 MED ORDER — CHLORPROMAZINE HCL 50 MG PO TABS
50.0000 mg | ORAL_TABLET | Freq: Three times a day (TID) | ORAL | Status: DC | PRN
  Administered 2015-11-14 – 2015-11-15 (×2): 50 mg via ORAL
  Filled 2015-11-14 (×3): qty 1

## 2015-11-14 MED ORDER — ACETAMINOPHEN 650 MG RE SUPP: 650.0000 mg | Freq: Four times a day (QID) | RECTAL | Status: DC | PRN

## 2015-11-14 MED ORDER — SODIUM CHLORIDE 0.9 % IV BOLUS (SEPSIS)
1000.0000 mL | Freq: Once | INTRAVENOUS | Status: AC
  Administered 2015-11-14: 1000 mL via INTRAVENOUS

## 2015-11-14 MED ORDER — FAMOTIDINE 20 MG PO TABS
20.0000 mg | ORAL_TABLET | Freq: Once | ORAL | Status: AC
  Administered 2015-11-14: 20 mg via ORAL
  Filled 2015-11-14: qty 1

## 2015-11-14 MED ORDER — MEGESTROL ACETATE 400 MG/10ML PO SUSP
400.0000 mg | Freq: Two times a day (BID) | ORAL | Status: DC
  Administered 2015-11-14 – 2015-11-15 (×2): 400 mg via ORAL
  Filled 2015-11-14 (×2): qty 10

## 2015-11-14 MED ORDER — PANTOPRAZOLE SODIUM 40 MG PO TBEC
40.0000 mg | DELAYED_RELEASE_TABLET | Freq: Every day | ORAL | Status: DC
  Administered 2015-11-14 – 2015-11-15 (×2): 40 mg via ORAL
  Filled 2015-11-14 (×2): qty 1

## 2015-11-14 MED ORDER — FERROUS SULFATE 325 (65 FE) MG PO TABS
325.0000 mg | ORAL_TABLET | Freq: Two times a day (BID) | ORAL | Status: DC
Start: 2015-11-14 — End: 2015-11-15
  Administered 2015-11-14 – 2015-11-15 (×2): 325 mg via ORAL
  Filled 2015-11-14 (×2): qty 1

## 2015-11-14 MED ORDER — ENOXAPARIN SODIUM 40 MG/0.4ML ~~LOC~~ SOLN
40.0000 mg | SUBCUTANEOUS | Status: DC
  Administered 2015-11-14: 40 mg via SUBCUTANEOUS
  Filled 2015-11-14: qty 0.4

## 2015-11-14 MED ORDER — ONDANSETRON HCL 4 MG/2ML IJ SOLN: 4.0000 mg | Freq: Four times a day (QID) | INTRAMUSCULAR | Status: DC | PRN

## 2015-11-14 MED ORDER — HYDROCODONE-ACETAMINOPHEN 5-325 MG PO TABS: 1.0000 | ORAL_TABLET | Freq: Four times a day (QID) | ORAL | Status: DC | PRN

## 2015-11-14 NOTE — ED Triage Notes (Signed)
He was seen by his pcp, who performed lab work and found him to be anemic and in need of transfusion. Pt. Has no complaints.

## 2015-11-14 NOTE — ED Notes (Signed)
I have just given report to Maudie Mercury, Therapist, sports and will transport now.

## 2015-11-14 NOTE — ED Provider Notes (Signed)
[facility] DEPT Provider Note   CSN: [id number] Arrival date & time: 11/14/15  1155     History   Chief Complaint Chief Complaint  Patient presents with  . Anemia    HPI David [name] is a 55 y.o. male.  Patient is 55 yo M with PMH of renal carcinoma with pulmonary mets, presenting to ED with chief complaint of anemia and weakness after seeing PCP just PTA. He states he has been weak since starting his Gemzar chemo therapy (received 2 doses since cancer diagnosis, last dose was Tuesday 9/19). Denies any fever, chills, shortness of breath, dyspnea on exertion, cough, chest pain, palpitations, abdominal pain, blood in stool, dizziness, or syncope. No change in activity or appetite. Seen in ED for generalized weakness on 9/6 and admitted for symptomatic anemia, receiving blood transfusion 9/11. Patient closely followed by oncology (Dr. Alen Blew).       Past Medical History:  Diagnosis Date  . Cancer ([place])   . Dental caries   . Diabetes mellitus, type II ([place]) 01/2012  . Hyperlipidemia   . Influenza vaccine refused 01/18/2013  . Kidney cancer, primary, with metastasis from kidney to other site [facility])   . Obesity   . Pneumococcal vaccine refused 01/18/2013  . Scoliosis   . Tobacco use   . Vision impairment    left, due to prior trauma    Patient Active Problem List   Diagnosis Date Noted  . SIRS (systemic inflammatory response syndrome) (HCC)   . S/P thoracentesis   . Fever, unspecified 10/25/2015  . Pleural effusion on right 10/25/2015  . Pleural mass 10/25/2015  . Symptomatic anemia 10/24/2015  . Primary cancer of renal pelvis ([place]) 10/18/2015  . Abnormal CT of the chest 10/03/2015  . Screening for prostate cancer 10/03/2015  . Creatinine elevation 10/03/2015  . Pulmonary nodules 10/03/2015  . Loss of weight 10/03/2015  . Lymphadenopathy 10/03/2015  . History of colonic polyps 10/03/2015  . Leukocytosis 10/03/2015  . Anemia, unspecified 10/03/2015  . Former  smoker 10/03/2015  . Type 2 diabetes mellitus with complication, without long-term current use of insulin ([place]) 01/18/2013  . Obesity, unspecified 01/18/2013    Past Surgical History:  Procedure Laterality Date  . COLONOSCOPY W/ POLYPECTOMY    . EYE SURGERY  1996   due to trauma, left eye       Home Medications    Prior to Admission medications   Medication Sig Start Date End Date Taking? Authorizing Provider  carvedilol (COREG) 3.125 MG tablet Take 1 tablet (3.125 mg total) by mouth 2 (two) times daily with a meal. 10/28/15   Debbe Odea, MD  chlorproMAZINE (THORAZINE) 50 MG tablet Take 1 tablet (50 mg total) by mouth 3 (three) times daily as needed for hiccoughs. 10/28/15   Debbe Odea, MD  clonazePAM (KLONOPIN) 0.5 MG tablet 1 tablet po 1-2 times daily prn anxiety, caution due to sedation 10/04/15   Camelia Eng Tysinger, PA-C  ferrous sulfate 325 (65 FE) MG tablet Take 1 tablet (325 mg total) by mouth 2 (two) times daily with a meal. 10/28/15   Debbe Odea, MD  HYDROcodone-acetaminophen (NORCO/VICODIN) 5-325 MG tablet Take 1 tablet by mouth every 6 (six) hours as needed for moderate pain. 11/03/15   Antonietta Breach, PA-C  megestrol (MEGACE) 400 MG/10ML suspension Take 10 mLs (400 mg total) by mouth 2 (two) times daily. 11/06/15   Wyatt Portela, MD  metFORMIN (GLUCOPHAGE) 500 MG tablet Take 1 tablet (500 mg total) by mouth daily with  breakfast. 10/28/15   Debbe Odea, MD  prochlorperazine (COMPAZINE) 10 MG tablet Take 1 tablet (10 mg total) by mouth every 6 (six) hours as needed for nausea or vomiting. Patient not taking: Reported on 11/14/2015 10/29/15   Maryanna Shape, NP  vitamin B-12 (CYANOCOBALAMIN) 1000 MCG tablet Take 1 tablet (1,000 mcg total) by mouth daily. 10/28/15   Debbe Odea, MD    Family History Family History  Problem Relation Age of Onset  . Diabetes Sister   . Other Sister     gastric bypass  . Hyperlipidemia Mother   . Diabetes Mother   . Cancer Father 41     prostate cancer  . Stroke Maternal Grandmother   . Heart disease Neg Hx   . Hypertension Neg Hx   . Colon cancer Neg Hx     Social History Social History  Substance Use Topics  . Smoking status: Former Smoker    Packs/day: 0.25    Years: 20.00    Types: Cigarettes  . Smokeless tobacco: Never Used  . Alcohol use No     Allergies   Review of patient's allergies indicates no known allergies.   Review of Systems Review of Systems  Constitutional: Positive for fatigue. Negative for activity change, appetite change, chills and fever.  HENT: Negative for ear pain and sore throat.   Eyes: Negative for pain and visual disturbance.  Respiratory: Negative for cough and shortness of breath.   Cardiovascular: Negative for chest pain and palpitations.  Gastrointestinal: Negative for abdominal pain, blood in stool and vomiting.  Genitourinary: Negative for dysuria and hematuria.  Musculoskeletal: Negative for back pain and neck pain.  Skin: Negative for color change and rash.  Neurological: Positive for weakness. Negative for dizziness, seizures, syncope and headaches.  All other systems reviewed and are negative.    Physical Exam Updated Vital Signs BP 142/86 (BP Location: Right Arm)   Pulse 101   Temp 97.7 F (36.5 C) (Oral)   Resp 18   SpO2 100%   Physical Exam  Constitutional:  Pleasant, WDWN male, resting comfortably, no acute distress  HENT:  Head: Normocephalic and atraumatic.  Mouth/Throat: Oropharynx is clear and moist.  Eyes: Conjunctivae are normal.  Neck: Neck supple.  Cardiovascular: Normal heart sounds and intact distal pulses.  Tachycardia present.   Pulmonary/Chest: Effort normal and breath sounds normal. No respiratory distress.  Abdominal: Soft. There is no tenderness.  Musculoskeletal: Normal range of motion. He exhibits no edema or tenderness.  Neurological: He is alert. He has normal strength. No sensory deficit.  Skin: Skin is warm and dry.  No  pallor appreciated on exam  Psychiatric: He has a normal mood and affect.  Nursing note and vitals reviewed.    ED Treatments / Results  Labs (all labs ordered are listed, but only abnormal results are displayed) Labs Reviewed  COMPREHENSIVE METABOLIC PANEL  CBC WITH DIFFERENTIAL/PLATELET  TYPE AND SCREEN    EKG  EKG Interpretation None       Radiology No results found.  Procedures Procedures (including critical care time)  Medications Ordered in ED Medications  sodium chloride 0.9 % bolus 1,000 mL (not administered)     Initial Impression / Assessment and Plan / ED Course  I have reviewed the triage vital signs and the nursing notes.  Pertinent labs & imaging results that were available during my care of the patient were reviewed by me and considered in my medical decision making (see chart for details).  Clinical  Course   Patient is 55 yo M with hx of renal carcinoma, presenting to ED after PCP visit today in which his Hgb was checked and found to be 6.2. Admitted on 9/6 for symptomatic anemia and transfused 9/11. Physical exam unremarkable, and only complaint is general weakness. Basic labs ordered including CBC, CMP, and type and screen, as well as IVF and EKG.  Hgb 6.7 and orders placed for transfusion of 2 units of PRBC. Consult to hospitalist placed by attending physician, Dr. Vanita Panda. Patient notified that he will be admitted for management of symptomatic anemia, and is agreeable to plan.  Final Clinical Impressions(s) / ED Diagnoses   Final diagnoses:  Symptomatic anemia    New Prescriptions New Prescriptions   No medications on file     Rosilyn Mings II, [place] 11/14/15 1501    Carmin Muskrat, MD 11/14/15 1752

## 2015-11-14 NOTE — Progress Notes (Signed)
Subjective: Chief Complaint  Patient presents with  . Hospitalization Follow-up   Here for hospital f/u.  Accompanied by wife.  His history is significant for metastatic carcinoma arising from left kidney and renal pelvis, pulmonary metastasis, pulmonary nodules, pleural effusion, diabetes.     Was hospitalized 10/23/15-10/28/15 for weakness, anemia, pleural effusions.   He was seen in ED on 11/02/15 for abdominal discomfort, bloating.    Went back to hospital 11/02/15 for weakness, had eval, was advised to f/u here and with oncology  His main concern is weakness and rapid weight loss.  He just saw Dr. Alen Blew on 10/30/15.  At that time he was 197lb.  He is eating 3-4 small meals daily, has some occasional nausea, occasional hiccups, has medication for this, but it hasn't been that bad last few days.  He just feels no energy, weak.  Is taking Megace 400mg  BID.  Is doing 1 ensure daily.   He had blood transfusion within past 2 weeks.   No recent fevers in the last several days.   He has some appetite.  drinking water, Gatorade throughout the day.  Denies polydipsia.   No other current reported symptoms.  ROS as in subjective   Objective: BP 114/76 (BP Location: Left Arm, Patient Position: Sitting, Cuff Size: Normal)   Pulse 100   Temp 98.9 F (37.2 C) (Oral)   Resp 12   Ht 5\' 9"  (1.753 m)   Wt 176 lb 12.8 oz (80.2 kg)   SpO2 99%   BMI 26.11 kg/m   Gen: weak appearing with pallor, nad He seems a little tachypnea, mildly tachycardic at 100bpm Lungs clear Heart tachycardic, but normal s1, s2, no murmurs Ext: no edema Pulses WNL Abdomen: nontender, non distended Oral MMM No skin tenting Is off balance some with gait, seems weak just getting out of chair   Assessment: Encounter Diagnoses  Name Primary?  . Generalized weakness Yes  . Loss of weight   . Drop in hemoglobin   . Primary cancer of renal pelvis ([place])   . Pulmonary nodules   . Leukocytosis   . Metastasis from malignant  tumor of kidney, unspecified laterality ([place])      Plan Hemoglobin in the office today fingerstick 6.2, down from 7.2, just 8 days ago.   He has lost 21lb in the last 2 weeks He has pallor, is off balance with gait, is weak Reviewed his recent ED visit and hospitalization notes.   Called and discussed case with Dr. Alen Blew. He recommend patient been seen at the ED in observation, transfusion with 2 units red blood cells, IV fluids, and likely admitted.  Patient's wife will take him to [facility] ED.

## 2015-11-14 NOTE — H&P (Signed)
History and Physical  David [name] [id number] DOB: 1961/01/25 DOA: 11/14/2015  Referring physician: Vanita Panda, MD PCP: Crisoforo Oxford, PA-C  Outpatient Specialists: Dr. Alen Blew - oncology  Chief Complaint: weakness  HPI: David [name] is a 55 y.o. male with PMH of renal carcinoma with pulmonary mets, presenting to ED from PCP office with chief complaint of anemia and weakness and hemoglobin noted to be 6.2.  He states he has been weak since starting his Gemzar chemotherapy (received 2 doses since cancer diagnosis, last dose was Tuesday 9/19). Denies any fever, chills, shortness of breath, dyspnea on exertion, cough, chest pain, palpitations, abdominal pain, blood in stool, dizziness, or syncope.  Denies melena.  No change in activity or appetite but has not been eating well and not drinking well.  Seen in ED for generalized weakness on 9/6 and admitted for symptomatic anemia, receiving blood transfusion 9/11. Patient closely followed by oncology (Dr. Alen Blew).   In the ED was noted to have Hg of 6.7 and 2 units PRBC ordered.    Review of Systems: All systems reviewed and apart from history of presenting illness, are negative.  Past Medical History:  Diagnosis Date  . Cancer ([place])   . Dental caries   . Diabetes mellitus, type II ([place]) 01/2012  . Hyperlipidemia   . Influenza vaccine refused 01/18/2013  . Kidney cancer, primary, with metastasis from kidney to other site [facility])   . Obesity   . Pneumococcal vaccine refused 01/18/2013  . Scoliosis   . Tobacco use   . Vision impairment    left, due to prior trauma   Past Surgical History:  Procedure Laterality Date  . COLONOSCOPY W/ POLYPECTOMY    . EYE SURGERY  1996   due to trauma, left eye   Social History:  reports that he has quit smoking. His smoking use included Cigarettes. He has a 5.00 pack-year smoking history. He has never used smokeless tobacco. He reports that he does not drink alcohol or use drugs.   No Known  Allergies  Family History  Problem Relation Age of Onset  . Diabetes Sister   . Other Sister     gastric bypass  . Hyperlipidemia Mother   . Diabetes Mother   . Cancer Father 27    prostate cancer  . Stroke Maternal Grandmother   . Heart disease Neg Hx   . Hypertension Neg Hx   . Colon cancer Neg Hx    Prior to Admission medications   Medication Sig Start Date End Date Taking? Authorizing Provider  carvedilol (COREG) 3.125 MG tablet Take 1 tablet (3.125 mg total) by mouth 2 (two) times daily with a meal. 10/28/15  Yes Debbe Odea, MD  chlorproMAZINE (THORAZINE) 50 MG tablet Take 1 tablet (50 mg total) by mouth 3 (three) times daily as needed for hiccoughs. 10/28/15  Yes Debbe Odea, MD  clonazePAM (KLONOPIN) 0.5 MG tablet 1 tablet po 1-2 times daily prn anxiety, caution due to sedation Patient taking differently: Take 0.5 mg by mouth 2 (two) times daily as needed for anxiety.  10/04/15  Yes Camelia Eng Tysinger, PA-C  ferrous sulfate 325 (65 FE) MG tablet Take 1 tablet (325 mg total) by mouth 2 (two) times daily with a meal. 10/28/15  Yes Debbe Odea, MD  HYDROcodone-acetaminophen (NORCO/VICODIN) 5-325 MG tablet Take 1 tablet by mouth every 6 (six) hours as needed for moderate pain. 11/03/15  Yes Antonietta Breach, PA-C  metFORMIN (GLUCOPHAGE) 500 MG tablet Take 1 tablet (500 mg  total) by mouth daily with breakfast. 10/28/15  Yes Debbe Odea, MD  vitamin B-12 (CYANOCOBALAMIN) 1000 MCG tablet Take 1 tablet (1,000 mcg total) by mouth daily. Patient taking differently: Take 1,000 mcg by mouth every evening.  10/28/15  Yes Debbe Odea, MD  megestrol (MEGACE) 400 MG/10ML suspension Take 10 mLs (400 mg total) by mouth 2 (two) times daily. 11/06/15   Wyatt Portela, MD  prochlorperazine (COMPAZINE) 10 MG tablet Take 1 tablet (10 mg total) by mouth every 6 (six) hours as needed for nausea or vomiting. Patient not taking: Reported on 11/14/2015 10/29/15   Maryanna Shape, NP   Physical Exam: Vitals:    11/14/15 1203  BP: 142/86  Pulse: 101  Resp: 18  Temp: 97.7 F (36.5 C)  TempSrc: Oral  SpO2: 100%     General exam: Moderately built and nourished patient, lying comfortably supine on the gurney in no obvious distress.  Head, eyes and ENT: Nontraumatic and normocephalic. Pupils equally reacting to light and accommodation. Oral mucosa moist.  Neck: Supple. No JVD, carotid bruit or thyromegaly.  Lymphatics: No lymphadenopathy.  Respiratory system: Clear to auscultation. No increased work of breathing.  Cardiovascular system: S1 and S2 heard, tachycardic rate. No JVD, murmurs, gallops, clicks or pedal edema.  Gastrointestinal system: Abdomen is nondistended, soft and nontender. Normal bowel sounds heard. No organomegaly or masses appreciated.  Central nervous system: Alert and oriented. No focal neurological deficits.  Extremities: Symmetric 5 x 5 power. Peripheral pulses symmetrically felt.   Skin: No rashes or acute findings.  Musculoskeletal system: Negative exam.  Psychiatry: Pleasant and cooperative.  Labs on Admission:  Basic Metabolic Panel:  Recent Labs Lab 11/14/15 1255  NA 133*  K 4.2  CL 103  CO2 20*  GLUCOSE 221*  BUN 17  CREATININE 1.14  CALCIUM 8.7*   Liver Function Tests:  Recent Labs Lab 11/14/15 1255  AST 16  ALT 31  ALKPHOS 140*  BILITOT 0.4  PROT 7.4  ALBUMIN 2.5*   No results for input(s): LIPASE, AMYLASE in the last 168 hours. No results for input(s): AMMONIA in the last 168 hours. CBC:  Recent Labs Lab 11/14/15 1217 11/14/15 1255  WBC  --  7.5  NEUTROABS  --  3.9  HGB 6.2* 6.7*  HCT  --  21.7*  MCV  --  80.1  PLT  --  229   Cardiac Enzymes: No results for input(s): CKTOTAL, CKMB, CKMBINDEX, TROPONINI in the last 168 hours.  BNP (last 3 results) No results for input(s): PROBNP in the last 8760 hours. CBG: No results for input(s): GLUCAP in the last 168 hours.  Radiological Exams on Admission: No results  found.  EKG: Independently reviewed.  Assessment/Plan Principal Problem:   Symptomatic anemia Active Problems:   Type 2 diabetes mellitus with complication, without long-term current use of insulin (HCC)   Loss of weight   Lymphadenopathy   Anemia, unspecified   Former smoker   Primary cancer of renal pelvis (HCC)   Pleural mass   Hyponatremia   Hypoalbuminemia due to protein-calorie malnutrition (HCC)   Generalized weakness  1. Symptomatic anemia - in a patient currently on Gemzar chemotherapy for renal cancer - Admit for observation, transfuse 2 units packed red blood cells.  Ordered to repeat CBC in the morning and give additional PRBC if needed.   2. Type 2 diabetes mellitus-carb modified diet recommended. Sliding scale coverage ordered. Monitor blood glucose with Accu-Cheks. 3. Abnormal weight loss-secondary to cancer and chemotherapy  treatment-consult dietitian for recommendations. 4. Hyponatremia-likely multifactorial but he is mildly clinically dehydrated and will give gentle IV fluids for several hours and repeat BMP in the morning. 5. Generalized weakness-multifactorial, PT/evaluation ordered for tomorrow. 6. Primary cancer of renal pelvis with metastasis - Will notify Dr. Alen Blew of admission.     DVT Prophylaxis: lovenox Code Status: FULL  Family Communication: bedside (wife)  Disposition Plan: home when medically stabilized   Time spent: 50 mins  Irwin Brakeman, MD [facility] Pager [phone]  If 7PM-7AM, please contact night-coverage [email or web address] Password [email or web address] 11/14/2015, 2:56 PM

## 2015-11-14 NOTE — Addendum Note (Signed)
Addended by: Milta Deiters on: 11/14/2015 12:18 PM   Modules accepted: Orders

## 2015-11-15 ENCOUNTER — Telehealth: Payer: Self-pay

## 2015-11-15 DIAGNOSIS — E43 Unspecified severe protein-calorie malnutrition: Secondary | ICD-10-CM | POA: Insufficient documentation

## 2015-11-15 DIAGNOSIS — D649 Anemia, unspecified: Secondary | ICD-10-CM | POA: Diagnosis not present

## 2015-11-15 LAB — CBC
HCT: 25.1 % — ABNORMAL LOW (ref 39.0–52.0)
Hemoglobin: 8.1 g/dL — ABNORMAL LOW (ref 13.0–17.0)
MCH: 25.6 pg — ABNORMAL LOW (ref 26.0–34.0)
MCHC: 32.3 g/dL (ref 30.0–36.0)
MCV: 79.2 fL (ref 78.0–100.0)
PLATELETS: 301 10*3/uL (ref 150–400)
RBC: 3.17 MIL/uL — ABNORMAL LOW (ref 4.22–5.81)
RDW: 16.8 % — AB (ref 11.5–15.5)
WBC: 7.9 10*3/uL (ref 4.0–10.5)

## 2015-11-15 LAB — COMPREHENSIVE METABOLIC PANEL
ALT: 27 U/L (ref 17–63)
AST: 14 U/L — AB (ref 15–41)
Albumin: 2.2 g/dL — ABNORMAL LOW (ref 3.5–5.0)
Alkaline Phosphatase: 124 U/L (ref 38–126)
Anion gap: 9 (ref 5–15)
BILIRUBIN TOTAL: 0.6 mg/dL (ref 0.3–1.2)
BUN: 16 mg/dL (ref 6–20)
CHLORIDE: 105 mmol/L (ref 101–111)
CO2: 21 mmol/L — ABNORMAL LOW (ref 22–32)
CREATININE: 1.08 mg/dL (ref 0.61–1.24)
Calcium: 8.6 mg/dL — ABNORMAL LOW (ref 8.9–10.3)
GFR calc Af Amer: >60 mL/min (ref >60)
Glucose, Bld: 247 mg/dL — ABNORMAL HIGH (ref 65–99)
Potassium: 4.2 mmol/L (ref 3.5–5.1)
Sodium: 135 mmol/L (ref 135–145)
TOTAL PROTEIN: 6.7 g/dL (ref 6.5–8.1)

## 2015-11-15 LAB — TSH: TSH: 1.483 u[IU]/mL (ref 0.350–4.500)

## 2015-11-15 LAB — PREPARE RBC (CROSSMATCH)

## 2015-11-15 LAB — GLUCOSE, CAPILLARY
Glucose-Capillary: 162 mg/dL — ABNORMAL HIGH (ref 65–99)
Glucose-Capillary: 234 mg/dL — ABNORMAL HIGH (ref 65–99)

## 2015-11-15 MED ORDER — PANTOPRAZOLE SODIUM 40 MG PO TBEC: 40.0000 mg | DELAYED_RELEASE_TABLET | Freq: Every day | ORAL | 3 refills | Status: DC

## 2015-11-15 MED ORDER — CYANOCOBALAMIN 1000 MCG/ML IJ SOLN
1000.0000 ug | Freq: Once | INTRAMUSCULAR | Status: AC
  Administered 2015-11-15: 1000 ug via INTRAMUSCULAR
  Filled 2015-11-15: qty 1

## 2015-11-15 MED ORDER — ENSURE ENLIVE PO LIQD: 237.0000 mL | Freq: Two times a day (BID) | ORAL | 12 refills | Status: DC

## 2015-11-15 MED ORDER — SODIUM CHLORIDE 0.9 % IV SOLN
Freq: Once | INTRAVENOUS | Status: AC
  Administered 2015-11-15: 12:00:00 via INTRAVENOUS

## 2015-11-15 MED ORDER — CYANOCOBALAMIN 1000 MCG/ML IJ SOLN: INTRAMUSCULAR | 12 refills | Status: DC

## 2015-11-15 NOTE — Progress Notes (Signed)
Initial Nutrition Assessment  DOCUMENTATION CODES:   Severe malnutrition in context of chronic illness  INTERVENTION:   -Continue Ensure Enlive po TID, each supplement provides 350 kcal and 20 grams of protein -Provided brief education on small, frequent meals -Provided "Protein food list", "Nutrition Therapy for during and after Cancer Treatment" from [employer]. Also, provided handout on tips for taste changes. -RD to continue to monitor  NUTRITION DIAGNOSIS:   Increased nutrient needs related to cancer and cancer related treatments as evidenced by estimated needs.  GOAL:   Patient will meet greater than or equal to 90% of their needs  MONITOR:   PO intake, Supplement acceptance, Labs, Weight trends, I & O's  REASON FOR ASSESSMENT:   Consult Assessment of nutrition requirement/status  ASSESSMENT:   55 y.o. male with PMH of renal carcinoma with pulmonary mets, presenting to ED from PCP office with chief complaint of anemia and weakness and hemoglobin noted to be 6.2.  He states he has been weak since starting his Gemzar chemotherapy (received 2 doses since cancer diagnosis, last dose was Tuesday 9/19).  Patient in room with family at bedside. Pt reports "terrible" appetite with bland tastes. Pt denies swallowing or chewing issues. Pt's son states that pt usually consumes 4-5 small meals a day such as a boiled egg, peanut butter and jelly sandwich, ensure supplements, etc. Pt feels a little disappointed that he has been eating this often and is still seeing weight loss. PO intake documentation shows 100% meal completion x multiple meals. Pt states he would eat more if he wasn't on a heart healthy diet where he can't have salt.  Encouraged patient to increase consumption of Ensure supplements at home and to increase small meals to 5-6 a day.   Per family request, provided handouts on high protein foods, taste change strategies and nutrition before and after  cancer treatments.  Per weight history, pt has lost 27 lb since 9/8 (13% wt loss x 20 days, significant for time frame). Nutrition-Focused physical exam completed. Findings are mild-moderate fat depletion, mild-moderate muscle depletion, and no edema.    Medications: Ferrous Sulfate tablet BID, Megace suspension BID, Vitamin B-12 tablet daily Labs reviewed: CBGs: 162-234 Mg/Phos WNL  Diet Order:  Diet heart healthy/carb modified Room service appropriate? Yes; Fluid consistency: Thin  Skin:  Reviewed, no issues  Last BM:  9/25  Height:   Ht Readings from Last 1 Encounters:  11/14/15 5\' 9"  (1.753 m)    Weight:   Wt Readings from Last 1 Encounters:  11/15/15 180 lb 8.9 oz (81.9 kg)    Ideal Body Weight:     BMI:  Body mass index is 26.66 kg/m.  Estimated Nutritional Needs:   Kcal:  2400-2600  Protein:  120-130g  Fluid:  2.5L/day  EDUCATION NEEDS:   Education needs addressed (Provided handouts: Protein foods list, Nutritiion Therapy during and after treatment, and Taste change strategies)  David Bibles, MS, RD, LDN Pager: [phone] After Hours Pager: [phone]

## 2015-11-15 NOTE — Discharge Instructions (Signed)
Follow up with Dr Alen Blew at the [facility] for your Vit B injections beginning 11/20/2015.  Weekly x 4 then Monthly per our discussion.    Follow with Primary MD Crisoforo Oxford, PA-C in 7 days   Get CBC, CMP,checked  by Primary MD next visit.    Activity: As tolerated with Full fall precautions use walker/cane & assistance as needed   Disposition Home    Diet: Carb modified  , with feeding assistance and aspiration precautions.  For Heart failure patients - Check your Weight same time everyday, if you gain over 2 pounds, or you develop in leg swelling, experience more shortness of breath or chest pain, call your Primary MD immediately. Follow Cardiac Low Salt Diet and 1.5 lit/day fluid restriction.   On your next visit with your primary care physician please Get Medicines reviewed and adjusted.   Please request your Prim.MD to go over all Hospital Tests and Procedure/Radiological results at the follow up, please get all Hospital records sent to your Prim MD by signing hospital release before you go home.   If you experience worsening of your admission symptoms, develop shortness of breath, life threatening emergency, suicidal or homicidal thoughts you must seek medical attention immediately by calling 911 or calling your MD immediately  if symptoms less severe.  You Must read complete instructions/literature along with all the possible adverse reactions/side effects for all the Medicines you take and that have been prescribed to you. Take any new Medicines after you have completely understood and accpet all the possible adverse reactions/side effects.   Do not drive, operating heavy machinery, perform activities at heights, swimming or participation in water activities or provide baby sitting services if your were admitted for syncope or siezures until you have seen by Primary MD or a Neurologist and advised to do so again.  Do not drive when taking Pain medications.    Do  not take more than prescribed Pain, Sleep and Anxiety Medications  Special Instructions: If you have smoked or chewed Tobacco  in the last 2 yrs please stop smoking, stop any regular Alcohol  and or any Recreational drug use.  Wear Seat belts while driving.   Please note  You were cared for by a hospitalist during your hospital stay. If you have any questions about your discharge medications or the care you received while you were in the hospital after you are discharged, you can call the unit and asked to speak with the hospitalist on call if the hospitalist that took care of you is not available. Once you are discharged, your primary care physician will handle any further medical issues. Please note that NO REFILLS for any discharge medications will be authorized once you are discharged, as it is imperative that you return to your primary care physician (or establish a relationship with a primary care physician if you do not have one) for your aftercare needs so that they can reassess your need for medications and monitor your lab values.

## 2015-11-15 NOTE — Progress Notes (Signed)
IP PROGRESS NOTE  Subjective:   David [name] feels well this morning and reports significant improvement in his symptoms after transfusion. He is able to ambulate the hallways without any dyspnea or chest pain.  Objective:  Vital signs in last 24 hours: Temp:  [97.6 F (36.4 C)-99.7 F (37.6 C)] 98.7 F (37.1 C) (09/28 1132) Pulse Rate:  [78-101] 88 (09/28 1132) Resp:  [16-23] 20 (09/28 1132) BP: (93-142)/(58-86) 115/77 (09/28 1132) SpO2:  [98 %-100 %] 100 % (09/28 1132) Weight:  [176 lb (79.8 kg)-180 lb 8.9 oz (81.9 kg)] 180 lb 8.9 oz (81.9 kg) (09/28 0546) Weight change:  Last BM Date: 11/12/15  Intake/Output from previous day: 09/27 0701 - 09/28 0700 In: 1197.5 [P.O.:120; I.V.:150; Blood:927.5] Out: -  Alert, awake gentleman without distress. Mouth: mucous membranes moist, pharynx normal without lesions Resp: clear to auscultation bilaterally Cardio: regular rate and rhythm, S1, S2 normal, no murmur, click, rub or gallop GI: soft, non-tender; bowel sounds normal; no masses,  no organomegaly Extremities: extremities normal, atraumatic, no cyanosis or edema    Lab Results:  Recent Labs  11/14/15 1255 11/15/15 0425  WBC 7.5 7.9  HGB 6.7* 8.1*  HCT 21.7* 25.1*  PLT 229 301    BMET  Recent Labs  11/14/15 1255 11/15/15 0425  NA 133* 135  K 4.2 4.2  CL 103 105  CO2 20* 21*  GLUCOSE 221* 247*  BUN 17 16  CREATININE 1.14 1.08  CALCIUM 8.7* 8.6*    Studies/Results: No results found.  Medications: I have reviewed the patient's current medications.  Assessment/Plan:  56 year old gentleman with the following issues:  1. 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 He is status post biopsy of a pleural-based nodule which confirmed the presence of carcinoma.   He is currently receiving systemic chemotherapy with few side effects that includes worsening anemia and weight loss. The rationale for  continuing chemotherapy was discussed today in detail with the patient and his family. I explained to them that alternative options do exist including immunotherapy if he is intolerant to chemotherapy or chemotherapy isn't effective. It is reasonable to continue with chemotherapy for the time being if he has some clinical benefit despite the minor complications that are manageable at this time.  All her questions were answered today is agreeable to continue with chemotherapy which is scheduled for next week. We will repeat imaging studies to ensure adequate response for chemotherapy.  2. Anemia: Multifactorial in nature related to cancer and his treatment. We will continue supportive management and transfusion as needed. He will receive 1 unit of packed red cell transfusion and will be discharge afterwards.  3. Weight loss: We have discussed different strategies to improve his appetite and I counseled him about the possibility of losing more weight and further decline if his weight is not improved.  4. Prognosis: This was discussed with the patient's son based on his request and understand that he has an incurable malignancy with limited life expectancy depending on his response to chemotherapy.  4. Disposition: I have no objections to discharge after completing packed red cell transfusion today.   LOS: 0 days   [id number] 11/15/2015, 11:49 AM

## 2015-11-15 NOTE — Discharge Summary (Addendum)
David [name], is a 55 y.o. male  DOB Jan 14, 1961  MRN [id number].  Admission date:  11/14/2015  Admitting Physician  Murlean Iba, MD  Discharge Date:  11/15/2015   Primary MD  Crisoforo Oxford, PA-C  Recommendations for primary care physician for things to follow:  - Please check CBC, BMP during next visit. - Patient will continue with vitamin B-12 injection once every week 4 weeks, then once every month, and follow with PCP or oncology office regarding injections.   Admission Diagnosis  Symptomatic anemia [D64.9]   Discharge Diagnosis  Symptomatic anemia [D64.9]    Principal Problem:   Symptomatic anemia Active Problems:   Type 2 diabetes mellitus with complication, without long-term current use of insulin (HCC)   Loss of weight   Lymphadenopathy   Anemia, unspecified   Former smoker   Primary cancer of renal pelvis (HCC)   Pleural mass   S/P thoracentesis   Hyponatremia   Hypoalbuminemia due to protein-calorie malnutrition (HCC)   Generalized weakness   Mild dehydration      Past Medical History:  Diagnosis Date  . Cancer ([place])   . Dental caries   . Diabetes mellitus, type II ([place]) 01/2012  . Hyperlipidemia   . Influenza vaccine refused 01/18/2013  . Kidney cancer, primary, with metastasis from kidney to other site [facility])   . Obesity   . Pneumococcal vaccine refused 01/18/2013  . Scoliosis   . Tobacco use   . Vision impairment    left, due to prior trauma    Past Surgical History:  Procedure Laterality Date  . COLONOSCOPY W/ POLYPECTOMY    . EYE SURGERY  1996   due to trauma, left eye       History of present illness and  Hospital Course:     Kindly see H&P for history of present illness and admission details, please review complete Labs, Consult reports and Test reports for all details in brief  HPI  from the history and physical done on the day of admission  11/14/2015  complaint of anemia and weakness and hemoglobin noted to be 6.2.  He states he has been weak since starting his Gemzar chemotherapy (received 2 doses since cancer diagnosis, last dose was Tuesday 9/19). Denies any fever, chills, shortness of breath, dyspnea on exertion, cough, chest pain, palpitations, abdominal pain, blood in stool, dizziness, or syncope.  Denies melena.  No change in activity or appetite but has not been eating well and not drinking well.  Seen in ED for generalized weakness on 9/6 and admitted for symptomatic anemia, receiving blood transfusion 9/11. Patient closely followed by oncology (Dr. Alen Blew).   In the ED was noted to have Hg of 6.7 and 2 units PRBC ordered.     Hospital Course   Symptomatic anemia - With known B-12 and iron deficiency - Workup during previous admission including fecal occult negative, felt secondary to his acute illness cancer and chemotherapy - Glycohemoglobin 6.7 and admission, received 2 units PRBC with hemoglobin 8.1, discussed  with oncology, they recommend another units transfusion prior to discharge, the patient received a third unit PRBC. Her discharge, no follow-up H&H was done after transfusion.  B-12 deficiency - Change by mouth B-12 supplement to IM, to continue once weekly for 4 weeks then once a month, patient was not comfortable him or one of his family member to give him injections, so he'll get it done through oncology office or PCP office.  Weight loss - Tender to chemotherapy, encouraged to increase his ensure intake, continue with Megace  Renal cancer - Seen by primary oncologist Dr. Manuella Ghazi died  protein-calorie malnutrition - continue supplement   Discharge Condition:  Stable   Follow UP  Follow-up Information    TYSINGER, DAVID SHANE, PA-C Follow up in 1 week(s).   Specialty:  Family Medicine Contact information: [address] [place] [address] [phone]             Discharge  Instructions  and  Discharge Medications     Discharge Instructions    Discharge instructions    Complete by:  As directed    Follow with Primary MD Crisoforo Oxford, PA-C in 7 days   Get CBC, CMP,checked  by Primary MD next visit.    Activity: As tolerated with Full fall precautions use walker/cane & assistance as needed   Disposition Home    Diet: Carb modified  , with feeding assistance and aspiration precautions.  For Heart failure patients - Check your Weight same time everyday, if you gain over 2 pounds, or you develop in leg swelling, experience more shortness of breath or chest pain, call your Primary MD immediately. Follow Cardiac Low Salt Diet and 1.5 lit/day fluid restriction.   On your next visit with your primary care physician please Get Medicines reviewed and adjusted.   Please request your Prim.MD to go over all Hospital Tests and Procedure/Radiological results at the follow up, please get all Hospital records sent to your Prim MD by signing hospital release before you go home.   If you experience worsening of your admission symptoms, develop shortness of breath, life threatening emergency, suicidal or homicidal thoughts you must seek medical attention immediately by calling 911 or calling your MD immediately  if symptoms less severe.  You Must read complete instructions/literature along with all the possible adverse reactions/side effects for all the Medicines you take and that have been prescribed to you. Take any new Medicines after you have completely understood and accpet all the possible adverse reactions/side effects.   Do not drive, operating heavy machinery, perform activities at heights, swimming or participation in water activities or provide baby sitting services if your were admitted for syncope or siezures until you have seen by Primary MD or a Neurologist and advised to do so again.  Do not drive when taking Pain medications.    Do not take  more than prescribed Pain, Sleep and Anxiety Medications  Special Instructions: If you have smoked or chewed Tobacco  in the last 2 yrs please stop smoking, stop any regular Alcohol  and or any Recreational drug use.  Wear Seat belts while driving.   Please note  You were cared for by a hospitalist during your hospital stay. If you have any questions about your discharge medications or the care you received while you were in the hospital after you are discharged, you can call the unit and asked to speak with the hospitalist on call if the hospitalist that took care of you is not  available. Once you are discharged, your primary care physician will handle any further medical issues. Please note that NO REFILLS for any discharge medications will be authorized once you are discharged, as it is imperative that you return to your primary care physician (or establish a relationship with a primary care physician if you do not have one) for your aftercare needs so that they can reassess your need for medications and monitor your lab values.   Increase activity slowly    Complete by:  As directed        Medication List    STOP taking these medications   vitamin B-12 1000 MCG tablet Commonly known as:  CYANOCOBALAMIN Replaced by:  cyanocobalamin 1000 MCG/ML injection     TAKE these medications   carvedilol 3.125 MG tablet Commonly known as:  COREG Take 1 tablet (3.125 mg total) by mouth 2 (two) times daily with a meal.   chlorproMAZINE 50 MG tablet Commonly known as:  THORAZINE Take 1 tablet (50 mg total) by mouth 3 (three) times daily as needed for hiccoughs.   clonazePAM 0.5 MG tablet Commonly known as:  KLONOPIN 1 tablet po 1-2 times daily prn anxiety, caution due to sedation What changed:  how much to take  how to take this  when to take this  reasons to take this  additional instructions   cyanocobalamin 1000 MCG/ML injection Commonly known as:  (VITAMIN B-12) Please take  intramuscular once weekly for 4 weeks, then change to once monthly after that Replaces:  vitamin B-12 1000 MCG tablet   feeding supplement (ENSURE ENLIVE) Liqd Take 237 mLs by mouth 2 (two) times daily between meals.   ferrous sulfate 325 (65 FE) MG tablet Take 1 tablet (325 mg total) by mouth 2 (two) times daily with a meal.   HYDROcodone-acetaminophen 5-325 MG tablet Commonly known as:  NORCO/VICODIN Take 1 tablet by mouth every 6 (six) hours as needed for moderate pain.   megestrol 400 MG/10ML suspension Commonly known as:  MEGACE Take 10 mLs (400 mg total) by mouth 2 (two) times daily.   metFORMIN 500 MG tablet Commonly known as:  GLUCOPHAGE Take 1 tablet (500 mg total) by mouth daily with breakfast.   prochlorperazine 10 MG tablet Commonly known as:  COMPAZINE Take 1 tablet (10 mg total) by mouth every 6 (six) hours as needed for nausea or vomiting.         Diet and Activity recommendation: See Discharge Instructions above   Consults obtained -  Seen by oncology   Major procedures and Radiology Reports - PLEASE review detailed and final reports for all details, in brief -      Ct Abdomen Pelvis Wo Contrast  Result Date: 11/03/2015 CLINICAL DATA:  Acute onset of generalized abdominal distention. Initial encounter. EXAM: CT ABDOMEN AND PELVIS WITHOUT CONTRAST TECHNIQUE: Multidetector CT imaging of the abdomen and pelvis was performed following the standard protocol without IV contrast. COMPARISON:  CT of the abdomen and pelvis performed 09/30/2015 FINDINGS: Lower chest: Diffuse nodular pleural based density is again noted at the right lung base, apparently reflecting the patient's known pulmonary metastases. This is more diffuse than on the prior study. The underlying pleural effusion has largely resolved. Additional scattered small bilateral nodules likely reflect metastatic disease. The visualized portions of the mediastinum are unremarkable in appearance.  Hepatobiliary: The liver is unremarkable in appearance. The gallbladder is unremarkable. The common bile duct remains normal in caliber. Pancreas: The pancreas is within normal limits. Spleen: The  spleen is unremarkable in appearance. Adrenals/Urinary Tract: The previously noted ill-defined mass at the left renal hilum is again noted, though less well characterized without contrast. Nonspecific stranding is noted about the left kidney, somewhat more prominent, raising question for superimposed infection. The right kidney is grossly unremarkable. No renal or ureteral stones are identified. Adjacent mildly prominent nodes are seen. Stomach/Bowel: The stomach is grossly unremarkable in appearance. The small bowel is within normal limits. The appendix remains normal in caliber, without evidence of appendicitis. The colon is grossly unremarkable in appearance. Vascular/Lymphatic: Mild calcification is noted at the aortic bifurcation. No retroperitoneal lymphadenopathy is seen. No pelvic sidewall lymphadenopathy is identified. Reproductive: The prostate appears largely absent. The bladder is moderately distended and grossly unremarkable in appearance. Other: No additional soft tissue abnormalities are seen. Musculoskeletal: No acute osseous abnormalities are identified. The visualized musculature is unremarkable in appearance. IMPRESSION: 1. Ill-defined mass again noted at the left renal hilum, less well characterized without contrast. Associated nonspecific stranding about the left kidney. This is somewhat more prominent than on the prior study, raising question for superimposed infection. 2. Diffuse nodular pleural-based density again noted at the right lung base, apparently reflecting the patient's known pulmonary metastases. These are more diffuse than on the prior study, though the underlying pleural effusion has largely resolved. Additional scattered bilateral pulmonary nodules also seen. Electronically Signed   By:  Garald Balding M.D.   On: 11/03/2015 01:41   Dg Chest 1 View  Result Date: 10/25/2015 CLINICAL DATA:  Status post right thoracentesis - upright standing inhalation EXAM: CHEST 1 VIEW COMPARISON:  10/24/2015 FINDINGS: Following thoracentesis, there is significantly less fluid on the right. No pneumothorax. Residual right lower lung zone opacity is likely combination of residual fluid and atelectasis. Remainder of the lungs is clear. Cardiac silhouette is normal in size. No mediastinal or hilar masses or evidence of adenopathy. IMPRESSION: 1. Decreased fluid on the right following thoracentesis. No pneumothorax. Electronically Signed   By: Lajean Manes M.D.   On: 10/25/2015 13:33   Dg Chest 2 View  Result Date: 11/02/2015 CLINICAL DATA:  Abdominal pain, weakness for 2 days. EXAM: CHEST  2 VIEW COMPARISON:  Chest radiographs 10/25/2015, 10/24/2015. Chest CT 09/30/2015 FINDINGS: Small residual pleural fluid in the right lung involving the fissures is stable from prior exam. Associated right basilar opacity is likely atelectasis but nonspecific. No left pleural effusion or focal consolidation. Unchanged cardiomediastinal contours with left suprahilar prominence, corresponding to soft tissue mass on CT. No pneumothorax. No pulmonary edema. IMPRESSION: Right pleural fluid is similar to recent prior radiographs. Right basilar opacity is nonspecific, favored atelectasis. No new abnormality is seen. Electronically Signed   By: Jeb Levering M.D.   On: 11/02/2015 23:12   Dg Chest 2 View  Result Date: 10/24/2015 CLINICAL DATA:  Unexplained weight loss. EXAM: CHEST  2 VIEW COMPARISON:  CT 10/12/2015.  CT 09/30/2015. FINDINGS: Mediastinum hilar structures are normal. Cardiomegaly with pulmonary vascular prominence and bilateral interstitial prominence suggesting congestive heart failure. Prior right pleural effusion again noted. No pneumothorax. Thoracic spine scoliosis. IMPRESSION: 1. Findings suggest congestive  heart failure bilateral from interstitial edema. 2. Persistent prominent right pleural effusion.  No pneumothorax. Electronically Signed   By: Marcello Moores  Register   On: 10/24/2015 11:11   Mr Brain W Wo Contrast  Result Date: 10/17/2015 CLINICAL DATA:  Evaluation for brain metastases. EXAM: MRI HEAD WITHOUT AND WITH CONTRAST TECHNIQUE: Multiplanar, multiecho pulse sequences of the brain and surrounding structures were obtained without  and with intravenous contrast. CONTRAST:  20 mL MultiHance COMPARISON:  None. FINDINGS: Brain Parenchyma: No acute infarct or intraparenchymal hemorrhage. No abnormal parenchymal contrast enhancement. No focal parenchymal signal abnormality. No mass lesion or midline shift. The major intracranial flow voids are preserved. The midline structures are normal. Ventricles, Sulci and Extra-axial Spaces: Normal for age. No extra-axial collection. Paranasal Sinuses and Mastoids: Inflammatory changes of the left sphenoid sinus. Orbits: Normal. Bones and Soft Tissues: The visualized skull base, calvarium and extracranial soft tissues are normal. IMPRESSION: No intracranial metastatic disease. Electronically Signed   By: Ulyses Jarred M.D.   On: 10/17/2015 03:01   Dg Abd 2 Views  Result Date: 11/02/2015 CLINICAL DATA:  Abdominal and pelvic pain for 2 days. EXAM: ABDOMEN - 2 VIEW COMPARISON:  None. FINDINGS: The bowel gas pattern is normal. There is no evidence of free air. No radio-opaque calculi or other significant radiographic abnormality is seen. IMPRESSION: Negative. Electronically Signed   By: Margarette Canada M.D.   On: 11/02/2015 21:29   US Thoracentesis Asp Pleural Space W/img Guide  Result Date: 10/25/2015 INDICATION: Metastatic renal cell cancer, recurrent right pleural effusion. Request made for diagnostic and therapeutic right thoracentesis. EXAM: ULTRASOUND GUIDED DIAGNOSTIC AND THERAPEUTIC RIGHT THORACENTESIS MEDICATIONS: None. COMPLICATIONS: None immediate. PROCEDURE: An  ultrasound guided thoracentesis was thoroughly discussed with the patient and questions answered. The benefits, risks, alternatives and complications were also discussed. The patient understands and wishes to proceed with the procedure. Written consent was obtained. Ultrasound was performed to localize and mark an adequate pocket of fluid in the right chest. The area was then prepped and draped in the normal sterile fashion. 1% Lidocaine was used for local anesthesia. Under ultrasound guidance a Safe-T-Centesis catheter was introduced. Thoracentesis was performed. The catheter was removed and a dressing applied. FINDINGS: A total of approximately 980 cc of yellow fluid was removed. Samples were sent to the laboratory as requested by the clinical team. IMPRESSION: Successful ultrasound guided diagnostic and therapeutic right thoracentesis yielding 980 cc of pleural fluid. Read by: Rowe Robert, PA-C Electronically Signed   By: Corrie Mckusick D.O.   On: 10/25/2015 13:28    Micro Results     No results found for this or any previous visit (from the past 240 hour(s)).     Today   Subjective:   David [name] today has no headache,no chest abdominal pain,, feels much better after transfusion wants to go home today.   Objective:   Blood pressure 108/77, pulse 87, temperature 98.2 F (36.8 C), temperature source Oral, resp. rate 16, height 5\' 9"  (1.753 m), weight 81.9 kg (180 lb 8.9 oz), SpO2 100 %.   Intake/Output Summary (Last 24 hours) at 11/15/15 1302 Last data filed at 11/15/15 0000  Gross per 24 hour  Intake           1197.5 ml  Output                0 ml  Net           1197.5 ml    Exam Awake Alert, Oriented x 3, No new F.N deficits, Normal affect [place].AT,PERRAL Supple Neck,No JVD,  Symmetrical Chest wall movement, Good air movement bilaterally, CTAB RRR,No Gallops,Rubs or new Murmurs, No Parasternal Heave +ve B.Sounds, Abd Soft, Non tender, No organomegaly appriciated, No rebound  -guarding or rigidity. No Cyanosis, Clubbing or edema, No new Rash or bruise  Data Review   CBC w Diff: Lab Results  Component Value Date  WBC 7.9 11/15/2015   HGB 8.1 (L) 11/15/2015   HGB 7.2 (L) 11/06/2015   HCT 25.1 (L) 11/15/2015   HCT 22.5 (L) 11/06/2015   PLT 301 11/15/2015   PLT 272 11/06/2015   LYMPHOPCT 25 11/14/2015   LYMPHOPCT 13.9 (L) 11/06/2015   MONOPCT 21 11/14/2015   MONOPCT 9.6 11/06/2015   EOSPCT 1 11/14/2015   EOSPCT 0.1 11/06/2015   BASOPCT 0 11/14/2015   BASOPCT 0.5 11/06/2015    CMP: Lab Results  Component Value Date   NA 135 11/15/2015   NA 130 (L) 11/06/2015   K 4.2 11/15/2015   K 5.0 11/06/2015   CL 105 11/15/2015   CO2 21 (L) 11/15/2015   CO2 17 (L) 11/06/2015   BUN 16 11/15/2015   BUN 22.0 11/06/2015   CREATININE 1.08 11/15/2015   CREATININE 1.1 11/06/2015   PROT 6.7 11/15/2015   PROT 7.0 11/06/2015   ALBUMIN 2.2 (L) 11/15/2015   ALBUMIN 1.7 (L) 11/06/2015   BILITOT 0.6 11/15/2015   BILITOT 0.77 11/06/2015   ALKPHOS 124 11/15/2015   ALKPHOS 153 (H) 11/06/2015   AST 14 (L) 11/15/2015   AST 18 11/06/2015   ALT 27 11/15/2015   ALT 33 11/06/2015  .   Total Time in preparing paper work, data evaluation and todays exam - 35 minutes  Shaneal Barasch M.D on 11/15/2015 at 1:02 PM  [facility]   Office  [phone]

## 2015-11-15 NOTE — Care Management Note (Signed)
Case Management Note  Patient Details  Name: David [name] MRN: [id number] Date of Birth: [birth year]  Subjective/Objective:  55 y.o. M with Hx Renal Carcinoma, Pulmonary Mets.  Admitted through ED directly from PCP office with Symptomatic Anemia as he had Hgb 6.7. Received 2 U PRBC's and Hgb this am 8.1. Lives at home with wife.                   Action/Plan: No CM needs at this time but will be available should disposition/discharge needs arise.    Expected Discharge Date:   (unknown)               Expected Discharge Plan:     In-House Referral:     Discharge planning Services     Post Acute Care Choice:    Choice offered to:     DME Arranged:    DME Agency:     HH Arranged:    HH Agency:     Status of Service:     If discussed at [employer], dates discussed:    Additional Comments:  Delrae Sawyers, RN 11/15/2015, 10:46 AM

## 2015-11-15 NOTE — Telephone Encounter (Signed)
No note

## 2015-11-15 NOTE — Evaluation (Signed)
Physical Therapy Evaluation Patient Details Name: David [name] MRN: [id number] DOB: 1960/10/24 Today's Date: 11/15/2015   History of Present Illness  Pt admitted with anemia and with hx of met CA and DM  Clinical Impression  Pt admitted as above and presenting with functional mobility limitations 2* decreased endurance and mild ambulatory instability.  Pt plans dc to home with family assist and is agreeable to utilize cane for ambulation out of house.  Pt also states he will follow up with physician if he notes further deterioration of balance.    Follow Up Recommendations No PT follow up    Equipment Recommendations  None recommended by PT    Recommendations for Other Services       Precautions / Restrictions Precautions Precautions: Fall Restrictions Weight Bearing Restrictions: No      Mobility  Bed Mobility Overal bed mobility: Modified Independent                Transfers Overall transfer level: Modified independent Equipment used: None                Ambulation/Gait Ambulation/Gait assistance: Min guard;Supervision Ambulation Distance (Feet): 600 Feet Assistive device: None Gait Pattern/deviations: Step-through pattern;Shuffle;Scissoring;Wide base of support Gait velocity: mod pace   General Gait Details: Pt ambulated 600' with intermittent instability but no loss of balance.  Pt states this is normal for him and that he has used a cane in the past.  Advised pt to utilize cane if ambulating out of house.  Stairs            Wheelchair Mobility    Modified Rankin (Stroke Patients Only)       Balance Overall balance assessment: Needs assistance Sitting-balance support: No upper extremity supported Sitting balance-Leahy Scale: Normal     Standing balance support: No upper extremity supported Standing balance-Leahy Scale: Good                 High Level Balance Comments: Pt able to step bkwd and sideways without balance loss but  required assist to tandem step or stork stand.  Discussed possible follow up with OP PT and pt states if notices further deterioration in balance will follow up with physican             Pertinent Vitals/Pain Pain Assessment: No/denies pain    Home Living Family/patient expects to be discharged to:: Private residence Living Arrangements: Spouse/significant other Available Help at Discharge: Family Type of Home: House Home Access: Stairs to enter   Technical brewer of Steps: 2 Home Layout: One Junction: Environmental consultant - 2 wheels;Cane - single point;Wheelchair - manual      Prior Function Level of Independence: Independent               Hand Dominance        Extremity/Trunk Assessment   Upper Extremity Assessment: Overall WFL for tasks assessed           Lower Extremity Assessment: Overall WFL for tasks assessed      Cervical / Trunk Assessment: Normal  Communication   Communication: No difficulties  Cognition Arousal/Alertness: Awake/alert Behavior During Therapy: WFL for tasks assessed/performed Overall Cognitive Status: Within Functional Limits for tasks assessed                      General Comments      Exercises     Assessment/Plan    PT Assessment Patient needs continued PT services  PT Problem List  Decreased activity tolerance;Decreased balance;Decreased mobility;Decreased knowledge of use of DME          PT Treatment Interventions DME instruction;Gait training;Stair training;Functional mobility training;Therapeutic activities;Therapeutic exercise;Balance training;Patient/family education    PT Goals (Current goals can be found in the Care Plan section)  Acute Rehab PT Goals Patient Stated Goal: HOME PT Goal Formulation: With patient Time For Goal Achievement: 11/17/15 Potential to Achieve Goals: Good    Frequency Min 3X/week   Barriers to discharge        Co-evaluation               End of Session  Equipment Utilized During Treatment: Gait belt Activity Tolerance: Patient tolerated treatment well;Patient limited by fatigue Patient left: in chair;with call bell/phone within reach;with chair alarm set;with family/visitor present Nurse Communication: Mobility status    Functional Assessment Tool Used: Clinical judgement Functional Limitation: Mobility: Walking and moving around Mobility: Walking and Moving Around Current Status [phone]): At least 1 percent but less than 20 percent impaired, limited or restricted Mobility: Walking and Moving Around Goal Status [phone]): At least 1 percent but less than 20 percent impaired, limited or restricted    Time: 1044-1102 PT Time Calculation (min) (ACUTE ONLY): 18 min   Charges:   PT Evaluation $PT Eval Low Complexity: 1 Procedure     PT G Codes:   PT G-Codes **NOT FOR INPATIENT CLASS** Functional Assessment Tool Used: Clinical judgement Functional Limitation: Mobility: Walking and moving around Mobility: Walking and Moving Around Current Status ([id number]): At least 1 percent but less than 20 percent impaired, limited or restricted Mobility: Walking and Moving Around Goal Status [phone]): At least 1 percent but less than 20 percent impaired, limited or restricted    Livier Hendel 11/15/2015, 1:27 PM

## 2015-11-15 NOTE — Telephone Encounter (Signed)
Wife and son called. Pt is in hospital receiving blood transfusion. They were unaware that pt may need to receive blood so frequently. He has needed blood before &/or after each chemo treatment. They are questioning if this is to be expected. Is this abnormal or if they should be looking at other options for treatment such as immune therapy? They are asking for visit at hospital. Or son's name is Marguerite Olea and family asks that he be called. His cell is [phone]. They feel they need advice ASAP and not wait until 10/3 appt at [facility].

## 2015-11-15 NOTE — Progress Notes (Signed)
Patient discharged home with spouse in stable condition.  Discharge instructions with scripts given. Pt and spouse verbalize understanding.  No immediate questions and concerns.

## 2015-11-15 NOTE — Progress Notes (Signed)
CM received consult to assist pt as he will need Vit B injections weekly x 4 then monthly after discharge. Spoke with Dixie the RN for [employer], MD who the pt sees at [facility]. Pt can receive initial Vit B injection Here today prior to discharge and #2 at follow up appt 11/20/2015 with Dr Alen Blew. Will continue with tx at [facility]. No further CM needs at this time.

## 2015-11-16 LAB — TYPE AND SCREEN
ABO/RH(D): B POS
Antibody Screen: NEGATIVE
Unit division: 0
Unit division: 0
Unit division: 0

## 2015-11-16 MED FILL — PANTOPRAZOLE SOD DR 40 MG T: 40 | 30 days supply | Qty: 30 | Fill #0

## 2015-11-17 ENCOUNTER — Telehealth: Payer: Self-pay

## 2015-11-17 NOTE — Telephone Encounter (Signed)
Faxed fmla paperwork and gave to pt 11/14/15

## 2015-11-19 ENCOUNTER — Other Ambulatory Visit: Payer: Self-pay | Admitting: *Deleted

## 2015-11-20 ENCOUNTER — Other Ambulatory Visit (HOSPITAL_BASED_OUTPATIENT_CLINIC_OR_DEPARTMENT_OTHER): Payer: Medicaid Other

## 2015-11-20 ENCOUNTER — Ambulatory Visit (HOSPITAL_BASED_OUTPATIENT_CLINIC_OR_DEPARTMENT_OTHER): Payer: Medicaid Other

## 2015-11-20 ENCOUNTER — Telehealth: Payer: Self-pay | Admitting: Oncology

## 2015-11-20 ENCOUNTER — Ambulatory Visit: Payer: Self-pay

## 2015-11-20 ENCOUNTER — Ambulatory Visit (HOSPITAL_BASED_OUTPATIENT_CLINIC_OR_DEPARTMENT_OTHER): Payer: Medicaid Other | Admitting: Oncology

## 2015-11-20 ENCOUNTER — Other Ambulatory Visit: Payer: Self-pay

## 2015-11-20 ENCOUNTER — Encounter: Payer: Self-pay | Admitting: Oncology

## 2015-11-20 ENCOUNTER — Telehealth: Payer: Self-pay | Admitting: *Deleted

## 2015-11-20 VITALS — BP 120/81 | HR 95 | Temp 98.2°F | Resp 20 | Ht 69.0 in | Wt 181.4 lb

## 2015-11-20 DIAGNOSIS — D6481 Anemia due to antineoplastic chemotherapy: Secondary | ICD-10-CM | POA: Diagnosis not present

## 2015-11-20 DIAGNOSIS — D630 Anemia in neoplastic disease: Secondary | ICD-10-CM

## 2015-11-20 DIAGNOSIS — C782 Secondary malignant neoplasm of pleura: Secondary | ICD-10-CM

## 2015-11-20 DIAGNOSIS — C659 Malignant neoplasm of unspecified renal pelvis: Secondary | ICD-10-CM

## 2015-11-20 DIAGNOSIS — Z5111 Encounter for antineoplastic chemotherapy: Secondary | ICD-10-CM | POA: Diagnosis not present

## 2015-11-20 DIAGNOSIS — C642 Malignant neoplasm of left kidney, except renal pelvis: Secondary | ICD-10-CM | POA: Diagnosis not present

## 2015-11-20 LAB — COMPREHENSIVE METABOLIC PANEL
ALBUMIN: 1.9 g/dL — AB (ref 3.5–5.0)
ALK PHOS: 169 U/L — AB (ref 40–150)
ALT: 18 U/L (ref 0–55)
AST: 7 U/L (ref 5–34)
Anion Gap: 12 mEq/L — ABNORMAL HIGH (ref 3–11)
BILIRUBIN TOTAL: 0.35 mg/dL (ref 0.20–1.20)
BUN: 16.7 mg/dL (ref 7.0–26.0)
CO2: 19 mEq/L — ABNORMAL LOW (ref 22–29)
Calcium: 9.4 mg/dL (ref 8.4–10.4)
Chloride: 99 mEq/L (ref 98–109)
Creatinine: 1.2 mg/dL (ref 0.7–1.3)
EGFR: 75 mL/min/{1.73_m2} — ABNORMAL LOW (ref >90)
GLUCOSE: 482 mg/dL — AB (ref 70–140)
POTASSIUM: 4.7 meq/L (ref 3.5–5.1)
SODIUM: 130 meq/L — AB (ref 136–145)
TOTAL PROTEIN: 7.2 g/dL (ref 6.4–8.3)

## 2015-11-20 LAB — CBC WITH DIFFERENTIAL/PLATELET
BASO%: 0.8 % (ref 0.0–2.0)
BASOS ABS: 0.2 10*3/uL — AB (ref 0.0–0.1)
EOS ABS: 0.1 10*3/uL (ref 0.0–0.5)
EOS%: 0.3 % (ref 0.0–7.0)
HCT: 28.4 % — ABNORMAL LOW (ref 38.4–49.9)
HEMOGLOBIN: 9.2 g/dL — AB (ref 13.0–17.1)
LYMPH#: 3.2 10*3/uL (ref 0.9–3.3)
LYMPH%: 17.5 % (ref 14.0–49.0)
MCH: 26.7 pg — ABNORMAL LOW (ref 27.2–33.4)
MCHC: 32.4 g/dL (ref 32.0–36.0)
MCV: 82.6 fL (ref 79.3–98.0)
MONO#: 4.2 10*3/uL — AB (ref 0.1–0.9)
MONO%: 22.7 % — ABNORMAL HIGH (ref 0.0–14.0)
NEUT%: 58.7 % (ref 39.0–75.0)
NEUTROS ABS: 10.8 10*3/uL — AB (ref 1.5–6.5)
Platelets: 750 10*3/uL — ABNORMAL HIGH (ref 140–400)
RBC: 3.44 10*6/uL — ABNORMAL LOW (ref 4.20–5.82)
RDW: 17.6 % — ABNORMAL HIGH (ref 11.0–14.6)
WBC: 18.4 10*3/uL — ABNORMAL HIGH (ref 4.0–10.3)

## 2015-11-20 LAB — TECHNOLOGIST REVIEW

## 2015-11-20 MED ORDER — SODIUM CHLORIDE 0.9 % IV SOLN
Freq: Once | INTRAVENOUS | Status: AC
  Administered 2015-11-20: 11:00:00 via INTRAVENOUS

## 2015-11-20 MED ORDER — GEMCITABINE HCL CHEMO INJECTION 1 GM/26.3ML
1000.0000 mg/m2 | Freq: Once | INTRAVENOUS | Status: AC
  Administered 2015-11-20: 2090 mg via INTRAVENOUS
  Filled 2015-11-20: qty 5.26

## 2015-11-20 MED ORDER — SODIUM CHLORIDE 0.9 % IV SOLN
472.5000 mg | Freq: Once | INTRAVENOUS | Status: AC
  Administered 2015-11-20: 470 mg via INTRAVENOUS
  Filled 2015-11-20: qty 47

## 2015-11-20 MED ORDER — CLONAZEPAM 0.5 MG PO TABS: ORAL_TABLET | ORAL | 0 refills | Status: DC

## 2015-11-20 MED ORDER — PALONOSETRON HCL INJECTION 0.25 MG/5ML
INTRAVENOUS | Status: AC
  Filled 2015-11-20: qty 5

## 2015-11-20 MED ORDER — PALONOSETRON HCL INJECTION 0.25 MG/5ML
0.2500 mg | Freq: Once | INTRAVENOUS | Status: AC
  Administered 2015-11-20: 0.25 mg via INTRAVENOUS

## 2015-11-20 MED FILL — clonazePAM 0.5 MG TABS: 0.5 | 8 days supply | Qty: 15 | Fill #0

## 2015-11-20 NOTE — Patient Instructions (Signed)
[facility] Discharge Instructions for Patients Receiving Chemotherapy  Today you received the following chemotherapy agents, Gemzar.   To help prevent nausea and vomiting after your treatment, we encourage you to take your nausea medication as directed.  If you develop nausea and vomiting that is not controlled by your nausea medication, call the clinic.   BELOW ARE SYMPTOMS THAT SHOULD BE REPORTED IMMEDIATELY:  *FEVER GREATER THAN 100.5 F  *CHILLS WITH OR WITHOUT FEVER  NAUSEA AND VOMITING THAT IS NOT CONTROLLED WITH YOUR NAUSEA MEDICATION  *UNUSUAL SHORTNESS OF BREATH  *UNUSUAL BRUISING OR BLEEDING  TENDERNESS IN MOUTH AND THROAT WITH OR WITHOUT PRESENCE OF ULCERS  *URINARY PROBLEMS  *BOWEL PROBLEMS  UNUSUAL RASH Items with * indicate a potential emergency and should be followed up as soon as possible.  Feel free to call the clinic you have any questions or concerns. The clinic phone number is [phone].  Please show the [place] at check-in to the Emergency Department and triage nurse.

## 2015-11-20 NOTE — Telephone Encounter (Signed)
Called patient to give apointment information for Blood transfusion to be done at [facility] @ [facility] on 11/23/15 at 9 am. Voice mail box is full/ unable to leave voice message. 11/20/15

## 2015-11-20 NOTE — Progress Notes (Signed)
Obtained signature from patient on [employer] PAP enrollment form for [employer]. Faxed to [employer]. Fax received ok per confirmation sheet.

## 2015-11-20 NOTE — Progress Notes (Signed)
Sodium 130, Glucose 482. Per Dr. Alen Blew discontinue Decadron and proceed with treatment, no further orders at this time.

## 2015-11-20 NOTE — Progress Notes (Signed)
Hematology and Oncology Follow Up Visit  David [name] [id number] April 24, 1960 55 y.o. 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 He presented with a 5 cm mass in the central left kidney as well as pulmonary metastasis, nodules and pleural effusion.   Prior Therapy: Status post biopsy obtained on 10/12/2015 which confirmed the presence of carcinoma.  Current therapy: Chemotherapy utilizing gemcitabine and carboplatin cycle one  on 10/30/2015. He is here on day 1 of cycle 2 of therapy.  Interim History: David [name] presents today for a follow-up visit. Since the last visit, he was hospitalized briefly for symptomatic anemia on 11/14/2015 and was discharged on 11/15/2015. He received packed red cell transfusion which improved his quality of life. Since his discharge he is feeling reasonably well and his appetite is improved. He is no longer reporting any fevers or chills. He is not reporting any dyspnea on exertion. He is not reporting any hematochezia or melena. He is ready to proceed with chemotherapy. He denied any delayed complications related to chemotherapy.  He denied any respiratory symptoms including cough, wheezing or hemoptysis. He denied any pain related issues or infusion related complications.  He denied any headaches, blurry vision, syncope or seizures. He does report fevers but no chills.He does not report any chest pain, palpitation, orthopnea, leg edema or dyspnea on exertion. He denied any cough, wheezing or hemoptysis. He does not report any nausea, vomiting or early satiety. He did report constipation which have resolved. He denied any diarrhea. He does not report any frequency, urgency or hesitancy. He does not report any skeletal complaints. Remaining review of system is unremarkable.   Medications: I have reviewed the patient's current medications.  Current Outpatient Prescriptions   Medication Sig Dispense Refill  . carvedilol (COREG) 3.125 MG tablet Take 1 tablet (3.125 mg total) by mouth 2 (two) times daily with a meal. 60 tablet 0  . chlorproMAZINE (THORAZINE) 50 MG tablet Take 1 tablet (50 mg total) by mouth 3 (three) times daily as needed for hiccoughs. 90 tablet 0  . clonazePAM (KLONOPIN) 0.5 MG tablet 1 tablet po 1-2 times daily prn anxiety, caution due to sedation 15 tablet 0  . cyanocobalamin (,VITAMIN B-12,) 1000 MCG/ML injection Please take intramuscular once weekly for 4 weeks, then change to once monthly after that 1 mL 12  . feeding supplement, ENSURE ENLIVE, (ENSURE ENLIVE) LIQD Take 237 mLs by mouth 2 (two) times daily between meals. 237 mL 12  . ferrous sulfate 325 (65 FE) MG tablet Take 1 tablet (325 mg total) by mouth 2 (two) times daily with a meal. 60 tablet 3  . HYDROcodone-acetaminophen (NORCO/VICODIN) 5-325 MG tablet Take 1 tablet by mouth every 6 (six) hours as needed for moderate pain. 15 tablet 0  . megestrol (MEGACE) 400 MG/10ML suspension Take 10 mLs (400 mg total) by mouth 2 (two) times daily. 240 mL 1  . metFORMIN (GLUCOPHAGE) 500 MG tablet Take 1 tablet (500 mg total) by mouth daily with breakfast. 30 tablet 11  . pantoprazole (PROTONIX) 40 MG tablet Take 1 tablet (40 mg total) by mouth daily at 6 (six) AM. 30 tablet 3  . prochlorperazine (COMPAZINE) 10 MG tablet Take 1 tablet (10 mg total) by mouth every 6 (six) hours as needed for nausea or vomiting. 30 tablet 1   No current facility-administered medications for this visit.      Allergies: No Known Allergies  Past Medical History, Surgical  history, Social history, and Family History were reviewed and updated.   Physical Exam: Blood pressure 120/81, pulse 95, temperature 98.2 F (36.8 C), temperature source Oral, resp. rate 20, height 5\' 9"  (1.753 m), weight 181 lb 6.4 oz (82.3 kg), SpO2 100 %. ECOG: 1 General appearance: Alert, awake gentleman without distress. Head: Normocephalic,  without obvious abnormality no oral thrush or ulcers. Neck: no adenopathy Lymph nodes: Cervical, supraclavicular, and axillary nodes normal. Heart:regular rate and rhythm, S1, S2 normal, no murmur, click, rub or gallop Lung:chest clear, no wheezing, rales, normal symmetric air entry Abdomin: soft, non-tender, without masses or organomegaly no shifting dullness or ascites. EXT:no erythema, induration, or nodules   Lab Results: Lab Results  Component Value Date   WBC 18.4 (H) 11/20/2015   HGB 9.2 (L) 11/20/2015   HCT 28.4 (L) 11/20/2015   MCV 82.6 11/20/2015   PLT 750 (H) 11/20/2015     Chemistry      Component Value Date/Time   NA 130 (L) 11/20/2015 0830   K 4.7 11/20/2015 0830   CL 105 11/15/2015 0425   CO2 19 (L) 11/20/2015 0830   BUN 16.7 11/20/2015 0830   CREATININE 1.2 11/20/2015 0830      Component Value Date/Time   CALCIUM 9.4 11/20/2015 0830   ALKPHOS 169 (H) 11/20/2015 0830   AST 7 11/20/2015 0830   ALT 18 11/20/2015 0830   BILITOT 0.35 11/20/2015 0830        Impression and Plan:  55 year old gentleman with the following issues:  1. 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 He is status post biopsy of a pleural-based nodule which confirmed the presence of carcinoma although further immunohistochemical staining was not performed because of lack of tissue.  These findings confirm the presence of advanced malignancy likely of the genitourinary tract.   He is status post the first cycle of chemotherapy of gemcitabine and carboplatin that was well tolerated. He did have symptomatic anemia that required paclitaxel transfusion. The plan is to proceed with cycle 2 of therapy without any dose reduction or delay.  2. IV access: Risks and benefits of Port-A-Cath insertion were reviewed and is agreeable to proceed. We will arrange for that to be done in the near future.  3. Nausea prophylaxis: He has  prescription for antiemetics available to him.  4. Hiccups: Seems to be manageable with promethazine which is available to him at this time.  5. Weight loss: Appears to have improved slightly and gained 4 pounds in the last week.  6. Anemia: Related to malignancy and chemotherapy infusion. We will arrange periodic transfusion as supportive measures while he is on chemotherapy.  7. Follow-up: Will be in one week for day 8 of cycle 2 of chemotherapy.  [id number], MD 10/3/20179:35 AM

## 2015-11-20 NOTE — Telephone Encounter (Signed)
Per LOS I have scheduled appts and notified the scheduler. No available on 10/6 for appt, needs to move to sickle cell./

## 2015-11-20 NOTE — Telephone Encounter (Signed)
3rd attempt: Called patient to confirm appointment for Blood transfusion @ [facility], for 11/23/15 @ 9 am. Patient voice mailbox is full and was unable to leave a message. 11/20/15

## 2015-11-20 NOTE — Telephone Encounter (Signed)
Message sent to infusion scheduler to add Blood transfusion and chemo. Labs, Flush and MD appointments scheduled. Avs report and appointment schedule given to patient per, 11/20/15 los.

## 2015-11-21 ENCOUNTER — Encounter: Payer: Self-pay | Admitting: Oncology

## 2015-11-21 NOTE — Progress Notes (Signed)
Received via fax denial letter for drug assistance for Aloxi. Received voicemail from [place] stating patient was over income guidelines. Called [employer]([place]) to verify household income of 2. She asked if patient had any out of pocket expenses to date. I reviewed billing and gave her the amount showing in self-pay. She states she will sent to have the case reviewed and a decision will be  made in 24-48 hours. Faxed a copy of the transaction encounters that shows the self-pay balance due to [employer] P[employer]. Fax received ok per confirmation sheet.

## 2015-11-23 ENCOUNTER — Ambulatory Visit (HOSPITAL_COMMUNITY)
Admission: RE | Admit: 2015-11-23 | Discharge: 2015-11-23 | Disposition: A | Discharge status: Home / Self Care | Payer: Medicaid Other | Source: Ambulatory Visit | Attending: Oncology | Admitting: Oncology

## 2015-11-23 ENCOUNTER — Other Ambulatory Visit: Payer: Self-pay | Admitting: General Surgery

## 2015-11-23 ENCOUNTER — Other Ambulatory Visit: Payer: Self-pay | Admitting: *Deleted

## 2015-11-23 ENCOUNTER — Other Ambulatory Visit (HOSPITAL_BASED_OUTPATIENT_CLINIC_OR_DEPARTMENT_OTHER): Payer: Medicaid Other

## 2015-11-23 DIAGNOSIS — D649 Anemia, unspecified: Secondary | ICD-10-CM | POA: Diagnosis present

## 2015-11-23 DIAGNOSIS — C642 Malignant neoplasm of left kidney, except renal pelvis: Secondary | ICD-10-CM

## 2015-11-23 DIAGNOSIS — C782 Secondary malignant neoplasm of pleura: Secondary | ICD-10-CM

## 2015-11-23 DIAGNOSIS — C659 Malignant neoplasm of unspecified renal pelvis: Secondary | ICD-10-CM

## 2015-11-23 LAB — CBC WITH DIFFERENTIAL/PLATELET
BASO%: 0.6 % (ref 0.0–2.0)
Basophils Absolute: 0.1 10*3/uL (ref 0.0–0.1)
EOS%: 0.3 % (ref 0.0–7.0)
Eosinophils Absolute: 0 10*3/uL (ref 0.0–0.5)
HEMATOCRIT: 28.5 % — AB (ref 38.4–49.9)
HEMOGLOBIN: 9 g/dL — AB (ref 13.0–17.1)
LYMPH#: 1.3 10*3/uL (ref 0.9–3.3)
LYMPH%: 16.9 % (ref 14.0–49.0)
MCH: 26.4 pg — ABNORMAL LOW (ref 27.2–33.4)
MCHC: 31.6 g/dL — AB (ref 32.0–36.0)
MCV: 83.6 fL (ref 79.3–98.0)
MONO#: 0.2 10*3/uL (ref 0.1–0.9)
MONO%: 2.5 % (ref 0.0–14.0)
NEUT#: 6.2 10*3/uL (ref 1.5–6.5)
NEUT%: 79.7 % — ABNORMAL HIGH (ref 39.0–75.0)
Platelets: 552 10*3/uL — ABNORMAL HIGH (ref 140–400)
RBC: 3.41 10*6/uL — ABNORMAL LOW (ref 4.20–5.82)
RDW: 17.4 % — AB (ref 11.0–14.6)
WBC: 7.8 10*3/uL (ref 4.0–10.3)

## 2015-11-23 LAB — TECHNOLOGIST REVIEW

## 2015-11-23 LAB — PREPARE RBC (CROSSMATCH)

## 2015-11-23 MED ORDER — DIPHENHYDRAMINE HCL 25 MG PO CAPS
25.0000 mg | ORAL_CAPSULE | Freq: Once | ORAL | Status: AC
  Administered 2015-11-23: 25 mg via ORAL
  Filled 2015-11-23: qty 1

## 2015-11-23 MED ORDER — HEPARIN SOD (PORK) LOCK FLUSH 100 UNIT/ML IV SOLN: 500.0000 [IU] | Freq: Every day | INTRAVENOUS | Status: DC | PRN

## 2015-11-23 MED ORDER — SODIUM CHLORIDE 0.9 % IV SOLN
250.0000 mL | Freq: Once | INTRAVENOUS | Status: AC
  Administered 2015-11-23: 250 mL via INTRAVENOUS

## 2015-11-23 MED ORDER — ACETAMINOPHEN 325 MG PO TABS
650.0000 mg | ORAL_TABLET | Freq: Once | ORAL | Status: AC
  Administered 2015-11-23: 650 mg via ORAL
  Filled 2015-11-23: qty 2

## 2015-11-23 MED ORDER — SODIUM CHLORIDE 0.9% FLUSH: 3.0000 mL | INTRAVENOUS | Status: DC | PRN

## 2015-11-23 MED ORDER — HEPARIN SOD (PORK) LOCK FLUSH 100 UNIT/ML IV SOLN: 250.0000 [IU] | INTRAVENOUS | Status: DC | PRN

## 2015-11-23 MED ORDER — SODIUM CHLORIDE 0.9% FLUSH: 10.0000 mL | INTRAVENOUS | Status: DC | PRN

## 2015-11-23 NOTE — Discharge Instructions (Signed)
Blood Transfusion, Care After  These instructions give you information about caring for yourself after your procedure. Your doctor may also give you more specific instructions. Call your doctor if you have any problems or questions after your procedure.   HOME CARE   Take medicines only as told by your doctor. Ask your doctor if you can take an over-the-counter pain reliever if you have a fever or headache a day or two after your procedure.   Return to your normal activities as told by your doctor.  GET HELP IF:    You develop redness or irritation at your IV site.   You have a fever, chills, or a headache that does not go away.   Your pee (urine) is darker than normal.   Your urine turns:    Pink.    Red.    Brown.   The white part of your eye turns yellow (jaundice).   You feel weak after doing your normal activities.  GET HELP RIGHT AWAY IF:    You have trouble breathing.   You have fever and chills and you also have:    Anxiety.    Chest or back pain.    Flushed or pink skin.    Clammy or sweaty skin.    A fast heartbeat.    A sick feeling in your stomach (nausea).     This information is not intended to replace advice given to you by your health care provider. Make sure you discuss any questions you have with your health care provider.     Document Released: 02/24/2014 Document Reviewed: 02/24/2014  Elsevier Interactive Patient Education 2016 Elsevier Inc[employer]

## 2015-11-23 NOTE — Progress Notes (Signed)
Provider: Dr. Alen Blew  Associated Diagnosis: Left Renal Cancer  Procedure: Transfusion of 1 unit PRBC via PIV.  Patient tolerated transfusion well. Went over discharge instructions and copy given to patient. Discharged to home with family member. Alert, oriented and ambulatory at time of discharge.

## 2015-11-26 ENCOUNTER — Encounter (HOSPITAL_COMMUNITY): Payer: Self-pay

## 2015-11-26 ENCOUNTER — Other Ambulatory Visit: Payer: Self-pay | Admitting: Oncology

## 2015-11-26 ENCOUNTER — Ambulatory Visit (HOSPITAL_COMMUNITY)
Admission: RE | Admit: 2015-11-26 | Discharge: 2015-11-26 | Disposition: A | Discharge status: Home / Self Care | Payer: Medicaid Other | Source: Ambulatory Visit | Attending: Oncology | Admitting: Oncology

## 2015-11-26 ENCOUNTER — Encounter: Payer: Self-pay | Admitting: Oncology

## 2015-11-26 DIAGNOSIS — E785 Hyperlipidemia, unspecified: Secondary | ICD-10-CM | POA: Diagnosis not present

## 2015-11-26 DIAGNOSIS — E669 Obesity, unspecified: Secondary | ICD-10-CM | POA: Insufficient documentation

## 2015-11-26 DIAGNOSIS — C799 Secondary malignant neoplasm of unspecified site: Secondary | ICD-10-CM | POA: Insufficient documentation

## 2015-11-26 DIAGNOSIS — M419 Scoliosis, unspecified: Secondary | ICD-10-CM | POA: Insufficient documentation

## 2015-11-26 DIAGNOSIS — C689 Malignant neoplasm of urinary organ, unspecified: Secondary | ICD-10-CM | POA: Insufficient documentation

## 2015-11-26 DIAGNOSIS — E119 Type 2 diabetes mellitus without complications: Secondary | ICD-10-CM | POA: Diagnosis not present

## 2015-11-26 DIAGNOSIS — C659 Malignant neoplasm of unspecified renal pelvis: Secondary | ICD-10-CM

## 2015-11-26 DIAGNOSIS — K029 Dental caries, unspecified: Secondary | ICD-10-CM | POA: Insufficient documentation

## 2015-11-26 DIAGNOSIS — Z7984 Long term (current) use of oral hypoglycemic drugs: Secondary | ICD-10-CM | POA: Insufficient documentation

## 2015-11-26 DIAGNOSIS — Z9221 Personal history of antineoplastic chemotherapy: Secondary | ICD-10-CM | POA: Diagnosis not present

## 2015-11-26 HISTORY — PX: IR GENERIC HISTORICAL: IMG1180011

## 2015-11-26 LAB — BASIC METABOLIC PANEL
Anion gap: 10 (ref 5–15)
BUN: 18 mg/dL (ref 6–20)
CALCIUM: 8.9 mg/dL (ref 8.9–10.3)
CO2: 19 mmol/L — AB (ref 22–32)
CREATININE: 0.96 mg/dL (ref 0.61–1.24)
Chloride: 102 mmol/L (ref 101–111)
GFR calc Af Amer: >60 mL/min (ref >60)
GLUCOSE: 199 mg/dL — AB (ref 65–99)
Potassium: 4.7 mmol/L (ref 3.5–5.1)
Sodium: 131 mmol/L — ABNORMAL LOW (ref 135–145)

## 2015-11-26 LAB — CBC WITH DIFFERENTIAL/PLATELET
BASOS PCT: 3 %
Basophils Absolute: 0.2 10*3/uL — ABNORMAL HIGH (ref 0.0–0.1)
Eosinophils Absolute: 0 10*3/uL (ref 0.0–0.7)
Eosinophils Relative: 0 %
HCT: 29.8 % — ABNORMAL LOW (ref 39.0–52.0)
HEMOGLOBIN: 9.9 g/dL — AB (ref 13.0–17.0)
LYMPHS ABS: 2.4 10*3/uL (ref 0.7–4.0)
LYMPHS PCT: 35 %
MCH: 27 pg (ref 26.0–34.0)
MCHC: 33.2 g/dL (ref 30.0–36.0)
MCV: 81.2 fL (ref 78.0–100.0)
MONO ABS: 1.2 10*3/uL — AB (ref 0.1–1.0)
Monocytes Relative: 17 %
NEUTROS ABS: 3.1 10*3/uL (ref 1.7–7.7)
Neutrophils Relative %: 45 %
Platelets: 451 10*3/uL — ABNORMAL HIGH (ref 150–400)
RBC: 3.67 MIL/uL — ABNORMAL LOW (ref 4.22–5.81)
RDW: 16.9 % — AB (ref 11.5–15.5)
WBC: 6.9 10*3/uL (ref 4.0–10.5)

## 2015-11-26 LAB — PROTIME-INR
INR: 1.18
PROTHROMBIN TIME: 15.1 s (ref 11.4–15.2)

## 2015-11-26 LAB — TYPE AND SCREEN
ABO/RH(D): B POS
Antibody Screen: NEGATIVE
Unit division: 0

## 2015-11-26 LAB — GLUCOSE, CAPILLARY: GLUCOSE-CAPILLARY: 183 mg/dL — AB (ref 65–99)

## 2015-11-26 LAB — APTT: APTT: 40 s — AB (ref 24–36)

## 2015-11-26 MED ORDER — CEFAZOLIN SODIUM-DEXTROSE 2-4 GM/100ML-% IV SOLN
2.0000 g | INTRAVENOUS | Status: AC
  Administered 2015-11-26: 2 g via INTRAVENOUS
  Filled 2015-11-26: qty 100

## 2015-11-26 MED ORDER — SODIUM CHLORIDE 0.9 % IV SOLN
INTRAVENOUS | Status: DC
  Administered 2015-11-26: 08:00:00 via INTRAVENOUS

## 2015-11-26 MED ORDER — HEPARIN SOD (PORK) LOCK FLUSH 100 UNIT/ML IV SOLN
INTRAVENOUS | Status: AC | PRN
  Administered 2015-11-26: 500 [IU]

## 2015-11-26 MED ORDER — LIDOCAINE HCL 1 % IJ SOLN
INTRAMUSCULAR | Status: AC
  Filled 2015-11-26: qty 20

## 2015-11-26 MED ORDER — MIDAZOLAM HCL 2 MG/2ML IJ SOLN
INTRAMUSCULAR | Status: AC
  Filled 2015-11-26: qty 6

## 2015-11-26 MED ORDER — MIDAZOLAM HCL 2 MG/2ML IJ SOLN
INTRAMUSCULAR | Status: AC | PRN
  Administered 2015-11-26 (×5): 1 mg via INTRAVENOUS

## 2015-11-26 MED ORDER — FENTANYL CITRATE (PF) 100 MCG/2ML IJ SOLN
INTRAMUSCULAR | Status: AC | PRN
  Administered 2015-11-26: 25 ug via INTRAVENOUS
  Administered 2015-11-26: 50 ug via INTRAVENOUS

## 2015-11-26 MED ORDER — HEPARIN SOD (PORK) LOCK FLUSH 100 UNIT/ML IV SOLN
INTRAVENOUS | Status: AC
  Filled 2015-11-26: qty 5

## 2015-11-26 MED ORDER — FENTANYL CITRATE (PF) 100 MCG/2ML IJ SOLN
INTRAMUSCULAR | Status: AC
  Filled 2015-11-26: qty 4

## 2015-11-26 MED ORDER — LIDOCAINE HCL 1 % IJ SOLN
INTRAMUSCULAR | Status: AC | PRN
  Administered 2015-11-26: 10 mL via INTRADERMAL

## 2015-11-26 NOTE — Progress Notes (Signed)
Referring Physician(s): David [name]  Supervising Physician: David [name]  Patient Status:  Outpatient  Chief Complaint:  "I'm getting a port"  Subjective:  Pt familiar to IR service from prior thoracenteses and right pleural nodule biopsy in August 2017. He has a history of recently diagnosed metastatic genitourinary cancer and presents again today for port a cath placement for chemotherapy. He currently denies fever, headache, chest pain, dyspnea, cough, abdominal/back pain, nausea, vomiting or abnormal bleeding. Past Medical History:  Diagnosis Date  . Cancer ([place])   . Dental caries   . Diabetes mellitus, type II ([place]) 01/2012  . Hyperlipidemia   . Influenza vaccine refused 01/18/2013  . Kidney cancer, primary, with metastasis from kidney to other site [facility])   . Obesity   . Pneumococcal vaccine refused 01/18/2013  . Scoliosis   . Tobacco use   . Vision impairment    left, due to prior trauma   Past Surgical History:  Procedure Laterality Date  . COLONOSCOPY W/ POLYPECTOMY    . EYE SURGERY  1996   due to trauma, left eye     Allergies: Review of patient's allergies indicates no known allergies.  Medications: Prior to Admission medications   Medication Sig Start Date End Date Taking? Authorizing Provider  carvedilol (COREG) 3.125 MG tablet Take 1 tablet (3.125 mg total) by mouth 2 (two) times daily with a meal. 10/28/15  Yes Debbe Odea, MD  chlorproMAZINE (THORAZINE) 50 MG tablet Take 1 tablet (50 mg total) by mouth 3 (three) times daily as needed for hiccoughs. 10/28/15  Yes Debbe Odea, MD  clonazePAM (KLONOPIN) 0.5 MG tablet 1 tablet po 1-2 times daily prn anxiety, caution due to sedation 11/20/15  Yes David Portela, MD  cyanocobalamin (,VITAMIN B-12,) 1000 MCG/ML injection Please take intramuscular once weekly for 4 weeks, then change to once monthly after that 11/15/15  Yes Silver Huguenin Elgergawy, MD  feeding supplement, ENSURE ENLIVE, (ENSURE ENLIVE) LIQD  Take 237 mLs by mouth 2 (two) times daily between meals. 11/15/15  Yes Albertine Patricia, MD  ferrous sulfate 325 (65 FE) MG tablet Take 1 tablet (325 mg total) by mouth 2 (two) times daily with a meal. 10/28/15  Yes Debbe Odea, MD  HYDROcodone-acetaminophen (NORCO/VICODIN) 5-325 MG tablet Take 1 tablet by mouth every 6 (six) hours as needed for moderate pain. 11/03/15  Yes Antonietta Breach, PA-C  megestrol (MEGACE) 400 MG/10ML suspension Take 10 mLs (400 mg total) by mouth 2 (two) times daily. 11/06/15  Yes David Portela, MD  metFORMIN (GLUCOPHAGE) 500 MG tablet Take 1 tablet (500 mg total) by mouth daily with breakfast. 10/28/15  Yes Debbe Odea, MD  pantoprazole (PROTONIX) 40 MG tablet Take 1 tablet (40 mg total) by mouth daily at 6 (six) AM. 11/16/15  Yes Albertine Patricia, MD  prochlorperazine (COMPAZINE) 10 MG tablet Take 1 tablet (10 mg total) by mouth every 6 (six) hours as needed for nausea or vomiting. 10/29/15   Maryanna Shape, NP     Vital Signs: BP (!) 144/94 (BP Location: Right Arm)   Pulse 89   Temp 98.7 F (37.1 C) (Oral)   Resp 18   SpO2 100%   Physical Exam awake, alert. Chest clear to auscultation bilaterally. Heart with regular rate and rhythm. Abdomen soft, positive bowel sounds, nontender. Lower extremities -no edema.  Imaging: No results found.  Labs:  CBC:  Recent Labs  11/15/15 0425 11/20/15 0830 11/23/15 0755 11/26/15 0750  WBC 7.9 18.4* 7.8  6.9  HGB 8.1* 9.2* 9.0* 9.9*  HCT 25.1* 28.4* 28.5* 29.8*  PLT 301 750* 552* 451*    COAGS:  Recent Labs  10/12/15 0946 11/26/15 0750  INR 1.42 1.18  APTT 41* 40*    BMP:  Recent Labs  10/26/15 0806  11/02/15 1923  11/06/15 0855 11/14/15 1255 11/15/15 0425 11/20/15 0830  NA 134*  < > 129*  --  130* 133* 135 130*  K 4.8  < > 5.4*  < > 5.0 4.2 4.2 4.7  CL 101  --  101  --   --  103 105  --   CO2 22  < > 20*  --  17* 20* 21* 19*  GLUCOSE 251*  < > 320*  --  235* 221* 247* 482*  BUN 18  < > 35*  --   22.0 17 16 16.7  CALCIUM 8.9  < > 8.7*  --  9.4 8.7* 8.6* 9.4  CREATININE 1.33*  < > 1.53*  --  1.1 1.14 1.08 1.2  GFRNONAA 58*  --  49*  --   --  >60 >60  --   GFRAA >60  --  57*  --   --  >60 >60  --   < > = values in this interval not displayed.  LIVER FUNCTION TESTS:  Recent Labs  11/06/15 0855 11/14/15 1255 11/15/15 0425 11/20/15 0830  BILITOT 0.77 0.4 0.6 0.35  AST 18 16 14* 7  ALT 33 31 27 18   ALKPHOS 153* 140* 124 169*  PROT 7.0 7.4 6.7 7.2  ALBUMIN 1.7* 2.5* 2.2* 1.9*    Assessment and Plan: Pt with hx of recently diagnosed metastatic genitourinary cancer; plan today is for port a cath placement for chemotherapy. Risks and benefits discussed with the patient including, but not limited to bleeding, infection, pneumothorax, or fibrin sheath development and need for additional procedures.All of the patient's questions were answered, patient is agreeable to proceed.Consent signed and in chart.     Electronically Signed: D. Rowe Robert 11/26/2015, 8:52 AM   I spent a total of 20 minutes at the the patient's bedside AND on the patient's hospital floor or unit, greater than 50% of which was counseling/coordinating care for port a cath placement    Patient ID: David [name], male   DOB: 06-14-1960, 55 y.o.   MRN: [id number]

## 2015-11-26 NOTE — Procedures (Signed)
Interventional Radiology Procedure Note  Procedure: Placement of a right IJ approach single lumen PowerPort.  Tip is positioned at the superior cavoatrial junction and catheter is ready for immediate use.  Complications: No immediate Recommendations:  - Ok to shower tomorrow - Do not submerge for 7 days - Routine line care   Quadarius Henton T. Dae Antonucci, M.D Pager:  [phone]   

## 2015-11-26 NOTE — Discharge Instructions (Signed)
Implanted Port Home Guide °An implanted port is a type of central line that is placed under the skin. Central lines are used to provide IV access when treatment or nutrition needs to be given through a person's veins. Implanted ports are used for long-term IV access. An implanted port may be placed because:  °· You need IV medicine that would be irritating to the small veins in your hands or arms.   °· You need long-term IV medicines, such as antibiotics.   °· You need IV nutrition for a long period.   °· You need frequent blood draws for lab tests.   °· You need dialysis.   °Implanted ports are usually placed in the chest area, but they can also be placed in the upper arm, the abdomen, or the leg. An implanted port has two main parts:  °· Reservoir. The reservoir is round and will appear as a small, raised area under your skin. The reservoir is the part where a needle is inserted to give medicines or draw blood.   °· Catheter. The catheter is a thin, flexible tube that extends from the reservoir. The catheter is placed into a large vein. Medicine that is inserted into the reservoir goes into the catheter and then into the vein.   °HOW WILL I CARE FOR MY INCISION SITE? °Do not get the incision site wet. Bathe or shower as directed by your health care provider.  °HOW IS MY PORT ACCESSED? °Special steps must be taken to access the port:  °· Before the port is accessed, a numbing cream can be placed on the skin. This helps numb the skin over the port site.   °· Your health care provider uses a sterile technique to access the port. °· Your health care provider must put on a mask and sterile gloves. °· The skin over your port is cleaned carefully with an antiseptic and allowed to dry. °· The port is gently pinched between sterile gloves, and a needle is inserted into the port. °· Only "non-coring" port needles should be used to access the port. Once the port is accessed, a blood return should be checked. This helps  ensure that the port is in the vein and is not clogged.   °· If your port needs to remain accessed for a constant infusion, a clear (transparent) bandage will be placed over the needle site. The bandage and needle will need to be changed every week, or as directed by your health care provider.   °· Keep the bandage covering the needle clean and dry. Do not get it wet. Follow your health care provider's instructions on how to take a shower or bath while the port is accessed.   °· If your port does not need to stay accessed, no bandage is needed over the port.   °WHAT IS FLUSHING? °Flushing helps keep the port from getting clogged. Follow your health care provider's instructions on how and when to flush the port. Ports are usually flushed with saline solution or a medicine called heparin. The need for flushing will depend on how the port is used.  °· If the port is used for intermittent medicines or blood draws, the port will need to be flushed:   °· After medicines have been given.   °· After blood has been drawn.   °· As part of routine maintenance.   °· If a constant infusion is running, the port may not need to be flushed.   °HOW LONG WILL MY PORT STAY IMPLANTED? °The port can stay in for as long as your health care   provider thinks it is needed. When it is time for the port to come out, surgery will be done to remove it. The procedure is similar to the one performed when the port was put in.  °WHEN SHOULD I SEEK IMMEDIATE MEDICAL CARE? °When you have an implanted port, you should seek immediate medical care if:  °· You notice a bad smell coming from the incision site.   °· You have swelling, redness, or drainage at the incision site.   °· You have more swelling or pain at the port site or the surrounding area.   °· You have a fever that is not controlled with medicine. °  °This information is not intended to replace advice given to you by your health care provider. Make sure you discuss any questions you have with  your health care provider. °  °Document Released: 02/03/2005 Document Revised: 11/24/2012 Document Reviewed: 10/11/2012 °Elsevier Interactive Patient Education ©2016 Else[employer] Inc[employer] °Implanted Port Insertion, Care After °Refer to this sheet in the next few weeks. These instructions provide you with information on caring for yourself after your procedure. Your health care provider may also give you more specific instructions. Your treatment has been planned according to current medical practices, but problems sometimes occur. Call your health care provider if you have any problems or questions after your procedure. °WHAT TO EXPECT AFTER THE PROCEDURE °After your procedure, it is typical to have the following:  °· Discomfort at the port insertion site. Ice packs to the area will help. °· Bruising on the skin over the port. This will subside in 3-4 days. °HOME CARE INSTRUCTIONS °· After your port is placed, you will get a manufacturer's information card. The card has information about your port. Keep this card with you at all times.   °· Know what kind of port you have. There are many types of ports available.   °· Wear a medical alert bracelet in case of an emergency. This can help alert health care workers that you have a port.   °· The port can stay in for as long as your health care provider believes it is necessary.   °· A home health care nurse may give medicines and take care of the port.   °· You or a family member can get special training and directions for giving medicine and taking care of the port at home.   °SEEK MEDICAL CARE IF:  °· Your port does not flush or you are unable to get a blood return.   °· You have a fever or chills. °SEEK IMMEDIATE MEDICAL CARE IF: °· You have new fluid or pus coming from your incision.   °· You notice a bad smell coming from your incision site.   °· You have swelling, pain, or more redness at the incision or port site.   °· You have chest pain or shortness of breath. °  °This  information is not intended to replace advice given to you by your health care provider. Make sure you discuss any questions you have with your health care provider. °  °Document Released: 11/24/2012 Document Revised: 02/08/2013 Document Reviewed: 11/24/2012 °Elsevier Interactive Patient Education ©2016 Else[employer] Inc. °Moderate Conscious Sedation, Adult, Care After °Refer to this sheet in the next few weeks. These instructions provide you with information on caring for yourself after your procedure. Your health care provider may also give you more specific instructions. Your treatment has been planned according to current medical practices, but problems sometimes occur. Call your health care provider if you have any problems or questions after your   procedure. °WHAT TO EXPECT AFTER THE PROCEDURE  °After your procedure: °· You may feel sleepy, clumsy, and have poor balance for several hours. °· Vomiting may occur if you eat too soon after the procedure. °HOME CARE INSTRUCTIONS °· Do not participate in any activities where you could become injured for at least 24 hours. Do not: °¨ Drive. °¨ Swim. °¨ Ride a bicycle. °¨ Operate heavy machinery. °¨ Cook. °¨ Use power tools. °¨ Climb ladders. °¨ Work from a high place. °· Do not make important decisions or sign legal documents until you are improved. °· If you vomit, drink water, juice, or soup when you can drink without vomiting. Make sure you have little or no nausea before eating solid foods. °· Only take over-the-counter or prescription medicines for pain, discomfort, or fever as directed by your health care provider. °· Make sure you and your family fully understand everything about the medicines given to you, including what side effects may occur. °· You should not drink alcohol, take sleeping pills, or take medicines that cause drowsiness for at least 24 hours. °· If you smoke, do not smoke without supervision. °· If you are feeling better, you may resume normal  activities 24 hours after you were sedated. °· Keep all appointments with your health care provider. °SEEK MEDICAL CARE IF: °· Your skin is pale or bluish in color. °· You continue to feel nauseous or vomit. °· Your pain is getting worse and is not helped by medicine. °· You have bleeding or swelling. °· You are still sleepy or feeling clumsy after 24 hours. °SEEK IMMEDIATE MEDICAL CARE IF: °· You develop a rash. °· You have difficulty breathing. °· You develop any type of allergic problem. °· You have a fever. °MAKE SURE YOU: °· Understand these instructions. °· Will watch your condition. °· Will get help right away if you are not doing well or get worse. °  °This information is not intended to replace advice given to you by your health care provider. Make sure you discuss any questions you have with your health care provider. °  °Document Released: 11/24/2012 Document Revised: 02/24/2014 Document Reviewed: 11/24/2012 °Elsevier Interactive Patient Education ©2016 Elsevier Inc. ° °

## 2015-11-26 NOTE — Progress Notes (Unsigned)
Received approval letter from [place] for Aloxi at no cost.  Email sent to the drug replacement communication team. Placed a copy of approval to be scanned by HIM.

## 2015-11-27 ENCOUNTER — Telehealth: Payer: Self-pay

## 2015-11-27 ENCOUNTER — Other Ambulatory Visit (HOSPITAL_BASED_OUTPATIENT_CLINIC_OR_DEPARTMENT_OTHER): Payer: Medicaid Other

## 2015-11-27 ENCOUNTER — Other Ambulatory Visit: Payer: Self-pay | Admitting: Oncology

## 2015-11-27 ENCOUNTER — Other Ambulatory Visit: Payer: Self-pay | Admitting: Medical

## 2015-11-27 ENCOUNTER — Other Ambulatory Visit: Payer: Self-pay | Admitting: *Deleted

## 2015-11-27 ENCOUNTER — Other Ambulatory Visit: Payer: Self-pay

## 2015-11-27 ENCOUNTER — Ambulatory Visit (HOSPITAL_BASED_OUTPATIENT_CLINIC_OR_DEPARTMENT_OTHER): Payer: Medicaid Other

## 2015-11-27 VITALS — BP 124/84 | HR 84 | Temp 97.6°F | Resp 18

## 2015-11-27 DIAGNOSIS — Z5111 Encounter for antineoplastic chemotherapy: Secondary | ICD-10-CM | POA: Diagnosis not present

## 2015-11-27 DIAGNOSIS — C782 Secondary malignant neoplasm of pleura: Secondary | ICD-10-CM

## 2015-11-27 DIAGNOSIS — C659 Malignant neoplasm of unspecified renal pelvis: Secondary | ICD-10-CM | POA: Diagnosis not present

## 2015-11-27 LAB — TECHNOLOGIST REVIEW

## 2015-11-27 LAB — CBC WITH DIFFERENTIAL/PLATELET
BASO%: 1.4 % (ref 0.0–2.0)
Basophils Absolute: 0.2 10*3/uL — ABNORMAL HIGH (ref 0.0–0.1)
EOS%: 0.2 % (ref 0.0–7.0)
Eosinophils Absolute: 0 10*3/uL (ref 0.0–0.5)
HCT: 30.1 % — ABNORMAL LOW (ref 38.4–49.9)
HGB: 9.6 g/dL — ABNORMAL LOW (ref 13.0–17.1)
LYMPH%: 21.4 % (ref 14.0–49.0)
MCH: 26.5 pg — ABNORMAL LOW (ref 27.2–33.4)
MCHC: 32 g/dL (ref 32.0–36.0)
MCV: 82.7 fL (ref 79.3–98.0)
MONO#: 1.2 10*3/uL — ABNORMAL HIGH (ref 0.1–0.9)
MONO%: 11.3 % (ref 0.0–14.0)
NEUT%: 65.7 % (ref 39.0–75.0)
NEUTROS ABS: 7.2 10*3/uL — AB (ref 1.5–6.5)
Platelets: 341 10*3/uL (ref 140–400)
RBC: 3.64 10*6/uL — AB (ref 4.20–5.82)
RDW: 18.4 % — ABNORMAL HIGH (ref 11.0–14.6)
WBC: 10.9 10*3/uL — AB (ref 4.0–10.3)
lymph#: 2.3 10*3/uL (ref 0.9–3.3)

## 2015-11-27 LAB — COMPREHENSIVE METABOLIC PANEL
ALT: 18 U/L (ref 0–55)
AST: 10 U/L (ref 5–34)
Albumin: 2.1 g/dL — ABNORMAL LOW (ref 3.5–5.0)
Alkaline Phosphatase: 146 U/L (ref 40–150)
Anion Gap: 12 mEq/L — ABNORMAL HIGH (ref 3–11)
BILIRUBIN TOTAL: 0.34 mg/dL (ref 0.20–1.20)
BUN: 19 mg/dL (ref 7.0–26.0)
CALCIUM: 9.5 mg/dL (ref 8.4–10.4)
CHLORIDE: 104 meq/L (ref 98–109)
CO2: 21 meq/L — AB (ref 22–29)
CREATININE: 1.1 mg/dL (ref 0.7–1.3)
EGFR: 88 mL/min/{1.73_m2} — ABNORMAL LOW (ref >90)
Glucose: 275 mg/dl — ABNORMAL HIGH (ref 70–140)
Potassium: 4.5 mEq/L (ref 3.5–5.1)
SODIUM: 137 meq/L (ref 136–145)
TOTAL PROTEIN: 7.6 g/dL (ref 6.4–8.3)

## 2015-11-27 MED ORDER — LIDOCAINE-PRILOCAINE 2.5-2.5 % EX CREA
TOPICAL_CREAM | CUTANEOUS | Status: AC
  Filled 2015-11-27: qty 5

## 2015-11-27 MED ORDER — LIDOCAINE-PRILOCAINE 2.5-2.5 % EX CREA: 1.0000 "application " | TOPICAL_CREAM | CUTANEOUS | 0 refills | Status: DC | PRN

## 2015-11-27 MED ORDER — METFORMIN HCL 500 MG PO TABS: 500.0000 mg | ORAL_TABLET | Freq: Every day | ORAL | 1 refills | Status: DC

## 2015-11-27 MED ORDER — PROCHLORPERAZINE MALEATE 10 MG PO TABS
10.0000 mg | ORAL_TABLET | Freq: Once | ORAL | Status: AC
  Administered 2015-11-27: 10 mg via ORAL

## 2015-11-27 MED ORDER — SODIUM CHLORIDE 0.9 % IV SOLN
1000.0000 mg/m2 | Freq: Once | INTRAVENOUS | Status: AC
  Administered 2015-11-27: 2090 mg via INTRAVENOUS
  Filled 2015-11-27: qty 52.63

## 2015-11-27 MED ORDER — CARVEDILOL 3.125 MG PO TABS: 3.1250 mg | ORAL_TABLET | Freq: Two times a day (BID) | ORAL | 0 refills | Status: DC

## 2015-11-27 MED ORDER — SODIUM CHLORIDE 0.9% FLUSH
10.0000 mL | INTRAVENOUS | Status: DC | PRN
  Administered 2015-11-27: 10 mL
  Filled 2015-11-27: qty 10

## 2015-11-27 MED ORDER — HEPARIN SOD (PORK) LOCK FLUSH 100 UNIT/ML IV SOLN
500.0000 [IU] | Freq: Once | INTRAVENOUS | Status: AC | PRN
  Administered 2015-11-27: 500 [IU]
  Filled 2015-11-27: qty 5

## 2015-11-27 MED ORDER — PROCHLORPERAZINE MALEATE 10 MG PO TABS
ORAL_TABLET | ORAL | Status: AC
  Filled 2015-11-27: qty 1

## 2015-11-27 MED ORDER — CLONAZEPAM 0.5 MG PO TABS: ORAL_TABLET | ORAL | 1 refills | Status: DC

## 2015-11-27 MED ORDER — SODIUM CHLORIDE 0.9 % IV SOLN
Freq: Once | INTRAVENOUS | Status: AC
  Administered 2015-11-27: 11:00:00 via INTRAVENOUS

## 2015-11-27 MED ORDER — MEGESTROL ACETATE 400 MG/10ML PO SUSP: 400.0000 mg | Freq: Two times a day (BID) | ORAL | 5 refills | Status: DC

## 2015-11-27 MED FILL — metFORMIN HCL 500 MG TABS: 500 | 90 days supply | Qty: 90 | Fill #0

## 2015-11-27 MED FILL — CARVEDILOL 3.125 MG TABLET: 3.125 | 90 days supply | Qty: 180 | Fill #0

## 2015-11-27 MED FILL — MEGESTROL ACET 40 MG/ML SUS: 40 | 12 days supply | Qty: 240 | Fill #1

## 2015-11-27 MED FILL — LIDOCAINE-PRILOCAINE CREAM: 2.5-2.5 | 10 days supply | Qty: 30 | Fill #0

## 2015-11-27 NOTE — Patient Instructions (Signed)
[facility] Discharge Instructions for Patients Receiving Chemotherapy  Today you received the following chemotherapy agents, Gemzar.   To help prevent nausea and vomiting after your treatment, we encourage you to take your nausea medication as directed.  If you develop nausea and vomiting that is not controlled by your nausea medication, call the clinic.   BELOW ARE SYMPTOMS THAT SHOULD BE REPORTED IMMEDIATELY:  *FEVER GREATER THAN 100.5 F  *CHILLS WITH OR WITHOUT FEVER  NAUSEA AND VOMITING THAT IS NOT CONTROLLED WITH YOUR NAUSEA MEDICATION  *UNUSUAL SHORTNESS OF BREATH  *UNUSUAL BRUISING OR BLEEDING  TENDERNESS IN MOUTH AND THROAT WITH OR WITHOUT PRESENCE OF ULCERS  *URINARY PROBLEMS  *BOWEL PROBLEMS  UNUSUAL RASH Items with * indicate a potential emergency and should be followed up as soon as possible.  Feel free to call the clinic you have any questions or concerns. The clinic phone number is [phone].  Please show the [place] at check-in to the Emergency Department and triage nurse.

## 2015-11-27 NOTE — Telephone Encounter (Signed)
Please call in Clonazepam.   Hydrocodone can't be called or faxed in, however, this should be prescribed by his cancer doctor who will have a better idea what pain medications doses he needs.   I sent the other ones into pharmacy.  How is he doing last few days?  What is his current weight?  How are morning sugars running?

## 2015-11-27 NOTE — Telephone Encounter (Signed)
Mrs. Dufford called to let us know that pt needs refills on Megace, carvedilol, clonazepam, hydrocodone, and metformin called to [facility] pt pharm. He is currently getting his medications for free there. Victorino December

## 2015-11-27 NOTE — Telephone Encounter (Signed)
called in clonazepam per shane

## 2015-11-27 NOTE — Telephone Encounter (Signed)
Called in clonazepam pt informed and verbalized understanding he states he is doing good his current weight is 186 and his morning B/S are around 180

## 2015-11-28 ENCOUNTER — Other Ambulatory Visit: Payer: Self-pay | Admitting: Medical

## 2015-11-28 MED ORDER — INSULIN DETEMIR 100 UNIT/ML FLEXPEN: 5.0000 [IU] | PEN_INJECTOR | Freq: Every day | SUBCUTANEOUS | 2 refills | Status: DC

## 2015-11-28 MED FILL — clonazePAM 0.5 MG TABS: 0.5 | 7 days supply | Qty: 15 | Fill #0

## 2015-11-28 NOTE — Telephone Encounter (Signed)
Looking back at his sugars, I think we need to add long acting night time insulin once daily to help get sugars under control.  If he is willing to do this, then c/t metformin 500mg  daily, but lets add flexpen insulin at nigh, 5 units nightly to start.   If needed, he can come in fist to discuss.   The goal is to keep morning sugars under 130 but not go below 70.    I want to maximize his blood sugar control to keep his body responsive to his other treatments as well.

## 2015-11-28 NOTE — Telephone Encounter (Signed)
Patient is agreeable to insulin pen.  Send to [facility]

## 2015-11-28 NOTE — Telephone Encounter (Signed)
I sent the medication, and have him f/u in 2wk here for diabetes check.  Have him check sugars daily fasting in the morning

## 2015-11-28 NOTE — Telephone Encounter (Signed)
Notified pt, transferred to front staff for appt

## 2015-11-29 ENCOUNTER — Telehealth: Payer: Self-pay | Admitting: *Deleted

## 2015-11-29 ENCOUNTER — Other Ambulatory Visit: Payer: Self-pay | Admitting: Medical

## 2015-11-29 ENCOUNTER — Other Ambulatory Visit: Payer: Self-pay | Admitting: Family Medicine

## 2015-11-29 ENCOUNTER — Telehealth: Payer: Self-pay | Admitting: Family Medicine

## 2015-11-29 MED ORDER — FERROUS SULFATE 325 (65 FE) MG PO TABS: 325.0000 mg | ORAL_TABLET | Freq: Two times a day (BID) | ORAL | 3 refills | Status: DC

## 2015-11-29 MED ORDER — GLIPIZIDE 5 MG PO TABS: 5.0000 mg | ORAL_TABLET | Freq: Every day | ORAL | 2 refills | Status: DC

## 2015-11-29 MED FILL — glipiZIDE 5 MG TABS: 5 | 30 days supply | Qty: 30 | Fill #0

## 2015-11-29 NOTE — Telephone Encounter (Signed)
Pt wife called and needs iron rx to be sent to [facility] pt.  The refills are now at [facility]. Also wants sample of the Levemir pen.  Please advise pt [phone]

## 2015-11-29 NOTE — Telephone Encounter (Signed)
Wife calling to ask if grant from [facility] would cover insulin and epi pen? He has no insurance and she is not working. Left VM for lenise in financial aid dept for answers.

## 2015-11-29 NOTE — Telephone Encounter (Signed)
Changed pharmacy for the iron rx.  Called pt wife back reached voice mail [email or web address].  See Shane's instructions.

## 2015-11-29 NOTE — Telephone Encounter (Signed)
Pt's wife, Quincy Carnes, left vm stating that Levimir insulin pen is not covered under the patients free medication due to it not being a medication that he was taking at the time the application was processed for the coverage.  Asking for coupon, we do not have any, or another Rx for cheaper pen.

## 2015-11-29 NOTE — Telephone Encounter (Signed)
We will hold off on Levemir or insulin given costs.   C/t Metformin once daily but add Glipizide once daily as well.  He needs to be checking sugars daily in the morning to make sure not running low <70, but also goal to be under 150 fasting.   If he feels shaky, sweaty, weak, check sugars to make sure not under 70.    Lets have him call or My Chart message in a week to let me know how the morning sugars are running.

## 2015-11-29 NOTE — Telephone Encounter (Signed)
Notified pt's wife  

## 2015-11-29 NOTE — Telephone Encounter (Signed)
Wife called with questions in regards to Iron meds. That was given to Mr.Wenrich by another provider. She asked to leave a message on vmail.

## 2015-12-04 ENCOUNTER — Ambulatory Visit: Payer: Self-pay

## 2015-12-04 ENCOUNTER — Other Ambulatory Visit (HOSPITAL_BASED_OUTPATIENT_CLINIC_OR_DEPARTMENT_OTHER): Payer: Medicaid Other

## 2015-12-04 VITALS — BP 124/75 | HR 75 | Temp 98.2°F | Resp 18

## 2015-12-04 DIAGNOSIS — C659 Malignant neoplasm of unspecified renal pelvis: Secondary | ICD-10-CM

## 2015-12-04 LAB — CBC WITH DIFFERENTIAL/PLATELET
BASO%: 0.9 % (ref 0.0–2.0)
Basophils Absolute: 0.1 10*3/uL (ref 0.0–0.1)
EOS%: 0.1 % (ref 0.0–7.0)
Eosinophils Absolute: 0 10*3/uL (ref 0.0–0.5)
HCT: 30.9 % — ABNORMAL LOW (ref 38.4–49.9)
HGB: 9.8 g/dL — ABNORMAL LOW (ref 13.0–17.1)
LYMPH%: 39.9 % (ref 14.0–49.0)
MCH: 26.7 pg — AB (ref 27.2–33.4)
MCHC: 31.8 g/dL — AB (ref 32.0–36.0)
MCV: 84 fL (ref 79.3–98.0)
MONO#: 0.8 10*3/uL (ref 0.1–0.9)
MONO%: 12.2 % (ref 0.0–14.0)
NEUT#: 2.9 10*3/uL (ref 1.5–6.5)
NEUT%: 46.9 % (ref 39.0–75.0)
Platelets: 91 10*3/uL — ABNORMAL LOW (ref 140–400)
RBC: 3.68 10*6/uL — ABNORMAL LOW (ref 4.20–5.82)
RDW: 18.4 % — ABNORMAL HIGH (ref 11.0–14.6)
WBC: 6.2 10*3/uL (ref 4.0–10.3)
lymph#: 2.5 10*3/uL (ref 0.9–3.3)
nRBC: 0 % (ref 0–0)

## 2015-12-04 LAB — COMPREHENSIVE METABOLIC PANEL
ALT: 36 U/L (ref 0–55)
AST: 12 U/L (ref 5–34)
Albumin: 2.4 g/dL — ABNORMAL LOW (ref 3.5–5.0)
Alkaline Phosphatase: 145 U/L (ref 40–150)
Anion Gap: 13 mEq/L — ABNORMAL HIGH (ref 3–11)
BUN: 16.4 mg/dL (ref 7.0–26.0)
CALCIUM: 10 mg/dL (ref 8.4–10.4)
CHLORIDE: 105 meq/L (ref 98–109)
CO2: 20 mEq/L — ABNORMAL LOW (ref 22–29)
Creatinine: 1 mg/dL (ref 0.7–1.3)
EGFR: >90 mL/min/{1.73_m2} (ref >90)
GLUCOSE: 169 mg/dL — AB (ref 70–140)
POTASSIUM: 4.9 meq/L (ref 3.5–5.1)
SODIUM: 137 meq/L (ref 136–145)
Total Bilirubin: 0.31 mg/dL (ref 0.20–1.20)
Total Protein: 8.1 g/dL (ref 6.4–8.3)

## 2015-12-04 MED ORDER — HEPARIN SOD (PORK) LOCK FLUSH 100 UNIT/ML IV SOLN
500.0000 [IU] | Freq: Once | INTRAVENOUS | Status: AC
  Administered 2015-12-04: 500 [IU] via INTRAVENOUS
  Filled 2015-12-04: qty 5

## 2015-12-04 MED ORDER — SODIUM CHLORIDE 0.9% FLUSH
10.0000 mL | INTRAVENOUS | Status: DC | PRN
  Administered 2015-12-04: 10 mL via INTRAVENOUS
  Filled 2015-12-04: qty 10

## 2015-12-04 NOTE — Patient Instructions (Signed)

## 2015-12-11 ENCOUNTER — Ambulatory Visit: Payer: Self-pay

## 2015-12-11 ENCOUNTER — Ambulatory Visit (HOSPITAL_BASED_OUTPATIENT_CLINIC_OR_DEPARTMENT_OTHER): Payer: Medicaid Other

## 2015-12-11 ENCOUNTER — Other Ambulatory Visit (HOSPITAL_BASED_OUTPATIENT_CLINIC_OR_DEPARTMENT_OTHER): Payer: Medicaid Other

## 2015-12-11 ENCOUNTER — Telehealth: Payer: Self-pay | Admitting: *Deleted

## 2015-12-11 ENCOUNTER — Ambulatory Visit (HOSPITAL_BASED_OUTPATIENT_CLINIC_OR_DEPARTMENT_OTHER): Payer: Medicaid Other | Admitting: Oncology

## 2015-12-11 ENCOUNTER — Telehealth: Payer: Self-pay | Admitting: Oncology

## 2015-12-11 VITALS — BP 122/73 | HR 78 | Temp 98.1°F | Resp 18 | Ht 69.0 in | Wt 180.6 lb

## 2015-12-11 DIAGNOSIS — D6481 Anemia due to antineoplastic chemotherapy: Secondary | ICD-10-CM

## 2015-12-11 DIAGNOSIS — C782 Secondary malignant neoplasm of pleura: Secondary | ICD-10-CM

## 2015-12-11 DIAGNOSIS — C7800 Secondary malignant neoplasm of unspecified lung: Secondary | ICD-10-CM | POA: Diagnosis not present

## 2015-12-11 DIAGNOSIS — C652 Malignant neoplasm of left renal pelvis: Secondary | ICD-10-CM

## 2015-12-11 DIAGNOSIS — C659 Malignant neoplasm of unspecified renal pelvis: Secondary | ICD-10-CM

## 2015-12-11 DIAGNOSIS — D630 Anemia in neoplastic disease: Secondary | ICD-10-CM

## 2015-12-11 DIAGNOSIS — Z5111 Encounter for antineoplastic chemotherapy: Secondary | ICD-10-CM

## 2015-12-11 DIAGNOSIS — Z95828 Presence of other vascular implants and grafts: Secondary | ICD-10-CM

## 2015-12-11 LAB — CBC WITH DIFFERENTIAL/PLATELET
BASO%: 1.1 % (ref 0.0–2.0)
Basophils Absolute: 0.1 10*3/uL (ref 0.0–0.1)
EOS%: 0.8 % (ref 0.0–7.0)
Eosinophils Absolute: 0.1 10*3/uL (ref 0.0–0.5)
HEMATOCRIT: 26.8 % — AB (ref 38.4–49.9)
HGB: 8.8 g/dL — ABNORMAL LOW (ref 13.0–17.1)
LYMPH#: 3.3 10*3/uL (ref 0.9–3.3)
LYMPH%: 28.2 % (ref 14.0–49.0)
MCH: 26.9 pg — ABNORMAL LOW (ref 27.2–33.4)
MCHC: 32.9 g/dL (ref 32.0–36.0)
MCV: 81.8 fL (ref 79.3–98.0)
MONO#: 2.5 10*3/uL — AB (ref 0.1–0.9)
MONO%: 20.8 % — ABNORMAL HIGH (ref 0.0–14.0)
NEUT%: 49.1 % (ref 39.0–75.0)
NEUTROS ABS: 5.8 10*3/uL (ref 1.5–6.5)
PLATELETS: 748 10*3/uL — AB (ref 140–400)
RBC: 3.28 10*6/uL — ABNORMAL LOW (ref 4.20–5.82)
RDW: 19.2 % — ABNORMAL HIGH (ref 11.0–14.6)
WBC: 11.8 10*3/uL — AB (ref 4.0–10.3)

## 2015-12-11 LAB — COMPREHENSIVE METABOLIC PANEL
ALT: 21 U/L (ref 0–55)
ANION GAP: 11 meq/L (ref 3–11)
AST: 9 U/L (ref 5–34)
Albumin: 2.3 g/dL — ABNORMAL LOW (ref 3.5–5.0)
Alkaline Phosphatase: 122 U/L (ref 40–150)
BILIRUBIN TOTAL: 0.24 mg/dL (ref 0.20–1.20)
BUN: 16.4 mg/dL (ref 7.0–26.0)
CALCIUM: 9.4 mg/dL (ref 8.4–10.4)
CO2: 21 meq/L — AB (ref 22–29)
CREATININE: 0.9 mg/dL (ref 0.7–1.3)
Chloride: 103 mEq/L (ref 98–109)
EGFR: >90 mL/min/{1.73_m2} (ref >90)
Glucose: 124 mg/dl (ref 70–140)
Potassium: 4.2 mEq/L (ref 3.5–5.1)
Sodium: 135 mEq/L — ABNORMAL LOW (ref 136–145)
TOTAL PROTEIN: 7.6 g/dL (ref 6.4–8.3)

## 2015-12-11 LAB — TECHNOLOGIST REVIEW

## 2015-12-11 MED ORDER — PALONOSETRON HCL INJECTION 0.25 MG/5ML
0.2500 mg | Freq: Once | INTRAVENOUS | Status: AC
  Administered 2015-12-11: 0.25 mg via INTRAVENOUS

## 2015-12-11 MED ORDER — PALONOSETRON HCL INJECTION 0.25 MG/5ML
INTRAVENOUS | Status: AC
  Filled 2015-12-11: qty 5

## 2015-12-11 MED ORDER — DEXAMETHASONE SODIUM PHOSPHATE 10 MG/ML IJ SOLN
10.0000 mg | Freq: Once | INTRAMUSCULAR | Status: AC
  Administered 2015-12-11: 10 mg via INTRAVENOUS

## 2015-12-11 MED ORDER — GEMCITABINE HCL CHEMO INJECTION 1 GM/26.3ML
1000.0000 mg/m2 | Freq: Once | INTRAVENOUS | Status: AC
  Administered 2015-12-11: 2090 mg via INTRAVENOUS
  Filled 2015-12-11: qty 52.63

## 2015-12-11 MED ORDER — SODIUM CHLORIDE 0.9% FLUSH
10.0000 mL | INTRAVENOUS | Status: DC | PRN
  Administered 2015-12-11: 10 mL via INTRAVENOUS
  Filled 2015-12-11: qty 10

## 2015-12-11 MED ORDER — SODIUM CHLORIDE 0.9 % IV SOLN
470.0000 mg | Freq: Once | INTRAVENOUS | Status: AC
  Administered 2015-12-11: 470 mg via INTRAVENOUS
  Filled 2015-12-11: qty 47

## 2015-12-11 MED ORDER — HEPARIN SOD (PORK) LOCK FLUSH 100 UNIT/ML IV SOLN
500.0000 [IU] | Freq: Once | INTRAVENOUS | Status: AC | PRN
  Administered 2015-12-11: 500 [IU]
  Filled 2015-12-11: qty 5

## 2015-12-11 MED ORDER — SODIUM CHLORIDE 0.9 % IV SOLN
Freq: Once | INTRAVENOUS | Status: AC
  Administered 2015-12-11: 10:00:00 via INTRAVENOUS

## 2015-12-11 MED ORDER — SODIUM CHLORIDE 0.9 % IV SOLN: 10.0000 mg | Freq: Once | INTRAVENOUS | Status: DC

## 2015-12-11 MED ORDER — DEXAMETHASONE SODIUM PHOSPHATE 10 MG/ML IJ SOLN
INTRAMUSCULAR | Status: AC
  Filled 2015-12-11: qty 1

## 2015-12-11 MED ORDER — SODIUM CHLORIDE 0.9% FLUSH
10.0000 mL | INTRAVENOUS | Status: DC | PRN
  Administered 2015-12-11: 10 mL
  Filled 2015-12-11: qty 10

## 2015-12-11 NOTE — Progress Notes (Signed)
Hematology and Oncology Follow Up Visit  David [name] AN:[id number] 1960/08/31 55 y.o. 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 He presented with a 5 cm mass in the central left kidney as well as pulmonary metastasis, nodules and pleural effusion.   Prior Therapy: Status post biopsy obtained on 10/12/2015 which confirmed the presence of carcinoma.  Current therapy: Chemotherapy utilizing gemcitabine and carboplatin cycle one  on 10/30/2015. He is here on day 1 of cycle 3 of therapy.  Interim History: David [name] presents today for a follow-up visit. Since the last visit, he reports feeling reasonably well and does note improvement in his overall quality of life.  He is no longer reporting any fevers or chills. He is not reporting any dyspnea on exertion. He is not reporting any hematochezia or melena. He denied any complications related to chemotherapy including nausea, vomiting or infusion related issues.  He denied any respiratory symptoms including cough, wheezing or hemoptysis. His performance status is limited but not dramatically changed.  He denied any headaches, blurry vision, syncope or seizures. He does report fevers but no chills.He does not report any chest pain, palpitation, orthopnea, leg edema or dyspnea on exertion. He denied any cough, wheezing or hemoptysis. He does not report any nausea, vomiting or early satiety. He did report constipation which have resolved. He denied any diarrhea. He does not report any frequency, urgency or hesitancy. He does not report any skeletal complaints. Remaining review of system is unremarkable.   Medications: I have reviewed the patient's current medications.  Current Outpatient Prescriptions  Medication Sig Dispense Refill  . carvedilol (COREG) 3.125 MG tablet Take 1 tablet (3.125 mg total) by mouth 2 (two) times daily with a meal. 180 tablet 0  .  chlorproMAZINE (THORAZINE) 50 MG tablet Take 1 tablet (50 mg total) by mouth 3 (three) times daily as needed for hiccoughs. 90 tablet 0  . clonazePAM (KLONOPIN) 0.5 MG tablet TAKE 1 TABLET BY MOUTH 1-2 TIMES DAILY AS NEEDED FOR ANXIETY. MAY CAUSE SEDATION 15 tablet 0  . cyanocobalamin (,VITAMIN B-12,) 1000 MCG/ML injection Please take intramuscular once weekly for 4 weeks, then change to once monthly after that 1 mL 12  . feeding supplement, ENSURE ENLIVE, (ENSURE ENLIVE) LIQD Take 237 mLs by mouth 2 (two) times daily between meals. 237 mL 12  . ferrous sulfate 325 (65 FE) MG tablet Take 1 tablet (325 mg total) by mouth 2 (two) times daily with a meal. 60 tablet 3  . glipiZIDE (GLUCOTROL) 5 MG tablet Take 1 tablet (5 mg total) by mouth daily before breakfast. 30 tablet 2  . HYDROcodone-acetaminophen (NORCO/VICODIN) 5-325 MG tablet Take 1 tablet by mouth every 6 (six) hours as needed for moderate pain. 15 tablet 0  . lidocaine-prilocaine (EMLA) cream Apply 1 application topically as needed. 30 g 0  . megestrol (MEGACE) 400 MG/10ML suspension Take 10 mLs (400 mg total) by mouth 2 (two) times daily. 240 mL 5  . metFORMIN (GLUCOPHAGE) 500 MG tablet Take 1 tablet (500 mg total) by mouth daily with breakfast. 90 tablet 1  . pantoprazole (PROTONIX) 40 MG tablet Take 1 tablet (40 mg total) by mouth daily at 6 (six) AM. 30 tablet 3  . prochlorperazine (COMPAZINE) 10 MG tablet Take 1 tablet (10 mg total) by mouth every 6 (six) hours as needed for nausea or vomiting. 30 tablet 1   No current facility-administered medications for this visit.  Facility-Administered Medications Ordered in Other Visits  Medication Dose Route Frequency Provider Last Rate Last Dose  . sodium chloride flush (NS) 0.9 % injection 10 mL  10 mL Intravenous PRN Wyatt Portela, MD   10 mL at 12/11/15 [id number]     Allergies: No Known Allergies  Past Medical History, Surgical history, Social history, and Family History were reviewed and  updated.   Physical Exam: Blood pressure 122/73, pulse 78, temperature 98.1 F (36.7 C), temperature source Oral, resp. rate 18, height 5\' 9"  (1.753 m), weight 180 lb 9.6 oz (81.9 kg), SpO2 100 %. ECOG: 1 General appearance: Well-appearing gentleman without distress. Head: Normocephalic, without obvious abnormality no oral thrush noted. Neck: no adenopathy Lymph nodes: Cervical, supraclavicular, and axillary nodes normal. Heart:regular rate and rhythm, S1, S2 normal, no murmur, click, rub or gallop Lung:chest clear, no wheezing, rales, normal symmetric air entry Abdomin: soft, non-tender, without masses or organomegaly no rebound or guarding. EXT:no erythema, induration, or nodules   Lab Results: Lab Results  Component Value Date   WBC 6.2 12/04/2015   HGB 9.8 (L) 12/04/2015   HCT 30.9 (L) 12/04/2015   MCV 84.0 12/04/2015   PLT 91 (L) 12/04/2015     Chemistry      Component Value Date/Time   NA 137 12/04/2015 0740   K 4.9 12/04/2015 0740   CL 102 11/26/2015 0750   CO2 20 (L) 12/04/2015 0740   BUN 16.4 12/04/2015 0740   CREATININE 1.0 12/04/2015 0740      Component Value Date/Time   CALCIUM 10.0 12/04/2015 0740   ALKPHOS 145 12/04/2015 0740   AST 12 12/04/2015 0740   ALT 36 12/04/2015 0740   BILITOT 0.31 12/04/2015 0740        Impression and Plan:  55 year old gentleman with the following issues:  1. 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 He is status post biopsy of a pleural-based nodule which confirmed the presence of carcinoma although further immunohistochemical staining was not performed because of lack of tissue.  These findings confirm the presence of advanced malignancy likely of the genitourinary tract.   He is currently receiving salvage chemotherapy with carboplatin and gemcitabine. He has tolerated therapy reasonably well with improvement in his quality of life. The plan is to  proceed with cycle 3 today without any dose reduction or delay. We will repeat imaging studies after the completion of this cycle and determine the duration of chemotherapy. He is clinically improving indicating that chemotherapy have helped him at least clinically.  2. IV access: Port-A-Cath inserted without complications.  3. Nausea prophylaxis: He has prescription for antiemetics available to him.  4. Hiccups: improved dramatically at this time.  5. Weight loss: Weight remains stable with improvement overall.  6. Anemia: Related to malignancy and chemotherapy infusion. We will arrange periodic transfusion as supportive measures while he is on chemotherapy. His hemoglobin is adequate today and does not require transfusion.  7. Follow-up: Will be in one week for day 8 of cycle 3 of chemotherapy.  [id number], MD 10/24/20179:15 AM

## 2015-12-11 NOTE — Telephone Encounter (Signed)
Per LOS I have scheduled appts and notified the scheduler 

## 2015-12-11 NOTE — Telephone Encounter (Signed)
Appointments scheduled for patient per 10/24 LOS. Patient given AVS report and next scheduled appointments.

## 2015-12-11 NOTE — Telephone Encounter (Signed)
2 bottles of contrast given to patient, per CT orders, per 12/11/15 los.

## 2015-12-11 NOTE — Patient Instructions (Signed)
[facility] Discharge Instructions for Patients Receiving Chemotherapy  Today you received the following chemotherapy agents: Gemzar and Carboplatin   To help prevent nausea and vomiting after your treatment, we encourage you to take your nausea medication as directed.    If you develop nausea and vomiting that is not controlled by your nausea medication, call the clinic.   BELOW ARE SYMPTOMS THAT SHOULD BE REPORTED IMMEDIATELY:  *FEVER GREATER THAN 100.5 F  *CHILLS WITH OR WITHOUT FEVER  NAUSEA AND VOMITING THAT IS NOT CONTROLLED WITH YOUR NAUSEA MEDICATION  *UNUSUAL SHORTNESS OF BREATH  *UNUSUAL BRUISING OR BLEEDING  TENDERNESS IN MOUTH AND THROAT WITH OR WITHOUT PRESENCE OF ULCERS  *URINARY PROBLEMS  *BOWEL PROBLEMS  UNUSUAL RASH Items with * indicate a potential emergency and should be followed up as soon as possible.  Feel free to call the clinic you have any questions or concerns. The clinic phone number is [phone].  Please show the CHEMO ALERT CARD at check-in to the Emergency Department and triage nurse.   

## 2015-12-12 ENCOUNTER — Ambulatory Visit: Payer: Self-pay | Admitting: Medical

## 2015-12-12 ENCOUNTER — Other Ambulatory Visit: Payer: Self-pay | Admitting: Oncology

## 2015-12-12 DIAGNOSIS — C659 Malignant neoplasm of unspecified renal pelvis: Secondary | ICD-10-CM

## 2015-12-17 ENCOUNTER — Encounter: Payer: Self-pay | Admitting: Medical

## 2015-12-17 ENCOUNTER — Ambulatory Visit (INDEPENDENT_AMBULATORY_CARE_PROVIDER_SITE_OTHER): Payer: Medicaid Other | Admitting: Medical

## 2015-12-17 VITALS — BP 110/70 | HR 86 | Wt 180.4 lb

## 2015-12-17 DIAGNOSIS — E118 Type 2 diabetes mellitus with unspecified complications: Secondary | ICD-10-CM | POA: Diagnosis not present

## 2015-12-17 DIAGNOSIS — C659 Malignant neoplasm of unspecified renal pelvis: Secondary | ICD-10-CM

## 2015-12-17 NOTE — Progress Notes (Signed)
Subjective: Chief Complaint  Patient presents with  . follow-up    follow-up on dm, doesn't have insurance   Here for diabetes f/u.  He is seeing oncology regularly for therapy for renal cancer, anemia, protein calorie malnutrition.  Since last visit when we added Glipizide, he was starting to see some low readings under 80, felt shaky, sweaty.  He decided to stop glipizide after one week.  He continues on metformin 500mg  once daily without c/o.   He is seeing glucose readings ranging 80-170, but mostly 150-170.  Checking sugars at least daily.  otherwise appetite is good, weight is stable, getting transfusions frequently given anemia.  Oncology has him on iron and b12.  Has a good support group with family, church.  Tolerating cancer therapies.  No other aggravating or relieving factors. No other complaint.  Past Medical History:  Diagnosis Date  . Cancer ([place])   . Dental caries   . Diabetes mellitus, type II ([place]) 01/2012  . Hyperlipidemia   . Influenza vaccine refused 01/18/2013  . Kidney cancer, primary, with metastasis from kidney to other site [facility])   . Obesity   . Pneumococcal vaccine refused 01/18/2013  . Scoliosis   . Tobacco use   . Vision impairment    left, due to prior trauma   ROS as in subjective  Objective BP 110/70   Pulse 86   Wt 180 lb 6.4 oz (81.8 kg)   SpO2 98%   BMI 26.64 kg/m   Gen: wd, wn, nad   Assessment: Encounter Diagnoses  Name Primary?  . Type 2 diabetes mellitus with complication, without long-term current use of insulin ([place]) Yes  . Primary malignant neoplasm of renal pelvis, unspecified laterality ([place])      Plan: For now he has stopped glipizide.  discussed options for therapy, and advised we can use a less tight diabetes goal for sugars, but still need to modify regimen a bit.  Will continue to hold off on glipizide given hypoglycemia, will c/t Metformin 500mg  daily in morning but will add 1/2 tablet (250mg ) in afternoon of metformin.   discussed other option of adding Lantus or other basal insulin QHS if we have any issues with metformin side effects or creatinine elevation.   Reviewed most recent oncology notes and labs.   Advised he call in 2 wk to give me glucose reading updates.

## 2015-12-18 ENCOUNTER — Other Ambulatory Visit (HOSPITAL_BASED_OUTPATIENT_CLINIC_OR_DEPARTMENT_OTHER): Payer: Medicaid Other

## 2015-12-18 ENCOUNTER — Ambulatory Visit (HOSPITAL_COMMUNITY)
Admission: RE | Admit: 2015-12-18 | Discharge: 2015-12-18 | Disposition: A | Discharge status: Home / Self Care | Payer: Medicaid Other | Source: Ambulatory Visit | Attending: Oncology | Admitting: Oncology

## 2015-12-18 ENCOUNTER — Ambulatory Visit (HOSPITAL_BASED_OUTPATIENT_CLINIC_OR_DEPARTMENT_OTHER): Payer: Medicaid Other

## 2015-12-18 ENCOUNTER — Other Ambulatory Visit: Payer: Self-pay | Admitting: *Deleted

## 2015-12-18 VITALS — BP 138/85 | HR 78 | Temp 99.1°F | Resp 16

## 2015-12-18 DIAGNOSIS — C659 Malignant neoplasm of unspecified renal pelvis: Secondary | ICD-10-CM

## 2015-12-18 DIAGNOSIS — Z5111 Encounter for antineoplastic chemotherapy: Secondary | ICD-10-CM | POA: Diagnosis present

## 2015-12-18 DIAGNOSIS — D649 Anemia, unspecified: Secondary | ICD-10-CM

## 2015-12-18 DIAGNOSIS — C642 Malignant neoplasm of left kidney, except renal pelvis: Secondary | ICD-10-CM | POA: Diagnosis not present

## 2015-12-18 DIAGNOSIS — C652 Malignant neoplasm of left renal pelvis: Secondary | ICD-10-CM

## 2015-12-18 LAB — CBC WITH DIFFERENTIAL/PLATELET
BASO%: 0.9 % (ref 0.0–2.0)
BASOS ABS: 0.1 10*3/uL (ref 0.0–0.1)
EOS%: 1 % (ref 0.0–7.0)
Eosinophils Absolute: 0.1 10*3/uL (ref 0.0–0.5)
HEMATOCRIT: 27 % — AB (ref 38.4–49.9)
HGB: 8.7 g/dL — ABNORMAL LOW (ref 13.0–17.1)
LYMPH#: 3.2 10*3/uL (ref 0.9–3.3)
LYMPH%: 36.7 % (ref 14.0–49.0)
MCH: 26.7 pg — AB (ref 27.2–33.4)
MCHC: 32.2 g/dL (ref 32.0–36.0)
MCV: 82.8 fL (ref 79.3–98.0)
MONO#: 0.8 10*3/uL (ref 0.1–0.9)
MONO%: 9.2 % (ref 0.0–14.0)
NEUT#: 4.6 10*3/uL (ref 1.5–6.5)
NEUT%: 52.2 % (ref 39.0–75.0)
PLATELETS: 403 10*3/uL — AB (ref 140–400)
RBC: 3.26 10*6/uL — ABNORMAL LOW (ref 4.20–5.82)
RDW: 17.4 % — ABNORMAL HIGH (ref 11.0–14.6)
WBC: 8.7 10*3/uL (ref 4.0–10.3)

## 2015-12-18 LAB — COMPREHENSIVE METABOLIC PANEL
ALT: 35 U/L (ref 0–55)
AST: 16 U/L (ref 5–34)
Albumin: 2.5 g/dL — ABNORMAL LOW (ref 3.5–5.0)
Alkaline Phosphatase: 218 U/L — ABNORMAL HIGH (ref 40–150)
Anion Gap: 12 mEq/L — ABNORMAL HIGH (ref 3–11)
BILIRUBIN TOTAL: 0.25 mg/dL (ref 0.20–1.20)
BUN: 20.9 mg/dL (ref 7.0–26.0)
CALCIUM: 9.5 mg/dL (ref 8.4–10.4)
CO2: 19 meq/L — AB (ref 22–29)
CREATININE: 1 mg/dL (ref 0.7–1.3)
Chloride: 105 mEq/L (ref 98–109)
EGFR: >90 mL/min/{1.73_m2} (ref >90)
Glucose: 110 mg/dl (ref 70–140)
Potassium: 4.2 mEq/L (ref 3.5–5.1)
Sodium: 137 mEq/L (ref 136–145)
TOTAL PROTEIN: 7.6 g/dL (ref 6.4–8.3)

## 2015-12-18 MED ORDER — HEPARIN SOD (PORK) LOCK FLUSH 100 UNIT/ML IV SOLN
500.0000 [IU] | Freq: Once | INTRAVENOUS | Status: AC | PRN
  Administered 2015-12-18: 500 [IU]
  Filled 2015-12-18: qty 5

## 2015-12-18 MED ORDER — SODIUM CHLORIDE 0.9 % IV SOLN
Freq: Once | INTRAVENOUS | Status: AC
  Administered 2015-12-18: 10:00:00 via INTRAVENOUS

## 2015-12-18 MED ORDER — PROCHLORPERAZINE MALEATE 10 MG PO TABS
ORAL_TABLET | ORAL | Status: AC
  Filled 2015-12-18: qty 1

## 2015-12-18 MED ORDER — SODIUM CHLORIDE 0.9 % IV SOLN
1000.0000 mg/m2 | Freq: Once | INTRAVENOUS | Status: AC
  Administered 2015-12-18: 2090 mg via INTRAVENOUS
  Filled 2015-12-18: qty 52.63
  Filled 2015-12-18 (×2): qty 55

## 2015-12-18 MED ORDER — SODIUM CHLORIDE 0.9% FLUSH
10.0000 mL | INTRAVENOUS | Status: DC | PRN
  Administered 2015-12-18: 10 mL
  Filled 2015-12-18: qty 10

## 2015-12-18 MED ORDER — PROCHLORPERAZINE MALEATE 10 MG PO TABS
10.0000 mg | ORAL_TABLET | Freq: Once | ORAL | Status: AC
  Administered 2015-12-18: 10 mg via ORAL

## 2015-12-18 NOTE — Patient Instructions (Signed)
[facility] Discharge Instructions for Patients Receiving Chemotherapy  Today you received the following chemotherapy agents Gemzar  To help prevent nausea and vomiting after your treatment, we encourage you to take your nausea medication as directed.    If you develop nausea and vomiting that is not controlled by your nausea medication, call the clinic.   BELOW ARE SYMPTOMS THAT SHOULD BE REPORTED IMMEDIATELY:  *FEVER GREATER THAN 100.5 F  *CHILLS WITH OR WITHOUT FEVER  NAUSEA AND VOMITING THAT IS NOT CONTROLLED WITH YOUR NAUSEA MEDICATION  *UNUSUAL SHORTNESS OF BREATH  *UNUSUAL BRUISING OR BLEEDING  TENDERNESS IN MOUTH AND THROAT WITH OR WITHOUT PRESENCE OF ULCERS  *URINARY PROBLEMS  *BOWEL PROBLEMS  UNUSUAL RASH Items with * indicate a potential emergency and should be followed up as soon as possible.  Feel free to call the clinic you have any questions or concerns. The clinic phone number is [phone].  Please show the CHEMO ALERT CARD at check-in to the Emergency Department and triage nurse.   

## 2015-12-19 ENCOUNTER — Ambulatory Visit (HOSPITAL_COMMUNITY)
Admission: RE | Admit: 2015-12-19 | Discharge: 2015-12-19 | Disposition: A | Discharge status: Home / Self Care | Payer: Medicaid Other | Source: Ambulatory Visit | Attending: Oncology | Admitting: Oncology

## 2015-12-19 DIAGNOSIS — D649 Anemia, unspecified: Secondary | ICD-10-CM | POA: Insufficient documentation

## 2015-12-19 LAB — PREPARE RBC (CROSSMATCH)

## 2015-12-19 MED ORDER — SODIUM CHLORIDE 0.9 % IV SOLN
250.0000 mL | Freq: Once | INTRAVENOUS | Status: AC
  Administered 2015-12-19: 250 mL via INTRAVENOUS

## 2015-12-19 MED ORDER — ACETAMINOPHEN 325 MG PO TABS
650.0000 mg | ORAL_TABLET | Freq: Once | ORAL | Status: AC
  Administered 2015-12-19: 650 mg via ORAL
  Filled 2015-12-19: qty 2

## 2015-12-19 MED ORDER — SODIUM CHLORIDE 0.9% FLUSH
10.0000 mL | INTRAVENOUS | Status: AC | PRN
  Administered 2015-12-19: 10 mL

## 2015-12-19 MED ORDER — DIPHENHYDRAMINE HCL 25 MG PO CAPS
25.0000 mg | ORAL_CAPSULE | Freq: Once | ORAL | Status: AC
  Administered 2015-12-19: 25 mg via ORAL
  Filled 2015-12-19: qty 1

## 2015-12-19 MED ORDER — HEPARIN SOD (PORK) LOCK FLUSH 100 UNIT/ML IV SOLN
500.0000 [IU] | Freq: Every day | INTRAVENOUS | Status: AC | PRN
  Administered 2015-12-19: 500 [IU]
  Filled 2015-12-19: qty 5

## 2015-12-19 NOTE — Discharge Instructions (Signed)
Blood Transfusion, Care After  These instructions give you information about caring for yourself after your procedure. Your doctor may also give you more specific instructions. Call your doctor if you have any problems or questions after your procedure.   HOME CARE   Take medicines only as told by your doctor. Ask your doctor if you can take an over-the-counter pain reliever if you have a fever or headache a day or two after your procedure.   Return to your normal activities as told by your doctor.  GET HELP IF:    You develop redness or irritation at your IV site.   You have a fever, chills, or a headache that does not go away.   Your pee (urine) is darker than normal.   Your urine turns:    Pink.    Red.    Brown.   The white part of your eye turns yellow (jaundice).   You feel weak after doing your normal activities.  GET HELP RIGHT AWAY IF:    You have trouble breathing.   You have fever and chills and you also have:    Anxiety.    Chest or back pain.    Flushed or pink skin.    Clammy or sweaty skin.    A fast heartbeat.    A sick feeling in your stomach (nausea).     This information is not intended to replace advice given to you by your health care provider. Make sure you discuss any questions you have with your health care provider.     Document Released: 02/24/2014 Document Reviewed: 02/24/2014  Elsevier Interactive Patient Education 2016 Elsevier Inc[employer]

## 2015-12-19 NOTE — Progress Notes (Signed)
Diagnosis Association: Anemia, unspecified type (D64.9)  Provider: Dr. Alen Blew  Procedure: Pt received 1 unit of PRBCs via porta cath.  Pt tolerated procedure well.  Post procedure: Pt alert, oriented and ambulatory at discharge. Pt received discharged instructions with verbal understanding. Porta cath flushed per protocol.

## 2015-12-20 LAB — TYPE AND SCREEN
ABO/RH(D): B POS
Antibody Screen: NEGATIVE
UNIT DIVISION: 0

## 2015-12-25 ENCOUNTER — Other Ambulatory Visit (HOSPITAL_BASED_OUTPATIENT_CLINIC_OR_DEPARTMENT_OTHER): Payer: Medicaid Other

## 2015-12-25 DIAGNOSIS — C652 Malignant neoplasm of left renal pelvis: Secondary | ICD-10-CM | POA: Diagnosis not present

## 2015-12-25 DIAGNOSIS — C659 Malignant neoplasm of unspecified renal pelvis: Secondary | ICD-10-CM

## 2015-12-25 LAB — CBC WITH DIFFERENTIAL/PLATELET
BASO%: 0.6 % (ref 0.0–2.0)
Basophils Absolute: 0 10*3/uL (ref 0.0–0.1)
EOS%: 0.2 % (ref 0.0–7.0)
Eosinophils Absolute: 0 10*3/uL (ref 0.0–0.5)
HEMATOCRIT: 29.2 % — AB (ref 38.4–49.9)
HEMOGLOBIN: 9.6 g/dL — AB (ref 13.0–17.1)
LYMPH#: 2.5 10*3/uL (ref 0.9–3.3)
LYMPH%: 43.7 % (ref 14.0–49.0)
MCH: 27.1 pg — ABNORMAL LOW (ref 27.2–33.4)
MCHC: 32.9 g/dL (ref 32.0–36.0)
MCV: 82.4 fL (ref 79.3–98.0)
MONO#: 0.7 10*3/uL (ref 0.1–0.9)
MONO%: 12.5 % (ref 0.0–14.0)
NEUT#: 2.4 10*3/uL (ref 1.5–6.5)
NEUT%: 43 % (ref 39.0–75.0)
Platelets: 118 10*3/uL — ABNORMAL LOW (ref 140–400)
RBC: 3.55 10*6/uL — ABNORMAL LOW (ref 4.20–5.82)
RDW: 17.8 % — AB (ref 11.0–14.6)
WBC: 5.6 10*3/uL (ref 4.0–10.3)

## 2015-12-25 LAB — COMPREHENSIVE METABOLIC PANEL
ALBUMIN: 2.7 g/dL — AB (ref 3.5–5.0)
ALK PHOS: 153 U/L — AB (ref 40–150)
ALT: 15 U/L (ref 0–55)
AST: 10 U/L (ref 5–34)
Anion Gap: 10 mEq/L (ref 3–11)
BUN: 11.7 mg/dL (ref 7.0–26.0)
CALCIUM: 9.5 mg/dL (ref 8.4–10.4)
CO2: 23 mEq/L (ref 22–29)
CREATININE: 1 mg/dL (ref 0.7–1.3)
Chloride: 106 mEq/L (ref 98–109)
EGFR: >90 mL/min/{1.73_m2} (ref >90)
Glucose: 105 mg/dl (ref 70–140)
Potassium: 4.6 mEq/L (ref 3.5–5.1)
Sodium: 139 mEq/L (ref 136–145)
TOTAL PROTEIN: 7.3 g/dL (ref 6.4–8.3)
Total Bilirubin: 0.27 mg/dL (ref 0.20–1.20)

## 2015-12-27 ENCOUNTER — Ambulatory Visit (HOSPITAL_COMMUNITY)
Admission: RE | Admit: 2015-12-27 | Discharge: 2015-12-27 | Disposition: A | Discharge status: Home / Self Care | Payer: Medicaid Other | Source: Ambulatory Visit | Attending: Oncology | Admitting: Oncology

## 2015-12-27 ENCOUNTER — Other Ambulatory Visit: Payer: Self-pay | Admitting: *Deleted

## 2015-12-27 DIAGNOSIS — J910 Malignant pleural effusion: Secondary | ICD-10-CM | POA: Insufficient documentation

## 2015-12-27 DIAGNOSIS — C659 Malignant neoplasm of unspecified renal pelvis: Secondary | ICD-10-CM

## 2015-12-27 DIAGNOSIS — R93422 Abnormal radiologic findings on diagnostic imaging of left kidney: Secondary | ICD-10-CM | POA: Insufficient documentation

## 2015-12-27 DIAGNOSIS — C7951 Secondary malignant neoplasm of bone: Secondary | ICD-10-CM | POA: Diagnosis not present

## 2015-12-27 DIAGNOSIS — C782 Secondary malignant neoplasm of pleura: Secondary | ICD-10-CM | POA: Diagnosis not present

## 2015-12-27 DIAGNOSIS — C7800 Secondary malignant neoplasm of unspecified lung: Secondary | ICD-10-CM | POA: Diagnosis not present

## 2015-12-27 MED ORDER — IOPAMIDOL (ISOVUE-300) INJECTION 61%
100.0000 mL | Freq: Once | INTRAVENOUS | Status: AC | PRN
  Administered 2015-12-27: 100 mL via INTRAVENOUS

## 2016-01-01 ENCOUNTER — Ambulatory Visit: Payer: Medicaid Other

## 2016-01-01 ENCOUNTER — Telehealth: Payer: Self-pay | Admitting: *Deleted

## 2016-01-01 ENCOUNTER — Telehealth: Payer: Self-pay | Admitting: Oncology

## 2016-01-01 ENCOUNTER — Other Ambulatory Visit (HOSPITAL_BASED_OUTPATIENT_CLINIC_OR_DEPARTMENT_OTHER): Payer: Medicaid Other

## 2016-01-01 ENCOUNTER — Ambulatory Visit (HOSPITAL_BASED_OUTPATIENT_CLINIC_OR_DEPARTMENT_OTHER): Payer: Medicaid Other

## 2016-01-01 ENCOUNTER — Ambulatory Visit (HOSPITAL_BASED_OUTPATIENT_CLINIC_OR_DEPARTMENT_OTHER): Payer: Medicaid Other | Admitting: Oncology

## 2016-01-01 VITALS — BP 137/86 | HR 68 | Temp 98.2°F | Resp 18 | Wt 187.9 lb

## 2016-01-01 DIAGNOSIS — C7800 Secondary malignant neoplasm of unspecified lung: Secondary | ICD-10-CM

## 2016-01-01 DIAGNOSIS — C642 Malignant neoplasm of left kidney, except renal pelvis: Secondary | ICD-10-CM | POA: Diagnosis not present

## 2016-01-01 DIAGNOSIS — C652 Malignant neoplasm of left renal pelvis: Secondary | ICD-10-CM

## 2016-01-01 DIAGNOSIS — C659 Malignant neoplasm of unspecified renal pelvis: Secondary | ICD-10-CM

## 2016-01-01 DIAGNOSIS — D6481 Anemia due to antineoplastic chemotherapy: Secondary | ICD-10-CM | POA: Diagnosis not present

## 2016-01-01 DIAGNOSIS — J910 Malignant pleural effusion: Secondary | ICD-10-CM

## 2016-01-01 DIAGNOSIS — Z5111 Encounter for antineoplastic chemotherapy: Secondary | ICD-10-CM

## 2016-01-01 DIAGNOSIS — Z95828 Presence of other vascular implants and grafts: Secondary | ICD-10-CM

## 2016-01-01 LAB — CBC WITH DIFFERENTIAL/PLATELET
BASO%: 0.4 % (ref 0.0–2.0)
Basophils Absolute: 0 10*3/uL (ref 0.0–0.1)
EOS%: 2 % (ref 0.0–7.0)
Eosinophils Absolute: 0.2 10*3/uL (ref 0.0–0.5)
HEMATOCRIT: 29.4 % — AB (ref 38.4–49.9)
HGB: 9.4 g/dL — ABNORMAL LOW (ref 13.0–17.1)
LYMPH#: 2.8 10*3/uL (ref 0.9–3.3)
LYMPH%: 29.3 % (ref 14.0–49.0)
MCH: 27.2 pg (ref 27.2–33.4)
MCHC: 32 g/dL (ref 32.0–36.0)
MCV: 85 fL (ref 79.3–98.0)
MONO#: 1.8 10*3/uL — ABNORMAL HIGH (ref 0.1–0.9)
MONO%: 18.8 % — AB (ref 0.0–14.0)
NEUT#: 4.7 10*3/uL (ref 1.5–6.5)
NEUT%: 49.5 % (ref 39.0–75.0)
Platelets: 404 10*3/uL — ABNORMAL HIGH (ref 140–400)
RBC: 3.46 10*6/uL — AB (ref 4.20–5.82)
RDW: 18.9 % — ABNORMAL HIGH (ref 11.0–14.6)
WBC: 9.5 10*3/uL (ref 4.0–10.3)

## 2016-01-01 LAB — COMPREHENSIVE METABOLIC PANEL
ALT: 14 U/L (ref 0–55)
AST: 12 U/L (ref 5–34)
Albumin: 2.8 g/dL — ABNORMAL LOW (ref 3.5–5.0)
Alkaline Phosphatase: 134 U/L (ref 40–150)
Anion Gap: 11 mEq/L (ref 3–11)
BUN: 9.3 mg/dL (ref 7.0–26.0)
CHLORIDE: 105 meq/L (ref 98–109)
CO2: 21 meq/L — AB (ref 22–29)
CREATININE: 1 mg/dL (ref 0.7–1.3)
Calcium: 9.6 mg/dL (ref 8.4–10.4)
EGFR: >90 mL/min/{1.73_m2} (ref >90)
GLUCOSE: 117 mg/dL (ref 70–140)
POTASSIUM: 4 meq/L (ref 3.5–5.1)
SODIUM: 138 meq/L (ref 136–145)
Total Bilirubin: 0.38 mg/dL (ref 0.20–1.20)
Total Protein: 7.4 g/dL (ref 6.4–8.3)

## 2016-01-01 MED ORDER — DEXAMETHASONE SODIUM PHOSPHATE 10 MG/ML IJ SOLN
10.0000 mg | Freq: Once | INTRAMUSCULAR | Status: AC
  Administered 2016-01-01: 10 mg via INTRAVENOUS

## 2016-01-01 MED ORDER — DEXAMETHASONE SODIUM PHOSPHATE 10 MG/ML IJ SOLN
INTRAMUSCULAR | Status: AC
  Filled 2016-01-01: qty 1

## 2016-01-01 MED ORDER — SODIUM CHLORIDE 0.9% FLUSH
10.0000 mL | INTRAVENOUS | Status: DC | PRN
  Administered 2016-01-01: 10 mL via INTRAVENOUS
  Filled 2016-01-01: qty 10

## 2016-01-01 MED ORDER — GEMCITABINE HCL CHEMO INJECTION 1 GM/26.3ML
1000.0000 mg/m2 | Freq: Once | INTRAVENOUS | Status: AC
  Administered 2016-01-01: 2090 mg via INTRAVENOUS
  Filled 2016-01-01: qty 52.63

## 2016-01-01 MED ORDER — CARBOPLATIN CHEMO INJECTION 600 MG/60ML
470.0000 mg | Freq: Once | INTRAVENOUS | Status: AC
  Administered 2016-01-01: 470 mg via INTRAVENOUS
  Filled 2016-01-01: qty 47

## 2016-01-01 MED ORDER — HEPARIN SOD (PORK) LOCK FLUSH 100 UNIT/ML IV SOLN
500.0000 [IU] | Freq: Once | INTRAVENOUS | Status: AC | PRN
  Administered 2016-01-01: 500 [IU]
  Filled 2016-01-01: qty 5

## 2016-01-01 MED ORDER — PALONOSETRON HCL INJECTION 0.25 MG/5ML
INTRAVENOUS | Status: AC
  Filled 2016-01-01: qty 5

## 2016-01-01 MED ORDER — SODIUM CHLORIDE 0.9% FLUSH
10.0000 mL | INTRAVENOUS | Status: DC | PRN
  Administered 2016-01-01: 10 mL
  Filled 2016-01-01: qty 10

## 2016-01-01 MED ORDER — SODIUM CHLORIDE 0.9 % IV SOLN
Freq: Once | INTRAVENOUS | Status: AC
  Administered 2016-01-01: 10:00:00 via INTRAVENOUS

## 2016-01-01 MED ORDER — PALONOSETRON HCL INJECTION 0.25 MG/5ML
0.2500 mg | Freq: Once | INTRAVENOUS | Status: AC
  Administered 2016-01-01: 0.25 mg via INTRAVENOUS

## 2016-01-01 NOTE — Progress Notes (Signed)
Hematology and Oncology Follow Up Visit  David [name] [id number] 01-04-1961 55 y.o. 01/01/2016 8:58 AM  Principle Diagnosis: 55 year old gentleman with metastatic carcinoma arising from the left kidney and the renal pelvis. He presented with a 5 cm mass in the central left kidney as well as pulmonary metastasis, nodules and pleural effusion.   Prior Therapy: Status post biopsy obtained on 10/12/2015 which confirmed the presence of carcinoma. 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  Current therapy: Chemotherapy with gemcitabine and carboplatin cycle one  on 10/30/2015. He is here on day 1 of cycle 4 of therapy.  Interim History: David [name] presents today for a follow-up visit. Since the last visit, he continues to report clinical improvement in his overall health. He tolerated chemotherapy reasonably well and continues to have improvement in his appetite. He has gained close to 8 pounds in the last 2 weeks.  He is no longer reporting any fevers or chills. He is not reporting any dyspnea on exertion. He is not reporting any hematochezia or melena. He denied any complications related to chemotherapy including nausea, vomiting or infusion related issues. He does report urinary frequency but no hematuria or dysuria. He denies any respiratory symptoms of cough or dyspnea on exertion.   He denied any headaches, blurry vision, syncope or seizures. He does report fevers but no chills.He does not report any chest pain, palpitation, orthopnea, leg edema or dyspnea on exertion. He denied any cough, wheezing or hemoptysis. He does not report any nausea, vomiting or early satiety. He did report constipation which have resolved. He denied any diarrhea. He does not report any frequency, urgency or hesitancy. He does not report any skeletal complaints. Remaining review of system is unremarkable.   Medications: I have reviewed the patient's current medications.  Current  Outpatient Prescriptions  Medication Sig Dispense Refill  . carvedilol (COREG) 3.125 MG tablet Take 1 tablet (3.125 mg total) by mouth 2 (two) times daily with a meal. 180 tablet 0  . chlorproMAZINE (THORAZINE) 50 MG tablet Take 1 tablet (50 mg total) by mouth 3 (three) times daily as needed for hiccoughs. 90 tablet 0  . clonazePAM (KLONOPIN) 0.5 MG tablet TAKE 1 TABLET BY MOUTH 1-2 TIMES DAILY AS NEEDED FOR ANXIETY. MAY CAUSE SEDATION 15 tablet 0  . cyanocobalamin (,VITAMIN B-12,) 1000 MCG/ML injection Please take intramuscular once weekly for 4 weeks, then change to once monthly after that 1 mL 12  . ferrous sulfate 325 (65 FE) MG tablet Take 1 tablet (325 mg total) by mouth 2 (two) times daily with a meal. 60 tablet 3  . glipiZIDE (GLUCOTROL) 5 MG tablet Take 1 tablet (5 mg total) by mouth daily before breakfast. 30 tablet 2  . HYDROcodone-acetaminophen (NORCO/VICODIN) 5-325 MG tablet Take 1 tablet by mouth every 6 (six) hours as needed for moderate pain. 15 tablet 0  . lidocaine-prilocaine (EMLA) cream Apply 1 application topically as needed. 30 g 0  . megestrol (MEGACE) 400 MG/10ML suspension Take 10 mLs (400 mg total) by mouth 2 (two) times daily. 240 mL 5  . metFORMIN (GLUCOPHAGE) 500 MG tablet Take 1 tablet (500 mg total) by mouth daily with breakfast. 90 tablet 1  . pantoprazole (PROTONIX) 40 MG tablet Take 1 tablet (40 mg total) by mouth daily at 6 (six) AM. 30 tablet 3  . prochlorperazine (COMPAZINE) 10 MG tablet Take 1 tablet (10 mg total) by mouth every 6 (six) hours as needed for nausea or vomiting. 30 tablet  1   No current facility-administered medications for this visit.      Allergies: No Known Allergies  Past Medical History, Surgical history, Social history, and Family History were reviewed and updated.   Physical Exam: Blood pressure 137/86, pulse 68, temperature 98.2 F (36.8 C), temperature source Oral, resp. rate 18, weight 187 lb 14.4 oz (85.2 kg), SpO2 99 %. ECOG:  1 General appearance: Alert, awake gentleman appeared without distress. Head: Normocephalic, without obvious abnormality no oral ulcers or lesions. Neck: no adenopathy Lymph nodes: Cervical, supraclavicular, and axillary nodes normal. Heart:regular rate and rhythm, S1, S2 normal, no murmur, click, rub or gallop Lung:chest clear, no wheezing, rales, normal symmetric air entry Abdomin: soft, non-tender, without masses or organomegaly no shifting dullness or ascites. EXT:no erythema, induration, or nodules   Lab Results: Lab Results  Component Value Date   WBC 9.5 01/01/2016   HGB 9.4 (L) 01/01/2016   HCT 29.4 (L) 01/01/2016   MCV 85.0 01/01/2016   PLT 404 (H) 01/01/2016     Chemistry      Component Value Date/Time   NA 138 01/01/2016 0805   K 4.0 01/01/2016 0805   CL 102 11/26/2015 0750   CO2 21 (L) 01/01/2016 0805   BUN 9.3 01/01/2016 0805   CREATININE 1.0 01/01/2016 0805      Component Value Date/Time   CALCIUM 9.6 01/01/2016 0805   ALKPHOS 134 01/01/2016 0805   AST 12 01/01/2016 0805   ALT 14 01/01/2016 0805   BILITOT 0.38 01/01/2016 0805     EXAM: CT CHEST WITH CONTRAST  CT ABDOMEN WITHOUT AND WITH CONTRAST  TECHNIQUE: Multidetector CT imaging of the abdomen was performed without intravenous contrast. Multidetector CT imaging of the chest and abdomen was then performed during bolus administration of intravenous contrast.  CONTRAST:  175mL ISOVUE-300 IOPAMIDOL (ISOVUE-300) INJECTION 61%  COMPARISON:  09/30/2015  FINDINGS: CT CHEST FINDINGS  Cardiovascular: No acute findings.  Mediastinum/Lymph Nodes: Left suprahilar lymphadenopathy has decreased in size and shows central necrosis. This currently measures 1.6 x 2.0 cm on image 37/11 compared to 2.5 x 2.8 cm previously. No mediastinal or other thoracic lymphadenopathy identified.  Lungs/Pleura: Tiny right pleural effusion is seen which is decreased in size since previous study. Pleural soft  tissue nodularity has also decreased since previous study, consistent with decreased pleural metastatic disease.  Bilateral pulmonary nodules have also decreased in size and number since previous study. Index nodule in right upper lobe measures 3 mm on image 42/18 compared to 7 mm previously.  Musculoskeletal: Small sclerotic bone lesions involving several left ribs show no significant change, suspicious for bone metastases.  CT ABDOMEN AND PELVIS FINDINGS  Hepatobiliary: A 6 mm flash filling lesion is seen in segment 2 of the left hepatic lobe on image 82/6 only on arterial phase. This may represent a benign lesion such as a tiny hemangioma or focal nodular hyperplasia, although hypervascular metastasis cannot definitely be excluded. No other liver lesions are identified.  Tiny calcified gallstone is seen without evidence of cholecystitis or biliary ductal dilatation.  Pancreas:  No mass or inflammatory changes.  Spleen:  Within normal limits in size and appearance.  Adrenals/Urinary tract: Normal adrenal glands. Normal appearance of right kidney. Ill-defined mass in the central mid pole of the left kidney measures 2.5 x 3.1 cm on image 107/6, compared to 3.3 x 5.1 cm previously. Decrease in left renal caliectasis since previous study.  Stomach/Bowel: No evidence of obstruction, inflammatory process, or abnormal fluid collections. Mild sigmoid  diverticulosis noted, without evidence of diverticulitis.  Vascular/Lymphatic: No pathologically enlarged lymph nodes identified. No abdominal aortic aneurysm. Aortic atherosclerosis.  Reproductive:  No mass or other significant abnormality identified.  Other:  None.  Musculoskeletal: Sclerotic bone lesions are again seen involving the right sacrum, bilateral iliac bones, L1 vertebral body and several left ribs. These are consistent with sclerotic bone metastases but show no significant change since previous  study.  IMPRESSION: Decreased size of ill-defined mass in the central mid pole the left kidney.  Decreased right pleural metastases and malignant right pleural effusion.  Decreased lung metastases and left suprahilar lymphadenopathy.  Stable sclerotic bone metastases.  6 mm arterially enhancing lesion in left hepatic lobe, which is difficult to characterize due to its small size. This may represent a benign lesion such as a hemangioma or focal nodular hyperplasia, however hypervascular metastasis cannot definitely be excluded. Recommend continued attention on follow-up CT, for further evaluation with abdomen MRI without and with contrast in 3-6 months. This recommendation follows ACR consensus guidelines: Management of Incidental Liver Lesions on CT: A White Paper of the ACR Incidental Findings Committee. J Am Coll Radiol 2017ZJ:[id number].    Impression and Plan:  55 year old gentleman with the following issues:  1. 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 He is status post biopsy of a pleural-based nodule which confirmed the presence of carcinoma although further immunohistochemical staining was not performed because of lack of tissue.  These findings confirm the presence of advanced malignancy likely of the genitourinary tract.   He is currently receiving salvage chemotherapy with carboplatin and gemcitabine. He completed 3 cycles of therapy without any major complications. He did note significant clinical improvement since been on chemotherapy.  CT scan on 12/27/2015 was discussed today and showed a positive response. His tumor bulk of certainly decreased without any new masses or lesions.  The plan is to continue with the same dose and schedule for a total of 6 cycles. He will start cycle 4 today without any reduction or delay.  2. IV access: Port-A-Cath has been utilized without  complications.  3. Nausea prophylaxis: He has prescription for antiemetics available to him.  4. Hiccups: These have resolved at this time with reduction of the cancer lobe.  5. Weight loss: Weight continues to improve.  6. Anemia: Related to malignancy and chemotherapy infusion. We will arrange periodic transfusion as supportive measures while he is on chemotherapy. His hemoglobin is adequate today and does not require transfusion.  7. Follow-up: Will be in one week for day 8 of cycle 4 of chemotherapy.  [facility], MD 11/14/20178:58 AM

## 2016-01-01 NOTE — Patient Instructions (Signed)
[place] Discharge Instructions for Patients Receiving Chemotherapy  Today you received the following chemotherapy agents Gemzar and Carboplatin  To help prevent nausea and vomiting after your treatment, we encourage you to take your nausea medication as directed.  Do not take Zofran for next 3 days.   If you develop nausea and vomiting that is not controlled by your nausea medication, call the clinic.   BELOW ARE SYMPTOMS THAT SHOULD BE REPORTED IMMEDIATELY:  *FEVER GREATER THAN 100.5 F  *CHILLS WITH OR WITHOUT FEVER  NAUSEA AND VOMITING THAT IS NOT CONTROLLED WITH YOUR NAUSEA MEDICATION  *UNUSUAL SHORTNESS OF BREATH  *UNUSUAL BRUISING OR BLEEDING  TENDERNESS IN MOUTH AND THROAT WITH OR WITHOUT PRESENCE OF ULCERS  *URINARY PROBLEMS  *BOWEL PROBLEMS  UNUSUAL RASH Items with * indicate a potential emergency and should be followed up as soon as possible.  Feel free to call the clinic you have any questions or concerns. The clinic phone number is [phone].  Please show the [place] at check-in to the Emergency Department and triage nurse.

## 2016-01-01 NOTE — Telephone Encounter (Signed)
Per LOS I have scheduled appts and notified the scheduler 

## 2016-01-01 NOTE — Telephone Encounter (Signed)
Message sent to chemo scheduler to add chemo,per 01/01/16 los.  Appointments scheduled per 01/01/16 los.  Copy of  AVS report and appointment schedule was given to patient, per 01/01/16 los.

## 2016-01-08 ENCOUNTER — Ambulatory Visit (HOSPITAL_BASED_OUTPATIENT_CLINIC_OR_DEPARTMENT_OTHER): Payer: Medicaid Other

## 2016-01-08 ENCOUNTER — Other Ambulatory Visit (HOSPITAL_BASED_OUTPATIENT_CLINIC_OR_DEPARTMENT_OTHER): Payer: Medicaid Other

## 2016-01-08 VITALS — BP 119/83 | HR 77 | Temp 98.5°F | Resp 18

## 2016-01-08 DIAGNOSIS — Z5111 Encounter for antineoplastic chemotherapy: Secondary | ICD-10-CM | POA: Diagnosis not present

## 2016-01-08 DIAGNOSIS — C642 Malignant neoplasm of left kidney, except renal pelvis: Secondary | ICD-10-CM

## 2016-01-08 DIAGNOSIS — C652 Malignant neoplasm of left renal pelvis: Secondary | ICD-10-CM | POA: Diagnosis not present

## 2016-01-08 DIAGNOSIS — C659 Malignant neoplasm of unspecified renal pelvis: Secondary | ICD-10-CM

## 2016-01-08 LAB — CBC WITH DIFFERENTIAL/PLATELET
BASO%: 0.3 % (ref 0.0–2.0)
Basophils Absolute: 0 10*3/uL (ref 0.0–0.1)
EOS ABS: 0.1 10*3/uL (ref 0.0–0.5)
EOS%: 1.1 % (ref 0.0–7.0)
HEMATOCRIT: 27.3 % — AB (ref 38.4–49.9)
HEMOGLOBIN: 9.1 g/dL — AB (ref 13.0–17.1)
LYMPH#: 2.6 10*3/uL (ref 0.9–3.3)
LYMPH%: 46.3 % (ref 14.0–49.0)
MCH: 27.8 pg (ref 27.2–33.4)
MCHC: 33.4 g/dL (ref 32.0–36.0)
MCV: 83.3 fL (ref 79.3–98.0)
MONO#: 0.4 10*3/uL (ref 0.1–0.9)
MONO%: 6.8 % (ref 0.0–14.0)
NEUT%: 45.5 % (ref 39.0–75.0)
NEUTROS ABS: 2.6 10*3/uL (ref 1.5–6.5)
PLATELETS: 354 10*3/uL (ref 140–400)
RBC: 3.28 10*6/uL — ABNORMAL LOW (ref 4.20–5.82)
RDW: 19.4 % — AB (ref 11.0–14.6)
WBC: 5.6 10*3/uL (ref 4.0–10.3)

## 2016-01-08 LAB — COMPREHENSIVE METABOLIC PANEL
ALBUMIN: 2.9 g/dL — AB (ref 3.5–5.0)
ALK PHOS: 119 U/L (ref 40–150)
ALT: 17 U/L (ref 0–55)
ANION GAP: 10 meq/L (ref 3–11)
AST: 13 U/L (ref 5–34)
BILIRUBIN TOTAL: 0.32 mg/dL (ref 0.20–1.20)
BUN: 12.6 mg/dL (ref 7.0–26.0)
CO2: 23 mEq/L (ref 22–29)
CREATININE: 1.2 mg/dL (ref 0.7–1.3)
Calcium: 9.4 mg/dL (ref 8.4–10.4)
Chloride: 109 mEq/L (ref 98–109)
EGFR: 77 mL/min/{1.73_m2} — AB (ref >90)
GLUCOSE: 103 mg/dL (ref 70–140)
Potassium: 4.2 mEq/L (ref 3.5–5.1)
Sodium: 142 mEq/L (ref 136–145)
TOTAL PROTEIN: 7.3 g/dL (ref 6.4–8.3)

## 2016-01-08 MED ORDER — SODIUM CHLORIDE 0.9% FLUSH
10.0000 mL | INTRAVENOUS | Status: DC | PRN
  Administered 2016-01-08: 10 mL
  Filled 2016-01-08: qty 10

## 2016-01-08 MED ORDER — PROCHLORPERAZINE MALEATE 10 MG PO TABS
10.0000 mg | ORAL_TABLET | Freq: Once | ORAL | Status: AC
  Administered 2016-01-08: 10 mg via ORAL

## 2016-01-08 MED ORDER — SODIUM CHLORIDE 0.9 % IV SOLN
Freq: Once | INTRAVENOUS | Status: AC
  Administered 2016-01-08: 09:00:00 via INTRAVENOUS

## 2016-01-08 MED ORDER — SODIUM CHLORIDE 0.9 % IV SOLN
1000.0000 mg/m2 | Freq: Once | INTRAVENOUS | Status: AC
  Administered 2016-01-08: 2090 mg via INTRAVENOUS
  Filled 2016-01-08: qty 52.63

## 2016-01-08 MED ORDER — HEPARIN SOD (PORK) LOCK FLUSH 100 UNIT/ML IV SOLN
500.0000 [IU] | Freq: Once | INTRAVENOUS | Status: AC | PRN
  Administered 2016-01-08: 500 [IU]
  Filled 2016-01-08: qty 5

## 2016-01-08 MED ORDER — PROCHLORPERAZINE MALEATE 10 MG PO TABS
ORAL_TABLET | ORAL | Status: AC
  Filled 2016-01-08: qty 1

## 2016-01-08 NOTE — Patient Instructions (Signed)
[facility] Discharge Instructions for Patients Receiving Chemotherapy  Today you received the following chemotherapy agents Gemzar  To help prevent nausea and vomiting after your treatment, we encourage you to take your nausea medication as directed.    If you develop nausea and vomiting that is not controlled by your nausea medication, call the clinic.   BELOW ARE SYMPTOMS THAT SHOULD BE REPORTED IMMEDIATELY:  *FEVER GREATER THAN 100.5 F  *CHILLS WITH OR WITHOUT FEVER  NAUSEA AND VOMITING THAT IS NOT CONTROLLED WITH YOUR NAUSEA MEDICATION  *UNUSUAL SHORTNESS OF BREATH  *UNUSUAL BRUISING OR BLEEDING  TENDERNESS IN MOUTH AND THROAT WITH OR WITHOUT PRESENCE OF ULCERS  *URINARY PROBLEMS  *BOWEL PROBLEMS  UNUSUAL RASH Items with * indicate a potential emergency and should be followed up as soon as possible.  Feel free to call the clinic you have any questions or concerns. The clinic phone number is [phone].  Please show the CHEMO ALERT CARD at check-in to the Emergency Department and triage nurse.   

## 2016-01-22 ENCOUNTER — Ambulatory Visit (HOSPITAL_BASED_OUTPATIENT_CLINIC_OR_DEPARTMENT_OTHER): Payer: Medicaid Other

## 2016-01-22 ENCOUNTER — Other Ambulatory Visit (HOSPITAL_BASED_OUTPATIENT_CLINIC_OR_DEPARTMENT_OTHER): Payer: Medicaid Other

## 2016-01-22 ENCOUNTER — Telehealth: Payer: Self-pay | Admitting: Medical

## 2016-01-22 ENCOUNTER — Encounter (HOSPITAL_COMMUNITY): Payer: Medicaid Other

## 2016-01-22 ENCOUNTER — Ambulatory Visit (HOSPITAL_BASED_OUTPATIENT_CLINIC_OR_DEPARTMENT_OTHER): Payer: Medicaid Other | Admitting: Oncology

## 2016-01-22 ENCOUNTER — Telehealth: Payer: Self-pay | Admitting: Oncology

## 2016-01-22 ENCOUNTER — Ambulatory Visit: Payer: Medicaid Other

## 2016-01-22 ENCOUNTER — Telehealth: Payer: Self-pay | Admitting: *Deleted

## 2016-01-22 ENCOUNTER — Ambulatory Visit (HOSPITAL_COMMUNITY)
Admission: RE | Admit: 2016-01-22 | Discharge: 2016-01-22 | Disposition: A | Discharge status: Home / Self Care | Payer: Medicaid Other | Source: Ambulatory Visit | Attending: Oncology | Admitting: Oncology

## 2016-01-22 VITALS — BP 145/85 | HR 70 | Temp 97.8°F | Resp 16 | Ht 69.0 in | Wt 194.7 lb

## 2016-01-22 VITALS — BP 127/70 | HR 70 | Temp 98.3°F | Resp 17

## 2016-01-22 DIAGNOSIS — C7800 Secondary malignant neoplasm of unspecified lung: Secondary | ICD-10-CM

## 2016-01-22 DIAGNOSIS — Z5111 Encounter for antineoplastic chemotherapy: Secondary | ICD-10-CM

## 2016-01-22 DIAGNOSIS — C652 Malignant neoplasm of left renal pelvis: Secondary | ICD-10-CM

## 2016-01-22 DIAGNOSIS — D630 Anemia in neoplastic disease: Secondary | ICD-10-CM

## 2016-01-22 DIAGNOSIS — C642 Malignant neoplasm of left kidney, except renal pelvis: Secondary | ICD-10-CM

## 2016-01-22 DIAGNOSIS — C659 Malignant neoplasm of unspecified renal pelvis: Secondary | ICD-10-CM

## 2016-01-22 DIAGNOSIS — Z95828 Presence of other vascular implants and grafts: Secondary | ICD-10-CM

## 2016-01-22 DIAGNOSIS — D649 Anemia, unspecified: Secondary | ICD-10-CM | POA: Insufficient documentation

## 2016-01-22 LAB — CBC WITH DIFFERENTIAL/PLATELET
BASO%: 0.3 % (ref 0.0–2.0)
BASOS ABS: 0 10*3/uL (ref 0.0–0.1)
EOS%: 1.7 % (ref 0.0–7.0)
Eosinophils Absolute: 0.1 10*3/uL (ref 0.0–0.5)
HEMATOCRIT: 26.2 % — AB (ref 38.4–49.9)
HEMOGLOBIN: 8.3 g/dL — AB (ref 13.0–17.1)
LYMPH#: 1.9 10*3/uL (ref 0.9–3.3)
LYMPH%: 33.6 % (ref 14.0–49.0)
MCH: 28.6 pg (ref 27.2–33.4)
MCHC: 31.7 g/dL — ABNORMAL LOW (ref 32.0–36.0)
MCV: 90.3 fL (ref 79.3–98.0)
MONO#: 1.3 10*3/uL — AB (ref 0.1–0.9)
MONO%: 23.1 % — ABNORMAL HIGH (ref 0.0–14.0)
NEUT#: 2.4 10*3/uL (ref 1.5–6.5)
NEUT%: 41.3 % (ref 39.0–75.0)
Platelets: 318 10*3/uL (ref 140–400)
RBC: 2.9 10*6/uL — ABNORMAL LOW (ref 4.20–5.82)
RDW: 22.7 % — ABNORMAL HIGH (ref 11.0–14.6)
WBC: 5.8 10*3/uL (ref 4.0–10.3)
nRBC: 0 % (ref 0–0)

## 2016-01-22 LAB — COMPREHENSIVE METABOLIC PANEL
ALBUMIN: 3 g/dL — AB (ref 3.5–5.0)
ALK PHOS: 101 U/L (ref 40–150)
ALT: 15 U/L (ref 0–55)
AST: 13 U/L (ref 5–34)
Anion Gap: 10 mEq/L (ref 3–11)
BUN: 13.1 mg/dL (ref 7.0–26.0)
CALCIUM: 8.8 mg/dL (ref 8.4–10.4)
CO2: 23 mEq/L (ref 22–29)
Chloride: 109 mEq/L (ref 98–109)
Creatinine: 1.2 mg/dL (ref 0.7–1.3)
EGFR: 79 mL/min/{1.73_m2} — AB (ref >90)
Glucose: 104 mg/dl (ref 70–140)
POTASSIUM: 3.8 meq/L (ref 3.5–5.1)
Sodium: 142 mEq/L (ref 136–145)
Total Bilirubin: 0.3 mg/dL (ref 0.20–1.20)
Total Protein: 6.7 g/dL (ref 6.4–8.3)

## 2016-01-22 LAB — PREPARE RBC (CROSSMATCH)

## 2016-01-22 MED ORDER — DEXAMETHASONE SODIUM PHOSPHATE 10 MG/ML IJ SOLN
10.0000 mg | Freq: Once | INTRAMUSCULAR | Status: AC
  Administered 2016-01-22: 10 mg via INTRAVENOUS

## 2016-01-22 MED ORDER — SODIUM CHLORIDE 0.9% FLUSH
10.0000 mL | INTRAVENOUS | Status: DC | PRN
Start: 2016-01-22 — End: 2016-01-22
  Administered 2016-01-22: 10 mL via INTRAVENOUS
  Filled 2016-01-22: qty 10

## 2016-01-22 MED ORDER — HEPARIN SOD (PORK) LOCK FLUSH 100 UNIT/ML IV SOLN
500.0000 [IU] | Freq: Once | INTRAVENOUS | Status: AC | PRN
Start: 2016-01-22 — End: 2016-01-22
  Administered 2016-01-22: 500 [IU]
  Filled 2016-01-22: qty 5

## 2016-01-22 MED ORDER — SODIUM CHLORIDE 0.9 % IV SOLN
Freq: Once | INTRAVENOUS | Status: AC
  Administered 2016-01-22: 09:00:00 via INTRAVENOUS

## 2016-01-22 MED ORDER — PALONOSETRON HCL INJECTION 0.25 MG/5ML
INTRAVENOUS | Status: AC
  Filled 2016-01-22: qty 5

## 2016-01-22 MED ORDER — SODIUM CHLORIDE 0.9 % IV SOLN: 250.0000 mL | Freq: Once | INTRAVENOUS | Status: AC

## 2016-01-22 MED ORDER — DIPHENHYDRAMINE HCL 25 MG PO CAPS
25.0000 mg | ORAL_CAPSULE | Freq: Once | ORAL | Status: AC
  Administered 2016-01-22: 25 mg via ORAL

## 2016-01-22 MED ORDER — DIPHENHYDRAMINE HCL 25 MG PO CAPS
ORAL_CAPSULE | ORAL | Status: AC
  Filled 2016-01-22: qty 1

## 2016-01-22 MED ORDER — SODIUM CHLORIDE 0.9 % IV SOLN
1000.0000 mg/m2 | Freq: Once | INTRAVENOUS | Status: AC
  Administered 2016-01-22: 2090 mg via INTRAVENOUS
  Filled 2016-01-22: qty 52.63

## 2016-01-22 MED ORDER — DEXAMETHASONE SODIUM PHOSPHATE 10 MG/ML IJ SOLN
INTRAMUSCULAR | Status: AC
  Filled 2016-01-22: qty 1

## 2016-01-22 MED ORDER — SODIUM CHLORIDE 0.9 % IV SOLN
470.0000 mg | Freq: Once | INTRAVENOUS | Status: AC
  Administered 2016-01-22: 470 mg via INTRAVENOUS
  Filled 2016-01-22: qty 47

## 2016-01-22 MED ORDER — ACETAMINOPHEN 325 MG PO TABS
ORAL_TABLET | ORAL | Status: AC
  Filled 2016-01-22: qty 2

## 2016-01-22 MED ORDER — ACETAMINOPHEN 325 MG PO TABS
650.0000 mg | ORAL_TABLET | Freq: Once | ORAL | Status: AC
  Administered 2016-01-22: 650 mg via ORAL

## 2016-01-22 MED ORDER — SODIUM CHLORIDE 0.9% FLUSH
10.0000 mL | INTRAVENOUS | Status: DC | PRN
  Administered 2016-01-22: 10 mL
  Filled 2016-01-22: qty 10

## 2016-01-22 MED ORDER — PALONOSETRON HCL INJECTION 0.25 MG/5ML
0.2500 mg | Freq: Once | INTRAVENOUS | Status: AC
  Administered 2016-01-22: 0.25 mg via INTRAVENOUS

## 2016-01-22 NOTE — Telephone Encounter (Signed)
Call and see how his blood sugars have been running?  Any lows under 70?    Any diabetes concerns at the moment  FYI - he is under treatment for cancer currently

## 2016-01-22 NOTE — Patient Instructions (Addendum)
[place] Discharge Instructions for Patients Receiving Chemotherapy  Today you received the following chemotherapy agents:  Gemzar and Carboplatin. To help prevent nausea and vomiting after your treatment, we encourage you to take your nausea medication as directed.   If you develop nausea and vomiting that is not controlled by your nausea medication, call the clinic.   BELOW ARE SYMPTOMS THAT SHOULD BE REPORTED IMMEDIATELY:  *FEVER GREATER THAN 100.5 F  *CHILLS WITH OR WITHOUT FEVER  NAUSEA AND VOMITING THAT IS NOT CONTROLLED WITH YOUR NAUSEA MEDICATION  *UNUSUAL SHORTNESS OF BREATH  *UNUSUAL BRUISING OR BLEEDING  TENDERNESS IN MOUTH AND THROAT WITH OR WITHOUT PRESENCE OF ULCERS  *URINARY PROBLEMS  *BOWEL PROBLEMS  UNUSUAL RASH Items with * indicate a potential emergency and should be followed up as soon as possible.  Feel free to call the clinic you have any questions or concerns. The clinic phone number is [phone].  Please show the [place] at check-in to the Emergency Department and triage nurse.   Blood Transfusion , Adult A blood transfusion is a procedure in which you receive donated blood, including plasma, platelets, and red blood cells, through an IV tube. You may need a blood transfusion because of illness, surgery, or injury. The blood may come from a donor. You may also be able to donate blood for yourself (autologous blood donation) before a surgery if you know that you might require a blood transfusion. The blood given in a transfusion is made up of different types of cells. You may receive:  Red blood cells. These carry oxygen to the cells in the body.  White blood cells. These help you fight infections.  Platelets. These help your blood to clot.  Plasma. This is the liquid part of your blood and it helps with fluid imbalances. If you have hemophilia or another clotting disorder, you may also receive other types of blood  products. Tell a health care provider about:  Any allergies you have.  All medicines you are taking, including vitamins, herbs, eye drops, creams, and over-the-counter medicines.  Any problems you or family members have had with anesthetic medicines.  Any blood disorders you have.  Any surgeries you have had.  Any medical conditions you have, including any recent fever or cold symptoms.  Whether you are pregnant or may be pregnant.  Any previous reactions you have had during a blood transfusion. What are the risks? Generally, this is a safe procedure. However, problems may occur, including:  Having an allergic reaction to something in the donated blood. Hives and itching may be symptoms of this type of reaction.  Fever. This may be a reaction to the white blood cells in the transfused blood. Nausea or chest pain may accompany a fever.  Iron overload. This can happen from having many transfusions.  Transfusion-related acute lung injury (TRALI). This is a rare reaction that causes lung damage. The cause is not known.TRALI can occur within hours of a transfusion or several days later.  Sudden (acute) or delayed hemolytic reactions. This happens if your blood does not match the cells in your transfusion. Your body's defense system (immune system) may try to attack the new cells. This complication is rare. The symptoms include fever, chills, nausea, and low back pain or chest pain.  Infection or disease transmission. This is rare. What happens before the procedure?  You will have a blood test to determine your blood type. This is necessary to know what kind of  blood your body will accept and to match it to the donor blood.  If you are going to have a planned surgery, you may be able to do an autologous blood donation. This may be done in case you need to have a transfusion.  If you have had an allergic reaction to a transfusion in the past, you may be given medicine to help prevent  a reaction. This medicine may be given to you by mouth or through an IV tube.  You will have your temperature, blood pressure, and pulse monitored before the transfusion.  Follow instructions from your health care provider about eating and drinking restrictions.  Ask your health care provider about:  Changing or stopping your regular medicines. This is especially important if you are taking diabetes medicines or blood thinners.  Taking medicines such as aspirin and ibuprofen. These medicines can thin your blood. Do not take these medicines before your procedure if your health care provider instructs you not to. What happens during the procedure?  An IV tube will be inserted into one of your veins.  The bag of donated blood will be attached to your IV tube. The blood will then enter through your vein.  Your temperature, blood pressure, and pulse will be monitored regularly during the transfusion. This monitoring is done to detect early signs of a transfusion reaction.  If you have any signs or symptoms of a reaction, your transfusion will be stopped and you may be given medicine.  When the transfusion is complete, your IV tube will be removed.  Pressure may be applied to the IV site for a few minutes.  A bandage (dressing) will be applied. The procedure may vary among health care providers and hospitals. What happens after the procedure?  Your temperature, blood pressure, heart rate, breathing rate, and blood oxygen level will be monitored often.  Your blood may be tested to see how you are responding to the transfusion.  You may be warmed with fluids or blankets to maintain a normal body temperature. Summary  A blood transfusion is a procedure in which you receive donated blood, including plasma, platelets, and red blood cells, through an IV tube.  Your temperature, blood pressure, and pulse will be monitored before, during, and after the transfusion.  Your blood may be tested  after the transfusion to see how your body has responded. This information is not intended to replace advice given to you by your health care provider. Make sure you discuss any questions you have with your health care provider. Document Released: 02/01/2000 Document Revised: 11/01/2015 Document Reviewed: 11/01/2015 Elsevier Interactive Patient Education  2017 [employer].

## 2016-01-22 NOTE — Telephone Encounter (Signed)
Ok, I was just checking in

## 2016-01-22 NOTE — Telephone Encounter (Signed)
Called he said that his blood sugar reading a ranging around 90-140. He said that he doesn't have any concerns  With his b/s .

## 2016-01-22 NOTE — Telephone Encounter (Signed)
Okay 

## 2016-01-22 NOTE — Telephone Encounter (Signed)
Per LOS I have scheduled and notified the scheduler

## 2016-01-22 NOTE — Telephone Encounter (Signed)
Message sent to Infusion scheduler to be added, per 01/22/16 los. Appointments scheduled per, 01/22/16 los. A copy of the AVS report and appointment schedule was given to the patient, per 01/22/16 los.

## 2016-01-22 NOTE — Progress Notes (Signed)
Hematology and Oncology Follow Up Visit  David [name] [id number] Dec 02, 1960 55 y.o. 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 He presented with a 5 cm mass in the central left kidney as well as pulmonary metastasis, nodules and pleural effusion.   Prior Therapy: Status post biopsy obtained on 10/12/2015 which confirmed the presence of carcinoma. 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  Current therapy: Chemotherapy with gemcitabine and carboplatin cycle one  on 10/30/2015. He is here on day 1 of cycle 5 of therapy.  Interim History: David [name] presents today for a follow-up visit. Since the last visit, he reports no changes in his health. He is doing much better and continues to have excellent appetite and performance status. He is gaining more weight and his activities have continuously improved. He tolerated chemotherapy without any new complications. He is no longer reporting any fevers or chills. He is not reporting any dyspnea on exertion. He is not reporting any hematochezia or melena. He denied any  nausea, vomiting or infusion related issues. He does report urinary frequency but no hematuria or dysuria. He denies any respiratory symptoms of cough or dyspnea on exertion. His performance status continues to be dramatically improved.   He denied any headaches, blurry vision, syncope or seizures. He does report fevers but no chills.He does not report any chest pain, palpitation, orthopnea, leg edema or dyspnea on exertion. He denied any cough, wheezing or hemoptysis. He does not report any nausea, vomiting or early satiety. He did report constipation which have resolved. He denied any diarrhea. He does not report any frequency, urgency or hesitancy. He does not report any skeletal complaints. Remaining review of system is unremarkable.   Medications: I have  reviewed the patient's current medications.  Current Outpatient Prescriptions  Medication Sig Dispense Refill  . carvedilol (COREG) 3.125 MG tablet Take 1 tablet (3.125 mg total) by mouth 2 (two) times daily with a meal. 180 tablet 0  . chlorproMAZINE (THORAZINE) 50 MG tablet Take 1 tablet (50 mg total) by mouth 3 (three) times daily as needed for hiccoughs. 90 tablet 0  . clonazePAM (KLONOPIN) 0.5 MG tablet TAKE 1 TABLET BY MOUTH 1-2 TIMES DAILY AS NEEDED FOR ANXIETY. MAY CAUSE SEDATION 15 tablet 0  . cyanocobalamin (,VITAMIN B-12,) 1000 MCG/ML injection Please take intramuscular once weekly for 4 weeks, then change to once monthly after that 1 mL 12  . ferrous sulfate 325 (65 FE) MG tablet Take 1 tablet (325 mg total) by mouth 2 (two) times daily with a meal. 60 tablet 3  . glipiZIDE (GLUCOTROL) 5 MG tablet Take 1 tablet (5 mg total) by mouth daily before breakfast. 30 tablet 2  . HYDROcodone-acetaminophen (NORCO/VICODIN) 5-325 MG tablet Take 1 tablet by mouth every 6 (six) hours as needed for moderate pain. 15 tablet 0  . lidocaine-prilocaine (EMLA) cream Apply 1 application topically as needed. 30 g 0  . megestrol (MEGACE) 400 MG/10ML suspension Take 10 mLs (400 mg total) by mouth 2 (two) times daily. 240 mL 5  . metFORMIN (GLUCOPHAGE) 500 MG tablet Take 1 tablet (500 mg total) by mouth daily with breakfast. 90 tablet 1  . pantoprazole (PROTONIX) 40 MG tablet Take 1 tablet (40 mg total) by mouth daily at 6 (six) AM. 30 tablet 3  . prochlorperazine (COMPAZINE) 10 MG tablet Take 1 tablet (10 mg total) by mouth every 6 (six) hours as  needed for nausea or vomiting. 30 tablet 1   No current facility-administered medications for this visit.      Allergies: No Known Allergies  Past Medical History, Surgical history, Social history, and Family History were reviewed and updated.   Physical Exam: Blood pressure (!) 145/85, pulse 70, temperature 97.8 F (36.6 C), temperature source Oral, resp.  rate 16, height 5\' 9"  (1.753 m), weight 194 lb 11.2 oz (88.3 kg), SpO2 100 %. ECOG: 1 General appearance: Well-appearing gentleman without distress. Head: Normocephalic, without obvious abnormality no oral thrush noted. Neck: no adenopathy Lymph nodes: Cervical, supraclavicular, and axillary nodes normal. Heart:regular rate and rhythm, S1, S2 normal, no murmur, click, rub or gallop Lung:chest clear, no wheezing, rales, normal symmetric air entry Abdomin: soft, non-tender, without masses or organomegaly no rebound or guarding. EXT:no erythema, induration, or nodules   Lab Results: Lab Results  Component Value Date   WBC 5.8 01/22/2016   HGB 8.3 (L) 01/22/2016   HCT 26.2 (L) 01/22/2016   MCV 90.3 01/22/2016   PLT 318 01/22/2016     Chemistry      Component Value Date/Time   NA 142 01/08/2016 0738   K 4.2 01/08/2016 0738   CL 102 11/26/2015 0750   CO2 23 01/08/2016 0738   BUN 12.6 01/08/2016 0738   CREATININE 1.2 01/08/2016 0738      Component Value Date/Time   CALCIUM 9.4 01/08/2016 0738   ALKPHOS 119 01/08/2016 0738   AST 13 01/08/2016 0738   ALT 17 01/08/2016 0738   BILITOT 0.32 01/08/2016 0738        Impression and Plan:  55 year old gentleman with the following issues:  1. 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 He is status post biopsy of a pleural-based nodule which confirmed the presence of carcinoma although further immunohistochemical staining was not performed because of lack of tissue.  These findings confirm the presence of advanced malignancy likely of the genitourinary tract.   He is currently receiving salvage chemotherapy with carboplatin and gemcitabine. He completed 4 cycles of therapy without any major complications. He did note significant clinical improvement since been on chemotherapy.  CT scan on 12/27/2015 showed a positive response.  The plan is to continue with the  same dose and schedule for a total of 6 cycles. He will start cycle 5 today without any reduction or delay.  2. IV access: Port-A-Cath has been utilized without complications.  3. Nausea prophylaxis: He has prescription for antiemetics available to him. No nausea or vomiting issues noted.  4. Fevers, chills and sweats: This have resolved at this time.  5. Weight loss: Weight continues to improve. He gained more weight since the last visit.  6. Anemia: Related to malignancy and chemotherapy infusion. We will arrange periodic transfusion as supportive measures while he is on chemotherapy. His hemoglobin is 8.3 and will require transfusion for the next treatment.  7. Follow-up: Will be in one week for day 8 of cycle 5 of chemotherapy.  [facility], MD 12/5/20178:45 AM

## 2016-01-23 LAB — TYPE AND SCREEN
ABO/RH(D): B POS
ANTIBODY SCREEN: NEGATIVE
Unit division: 0
Unit division: 0

## 2016-01-29 ENCOUNTER — Telehealth: Payer: Self-pay | Admitting: Medical

## 2016-01-29 ENCOUNTER — Ambulatory Visit: Payer: Self-pay

## 2016-01-29 ENCOUNTER — Other Ambulatory Visit: Payer: Self-pay | Admitting: Medical

## 2016-01-29 ENCOUNTER — Ambulatory Visit (HOSPITAL_BASED_OUTPATIENT_CLINIC_OR_DEPARTMENT_OTHER): Payer: Medicaid Other

## 2016-01-29 ENCOUNTER — Other Ambulatory Visit (HOSPITAL_BASED_OUTPATIENT_CLINIC_OR_DEPARTMENT_OTHER): Payer: Medicaid Other

## 2016-01-29 VITALS — BP 132/89 | HR 73 | Temp 98.2°F | Resp 18

## 2016-01-29 DIAGNOSIS — C642 Malignant neoplasm of left kidney, except renal pelvis: Secondary | ICD-10-CM | POA: Diagnosis not present

## 2016-01-29 DIAGNOSIS — Z5111 Encounter for antineoplastic chemotherapy: Secondary | ICD-10-CM | POA: Diagnosis present

## 2016-01-29 DIAGNOSIS — C659 Malignant neoplasm of unspecified renal pelvis: Secondary | ICD-10-CM

## 2016-01-29 DIAGNOSIS — Z95828 Presence of other vascular implants and grafts: Secondary | ICD-10-CM

## 2016-01-29 LAB — COMPREHENSIVE METABOLIC PANEL
ALBUMIN: 3.3 g/dL — AB (ref 3.5–5.0)
ALK PHOS: 106 U/L (ref 40–150)
ALT: 30 U/L (ref 0–55)
ANION GAP: 9 meq/L (ref 3–11)
AST: 19 U/L (ref 5–34)
BILIRUBIN TOTAL: 0.39 mg/dL (ref 0.20–1.20)
BUN: 13.7 mg/dL (ref 7.0–26.0)
CO2: 23 meq/L (ref 22–29)
CREATININE: 1.1 mg/dL (ref 0.7–1.3)
Calcium: 9.2 mg/dL (ref 8.4–10.4)
Chloride: 107 mEq/L (ref 98–109)
Glucose: 82 mg/dl (ref 70–140)
Potassium: 4.1 mEq/L (ref 3.5–5.1)
Sodium: 139 mEq/L (ref 136–145)
TOTAL PROTEIN: 7.3 g/dL (ref 6.4–8.3)

## 2016-01-29 LAB — CBC WITH DIFFERENTIAL/PLATELET
BASO%: 2.9 % — AB (ref 0.0–2.0)
Basophils Absolute: 0.1 10*3/uL (ref 0.0–0.1)
EOS ABS: 0 10*3/uL (ref 0.0–0.5)
EOS%: 0.9 % (ref 0.0–7.0)
HEMATOCRIT: 33.6 % — AB (ref 38.4–49.9)
HEMOGLOBIN: 10.8 g/dL — AB (ref 13.0–17.1)
LYMPH#: 1.9 10*3/uL (ref 0.9–3.3)
LYMPH%: 59.1 % — AB (ref 14.0–49.0)
MCH: 28.7 pg (ref 27.2–33.4)
MCHC: 32.1 g/dL (ref 32.0–36.0)
MCV: 89.3 fL (ref 79.3–98.0)
MONO#: 0.1 10*3/uL (ref 0.1–0.9)
MONO%: 3.7 % (ref 0.0–14.0)
NEUT%: 33.4 % — ABNORMAL LOW (ref 39.0–75.0)
NEUTROS ABS: 1.1 10*3/uL — AB (ref 1.5–6.5)
PLATELETS: 294 10*3/uL (ref 140–400)
RBC: 3.76 10*6/uL — AB (ref 4.20–5.82)
RDW: 20.5 % — AB (ref 11.0–14.6)
WBC: 3.2 10*3/uL — AB (ref 4.0–10.3)

## 2016-01-29 MED ORDER — METFORMIN HCL 500 MG PO TABS: 500.0000 mg | ORAL_TABLET | Freq: Two times a day (BID) | ORAL | 1 refills | Status: DC

## 2016-01-29 MED ORDER — SODIUM CHLORIDE 0.9% FLUSH
10.0000 mL | INTRAVENOUS | Status: DC | PRN
Start: 2016-01-29 — End: 2016-01-29
  Administered 2016-01-29: 10 mL
  Filled 2016-01-29: qty 10

## 2016-01-29 MED ORDER — ALTEPLASE 2 MG IJ SOLR
2.0000 mg | Freq: Once | INTRAMUSCULAR | Status: DC | PRN
  Filled 2016-01-29: qty 2

## 2016-01-29 MED ORDER — SODIUM CHLORIDE 0.9% FLUSH
10.0000 mL | INTRAVENOUS | Status: DC | PRN
  Administered 2016-01-29: 10 mL via INTRAVENOUS
  Filled 2016-01-29: qty 10

## 2016-01-29 MED ORDER — HEPARIN SOD (PORK) LOCK FLUSH 100 UNIT/ML IV SOLN
250.0000 [IU] | Freq: Once | INTRAVENOUS | Status: DC | PRN
Start: 2016-01-29 — End: 2016-01-29
  Filled 2016-01-29: qty 5

## 2016-01-29 MED ORDER — HEPARIN SOD (PORK) LOCK FLUSH 100 UNIT/ML IV SOLN
500.0000 [IU] | Freq: Once | INTRAVENOUS | Status: AC | PRN
Start: 2016-01-29 — End: 2016-01-29
  Administered 2016-01-29: 500 [IU]
  Filled 2016-01-29: qty 5

## 2016-01-29 MED ORDER — SODIUM CHLORIDE 0.9% FLUSH
3.0000 mL | INTRAVENOUS | Status: DC | PRN
  Filled 2016-01-29: qty 10

## 2016-01-29 MED ORDER — PROCHLORPERAZINE MALEATE 10 MG PO TABS
ORAL_TABLET | ORAL | Status: AC
  Filled 2016-01-29: qty 1

## 2016-01-29 MED ORDER — SODIUM CHLORIDE 0.9 % IV SOLN
Freq: Once | INTRAVENOUS | Status: AC
  Administered 2016-01-29: 10:00:00 via INTRAVENOUS

## 2016-01-29 MED ORDER — SODIUM CHLORIDE 0.9 % IV SOLN
1000.0000 mg/m2 | Freq: Once | INTRAVENOUS | Status: AC
  Administered 2016-01-29: 2090 mg via INTRAVENOUS
  Filled 2016-01-29: qty 52.63

## 2016-01-29 MED ORDER — PROCHLORPERAZINE MALEATE 10 MG PO TABS
10.0000 mg | ORAL_TABLET | Freq: Once | ORAL | Status: AC
  Administered 2016-01-29: 10 mg via ORAL

## 2016-01-29 NOTE — Telephone Encounter (Signed)
Pt's wife, Quincy Carnes, called stating that pt need to get refill on Metformin at [facility] [place] however pharmacy says it is too early to get refill. Wife says this is because pt has been taking 2 pills a day as instructed by Audelia Acton. Need instructions changed so he can get refill at [facility]

## 2016-01-29 NOTE — Progress Notes (Signed)
Ok to treat with ANC 1.1 per Dr. Alen Blew for treatment day 01/29/2016

## 2016-01-29 NOTE — Patient Instructions (Signed)
[facility] Discharge Instructions for Patients Receiving Chemotherapy  Today you received the following chemotherapy agents Gemzar  To help prevent nausea and vomiting after your treatment, we encourage you to take your nausea medication as directed.    If you develop nausea and vomiting that is not controlled by your nausea medication, call the clinic.   BELOW ARE SYMPTOMS THAT SHOULD BE REPORTED IMMEDIATELY:  *FEVER GREATER THAN 100.5 F  *CHILLS WITH OR WITHOUT FEVER  NAUSEA AND VOMITING THAT IS NOT CONTROLLED WITH YOUR NAUSEA MEDICATION  *UNUSUAL SHORTNESS OF BREATH  *UNUSUAL BRUISING OR BLEEDING  TENDERNESS IN MOUTH AND THROAT WITH OR WITHOUT PRESENCE OF ULCERS  *URINARY PROBLEMS  *BOWEL PROBLEMS  UNUSUAL RASH Items with * indicate a potential emergency and should be followed up as soon as possible.  Feel free to call the clinic you have any questions or concerns. The clinic phone number is [phone].  Please show the CHEMO ALERT CARD at check-in to the Emergency Department and triage nurse.   

## 2016-01-29 NOTE — Patient Instructions (Signed)

## 2016-02-04 ENCOUNTER — Telehealth: Payer: Self-pay | Admitting: *Deleted

## 2016-02-04 NOTE — Telephone Encounter (Signed)
Wife calling to say patient has a raw bottom from being constipated. Scalp is itching from his hair thinning. Is taking benadryl. Tried to call wife  X 2. Left message to call me. Called patient at home and suggested he get OTC medication for diaper rash, ask pharmacist what he would suggest. Get caladryl lotion for scalp itching and continue taking benadryl PO.

## 2016-02-04 NOTE — Telephone Encounter (Signed)
I agree

## 2016-02-05 ENCOUNTER — Other Ambulatory Visit (HOSPITAL_BASED_OUTPATIENT_CLINIC_OR_DEPARTMENT_OTHER): Payer: Medicaid Other

## 2016-02-05 ENCOUNTER — Ambulatory Visit (HOSPITAL_BASED_OUTPATIENT_CLINIC_OR_DEPARTMENT_OTHER): Payer: Medicaid Other

## 2016-02-05 VITALS — BP 126/91 | HR 65 | Temp 97.7°F | Resp 18

## 2016-02-05 DIAGNOSIS — C642 Malignant neoplasm of left kidney, except renal pelvis: Secondary | ICD-10-CM

## 2016-02-05 DIAGNOSIS — Z95828 Presence of other vascular implants and grafts: Secondary | ICD-10-CM

## 2016-02-05 DIAGNOSIS — Z452 Encounter for adjustment and management of vascular access device: Secondary | ICD-10-CM | POA: Diagnosis not present

## 2016-02-05 DIAGNOSIS — C659 Malignant neoplasm of unspecified renal pelvis: Secondary | ICD-10-CM

## 2016-02-05 LAB — COMPREHENSIVE METABOLIC PANEL
ALBUMIN: 3.3 g/dL — AB (ref 3.5–5.0)
ALK PHOS: 106 U/L (ref 40–150)
ALT: 16 U/L (ref 0–55)
ANION GAP: 9 meq/L (ref 3–11)
AST: 15 U/L (ref 5–34)
BUN: 21.1 mg/dL (ref 7.0–26.0)
CALCIUM: 9.3 mg/dL (ref 8.4–10.4)
CO2: 24 mEq/L (ref 22–29)
CREATININE: 1 mg/dL (ref 0.7–1.3)
Chloride: 108 mEq/L (ref 98–109)
EGFR: >90 mL/min/{1.73_m2} (ref >90)
Glucose: 85 mg/dl (ref 70–140)
POTASSIUM: 4 meq/L (ref 3.5–5.1)
Sodium: 140 mEq/L (ref 136–145)
Total Bilirubin: 0.42 mg/dL (ref 0.20–1.20)
Total Protein: 7.1 g/dL (ref 6.4–8.3)

## 2016-02-05 LAB — CBC WITH DIFFERENTIAL/PLATELET
BASO%: 0.9 % (ref 0.0–2.0)
BASOS ABS: 0 10*3/uL (ref 0.0–0.1)
EOS ABS: 0 10*3/uL (ref 0.0–0.5)
EOS%: 0.3 % (ref 0.0–7.0)
HEMATOCRIT: 29.8 % — AB (ref 38.4–49.9)
HEMOGLOBIN: 9.8 g/dL — AB (ref 13.0–17.1)
LYMPH#: 1.3 10*3/uL (ref 0.9–3.3)
LYMPH%: 61.7 % — ABNORMAL HIGH (ref 14.0–49.0)
MCH: 29.1 pg (ref 27.2–33.4)
MCHC: 32.8 g/dL (ref 32.0–36.0)
MCV: 88.8 fL (ref 79.3–98.0)
MONO#: 0.1 10*3/uL (ref 0.1–0.9)
MONO%: 3 % (ref 0.0–14.0)
NEUT#: 0.7 10*3/uL — ABNORMAL LOW (ref 1.5–6.5)
NEUT%: 34.1 % — AB (ref 39.0–75.0)
PLATELETS: 31 10*3/uL — AB (ref 140–400)
RBC: 3.36 10*6/uL — ABNORMAL LOW (ref 4.20–5.82)
RDW: 18.9 % — AB (ref 11.0–14.6)
WBC: 2.2 10*3/uL — ABNORMAL LOW (ref 4.0–10.3)

## 2016-02-05 MED ORDER — SODIUM CHLORIDE 0.9% FLUSH
10.0000 mL | INTRAVENOUS | Status: DC | PRN
Start: 2016-02-05 — End: 2016-02-05
  Administered 2016-02-05: 10 mL via INTRAVENOUS
  Filled 2016-02-05: qty 10

## 2016-02-05 MED ORDER — HEPARIN SOD (PORK) LOCK FLUSH 100 UNIT/ML IV SOLN
500.0000 [IU] | Freq: Once | INTRAVENOUS | Status: AC | PRN
  Administered 2016-02-05: 500 [IU] via INTRAVENOUS
  Filled 2016-02-05: qty 5

## 2016-02-05 NOTE — Patient Instructions (Signed)
Patient not to receive transfusion today.

## 2016-02-05 NOTE — Progress Notes (Signed)
Per Dr. Alen Blew patient does not need blood transfusion with hemoglobin of 9.8. ANC of 0.7 and platelets of 31 reviewed with Dr. Alen Blew as well. No new orders at this time. Patient stable upon discharge.

## 2016-02-12 ENCOUNTER — Ambulatory Visit (HOSPITAL_BASED_OUTPATIENT_CLINIC_OR_DEPARTMENT_OTHER): Payer: Medicaid Other | Admitting: Oncology

## 2016-02-12 ENCOUNTER — Telehealth: Payer: Self-pay | Admitting: *Deleted

## 2016-02-12 ENCOUNTER — Ambulatory Visit (HOSPITAL_BASED_OUTPATIENT_CLINIC_OR_DEPARTMENT_OTHER): Payer: Medicaid Other

## 2016-02-12 ENCOUNTER — Telehealth: Payer: Self-pay | Admitting: Internal Medicine

## 2016-02-12 ENCOUNTER — Ambulatory Visit: Payer: Medicaid Other

## 2016-02-12 ENCOUNTER — Other Ambulatory Visit (HOSPITAL_BASED_OUTPATIENT_CLINIC_OR_DEPARTMENT_OTHER): Payer: Medicaid Other

## 2016-02-12 VITALS — BP 137/70 | HR 71 | Temp 98.2°F | Resp 18 | Wt 204.6 lb

## 2016-02-12 DIAGNOSIS — C652 Malignant neoplasm of left renal pelvis: Secondary | ICD-10-CM

## 2016-02-12 DIAGNOSIS — C642 Malignant neoplasm of left kidney, except renal pelvis: Secondary | ICD-10-CM

## 2016-02-12 DIAGNOSIS — Z95828 Presence of other vascular implants and grafts: Secondary | ICD-10-CM

## 2016-02-12 DIAGNOSIS — C659 Malignant neoplasm of unspecified renal pelvis: Secondary | ICD-10-CM

## 2016-02-12 DIAGNOSIS — D630 Anemia in neoplastic disease: Secondary | ICD-10-CM | POA: Diagnosis not present

## 2016-02-12 DIAGNOSIS — C7800 Secondary malignant neoplasm of unspecified lung: Secondary | ICD-10-CM | POA: Diagnosis not present

## 2016-02-12 DIAGNOSIS — Z5111 Encounter for antineoplastic chemotherapy: Secondary | ICD-10-CM

## 2016-02-12 LAB — CBC WITH DIFFERENTIAL/PLATELET
BASO%: 0.3 % (ref 0.0–2.0)
Basophils Absolute: 0 10*3/uL (ref 0.0–0.1)
EOS%: 1.6 % (ref 0.0–7.0)
Eosinophils Absolute: 0.1 10*3/uL (ref 0.0–0.5)
HCT: 26.9 % — ABNORMAL LOW (ref 38.4–49.9)
HGB: 9 g/dL — ABNORMAL LOW (ref 13.0–17.1)
LYMPH%: 38.5 % (ref 14.0–49.0)
MCH: 30.8 pg (ref 27.2–33.4)
MCHC: 33.6 g/dL (ref 32.0–36.0)
MCV: 91.6 fL (ref 79.3–98.0)
MONO#: 0.9 10*3/uL (ref 0.1–0.9)
MONO%: 22.3 % — ABNORMAL HIGH (ref 0.0–14.0)
NEUT#: 1.6 10*3/uL (ref 1.5–6.5)
NEUT%: 37.3 % — AB (ref 39.0–75.0)
PLATELETS: 203 10*3/uL (ref 140–400)
RBC: 2.93 10*6/uL — AB (ref 4.20–5.82)
RDW: 21.2 % — ABNORMAL HIGH (ref 11.0–14.6)
WBC: 4.2 10*3/uL (ref 4.0–10.3)
lymph#: 1.6 10*3/uL (ref 0.9–3.3)

## 2016-02-12 LAB — COMPREHENSIVE METABOLIC PANEL
ALT: 17 U/L (ref 0–55)
ANION GAP: 8 meq/L (ref 3–11)
AST: 16 U/L (ref 5–34)
Albumin: 3.3 g/dL — ABNORMAL LOW (ref 3.5–5.0)
Alkaline Phosphatase: 88 U/L (ref 40–150)
BUN: 14.2 mg/dL (ref 7.0–26.0)
CHLORIDE: 110 meq/L — AB (ref 98–109)
CO2: 24 meq/L (ref 22–29)
Calcium: 8.7 mg/dL (ref 8.4–10.4)
Creatinine: 1 mg/dL (ref 0.7–1.3)
Glucose: 116 mg/dl (ref 70–140)
Potassium: 3.7 mEq/L (ref 3.5–5.1)
Sodium: 142 mEq/L (ref 136–145)
Total Bilirubin: 0.28 mg/dL (ref 0.20–1.20)
Total Protein: 6.5 g/dL (ref 6.4–8.3)

## 2016-02-12 MED ORDER — DEXAMETHASONE SODIUM PHOSPHATE 10 MG/ML IJ SOLN
INTRAMUSCULAR | Status: AC
  Filled 2016-02-12: qty 1

## 2016-02-12 MED ORDER — HEPARIN SOD (PORK) LOCK FLUSH 100 UNIT/ML IV SOLN
500.0000 [IU] | Freq: Once | INTRAVENOUS | Status: AC | PRN
  Administered 2016-02-12: 500 [IU]
  Filled 2016-02-12: qty 5

## 2016-02-12 MED ORDER — SODIUM CHLORIDE 0.9 % IV SOLN
Freq: Once | INTRAVENOUS | Status: AC
  Administered 2016-02-12: 09:00:00 via INTRAVENOUS

## 2016-02-12 MED ORDER — SODIUM CHLORIDE 0.9% FLUSH
10.0000 mL | INTRAVENOUS | Status: DC | PRN
  Administered 2016-02-12: 10 mL
  Filled 2016-02-12: qty 10

## 2016-02-12 MED ORDER — GEMCITABINE HCL CHEMO INJECTION 1 GM/26.3ML
1000.0000 mg/m2 | Freq: Once | INTRAVENOUS | Status: AC
  Administered 2016-02-12: 2090 mg via INTRAVENOUS
  Filled 2016-02-12: qty 52.63

## 2016-02-12 MED ORDER — CARBOPLATIN CHEMO INJECTION 600 MG/60ML
470.0000 mg | Freq: Once | INTRAVENOUS | Status: AC
  Administered 2016-02-12: 470 mg via INTRAVENOUS
  Filled 2016-02-12: qty 47

## 2016-02-12 MED ORDER — PALONOSETRON HCL INJECTION 0.25 MG/5ML
INTRAVENOUS | Status: AC
  Filled 2016-02-12: qty 5

## 2016-02-12 MED ORDER — SODIUM CHLORIDE 0.9% FLUSH
10.0000 mL | INTRAVENOUS | Status: DC | PRN
  Administered 2016-02-12: 10 mL via INTRAVENOUS
  Filled 2016-02-12: qty 10

## 2016-02-12 MED ORDER — PALONOSETRON HCL INJECTION 0.25 MG/5ML
0.2500 mg | Freq: Once | INTRAVENOUS | Status: AC
  Administered 2016-02-12: 0.25 mg via INTRAVENOUS

## 2016-02-12 MED ORDER — DEXAMETHASONE SODIUM PHOSPHATE 10 MG/ML IJ SOLN
10.0000 mg | Freq: Once | INTRAMUSCULAR | Status: AC
  Administered 2016-02-12: 10 mg via INTRAVENOUS

## 2016-02-12 NOTE — Patient Instructions (Signed)
[facility] Discharge Instructions for Patients Receiving Chemotherapy  Today you received the following chemotherapy agents Gemzar, Carboplatin.   To help prevent nausea and vomiting after your treatment, we encourage you to take your nausea medication as prescribed.    If you develop nausea and vomiting that is not controlled by your nausea medication, call the clinic.   BELOW ARE SYMPTOMS THAT SHOULD BE REPORTED IMMEDIATELY:  *FEVER GREATER THAN 100.5 F  *CHILLS WITH OR WITHOUT FEVER  NAUSEA AND VOMITING THAT IS NOT CONTROLLED WITH YOUR NAUSEA MEDICATION  *UNUSUAL SHORTNESS OF BREATH  *UNUSUAL BRUISING OR BLEEDING  TENDERNESS IN MOUTH AND THROAT WITH OR WITHOUT PRESENCE OF ULCERS  *URINARY PROBLEMS  *BOWEL PROBLEMS  UNUSUAL RASH Items with * indicate a potential emergency and should be followed up as soon as possible.  Feel free to call the clinic you have any questions or concerns. The clinic phone number is [phone].  Please show the CHEMO ALERT CARD at check-in to the Emergency Department and triage nurse.   

## 2016-02-12 NOTE — Telephone Encounter (Signed)
Per LOS I have scheduled appts and notified the scheduler 

## 2016-02-12 NOTE — Telephone Encounter (Signed)
Appointments scheduled per 12/26 LOS. Patient given AVS report and calendars with future scheduled appointments. °

## 2016-02-12 NOTE — Progress Notes (Signed)
Hematology and Oncology Follow Up Visit  David [name] [id number] 05/23/1960 55 y.o. 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 He presented with a 5 cm mass in the central left kidney as well as pulmonary metastasis, nodules and pleural effusion.   Prior Therapy: Status post biopsy obtained on 10/12/2015 which confirmed the presence of carcinoma. 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  Current therapy: Chemotherapy with gemcitabine and carboplatin cycle one  on 10/30/2015. He is here on day 1 of cycle 6 of therapy.  Interim History: Mr. David [name] presents today for a follow-up visit. Since the last visit, he reports continuous gradual improvement in his health. He tolerated chemotherapy without any new complications. He reports no fevers or chills. He is not reporting any dyspnea on exertion. He is not reporting any hematochezia or melena. He denied any nausea, vomiting or infusion related issues. He denies any respiratory symptoms of cough or dyspnea on exertion.  His appetite remains excellent and have gained more weight. His performance status continues to improve and have resumed all activities of daily living. He denied any hematuria or dysuria at this time. He denied any recent falls or syncope.   He denied any headaches, blurry vision, syncope or seizures. He does report fevers but no chills.He does not report any chest pain, palpitation, orthopnea, leg edema or dyspnea on exertion. He denied any cough, wheezing or hemoptysis. He does not report any nausea, vomiting or early satiety. He did report constipation which have resolved. He denied any diarrhea. He does not report any frequency, urgency or hesitancy. He does not report any skeletal complaints. Remaining review of system is unremarkable.   Medications: I have reviewed the patient's current medications.   Current Outpatient Prescriptions  Medication Sig Dispense Refill  . carvedilol (COREG) 3.125 MG tablet Take 1 tablet (3.125 mg total) by mouth 2 (two) times daily with a meal. 180 tablet 0  . chlorproMAZINE (THORAZINE) 50 MG tablet Take 1 tablet (50 mg total) by mouth 3 (three) times daily as needed for hiccoughs. 90 tablet 0  . clonazePAM (KLONOPIN) 0.5 MG tablet TAKE 1 TABLET BY MOUTH 1-2 TIMES DAILY AS NEEDED FOR ANXIETY. MAY CAUSE SEDATION 15 tablet 0  . cyanocobalamin (,VITAMIN B-12,) 1000 MCG/ML injection Please take intramuscular once weekly for 4 weeks, then change to once monthly after that 1 mL 12  . ferrous sulfate 325 (65 FE) MG tablet Take 1 tablet (325 mg total) by mouth 2 (two) times daily with a meal. 60 tablet 3  . glipiZIDE (GLUCOTROL) 5 MG tablet Take 1 tablet (5 mg total) by mouth daily before breakfast. 30 tablet 2  . HYDROcodone-acetaminophen (NORCO/VICODIN) 5-325 MG tablet Take 1 tablet by mouth every 6 (six) hours as needed for moderate pain. 15 tablet 0  . lidocaine-prilocaine (EMLA) cream Apply 1 application topically as needed. 30 g 0  . megestrol (MEGACE) 400 MG/10ML suspension Take 10 mLs (400 mg total) by mouth 2 (two) times daily. 240 mL 5  . metFORMIN (GLUCOPHAGE) 500 MG tablet Take 1 tablet (500 mg total) by mouth 2 (two) times daily with a meal. 180 tablet 1  . pantoprazole (PROTONIX) 40 MG tablet Take 1 tablet (40 mg total) by mouth daily at 6 (six) AM. 30 tablet 3  . prochlorperazine (COMPAZINE) 10 MG tablet Take 1 tablet (10 mg total) by mouth every 6 (six) hours as needed for  nausea or vomiting. (Patient not taking: Reported on 02/12/2016) 30 tablet 1   No current facility-administered medications for this visit.      Allergies: No Known Allergies  Past Medical History, Surgical history, Social history, and Family History were reviewed and updated.   Physical Exam: Blood pressure 137/70, pulse 71, temperature 98.2 F (36.8 C), temperature source Oral,  resp. rate 18, weight 204 lb 9.6 oz (92.8 kg), SpO2 100 %. ECOG: 1 General appearance: Alert, awake gentleman without distress. Head: Normocephalic, without obvious abnormality no oral ulcers or lesions. Neck: no adenopathy Lymph nodes: Cervical, supraclavicular, and axillary nodes normal. Heart:regular rate and rhythm, S1, S2 normal, no murmur, click, rub or gallop Lung:chest clear, no wheezing, rales, normal symmetric air entry Abdomin: soft, non-tender, without masses or organomegaly no shifting dullness or ascites. EXT:no erythema, induration, or nodules   Lab Results: Lab Results  Component Value Date   WBC 4.2 02/12/2016   HGB 9.0 (L) 02/12/2016   HCT 26.9 (L) 02/12/2016   MCV 91.6 02/12/2016   PLT 203 02/12/2016     Chemistry      Component Value Date/Time   NA 140 02/05/2016 0753   K 4.0 02/05/2016 0753   CL 102 11/26/2015 0750   CO2 24 02/05/2016 0753   BUN 21.1 02/05/2016 0753   CREATININE 1.0 02/05/2016 0753      Component Value Date/Time   CALCIUM 9.3 02/05/2016 0753   ALKPHOS 106 02/05/2016 0753   AST 15 02/05/2016 0753   ALT 16 02/05/2016 0753   BILITOT 0.42 02/05/2016 0753        Impression and Plan:  55 year old gentleman with the following issues:  1. 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 He is status post biopsy of a pleural-based nodule which confirmed the presence of carcinoma although further immunohistochemical staining was not performed because of lack of tissue.  These findings confirm the presence of advanced malignancy Arising from the renal pelvis.  He is currently receiving salvage chemotherapy with carboplatin and gemcitabine. Chemotherapy has been well tolerated with dramatic improvement in his quality of life.  CT scan on 11/09/2017showed a positive response with decrease of the volume of disease at this time.  The plan is to continue with the same dose and  schedule and proceed with day 1 of cycle 6 of therapy. I plan on repeat imaging studies upon completing cycle 6. Further chemotherapy will be determined depending on his CT scan response. If his CT scan as well as quality of life continues to improve on chemotherapy will continue with the same schedule at this time.  2. IV access: Port-A-Cath has been utilized without complications.  3. Nausea prophylaxis: He has prescription for antiemetics available to him. No issues reported at this time.  4. Fevers, chills and sweats: This have resolved at this time.  5. Weight loss: He says resolved at this time and is appetite continues to improve.  6. Anemia: Related to malignancy and chemotherapy infusion. He will continue to receive supportive transfusion as needed.  7. Follow-up: Will be in one week for day 8 of cycle 6 of chemotherapy.  [facility], MD 12/26/20178:48 AM

## 2016-02-19 ENCOUNTER — Ambulatory Visit: Payer: Medicaid Other

## 2016-02-19 ENCOUNTER — Ambulatory Visit (HOSPITAL_BASED_OUTPATIENT_CLINIC_OR_DEPARTMENT_OTHER): Payer: Medicaid Other

## 2016-02-19 ENCOUNTER — Other Ambulatory Visit (HOSPITAL_BASED_OUTPATIENT_CLINIC_OR_DEPARTMENT_OTHER): Payer: Medicaid Other

## 2016-02-19 ENCOUNTER — Ambulatory Visit (HOSPITAL_COMMUNITY)
Admission: RE | Admit: 2016-02-19 | Discharge: 2016-02-19 | Disposition: A | Discharge status: Home / Self Care | Payer: Medicaid Other | Source: Ambulatory Visit | Attending: Oncology | Admitting: Oncology

## 2016-02-19 VITALS — BP 145/91 | HR 65 | Temp 97.8°F | Resp 18

## 2016-02-19 DIAGNOSIS — C642 Malignant neoplasm of left kidney, except renal pelvis: Secondary | ICD-10-CM

## 2016-02-19 DIAGNOSIS — C659 Malignant neoplasm of unspecified renal pelvis: Secondary | ICD-10-CM

## 2016-02-19 DIAGNOSIS — Z5111 Encounter for antineoplastic chemotherapy: Secondary | ICD-10-CM

## 2016-02-19 DIAGNOSIS — D509 Iron deficiency anemia, unspecified: Secondary | ICD-10-CM | POA: Insufficient documentation

## 2016-02-19 DIAGNOSIS — Z95828 Presence of other vascular implants and grafts: Secondary | ICD-10-CM

## 2016-02-19 LAB — COMPREHENSIVE METABOLIC PANEL
ALT: 29 U/L (ref 0–55)
ANION GAP: 11 meq/L (ref 3–11)
AST: 19 U/L (ref 5–34)
Albumin: 3.5 g/dL (ref 3.5–5.0)
Alkaline Phosphatase: 92 U/L (ref 40–150)
BILIRUBIN TOTAL: 0.26 mg/dL (ref 0.20–1.20)
BUN: 13.1 mg/dL (ref 7.0–26.0)
CALCIUM: 9.5 mg/dL (ref 8.4–10.4)
CO2: 23 mEq/L (ref 22–29)
CREATININE: 1 mg/dL (ref 0.7–1.3)
Chloride: 107 mEq/L (ref 98–109)
EGFR: >90 mL/min/{1.73_m2} (ref >90)
Glucose: 119 mg/dl (ref 70–140)
Potassium: 4.4 mEq/L (ref 3.5–5.1)
Sodium: 141 mEq/L (ref 136–145)
TOTAL PROTEIN: 7.1 g/dL (ref 6.4–8.3)

## 2016-02-19 LAB — CBC WITH DIFFERENTIAL/PLATELET
BASO%: 1.1 % (ref 0.0–2.0)
Basophils Absolute: 0 10*3/uL (ref 0.0–0.1)
EOS%: 0.6 % (ref 0.0–7.0)
Eosinophils Absolute: 0 10*3/uL (ref 0.0–0.5)
HEMATOCRIT: 28.2 % — AB (ref 38.4–49.9)
HGB: 9.1 g/dL — ABNORMAL LOW (ref 13.0–17.1)
LYMPH#: 1.7 10*3/uL (ref 0.9–3.3)
LYMPH%: 47.9 % (ref 14.0–49.0)
MCH: 30 pg (ref 27.2–33.4)
MCHC: 32.3 g/dL (ref 32.0–36.0)
MCV: 93.1 fL (ref 79.3–98.0)
MONO#: 0.1 10*3/uL (ref 0.1–0.9)
MONO%: 3.9 % (ref 0.0–14.0)
NEUT%: 46.5 % (ref 39.0–75.0)
NEUTROS ABS: 1.7 10*3/uL (ref 1.5–6.5)
PLATELETS: 259 10*3/uL (ref 140–400)
RBC: 3.03 10*6/uL — AB (ref 4.20–5.82)
RDW: 19.3 % — ABNORMAL HIGH (ref 11.0–14.6)
WBC: 3.6 10*3/uL — AB (ref 4.0–10.3)

## 2016-02-19 MED ORDER — HEPARIN SOD (PORK) LOCK FLUSH 100 UNIT/ML IV SOLN
500.0000 [IU] | Freq: Once | INTRAVENOUS | Status: AC | PRN
  Administered 2016-02-19: 500 [IU]
  Filled 2016-02-19: qty 5

## 2016-02-19 MED ORDER — PROCHLORPERAZINE MALEATE 10 MG PO TABS
ORAL_TABLET | ORAL | Status: AC
  Filled 2016-02-19: qty 1

## 2016-02-19 MED ORDER — PROCHLORPERAZINE MALEATE 10 MG PO TABS
10.0000 mg | ORAL_TABLET | Freq: Once | ORAL | Status: AC
  Administered 2016-02-19: 10 mg via ORAL

## 2016-02-19 MED ORDER — SODIUM CHLORIDE 0.9 % IV SOLN
2000.0000 mg | Freq: Once | INTRAVENOUS | Status: AC
  Administered 2016-02-19: 2000 mg via INTRAVENOUS
  Filled 2016-02-19: qty 52.63

## 2016-02-19 MED ORDER — SODIUM CHLORIDE 0.9 % IV SOLN: 2000.0000 mg | Freq: Once | INTRAVENOUS | Status: DC

## 2016-02-19 MED ORDER — SODIUM CHLORIDE 0.9 % IV SOLN
Freq: Once | INTRAVENOUS | Status: AC
  Administered 2016-02-19: 09:00:00 via INTRAVENOUS

## 2016-02-19 MED ORDER — SODIUM CHLORIDE 0.9% FLUSH
10.0000 mL | INTRAVENOUS | Status: DC | PRN
  Administered 2016-02-19: 10 mL via INTRAVENOUS
  Filled 2016-02-19: qty 10

## 2016-02-19 MED ORDER — SODIUM CHLORIDE 0.9% FLUSH
10.0000 mL | INTRAVENOUS | Status: DC | PRN
  Administered 2016-02-19: 10 mL
  Filled 2016-02-19: qty 10

## 2016-02-19 NOTE — Patient Instructions (Signed)
[facility] Discharge Instructions for Patients Receiving Chemotherapy  Today you received the following chemotherapy agents Gemcitabine.   To help prevent nausea and vomiting after your treatment, we encourage you to take your nausea medication as directed.    If you develop nausea and vomiting that is not controlled by your nausea medication, call the clinic.   BELOW ARE SYMPTOMS THAT SHOULD BE REPORTED IMMEDIATELY:  *FEVER GREATER THAN 100.5 F  *CHILLS WITH OR WITHOUT FEVER  NAUSEA AND VOMITING THAT IS NOT CONTROLLED WITH YOUR NAUSEA MEDICATION  *UNUSUAL SHORTNESS OF BREATH  *UNUSUAL BRUISING OR BLEEDING  TENDERNESS IN MOUTH AND THROAT WITH OR WITHOUT PRESENCE OF ULCERS  *URINARY PROBLEMS  *BOWEL PROBLEMS  UNUSUAL RASH Items with * indicate a potential emergency and should be followed up as soon as possible.  Feel free to call the clinic you have any questions or concerns. The clinic phone number is [phone].  Please show the CHEMO ALERT CARD at check-in to the Emergency Department and triage nurse.   

## 2016-02-26 ENCOUNTER — Other Ambulatory Visit: Payer: Self-pay | Admitting: *Deleted

## 2016-02-26 ENCOUNTER — Ambulatory Visit (HOSPITAL_BASED_OUTPATIENT_CLINIC_OR_DEPARTMENT_OTHER): Payer: Medicaid Other

## 2016-02-26 ENCOUNTER — Other Ambulatory Visit (HOSPITAL_COMMUNITY)
Admission: RE | Admit: 2016-02-26 | Discharge: 2016-02-26 | Disposition: A | Discharge status: Home / Self Care | Payer: Medicaid Other | Source: Ambulatory Visit | Attending: Oncology | Admitting: Oncology

## 2016-02-26 DIAGNOSIS — C642 Malignant neoplasm of left kidney, except renal pelvis: Secondary | ICD-10-CM

## 2016-02-26 DIAGNOSIS — D509 Iron deficiency anemia, unspecified: Secondary | ICD-10-CM

## 2016-02-26 DIAGNOSIS — D630 Anemia in neoplastic disease: Secondary | ICD-10-CM

## 2016-02-26 LAB — CBC WITH DIFFERENTIAL/PLATELET
BASO%: 0.4 % (ref 0.0–2.0)
BASOS ABS: 0 10*3/uL (ref 0.0–0.1)
EOS%: 0 % (ref 0.0–7.0)
Eosinophils Absolute: 0 10*3/uL (ref 0.0–0.5)
HCT: 23.9 % — ABNORMAL LOW (ref 38.4–49.9)
HGB: 7.7 g/dL — ABNORMAL LOW (ref 13.0–17.1)
LYMPH%: 53 % — AB (ref 14.0–49.0)
MCH: 30 pg (ref 27.2–33.4)
MCHC: 32.2 g/dL (ref 32.0–36.0)
MCV: 93 fL (ref 79.3–98.0)
MONO#: 0.3 10*3/uL (ref 0.1–0.9)
MONO%: 13 % (ref 0.0–14.0)
NEUT#: 0.8 10*3/uL — ABNORMAL LOW (ref 1.5–6.5)
NEUT%: 33.6 % — AB (ref 39.0–75.0)
Platelets: 41 10*3/uL — ABNORMAL LOW (ref 140–400)
RBC: 2.57 10*6/uL — AB (ref 4.20–5.82)
RDW: 19 % — ABNORMAL HIGH (ref 11.0–14.6)
WBC: 2.5 10*3/uL — ABNORMAL LOW (ref 4.0–10.3)
lymph#: 1.3 10*3/uL (ref 0.9–3.3)

## 2016-02-26 LAB — TECHNOLOGIST REVIEW

## 2016-02-26 LAB — PREPARE RBC (CROSSMATCH)

## 2016-02-26 MED ORDER — HEPARIN SOD (PORK) LOCK FLUSH 100 UNIT/ML IV SOLN
500.0000 [IU] | Freq: Every day | INTRAVENOUS | Status: AC | PRN
  Administered 2016-02-26: 500 [IU]
  Filled 2016-02-26: qty 5

## 2016-02-26 MED ORDER — DIPHENHYDRAMINE HCL 25 MG PO CAPS
ORAL_CAPSULE | ORAL | Status: AC
  Filled 2016-02-26: qty 1

## 2016-02-26 MED ORDER — ACETAMINOPHEN 325 MG PO TABS
ORAL_TABLET | ORAL | Status: AC
  Filled 2016-02-26: qty 2

## 2016-02-26 MED ORDER — SODIUM CHLORIDE 0.9% FLUSH
10.0000 mL | INTRAVENOUS | Status: AC | PRN
  Administered 2016-02-26: 10 mL
  Filled 2016-02-26: qty 10

## 2016-02-26 MED ORDER — ACETAMINOPHEN 325 MG PO TABS
650.0000 mg | ORAL_TABLET | Freq: Once | ORAL | Status: AC
  Administered 2016-02-26: 650 mg via ORAL

## 2016-02-26 MED ORDER — DIPHENHYDRAMINE HCL 25 MG PO CAPS
25.0000 mg | ORAL_CAPSULE | Freq: Once | ORAL | Status: AC
  Administered 2016-02-26: 25 mg via ORAL

## 2016-02-26 MED ORDER — SODIUM CHLORIDE 0.9 % IV SOLN
250.0000 mL | Freq: Once | INTRAVENOUS | Status: AC
  Administered 2016-02-26: 250 mL via INTRAVENOUS

## 2016-02-26 NOTE — Patient Instructions (Signed)
Blood Transfusion , Adult A blood transfusion is a procedure in which you receive donated blood, including plasma, platelets, and red blood cells, through an IV tube. You may need a blood transfusion because of illness, surgery, or injury. The blood may come from a donor. You may also be able to donate blood for yourself (autologous blood donation) before a surgery if you know that you might require a blood transfusion. The blood given in a transfusion is made up of different types of cells. You may receive:  Red blood cells. These carry oxygen to the cells in the body.  White blood cells. These help you fight infections.  Platelets. These help your blood to clot.  Plasma. This is the liquid part of your blood and it helps with fluid imbalances. If you have hemophilia or another clotting disorder, you may also receive other types of blood products. Tell a health care provider about:  Any allergies you have.  All medicines you are taking, including vitamins, herbs, eye drops, creams, and over-the-counter medicines.  Any problems you or family members have had with anesthetic medicines.  Any blood disorders you have.  Any surgeries you have had.  Any medical conditions you have, including any recent fever or cold symptoms.  Whether you are pregnant or may be pregnant.  Any previous reactions you have had during a blood transfusion. What are the risks? Generally, this is a safe procedure. However, problems may occur, including:  Having an allergic reaction to something in the donated blood. Hives and itching may be symptoms of this type of reaction.  Fever. This may be a reaction to the white blood cells in the transfused blood. Nausea or chest pain may accompany a fever.  Iron overload. This can happen from having many transfusions.  Transfusion-related acute lung injury (TRALI). This is a rare reaction that causes lung damage. The cause is not known.TRALI can occur within hours  of a transfusion or several days later.  Sudden (acute) or delayed hemolytic reactions. This happens if your blood does not match the cells in your transfusion. Your body's defense system (immune system) may try to attack the new cells. This complication is rare. The symptoms include fever, chills, nausea, and low back pain or chest pain.  Infection or disease transmission. This is rare. What happens before the procedure?  You will have a blood test to determine your blood type. This is necessary to know what kind of blood your body will accept and to match it to the donor blood.  If you are going to have a planned surgery, you may be able to do an autologous blood donation. This may be done in case you need to have a transfusion.  If you have had an allergic reaction to a transfusion in the past, you may be given medicine to help prevent a reaction. This medicine may be given to you by mouth or through an IV tube.  You will have your temperature, blood pressure, and pulse monitored before the transfusion.  Follow instructions from your health care provider about eating and drinking restrictions.  Ask your health care provider about:  Changing or stopping your regular medicines. This is especially important if you are taking diabetes medicines or blood thinners.  Taking medicines such as aspirin and ibuprofen. These medicines can thin your blood. Do not take these medicines before your procedure if your health care provider instructs you not to. What happens during the procedure?  An IV tube will be   inserted into one of your veins.  The bag of donated blood will be attached to your IV tube. The blood will then enter through your vein.  Your temperature, blood pressure, and pulse will be monitored regularly during the transfusion. This monitoring is done to detect early signs of a transfusion reaction.  If you have any signs or symptoms of a reaction, your transfusion will be stopped and  you may be given medicine.  When the transfusion is complete, your IV tube will be removed.  Pressure may be applied to the IV site for a few minutes.  A bandage (dressing) will be applied. The procedure may vary among health care providers and hospitals. What happens after the procedure?  Your temperature, blood pressure, heart rate, breathing rate, and blood oxygen level will be monitored often.  Your blood may be tested to see how you are responding to the transfusion.  You may be warmed with fluids or blankets to maintain a normal body temperature. Summary  A blood transfusion is a procedure in which you receive donated blood, including plasma, platelets, and red blood cells, through an IV tube.  Your temperature, blood pressure, and pulse will be monitored before, during, and after the transfusion.  Your blood may be tested after the transfusion to see how your body has responded. This information is not intended to replace advice given to you by your health care provider. Make sure you discuss any questions you have with your health care provider. Document Released: 02/01/2000 Document Revised: 11/01/2015 Document Reviewed: 11/01/2015 [employer]vier Interactive Patient Education  2017 [employer]  

## 2016-02-27 LAB — TYPE AND SCREEN
Blood Product Expiration Date: 201801242359
ISSUE DATE / TIME: 201801091356
UNIT TYPE AND RH: 7300

## 2016-02-29 ENCOUNTER — Ambulatory Visit (HOSPITAL_COMMUNITY): Payer: Medicaid Other

## 2016-03-03 ENCOUNTER — Ambulatory Visit (HOSPITAL_COMMUNITY)
Admission: RE | Admit: 2016-03-03 | Discharge: 2016-03-03 | Disposition: A | Discharge status: Home / Self Care | Payer: Medicaid Other | Source: Ambulatory Visit | Attending: Oncology | Admitting: Oncology

## 2016-03-03 ENCOUNTER — Other Ambulatory Visit: Payer: Self-pay | Admitting: *Deleted

## 2016-03-03 DIAGNOSIS — R918 Other nonspecific abnormal finding of lung field: Secondary | ICD-10-CM | POA: Insufficient documentation

## 2016-03-03 DIAGNOSIS — C659 Malignant neoplasm of unspecified renal pelvis: Secondary | ICD-10-CM

## 2016-03-03 DIAGNOSIS — C7951 Secondary malignant neoplasm of bone: Secondary | ICD-10-CM | POA: Diagnosis not present

## 2016-03-03 DIAGNOSIS — N400 Enlarged prostate without lower urinary tract symptoms: Secondary | ICD-10-CM | POA: Insufficient documentation

## 2016-03-03 DIAGNOSIS — K769 Liver disease, unspecified: Secondary | ICD-10-CM | POA: Insufficient documentation

## 2016-03-03 DIAGNOSIS — K5730 Diverticulosis of large intestine without perforation or abscess without bleeding: Secondary | ICD-10-CM | POA: Diagnosis not present

## 2016-03-03 DIAGNOSIS — K8020 Calculus of gallbladder without cholecystitis without obstruction: Secondary | ICD-10-CM | POA: Insufficient documentation

## 2016-03-03 DIAGNOSIS — C782 Secondary malignant neoplasm of pleura: Secondary | ICD-10-CM | POA: Diagnosis not present

## 2016-03-03 DIAGNOSIS — I700 Atherosclerosis of aorta: Secondary | ICD-10-CM | POA: Insufficient documentation

## 2016-03-03 DIAGNOSIS — C642 Malignant neoplasm of left kidney, except renal pelvis: Secondary | ICD-10-CM | POA: Diagnosis not present

## 2016-03-03 MED ORDER — IOPAMIDOL (ISOVUE-300) INJECTION 61%
100.0000 mL | Freq: Once | INTRAVENOUS | Status: AC | PRN
  Administered 2016-03-03: 100 mL via INTRAVENOUS

## 2016-03-03 MED ORDER — IOPAMIDOL (ISOVUE-300) INJECTION 61%
INTRAVENOUS | Status: AC
  Filled 2016-03-03: qty 100

## 2016-03-04 ENCOUNTER — Other Ambulatory Visit: Payer: Medicaid Other

## 2016-03-04 ENCOUNTER — Ambulatory Visit (HOSPITAL_BASED_OUTPATIENT_CLINIC_OR_DEPARTMENT_OTHER): Payer: Medicaid Other | Admitting: Oncology

## 2016-03-04 ENCOUNTER — Telehealth: Payer: Self-pay | Admitting: *Deleted

## 2016-03-04 ENCOUNTER — Telehealth: Payer: Self-pay | Admitting: Oncology

## 2016-03-04 ENCOUNTER — Ambulatory Visit (HOSPITAL_BASED_OUTPATIENT_CLINIC_OR_DEPARTMENT_OTHER): Payer: Medicaid Other

## 2016-03-04 ENCOUNTER — Ambulatory Visit: Payer: Medicaid Other

## 2016-03-04 VITALS — BP 150/73 | HR 66 | Temp 97.0°F | Resp 18 | Wt 207.2 lb

## 2016-03-04 DIAGNOSIS — D6481 Anemia due to antineoplastic chemotherapy: Secondary | ICD-10-CM | POA: Diagnosis not present

## 2016-03-04 DIAGNOSIS — Z5111 Encounter for antineoplastic chemotherapy: Secondary | ICD-10-CM | POA: Diagnosis present

## 2016-03-04 DIAGNOSIS — C659 Malignant neoplasm of unspecified renal pelvis: Secondary | ICD-10-CM

## 2016-03-04 DIAGNOSIS — C7800 Secondary malignant neoplasm of unspecified lung: Secondary | ICD-10-CM | POA: Diagnosis not present

## 2016-03-04 DIAGNOSIS — C652 Malignant neoplasm of left renal pelvis: Secondary | ICD-10-CM | POA: Diagnosis not present

## 2016-03-04 DIAGNOSIS — D630 Anemia in neoplastic disease: Secondary | ICD-10-CM

## 2016-03-04 DIAGNOSIS — C642 Malignant neoplasm of left kidney, except renal pelvis: Secondary | ICD-10-CM

## 2016-03-04 LAB — CBC WITH DIFFERENTIAL/PLATELET
BASO%: 0.7 % (ref 0.0–2.0)
Basophils Absolute: 0 10*3/uL (ref 0.0–0.1)
EOS%: 1.2 % (ref 0.0–7.0)
Eosinophils Absolute: 0.1 10*3/uL (ref 0.0–0.5)
HEMATOCRIT: 30.3 % — AB (ref 38.4–49.9)
HGB: 9.9 g/dL — ABNORMAL LOW (ref 13.0–17.1)
LYMPH#: 1.5 10*3/uL (ref 0.9–3.3)
LYMPH%: 25.6 % (ref 14.0–49.0)
MCH: 30.9 pg (ref 27.2–33.4)
MCHC: 32.7 g/dL (ref 32.0–36.0)
MCV: 94.7 fL (ref 79.3–98.0)
MONO#: 1.5 10*3/uL — AB (ref 0.1–0.9)
MONO%: 26.7 % — ABNORMAL HIGH (ref 0.0–14.0)
NEUT%: 45.8 % (ref 39.0–75.0)
NEUTROS ABS: 2.6 10*3/uL (ref 1.5–6.5)
PLATELETS: 170 10*3/uL (ref 140–400)
RBC: 3.2 10*6/uL — ABNORMAL LOW (ref 4.20–5.82)
RDW: 19.7 % — AB (ref 11.0–14.6)
WBC: 5.8 10*3/uL (ref 4.0–10.3)

## 2016-03-04 LAB — COMPREHENSIVE METABOLIC PANEL
ALT: 20 U/L (ref 0–55)
AST: 20 U/L (ref 5–34)
Albumin: 3.5 g/dL (ref 3.5–5.0)
Alkaline Phosphatase: 91 U/L (ref 40–150)
Anion Gap: 8 mEq/L (ref 3–11)
BILIRUBIN TOTAL: 0.32 mg/dL (ref 0.20–1.20)
BUN: 9.6 mg/dL (ref 7.0–26.0)
CALCIUM: 9.1 mg/dL (ref 8.4–10.4)
CO2: 24 meq/L (ref 22–29)
CREATININE: 1.1 mg/dL (ref 0.7–1.3)
Chloride: 106 mEq/L (ref 98–109)
EGFR: 83 mL/min/{1.73_m2} — ABNORMAL LOW (ref >90)
Glucose: 95 mg/dl (ref 70–140)
Potassium: 4.4 mEq/L (ref 3.5–5.1)
Sodium: 138 mEq/L (ref 136–145)
TOTAL PROTEIN: 6.9 g/dL (ref 6.4–8.3)

## 2016-03-04 MED ORDER — DEXAMETHASONE SODIUM PHOSPHATE 10 MG/ML IJ SOLN
INTRAMUSCULAR | Status: AC
  Filled 2016-03-04: qty 1

## 2016-03-04 MED ORDER — HEPARIN SOD (PORK) LOCK FLUSH 100 UNIT/ML IV SOLN
500.0000 [IU] | Freq: Once | INTRAVENOUS | Status: AC | PRN
  Administered 2016-03-04: 500 [IU]
  Filled 2016-03-04: qty 5

## 2016-03-04 MED ORDER — PALONOSETRON HCL INJECTION 0.25 MG/5ML
0.2500 mg | Freq: Once | INTRAVENOUS | Status: AC
  Administered 2016-03-04: 0.25 mg via INTRAVENOUS

## 2016-03-04 MED ORDER — SODIUM CHLORIDE 0.9% FLUSH
10.0000 mL | INTRAVENOUS | Status: DC | PRN
  Administered 2016-03-04: 10 mL
  Filled 2016-03-04: qty 10

## 2016-03-04 MED ORDER — SODIUM CHLORIDE 0.9 % IV SOLN
Freq: Once | INTRAVENOUS | Status: AC
  Administered 2016-03-04: 09:00:00 via INTRAVENOUS

## 2016-03-04 MED ORDER — PALONOSETRON HCL INJECTION 0.25 MG/5ML
INTRAVENOUS | Status: AC
  Filled 2016-03-04: qty 5

## 2016-03-04 MED ORDER — SODIUM CHLORIDE 0.9 % IV SOLN
1000.0000 mg/m2 | Freq: Once | INTRAVENOUS | Status: AC
  Administered 2016-03-04: 2090 mg via INTRAVENOUS
  Filled 2016-03-04: qty 52.63

## 2016-03-04 MED ORDER — SODIUM CHLORIDE 0.9 % IV SOLN
470.0000 mg | Freq: Once | INTRAVENOUS | Status: AC
  Administered 2016-03-04: 470 mg via INTRAVENOUS
  Filled 2016-03-04: qty 47

## 2016-03-04 MED ORDER — DEXAMETHASONE SODIUM PHOSPHATE 10 MG/ML IJ SOLN
10.0000 mg | Freq: Once | INTRAMUSCULAR | Status: AC
  Administered 2016-03-04: 10 mg via INTRAVENOUS

## 2016-03-04 NOTE — Progress Notes (Signed)
Hematology and Oncology Follow Up Visit  David [name] AN:[id number] 11/22/1960 56 y.o. 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 He presented with a 5 cm mass in the central left kidney as well as pulmonary metastasis, nodules and pleural effusion.   Prior Therapy: Status post biopsy obtained on 10/12/2015 which confirmed the presence of carcinoma. 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  Current therapy: Chemotherapy with gemcitabine and carboplatin cycle one  on 10/30/2015. He is here on day 1 of cycle 7 of therapy.  Interim History: David [name] presents today for a follow-up visit. Since the last visit, he reports no changes in his health. He continues to enjoy much improved quality of life and performance status since the start treatment of chemotherapy. He tolerated chemotherapy without any new complications. He reports no fevers or chills. He is not reporting any dyspnea on exertion. He is not reporting any hematochezia or melena. He denied any nausea, vomiting or infusion related issues. He denies any respiratory symptoms of cough or dyspnea on exertion.  He did require packed red cell transfusion last week with improvement in his symptoms. His has exercise tolerance is much improved. He denied any peripheral neuropathy or any recent hospitalizations. His appetite continues to improve and gaining weight.   He denied any headaches, blurry vision, syncope or seizures. He does report fevers but no chills.He does not report any chest pain, palpitation, orthopnea, leg edema or dyspnea on exertion. He denied any cough, wheezing or hemoptysis. He does not report any nausea, vomiting or early satiety. He did report constipation which have resolved. He denied any diarrhea. He does not report any frequency, urgency or hesitancy. He does not report any skeletal complaints.  Remaining review of system is unremarkable.   Medications: I have reviewed the patient's current medications.  Current Outpatient Prescriptions  Medication Sig Dispense Refill  . carvedilol (COREG) 3.125 MG tablet Take 1 tablet (3.125 mg total) by mouth 2 (two) times daily with a meal. 180 tablet 0  . chlorproMAZINE (THORAZINE) 50 MG tablet Take 1 tablet (50 mg total) by mouth 3 (three) times daily as needed for hiccoughs. 90 tablet 0  . clonazePAM (KLONOPIN) 0.5 MG tablet TAKE 1 TABLET BY MOUTH 1-2 TIMES DAILY AS NEEDED FOR ANXIETY. MAY CAUSE SEDATION 15 tablet 0  . cyanocobalamin (,VITAMIN B-12,) 1000 MCG/ML injection Please take intramuscular once weekly for 4 weeks, then change to once monthly after that 1 mL 12  . ferrous sulfate 325 (65 FE) MG tablet Take 1 tablet (325 mg total) by mouth 2 (two) times daily with a meal. 60 tablet 3  . glipiZIDE (GLUCOTROL) 5 MG tablet Take 1 tablet (5 mg total) by mouth daily before breakfast. 30 tablet 2  . HYDROcodone-acetaminophen (NORCO/VICODIN) 5-325 MG tablet Take 1 tablet by mouth every 6 (six) hours as needed for moderate pain. 15 tablet 0  . lidocaine-prilocaine (EMLA) cream Apply 1 application topically as needed. 30 g 0  . megestrol (MEGACE) 400 MG/10ML suspension Take 10 mLs (400 mg total) by mouth 2 (two) times daily. 240 mL 5  . metFORMIN (GLUCOPHAGE) 500 MG tablet Take 1 tablet (500 mg total) by mouth 2 (two) times daily with a meal. 180 tablet 1  . pantoprazole (PROTONIX) 40 MG tablet Take 1 tablet (40 mg total) by mouth daily at 6 (six) AM. 30 tablet 3  . prochlorperazine (COMPAZINE) 10 MG  tablet Take 1 tablet (10 mg total) by mouth every 6 (six) hours as needed for nausea or vomiting. 30 tablet 1   No current facility-administered medications for this visit.      Allergies: No Known Allergies  Past Medical History, Surgical history, Social history, and Family History were reviewed and updated.   Physical Exam: Blood pressure (!)  150/73, pulse 66, temperature 97 F (36.1 C), temperature source Oral, resp. rate 18, weight 207 lb 4 oz (94 kg), SpO2 100 %. ECOG: 1 General appearance: Alert, awake gentleman without distress.Appeared comfortable. Head: Normocephalic, without obvious abnormality no oral thrush noted. Neck: no adenopathy Lymph nodes: Cervical, supraclavicular, and axillary nodes normal. Heart:regular rate and rhythm, S1, S2 normal, no murmur, click, rub or gallop Lung:chest clear, no wheezing, rales, normal symmetric air entry Abdomin: soft, non-tender, without masses or organomegaly no rebound or guarding. EXT:no erythema, induration, or nodules   Lab Results: Lab Results  Component Value Date   WBC 5.8 03/04/2016   HGB 9.9 (L) 03/04/2016   HCT 30.3 (L) 03/04/2016   MCV 94.7 03/04/2016   PLT 170 03/04/2016     Chemistry      Component Value Date/Time   NA 141 02/19/2016 0757   K 4.4 02/19/2016 0757   CL 102 11/26/2015 0750   CO2 23 02/19/2016 0757   BUN 13.1 02/19/2016 0757   CREATININE 1.0 02/19/2016 0757      Component Value Date/Time   CALCIUM 9.5 02/19/2016 0757   ALKPHOS 92 02/19/2016 0757   AST 19 02/19/2016 0757   ALT 29 02/19/2016 0757   BILITOT 0.26 02/19/2016 0757        Impression and Plan:  56 year old gentleman with the following issues:  1. 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 He is status post biopsy of a pleural-based nodule which confirmed the presence of carcinoma although further immunohistochemical staining was not performed because of lack of tissue.  These findings confirm the presence of advanced malignancy Arising from the renal pelvis.  He is currently receiving salvage chemotherapy with carboplatin and gemcitabine. Chemotherapy has been well tolerated with dramatic improvement in his quality of life.  CT scan on 11/09/2017showed a positive response with decrease of the volume of  disease at this time.  CT scan obtained on 03/03/2016 is currently pending. Clinically he continues to show excellent response to systemic therapy. Risks and benefits of continuing chemotherapy were reviewed today and regardless of the CT scan results, I recommended continuing at least for the time being his current regimen. He is agreeable to proceed given the excellent tolerance and improvement in his quality of life. I plan to repeat imaging studies in 2 months to ensure stability of his disease.  2. IV access: Port-A-Cath has been utilized without complications.  3. Nausea prophylaxis: He has prescription for antiemetics available to him. No issues reported at this time.  4. Weight loss: He says resolved at this time and is appetite continues to improve. He gained weight since the last visit.  5. Anemia: Related to malignancy and chemotherapy infusion. He will continue to receive supportive transfusion as needed.  6. Follow-up: Will be in one week for day 8 of cycle 7 of chemotherapy.  [facility], MD 1/16/20188:42 AM

## 2016-03-04 NOTE — Patient Instructions (Signed)
[facility] Discharge Instructions for Patients Receiving Chemotherapy  Today you received the following chemotherapy agents: Gemzar and Carboplatin   To help prevent nausea and vomiting after your treatment, we encourage you to take your nausea medication as directed.    If you develop nausea and vomiting that is not controlled by your nausea medication, call the clinic.   BELOW ARE SYMPTOMS THAT SHOULD BE REPORTED IMMEDIATELY:  *FEVER GREATER THAN 100.5 F  *CHILLS WITH OR WITHOUT FEVER  NAUSEA AND VOMITING THAT IS NOT CONTROLLED WITH YOUR NAUSEA MEDICATION  *UNUSUAL SHORTNESS OF BREATH  *UNUSUAL BRUISING OR BLEEDING  TENDERNESS IN MOUTH AND THROAT WITH OR WITHOUT PRESENCE OF ULCERS  *URINARY PROBLEMS  *BOWEL PROBLEMS  UNUSUAL RASH Items with * indicate a potential emergency and should be followed up as soon as possible.  Feel free to call the clinic you have any questions or concerns. The clinic phone number is [phone].  Please show the CHEMO ALERT CARD at check-in to the Emergency Department and triage nurse.   

## 2016-03-04 NOTE — Telephone Encounter (Signed)
Per 1/16 LOS I have scheduled apapts and notified the scheduler

## 2016-03-04 NOTE — Telephone Encounter (Signed)
Message sent to chemo scheduler to be added per 03/04/16 los. Patient was given a copy of the AVS report and appointment schedule, per 03/04/16 los. Appointments scheduled per 03/04/16 los.

## 2016-03-11 ENCOUNTER — Ambulatory Visit (HOSPITAL_BASED_OUTPATIENT_CLINIC_OR_DEPARTMENT_OTHER): Payer: Medicaid Other

## 2016-03-11 ENCOUNTER — Other Ambulatory Visit (HOSPITAL_BASED_OUTPATIENT_CLINIC_OR_DEPARTMENT_OTHER): Payer: Medicaid Other

## 2016-03-11 ENCOUNTER — Ambulatory Visit: Payer: Medicaid Other

## 2016-03-11 VITALS — BP 147/88 | HR 63 | Temp 98.1°F | Resp 18

## 2016-03-11 DIAGNOSIS — C659 Malignant neoplasm of unspecified renal pelvis: Secondary | ICD-10-CM

## 2016-03-11 DIAGNOSIS — C652 Malignant neoplasm of left renal pelvis: Secondary | ICD-10-CM | POA: Diagnosis not present

## 2016-03-11 DIAGNOSIS — Z5111 Encounter for antineoplastic chemotherapy: Secondary | ICD-10-CM

## 2016-03-11 DIAGNOSIS — Z95828 Presence of other vascular implants and grafts: Secondary | ICD-10-CM

## 2016-03-11 DIAGNOSIS — C642 Malignant neoplasm of left kidney, except renal pelvis: Secondary | ICD-10-CM | POA: Diagnosis not present

## 2016-03-11 LAB — CBC WITH DIFFERENTIAL/PLATELET
BASO%: 3.8 % — ABNORMAL HIGH (ref 0.0–2.0)
BASOS ABS: 0.1 10*3/uL (ref 0.0–0.1)
EOS ABS: 0 10*3/uL (ref 0.0–0.5)
EOS%: 1.2 % (ref 0.0–7.0)
HCT: 29.3 % — ABNORMAL LOW (ref 38.4–49.9)
HEMOGLOBIN: 9.9 g/dL — AB (ref 13.0–17.1)
LYMPH%: 46.2 % (ref 14.0–49.0)
MCH: 31.6 pg (ref 27.2–33.4)
MCHC: 33.7 g/dL (ref 32.0–36.0)
MCV: 93.9 fL (ref 79.3–98.0)
MONO#: 0.1 10*3/uL (ref 0.1–0.9)
MONO%: 3.7 % (ref 0.0–14.0)
NEUT%: 45.1 % (ref 39.0–75.0)
NEUTROS ABS: 1.3 10*3/uL — AB (ref 1.5–6.5)
PLATELETS: 231 10*3/uL (ref 140–400)
RBC: 3.12 10*6/uL — AB (ref 4.20–5.82)
RDW: 20.3 % — ABNORMAL HIGH (ref 11.0–14.6)
WBC: 2.8 10*3/uL — AB (ref 4.0–10.3)
lymph#: 1.3 10*3/uL (ref 0.9–3.3)

## 2016-03-11 LAB — COMPREHENSIVE METABOLIC PANEL
ALBUMIN: 3.5 g/dL (ref 3.5–5.0)
ALK PHOS: 94 U/L (ref 40–150)
ALT: 14 U/L (ref 0–55)
ANION GAP: 10 meq/L (ref 3–11)
AST: 16 U/L (ref 5–34)
BILIRUBIN TOTAL: 0.35 mg/dL (ref 0.20–1.20)
BUN: 16.1 mg/dL (ref 7.0–26.0)
CO2: 24 mEq/L (ref 22–29)
Calcium: 9.3 mg/dL (ref 8.4–10.4)
Chloride: 106 mEq/L (ref 98–109)
Creatinine: 1 mg/dL (ref 0.7–1.3)
Glucose: 102 mg/dl (ref 70–140)
Potassium: 4.3 mEq/L (ref 3.5–5.1)
Sodium: 140 mEq/L (ref 136–145)
TOTAL PROTEIN: 6.9 g/dL (ref 6.4–8.3)

## 2016-03-11 MED ORDER — SODIUM CHLORIDE 0.9% FLUSH
10.0000 mL | INTRAVENOUS | Status: DC | PRN
Start: 2016-03-11 — End: 2016-03-11
  Administered 2016-03-11: 10 mL via INTRAVENOUS
  Filled 2016-03-11: qty 10

## 2016-03-11 MED ORDER — SODIUM CHLORIDE 0.9 % IV SOLN
Freq: Once | INTRAVENOUS | Status: AC
  Administered 2016-03-11: 09:00:00 via INTRAVENOUS

## 2016-03-11 MED ORDER — PROCHLORPERAZINE MALEATE 10 MG PO TABS
10.0000 mg | ORAL_TABLET | Freq: Once | ORAL | Status: AC
  Administered 2016-03-11: 10 mg via ORAL

## 2016-03-11 MED ORDER — HEPARIN SOD (PORK) LOCK FLUSH 100 UNIT/ML IV SOLN
500.0000 [IU] | Freq: Once | INTRAVENOUS | Status: AC | PRN
  Administered 2016-03-11: 500 [IU]
  Filled 2016-03-11: qty 5

## 2016-03-11 MED ORDER — SODIUM CHLORIDE 0.9 % IV SOLN
1000.0000 mg/m2 | Freq: Once | INTRAVENOUS | Status: AC
  Administered 2016-03-11: 2090 mg via INTRAVENOUS
  Filled 2016-03-11: qty 52.63

## 2016-03-11 MED ORDER — PROCHLORPERAZINE MALEATE 10 MG PO TABS
ORAL_TABLET | ORAL | Status: AC
  Filled 2016-03-11: qty 1

## 2016-03-11 MED ORDER — SODIUM CHLORIDE 0.9% FLUSH
10.0000 mL | INTRAVENOUS | Status: DC | PRN
  Administered 2016-03-11: 10 mL
  Filled 2016-03-11: qty 10

## 2016-03-11 NOTE — Progress Notes (Signed)
Ok to treat with ANC 1.3 per Dr. Shadad. 

## 2016-03-11 NOTE — Patient Instructions (Signed)
[facility] Discharge Instructions for Patients Receiving Chemotherapy  Today you received the following chemotherapy agents Gemzar  To help prevent nausea and vomiting after your treatment, we encourage you to take your nausea medication as prescribed   If you develop nausea and vomiting that is not controlled by your nausea medication, call the clinic.   BELOW ARE SYMPTOMS THAT SHOULD BE REPORTED IMMEDIATELY:  *FEVER GREATER THAN 100.5 F  *CHILLS WITH OR WITHOUT FEVER  NAUSEA AND VOMITING THAT IS NOT CONTROLLED WITH YOUR NAUSEA MEDICATION  *UNUSUAL SHORTNESS OF BREATH  *UNUSUAL BRUISING OR BLEEDING  TENDERNESS IN MOUTH AND THROAT WITH OR WITHOUT PRESENCE OF ULCERS  *URINARY PROBLEMS  *BOWEL PROBLEMS  UNUSUAL RASH Items with * indicate a potential emergency and should be followed up as soon as possible.  Feel free to call the clinic you have any questions or concerns. The clinic phone number is [phone].  Please show the CHEMO ALERT CARD at check-in to the Emergency Department and triage nurse.   

## 2016-03-17 ENCOUNTER — Encounter: Payer: Self-pay | Admitting: Medical

## 2016-03-17 ENCOUNTER — Telehealth: Payer: Self-pay | Admitting: *Deleted

## 2016-03-17 ENCOUNTER — Ambulatory Visit (INDEPENDENT_AMBULATORY_CARE_PROVIDER_SITE_OTHER): Payer: Medicaid Other | Admitting: Medical

## 2016-03-17 VITALS — BP 106/64 | HR 87 | Temp 98.2°F | Wt 212.2 lb

## 2016-03-17 DIAGNOSIS — R21 Rash and other nonspecific skin eruption: Secondary | ICD-10-CM | POA: Diagnosis not present

## 2016-03-17 DIAGNOSIS — H6500 Acute serous otitis media, unspecified ear: Secondary | ICD-10-CM | POA: Insufficient documentation

## 2016-03-17 DIAGNOSIS — E118 Type 2 diabetes mellitus with unspecified complications: Secondary | ICD-10-CM | POA: Diagnosis not present

## 2016-03-17 DIAGNOSIS — H6501 Acute serous otitis media, right ear: Secondary | ICD-10-CM

## 2016-03-17 DIAGNOSIS — H9201 Otalgia, right ear: Secondary | ICD-10-CM | POA: Diagnosis not present

## 2016-03-17 NOTE — Progress Notes (Signed)
Subjective: Chief Complaint  Patient presents with  . rt ear ache    rt earache x 1 week    Here for right ear pain.   Here with wife.   He continues on cancer therapy with Gemcitabine and carboplatin.  He notes 1 week hx/o right ear pressure and discomfort, hearing seems muffled.   However, he denies headache, sinus pain, lymph node enlargement in neck, no fever, no nausea, no post nasal drainage, no cough, no sore throat.   No ear drainage.   He does have grandchildren he has been around, and they have had stomach virus but no respiratory symptoms.   Denies teeth pain, no hx/o TMJ issues.     He does have some rash that has been popping up on torso, red/brown bumps in the last few weeks.  No other aggravating or relieving factors. No other complaint.  Still taking metformin, not taking glipizide.  Checks sugars some.    Past Medical History:  Diagnosis Date  . Cancer ([place])   . Dental caries   . Diabetes mellitus, type II ([place]) 01/2012  . Hyperlipidemia   . Influenza vaccine refused 01/18/2013  . Kidney cancer, primary, with metastasis from kidney to other site [facility])   . Obesity   . Pneumococcal vaccine refused 01/18/2013  . Scoliosis   . Tobacco use   . Vision impairment    left, due to prior trauma   Current Outpatient Prescriptions on File Prior to Visit  Medication Sig Dispense Refill  . carvedilol (COREG) 3.125 MG tablet Take 1 tablet (3.125 mg total) by mouth 2 (two) times daily with a meal. 180 tablet 0  . cyanocobalamin (,VITAMIN B-12,) 1000 MCG/ML injection Please take intramuscular once weekly for 4 weeks, then change to once monthly after that 1 mL 12  . metFORMIN (GLUCOPHAGE) 500 MG tablet Take 1 tablet (500 mg total) by mouth 2 (two) times daily with a meal. 180 tablet 1  . chlorproMAZINE (THORAZINE) 50 MG tablet Take 1 tablet (50 mg total) by mouth 3 (three) times daily as needed for hiccoughs. 90 tablet 0  . clonazePAM (KLONOPIN) 0.5 MG tablet TAKE 1 TABLET BY MOUTH  1-2 TIMES DAILY AS NEEDED FOR ANXIETY. MAY CAUSE SEDATION (Patient not taking: Reported on 03/17/2016) 15 tablet 0  . ferrous sulfate 325 (65 FE) MG tablet Take 1 tablet (325 mg total) by mouth 2 (two) times daily with a meal. (Patient not taking: Reported on 03/17/2016) 60 tablet 3  . glipiZIDE (GLUCOTROL) 5 MG tablet Take 1 tablet (5 mg total) by mouth daily before breakfast. (Patient not taking: Reported on 03/17/2016) 30 tablet 2  . HYDROcodone-acetaminophen (NORCO/VICODIN) 5-325 MG tablet Take 1 tablet by mouth every 6 (six) hours as needed for moderate pain. (Patient not taking: Reported on 03/17/2016) 15 tablet 0  . lidocaine-prilocaine (EMLA) cream Apply 1 application topically as needed. 30 g 0  . megestrol (MEGACE) 400 MG/10ML suspension Take 10 mLs (400 mg total) by mouth 2 (two) times daily. (Patient not taking: Reported on 03/17/2016) 240 mL 5  . pantoprazole (PROTONIX) 40 MG tablet Take 1 tablet (40 mg total) by mouth daily at 6 (six) AM. (Patient not taking: Reported on 03/17/2016) 30 tablet 3  . prochlorperazine (COMPAZINE) 10 MG tablet Take 1 tablet (10 mg total) by mouth every 6 (six) hours as needed for nausea or vomiting. (Patient not taking: Reported on 03/17/2016) 30 tablet 1   No current facility-administered medications on file prior to visit.  ROS as in subjective   Objective: BP 106/64   Pulse 87   Temp 98.2 F (36.8 C)   Wt 212 lb 3.2 oz (96.3 kg)   SpO2 99%   BMI 31.34 kg/m   General appearance: alert, no distress, WD/WN HEENT: normocephalic, sclerae anicteric, right TM with serous effusion, left TM pearly, nares patent, no discharge or erythema, pharynx normal Oral cavity: MMM,, left upper jaw with some obvious decay of teeth, otherwise in good repair Neck: supple, no lymphadenopathy, no thyromegaly, no masses Skin: scattered nonspecific red/brown round papular 2-3 mm raised scattered lesions on torso, anterior and posterior    Assessment: Encounter Diagnoses   Name Primary?  . Right acute serous otitis media, recurrence not specified   . Right ear pain Yes  . Rash   . Type 2 diabetes mellitus with complication, without long-term current use of insulin (HCC)       Plan: Ear pain - only obvious cause is serous OM.  C/t hydration with water throughout the day, can use 5-7 day course of benadryl or possibly Sudafed, but cautions due to potential to elevated BP with sudafed.    He will try benadryl first.  If worse or not improving, can call back and we will use round of antibiotic for OM if not improving.     Rash - possibly related to chemotherapy.  Will ask Dr. Alen Blew.  Could be folicultiis, but seems more drug related.     Diabetes - c/t metformin, monitoring more than he is doing, and will ask cancer center to add HgbA1C upon next lab draw.  He declines lab or fingerstick draw today.  David [name] was seen today for rt ear ache.  Diagnoses and all orders for this visit:  Right ear pain  Right acute serous otitis media, recurrence not specified  Rash  Type 2 diabetes mellitus with complication, without long-term current use of insulin (HCC) -     Hemoglobin A1c; Future

## 2016-03-17 NOTE — Telephone Encounter (Signed)
As noted below, I called and spoke to the wife. Wife stated, "we are going to see his PCP at 12:00 today. Thanks for calling."

## 2016-03-17 NOTE — Telephone Encounter (Signed)
Returned call.  Message left that yes, tenderness is common with hair loss.  PCP is great choice for evaluation of  The ear which doubtfully related to Carboplatin/Gemzar treatments.

## 2016-03-17 NOTE — Telephone Encounter (Signed)
FYI "This is Venette calling for my husband David [name].  He has a problem with his ear.  Started over the weekend.  Not sure if he need to see family doctor or see Dr. Alen Blew.  problem with his head.  His head is real sensitive.  Hair is coming out so we use cream as you all advised.  It all is on the same side.  He needs to be seen.  I will also call the PCP now."

## 2016-03-18 ENCOUNTER — Telehealth: Payer: Self-pay | Admitting: Medical

## 2016-03-18 ENCOUNTER — Other Ambulatory Visit: Payer: Self-pay | Admitting: Medical

## 2016-03-18 ENCOUNTER — Other Ambulatory Visit (HOSPITAL_BASED_OUTPATIENT_CLINIC_OR_DEPARTMENT_OTHER): Payer: Medicaid Other

## 2016-03-18 ENCOUNTER — Other Ambulatory Visit: Payer: Self-pay | Admitting: Oncology

## 2016-03-18 ENCOUNTER — Ambulatory Visit: Payer: Medicaid Other

## 2016-03-18 DIAGNOSIS — C652 Malignant neoplasm of left renal pelvis: Secondary | ICD-10-CM | POA: Diagnosis not present

## 2016-03-18 DIAGNOSIS — C642 Malignant neoplasm of left kidney, except renal pelvis: Secondary | ICD-10-CM | POA: Diagnosis not present

## 2016-03-18 DIAGNOSIS — C659 Malignant neoplasm of unspecified renal pelvis: Secondary | ICD-10-CM

## 2016-03-18 DIAGNOSIS — Z131 Encounter for screening for diabetes mellitus: Secondary | ICD-10-CM

## 2016-03-18 LAB — CBC WITH DIFFERENTIAL/PLATELET
BASO%: 0.8 % (ref 0.0–2.0)
Basophils Absolute: 0 10*3/uL (ref 0.0–0.1)
EOS%: 0.4 % (ref 0.0–7.0)
Eosinophils Absolute: 0 10*3/uL (ref 0.0–0.5)
HCT: 25.3 % — ABNORMAL LOW (ref 38.4–49.9)
HGB: 8.5 g/dL — ABNORMAL LOW (ref 13.0–17.1)
LYMPH%: 52.1 % — ABNORMAL HIGH (ref 14.0–49.0)
MCH: 31.8 pg (ref 27.2–33.4)
MCHC: 33.4 g/dL (ref 32.0–36.0)
MCV: 95.1 fL (ref 79.3–98.0)
MONO#: 0.1 10*3/uL (ref 0.1–0.9)
MONO%: 4.4 % (ref 0.0–14.0)
NEUT%: 42.3 % (ref 39.0–75.0)
NEUTROS ABS: 1.1 10*3/uL — AB (ref 1.5–6.5)
Platelets: 22 10*3/uL — ABNORMAL LOW (ref 140–400)
RBC: 2.66 10*6/uL — ABNORMAL LOW (ref 4.20–5.82)
RDW: 19.6 % — AB (ref 11.0–14.6)
WBC: 2.5 10*3/uL — ABNORMAL LOW (ref 4.0–10.3)
lymph#: 1.3 10*3/uL (ref 0.9–3.3)

## 2016-03-18 LAB — COMPREHENSIVE METABOLIC PANEL
ALT: 15 U/L (ref 0–55)
AST: 17 U/L (ref 5–34)
Albumin: 3 g/dL — ABNORMAL LOW (ref 3.5–5.0)
Alkaline Phosphatase: 76 U/L (ref 40–150)
Anion Gap: 7 mEq/L (ref 3–11)
BILIRUBIN TOTAL: 0.36 mg/dL (ref 0.20–1.20)
BUN: 21 mg/dL (ref 7.0–26.0)
CO2: 27 meq/L (ref 22–29)
CREATININE: 0.9 mg/dL (ref 0.7–1.3)
Calcium: 8.8 mg/dL (ref 8.4–10.4)
Chloride: 108 mEq/L (ref 98–109)
GLUCOSE: 86 mg/dL (ref 70–140)
Potassium: 4.4 mEq/L (ref 3.5–5.1)
SODIUM: 142 meq/L (ref 136–145)
TOTAL PROTEIN: 5.7 g/dL — AB (ref 6.4–8.3)

## 2016-03-18 LAB — HEMOGLOBIN A1C
ESTIMATED AVERAGE GLUCOSE: 111 mg/dL
Hemoglobin A1c: 5.5 % (ref 4.8–5.6)

## 2016-03-18 MED ORDER — MUPIROCIN 2 % EX OINT: 1.0000 "application " | TOPICAL_OINTMENT | Freq: Three times a day (TID) | CUTANEOUS | 0 refills | Status: DC

## 2016-03-18 NOTE — Telephone Encounter (Signed)
Have him begin mupirocin ointment to the rash for the next several days to see if this helps the rash

## 2016-03-18 NOTE — Progress Notes (Signed)
Per Dr. Alen Blew, no platelets or blood today.

## 2016-03-18 NOTE — Telephone Encounter (Signed)
His had the rash for about the 1 month , no itching, burning, no pain , has not use anything for it

## 2016-03-18 NOTE — Telephone Encounter (Signed)
Call him back about the rash.  I spoke to Dr. Alen Blew and he didn't think it was related to chemo.  How long has he had the rash? Does it itch? Does it burn? Does it feel painful like infected or angry? What has he put on it and did it help?  Let me know.

## 2016-03-19 ENCOUNTER — Encounter (HOSPITAL_COMMUNITY): Payer: Self-pay | Admitting: Emergency Medicine

## 2016-03-19 ENCOUNTER — Other Ambulatory Visit: Payer: Self-pay

## 2016-03-19 ENCOUNTER — Emergency Department (HOSPITAL_COMMUNITY)
Admission: EM | Admit: 2016-03-19 | Discharge: 2016-03-20 | Disposition: A | Discharge status: Home / Self Care | Payer: Medicaid Other | Attending: Emergency Medicine | Admitting: Emergency Medicine

## 2016-03-19 ENCOUNTER — Emergency Department (HOSPITAL_COMMUNITY): Payer: Medicaid Other

## 2016-03-19 DIAGNOSIS — R0689 Other abnormalities of breathing: Secondary | ICD-10-CM | POA: Insufficient documentation

## 2016-03-19 DIAGNOSIS — D649 Anemia, unspecified: Secondary | ICD-10-CM | POA: Diagnosis not present

## 2016-03-19 DIAGNOSIS — Z87891 Personal history of nicotine dependence: Secondary | ICD-10-CM | POA: Diagnosis not present

## 2016-03-19 DIAGNOSIS — D696 Thrombocytopenia, unspecified: Secondary | ICD-10-CM | POA: Insufficient documentation

## 2016-03-19 DIAGNOSIS — Z859 Personal history of malignant neoplasm, unspecified: Secondary | ICD-10-CM | POA: Diagnosis not present

## 2016-03-19 DIAGNOSIS — M7989 Other specified soft tissue disorders: Secondary | ICD-10-CM

## 2016-03-19 DIAGNOSIS — Z794 Long term (current) use of insulin: Secondary | ICD-10-CM | POA: Diagnosis not present

## 2016-03-19 DIAGNOSIS — E119 Type 2 diabetes mellitus without complications: Secondary | ICD-10-CM | POA: Diagnosis not present

## 2016-03-19 DIAGNOSIS — Z79899 Other long term (current) drug therapy: Secondary | ICD-10-CM | POA: Diagnosis not present

## 2016-03-19 LAB — CK: CK TOTAL: 572 U/L — AB (ref 49–397)

## 2016-03-19 LAB — CBC WITH DIFFERENTIAL/PLATELET
Basophils Absolute: 0 10*3/uL (ref 0.0–0.1)
Basophils Relative: 0 %
Eosinophils Absolute: 0.1 10*3/uL (ref 0.0–0.7)
Eosinophils Relative: 2 %
HEMATOCRIT: 21.6 % — AB (ref 39.0–52.0)
HEMOGLOBIN: 7.3 g/dL — AB (ref 13.0–17.0)
Lymphocytes Relative: 32 %
Lymphs Abs: 1.4 10*3/uL (ref 0.7–4.0)
MCH: 31.7 pg (ref 26.0–34.0)
MCHC: 33.8 g/dL (ref 30.0–36.0)
MCV: 93.9 fL (ref 78.0–100.0)
MONOS PCT: 8 %
Monocytes Absolute: 0.4 10*3/uL (ref 0.1–1.0)
NEUTROS PCT: 58 %
Neutro Abs: 2.5 10*3/uL (ref 1.7–7.7)
Platelets: 15 10*3/uL — CL (ref 150–400)
RBC: 2.3 MIL/uL — AB (ref 4.22–5.81)
RDW: 17.7 % — ABNORMAL HIGH (ref 11.5–15.5)
WBC: 4.4 10*3/uL (ref 4.0–10.5)

## 2016-03-19 LAB — COMPREHENSIVE METABOLIC PANEL
ALT: 21 U/L (ref 17–63)
AST: 31 U/L (ref 15–41)
Albumin: 3.4 g/dL — ABNORMAL LOW (ref 3.5–5.0)
Alkaline Phosphatase: 77 U/L (ref 38–126)
Anion gap: 7 (ref 5–15)
BILIRUBIN TOTAL: 0.4 mg/dL (ref 0.3–1.2)
BUN: 16 mg/dL (ref 6–20)
CHLORIDE: 106 mmol/L (ref 101–111)
CO2: 27 mmol/L (ref 22–32)
CREATININE: 0.97 mg/dL (ref 0.61–1.24)
Calcium: 8.4 mg/dL — ABNORMAL LOW (ref 8.9–10.3)
GFR calc Af Amer: >60 mL/min (ref >60)
Glucose, Bld: 121 mg/dL — ABNORMAL HIGH (ref 65–99)
Potassium: 3.8 mmol/L (ref 3.5–5.1)
Sodium: 140 mmol/L (ref 135–145)
TOTAL PROTEIN: 5.9 g/dL — AB (ref 6.5–8.1)

## 2016-03-19 LAB — I-STAT TROPONIN, ED: Troponin i, poc: 0 ng/mL (ref 0.00–0.08)

## 2016-03-19 LAB — BRAIN NATRIURETIC PEPTIDE: B Natriuretic Peptide: 53.9 pg/mL (ref 0.0–100.0)

## 2016-03-19 NOTE — Telephone Encounter (Signed)
Spoke with pt and gave him the name meds.

## 2016-03-19 NOTE — ED Notes (Signed)
Pt pulse while ambulating stayed in 101-103 range, and O2 stayed at 100. Pt states that he "felt fine".

## 2016-03-19 NOTE — ED Triage Notes (Signed)
Pt comes in with complaints of bilateral leg swelling starting tonight . Also complaining of right elbow pain that he is unsure why it is sore. Pt is a cancer patient and pt's last treatment was the 23rd. Pt has renal cancer that has metastasized to his right lower lung. Pulses in tact in both legs. +1 non pitting edema noted in both legs.  No redness or skin break down noted.

## 2016-03-19 NOTE — ED Provider Notes (Signed)
[facility] DEPT Provider Note   CSN: [id number] Arrival date & time: 03/19/16  1853  By signing my name below, I, Dora Sims, attest that this documentation has been prepared under the direction and in the presence of Margarita Mail, PA-C. Electronically Signed: Dora Sims, Scribe. 03/19/2016. 8:31 PM.  History   Chief Complaint Chief Complaint  Patient presents with  . Bilateral Leg Swelling    The history is provided by the patient. No language interpreter was used.     HPI Comments: David [name] is a 56 y.o. male with PMHx significant for DM type II, HLD, SIRS, and renal cancer who presents to the Emergency Department complaining of constant bilateral leg swelling beginning earlier today. Pt also reports some recent dyspnea on exertion for the past several days. No h/o the same in the past. Pt is in chemotherapy for renal cancer and his last treatment was on 1/23; he is scheduled to attend chemotherapy until the end of February. He states he saw his PCP 2 days ago for right ear pain and was told he had fluid in the ear. Pt is a former smoker. He has a h/o HLD but denies other heart problems. Pt denies abdominal distention, fever, chills, congestion, cough, or any other associated symptoms.  He is also complaining of constant right elbow/right triceps pain beginning this morning. No recent trauma to his right elbow. He denies swelling. No other complaints at this time.   Past Medical History:  Diagnosis Date  . Cancer ([place])   . Dental caries   . Diabetes mellitus, type II ([place]) 01/2012  . Hyperlipidemia   . Influenza vaccine refused 01/18/2013  . Kidney cancer, primary, with metastasis from kidney to other site [facility])   . Obesity   . Pneumococcal vaccine refused 01/18/2013  . Scoliosis   . Tobacco use   . Vision impairment    left, due to prior trauma    Patient Active Problem List   Diagnosis Date Noted  . Acute serous otitis media 03/17/2016  . Port catheter in  place 12/11/2015  . Protein-calorie malnutrition, severe 11/15/2015  . Hyponatremia 11/14/2015  . Hypoalbuminemia due to protein-calorie malnutrition ([place]) 11/14/2015  . Generalized weakness 11/14/2015  . Mild dehydration 11/14/2015  . SIRS (systemic inflammatory response syndrome) ([facility])   . S/P thoracentesis   . Fever, unspecified 10/25/2015  . Pleural effusion on right 10/25/2015  . Pleural mass 10/25/2015  . Symptomatic anemia 10/24/2015  . Primary cancer of renal pelvis ([facility]) 10/18/2015  . Abnormal CT of the chest 10/03/2015  . Screening for prostate cancer 10/03/2015  . Creatinine elevation 10/03/2015  . Pulmonary nodules 10/03/2015  . Loss of weight 10/03/2015  . Lymphadenopathy 10/03/2015  . History of colonic polyps 10/03/2015  . Leukocytosis 10/03/2015  . Anemia, unspecified 10/03/2015  . Former smoker 10/03/2015  . Type 2 diabetes mellitus with complication, without long-term current use of insulin ([place]) 01/18/2013  . Obesity, unspecified 01/18/2013    Past Surgical History:  Procedure Laterality Date  . COLONOSCOPY W/ POLYPECTOMY    . EYE SURGERY  1996   due to trauma, left eye  . IR GENERIC HISTORICAL  11/26/2015   IR US GUIDE VASC ACCESS RIGHT 11/26/2015 [facility]  . IR GENERIC HISTORICAL  11/26/2015   IR FLUORO GUIDE PORT INSERTION RIGHT 11/26/2015 [facility]       Home Medications    Prior to Admission medications   Medication Sig Start Date End Date Taking? Authorizing Provider  carvedilol (  COREG) 3.125 MG tablet Take 1 tablet (3.125 mg total) by mouth 2 (two) times daily with a meal. 11/27/15   Camelia Eng Tysinger, PA-C  chlorproMAZINE (THORAZINE) 50 MG tablet Take 1 tablet (50 mg total) by mouth 3 (three) times daily as needed for hiccoughs. 10/28/15   Debbe Odea, MD  clonazePAM (KLONOPIN) 0.5 MG tablet TAKE 1 TABLET BY MOUTH 1-2 TIMES DAILY AS NEEDED FOR ANXIETY. MAY CAUSE SEDATION Patient not taking: Reported on 03/17/2016 11/28/15   Wyatt Portela,  MD  cyanocobalamin (,VITAMIN B-12,) 1000 MCG/ML injection Please take intramuscular once weekly for 4 weeks, then change to once monthly after that 11/15/15   Albertine Patricia, MD  ferrous sulfate 325 (65 FE) MG tablet Take 1 tablet (325 mg total) by mouth 2 (two) times daily with a meal. Patient not taking: Reported on 03/17/2016 11/29/15   Camelia Eng Tysinger, PA-C  glipiZIDE (GLUCOTROL) 5 MG tablet Take 1 tablet (5 mg total) by mouth daily before breakfast. Patient not taking: Reported on 03/17/2016 11/29/15   Camelia Eng Tysinger, PA-C  HYDROcodone-acetaminophen (NORCO/VICODIN) 5-325 MG tablet Take 1 tablet by mouth every 6 (six) hours as needed for moderate pain. Patient not taking: Reported on 03/17/2016 11/03/15   Antonietta Breach, PA-C  lidocaine-prilocaine (EMLA) cream Apply 1 application topically as needed. 11/27/15   Wyatt Portela, MD  megestrol (MEGACE) 400 MG/10ML suspension Take 10 mLs (400 mg total) by mouth 2 (two) times daily. Patient not taking: Reported on 03/17/2016 11/27/15   Camelia Eng Tysinger, PA-C  metFORMIN (GLUCOPHAGE) 500 MG tablet Take 1 tablet (500 mg total) by mouth 2 (two) times daily with a meal. 01/29/16   Camelia Eng Tysinger, PA-C  mupirocin ointment (BACTROBAN) 2 % Place 1 application into the nose 3 (three) times daily. 03/18/16   Camelia Eng Tysinger, PA-C  pantoprazole (PROTONIX) 40 MG tablet Take 1 tablet (40 mg total) by mouth daily at 6 (six) AM. Patient not taking: Reported on 03/17/2016 11/16/15   Silver Huguenin Elgergawy, MD  prochlorperazine (COMPAZINE) 10 MG tablet Take 1 tablet (10 mg total) by mouth every 6 (six) hours as needed for nausea or vomiting. Patient not taking: Reported on 03/17/2016 10/29/15   Maryanna Shape, NP    Family History Family History  Problem Relation Age of Onset  . Diabetes Sister   . Other Sister     gastric bypass  . Hyperlipidemia Mother   . Diabetes Mother   . Cancer Father 51    prostate cancer  . Stroke Maternal Grandmother   . Heart  disease Neg Hx   . Hypertension Neg Hx   . Colon cancer Neg Hx     Social History Social History  Substance Use Topics  . Smoking status: Former Smoker    Packs/day: 0.25    Years: 20.00    Types: Cigarettes  . Smokeless tobacco: Never Used  . Alcohol use No     Allergies   Patient has no known allergies.   Review of Systems Review of Systems  A complete 10 system review of systems was obtained and all systems are negative except as noted in the HPI and PMH.   Physical Exam Updated Vital Signs BP 130/83 (BP Location: Left Arm)   Pulse 93   Temp 98.3 F (36.8 C) (Oral)   Resp 18   SpO2 100%   Physical Exam  Constitutional: He is oriented to person, place, and time. He appears well-developed and well-nourished. No distress.  HENT:  Head: Normocephalic and atraumatic.  Right Ear: A middle ear effusion is present.  Left Ear: Tympanic membrane normal.  Eyes: Conjunctivae and EOM are normal.  Neck: Neck supple. No tracheal deviation present.  Cardiovascular: Normal rate and regular rhythm.   Pulmonary/Chest: Effort normal. No respiratory distress.  Lungs are CTA.  Abdominal: Soft. There is no tenderness.  Musculoskeletal: Normal range of motion. He exhibits edema.  2+ pitting edema to the mid-shaft of his bilateral lower extremities. Tenderness to the right triceps. Normal ROM of right shoulder and elbow. No epitrochlear lymphadenopathy.  Lymphadenopathy:       Head (right side): Tonsillar adenopathy present.  Neurological: He is alert and oriented to person, place, and time.  Skin: Skin is warm and dry.  Psychiatric: He has a normal mood and affect. His behavior is normal.  Nursing note and vitals reviewed.    ED Treatments / Results  Labs (all labs ordered are listed, but only abnormal results are displayed) Labs Reviewed - No data to display  EKG  EKG Interpretation None       Radiology No results found.  Procedures Procedures (including  critical care time)  DIAGNOSTIC STUDIES: Oxygen Saturation is 100% on RA, normal by my interpretation.    COORDINATION OF CARE: 8:48 PM Discussed treatment plan with pt at bedside and pt agreed to plan.  Medications Ordered in ED Medications - No data to display   Initial Impression / Assessment and Plan / ED Course  I have reviewed the triage vital signs and the nursing notes.  Pertinent labs & imaging results that were available during my care of the patient were reviewed by me and considered in my medical decision making (see chart for details).  Clinical Course as of Mar 25 1429  Wed Mar 19, 2016  2207 Hemoglobin: (!) 7.3 [AH]  2208 Hgb and platelets noted to be markedly low Platelets: (!!) 15 [AH]    Clinical Course User Index [AH] Margarita Mail, PA-C    Patient with unspecii  Final Clinical Impressions(s) / ED Diagnoses   Final diagnoses:  Anemia, unspecified type  Thrombocytopenia (HCC)  Leg swelling    New Prescriptions New Prescriptions   No medications on file   I personally performed the services described in this documentation, which was scribed in my presence. The recorded information has been reviewed and is accurate.      Margarita Mail, PA-C 03/27/16 Dodson Liu, MD 03/27/16 [phone]

## 2016-03-20 ENCOUNTER — Telehealth: Payer: Self-pay | Admitting: Oncology

## 2016-03-20 ENCOUNTER — Encounter: Payer: Self-pay | Admitting: *Deleted

## 2016-03-20 ENCOUNTER — Ambulatory Visit (HOSPITAL_BASED_OUTPATIENT_CLINIC_OR_DEPARTMENT_OTHER): Payer: Medicaid Other

## 2016-03-20 ENCOUNTER — Other Ambulatory Visit: Payer: Self-pay | Admitting: *Deleted

## 2016-03-20 ENCOUNTER — Ambulatory Visit (HOSPITAL_COMMUNITY)
Admission: RE | Admit: 2016-03-20 | Discharge: 2016-03-20 | Disposition: A | Discharge status: Home / Self Care | Payer: Medicaid Other | Source: Ambulatory Visit | Attending: Oncology | Admitting: Oncology

## 2016-03-20 ENCOUNTER — Ambulatory Visit (HOSPITAL_BASED_OUTPATIENT_CLINIC_OR_DEPARTMENT_OTHER): Payer: Medicaid Other | Admitting: Oncology

## 2016-03-20 VITALS — BP 165/82 | HR 103 | Temp 98.0°F | Resp 18 | Ht 69.0 in | Wt 215.9 lb

## 2016-03-20 DIAGNOSIS — R600 Localized edema: Secondary | ICD-10-CM | POA: Diagnosis not present

## 2016-03-20 DIAGNOSIS — D649 Anemia, unspecified: Secondary | ICD-10-CM

## 2016-03-20 DIAGNOSIS — C659 Malignant neoplasm of unspecified renal pelvis: Secondary | ICD-10-CM

## 2016-03-20 DIAGNOSIS — C642 Malignant neoplasm of left kidney, except renal pelvis: Secondary | ICD-10-CM

## 2016-03-20 DIAGNOSIS — C782 Secondary malignant neoplasm of pleura: Secondary | ICD-10-CM

## 2016-03-20 DIAGNOSIS — D509 Iron deficiency anemia, unspecified: Secondary | ICD-10-CM | POA: Insufficient documentation

## 2016-03-20 DIAGNOSIS — D630 Anemia in neoplastic disease: Secondary | ICD-10-CM | POA: Diagnosis not present

## 2016-03-20 MED ORDER — HYDROCODONE-ACETAMINOPHEN 5-325 MG PO TABS: 1.0000 | ORAL_TABLET | Freq: Four times a day (QID) | ORAL | 0 refills | Status: DC | PRN

## 2016-03-20 MED ORDER — HEPARIN SOD (PORK) LOCK FLUSH 100 UNIT/ML IV SOLN
500.0000 [IU] | Freq: Once | INTRAVENOUS | Status: AC
  Administered 2016-03-20: 500 [IU]
  Filled 2016-03-20: qty 5

## 2016-03-20 NOTE — ED Provider Notes (Signed)
Medical screening examination/treatment/procedure(s) were conducted as a shared visit with non-physician practitioner(s) and myself.  I personally evaluated the patient during the encounter.   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 Has had associating fatigue. Reports soreness in bilateral upper arms as well but no significant swelling. No chest pain, cough, dyspnea more than baseline, fever, chills. No orthopnea or PND.  On exam, with mild symmetric edema. Does not appear significantly fluid overloaded. BNP normal, CXR visualized and without acute cardiopulmonary processes. No significant CHF symptoms. No tachypnea, hypoxia, dyspnea, and with symmetric trace edema. No concerns for PE/DVT at this time.   Blood work concerning for worsening anemia and thrombocytopenia, felt to be likely related to chemotherapy agents. Patient was discussed with Dr. Alen Blew, patient's oncologist, who felt patient can likely be transfused as outpatient from cancer center given fairly asymptomatic. Will arrange follow-up in one day in oncology clinic.    Forde Dandy, MD 03/20/16 [phone]

## 2016-03-20 NOTE — Telephone Encounter (Signed)
Patient was given a copy of the AVS report and appointment schedule, per 03/20/16 los.

## 2016-03-20 NOTE — Progress Notes (Signed)
Hematology and Oncology Follow Up Visit  David [name] [id number] [birth year] 56 y.o. 03/20/2016 4:16 PM  Principle Diagnosis: 56 year old gentleman with metastatic carcinoma arising from the left kidney and the renal pelvis. He presented with a 5 cm mass in the central left kidney as well as pulmonary metastasis, nodules and pleural effusion.   Prior Therapy: Status post biopsy obtained on 10/12/2015 which confirmed the presence of carcinoma. 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  Current therapy: Chemotherapy with gemcitabine and carboplatin cycle one  on 10/30/2015. He is status post 7 cycles of therapy. Day 8 of cycle 7 scheduled for February 6.  Interim History: David [name] presents today for a follow-up visit. Since the last visit, he was seen in the emergency department after a recent complaints of lower extremity edema and fatigue. His evaluation the emergency department was unrevealing including workup that included chest x-ray, EKG and laboratory testing.   Overall, he reports feeling reasonably fair without any chest pain or difficulty breathing. He reports he is eating well and no difficulties with moving his bowels or constipation. He denied any urination difficulties.  He denied any headaches, blurry vision, syncope or seizures. He does report fevers but no chills.He does not report any chest pain, palpitation, orthopnea, leg edema or dyspnea on exertion. He denied any cough, wheezing or hemoptysis. He does not report any nausea, vomiting or early satiety. He did report constipation which have resolved. He denied any diarrhea. He does not report any frequency, urgency or hesitancy. He does not report any skeletal complaints. Remaining review of system is unremarkable.   Medications: I have reviewed the patient's current medications.  Current Outpatient Prescriptions  Medication Sig Dispense Refill  . carvedilol (COREG) 3.125 MG tablet Take 1  tablet (3.125 mg total) by mouth 2 (two) times daily with a meal. 180 tablet 0  . chlorproMAZINE (THORAZINE) 50 MG tablet Take 1 tablet (50 mg total) by mouth 3 (three) times daily as needed for hiccoughs. (Patient not taking: Reported on 03/19/2016) 90 tablet 0  . clonazePAM (KLONOPIN) 0.5 MG tablet TAKE 1 TABLET BY MOUTH 1-2 TIMES DAILY AS NEEDED FOR ANXIETY. MAY CAUSE SEDATION (Patient not taking: Reported on 03/17/2016) 15 tablet 0  . cyanocobalamin (,VITAMIN B-12,) 1000 MCG/ML injection Please take intramuscular once weekly for 4 weeks, then change to once monthly after that (Patient not taking: Reported on 03/19/2016) 1 mL 12  . diphenhydrAMINE (BENADRYL) 25 mg capsule Take 25 mg by mouth every 6 (six) hours as needed for allergies.    . ferrous sulfate 325 (65 FE) MG tablet Take 1 tablet (325 mg total) by mouth 2 (two) times daily with a meal. (Patient not taking: Reported on 03/17/2016) 60 tablet 3  . glipiZIDE (GLUCOTROL) 5 MG tablet Take 1 tablet (5 mg total) by mouth daily before breakfast. (Patient not taking: Reported on 03/17/2016) 30 tablet 2  . HYDROcodone-acetaminophen (NORCO/VICODIN) 5-325 MG tablet Take 1 tablet by mouth every 6 (six) hours as needed for moderate pain. 30 tablet 0  . lidocaine-prilocaine (EMLA) cream Apply 1 application topically as needed. 30 g 0  . megestrol (MEGACE) 400 MG/10ML suspension Take 10 mLs (400 mg total) by mouth 2 (two) times daily. (Patient not taking: Reported on 03/17/2016) 240 mL 5  . metFORMIN (GLUCOPHAGE) 500 MG tablet Take 1 tablet (500 mg total) by mouth 2 (two) times daily with a meal. 180 tablet 1  . mupirocin ointment (BACTROBAN) 2 % Place 1 application  into the nose 3 (three) times daily. 22 g 0  . pantoprazole (PROTONIX) 40 MG tablet Take 1 tablet (40 mg total) by mouth daily at 6 (six) AM. (Patient not taking: Reported on 03/17/2016) 30 tablet 3  . prochlorperazine (COMPAZINE) 10 MG tablet Take 1 tablet (10 mg total) by mouth every 6 (six) hours  as needed for nausea or vomiting. (Patient not taking: Reported on 03/17/2016) 30 tablet 1   No current facility-administered medications for this visit.      Allergies: No Known Allergies  Past Medical History, Surgical history, Social history, and Family History were reviewed and updated.   Physical Exam: Blood pressure (!) 165/82, pulse (!) 103, temperature 98 F (36.7 C), temperature source Oral, resp. rate 18, height 5\' 9"  (1.753 m), weight 215 lb 14.4 oz (97.9 kg), SpO2 100 %. ECOG: 1 General appearance: Well-appearing gentleman without distress. Head: Normocephalic, without obvious abnormality no oral thrush noted. Neck: no adenopathy Lymph nodes: Cervical, supraclavicular, and axillary nodes normal. Heart:regular rate and rhythm, S1, S2 normal, no murmur, click, rub or gallop Lung:chest clear, no wheezing, rales, normal symmetric air entry Abdomin: soft, non-tender, without masses or organomegaly no rebound or guarding. [id number] lower extremity edema bilaterally.   Lab Results: Lab Results  Component Value Date   WBC 4.4 03/19/2016   HGB 7.3 (L) 03/19/2016   HCT 21.6 (L) 03/19/2016   MCV 93.9 03/19/2016   PLT 15 (LL) 03/19/2016     Chemistry      Component Value Date/Time   NA 140 03/19/2016 2120   NA 142 03/18/2016 1120   K 3.8 03/19/2016 2120   K 4.4 03/18/2016 1120   CL 106 03/19/2016 2120   CO2 27 03/19/2016 2120   CO2 27 03/18/2016 1120   BUN 16 03/19/2016 2120   BUN 21.0 03/18/2016 1120   CREATININE 0.97 03/19/2016 2120   CREATININE 0.9 03/18/2016 1120      Component Value Date/Time   CALCIUM 8.4 (L) 03/19/2016 2120   CALCIUM 8.8 03/18/2016 1120   ALKPHOS 77 03/19/2016 2120   ALKPHOS 76 03/18/2016 1120   AST 31 03/19/2016 2120   AST 17 03/18/2016 1120   ALT 21 03/19/2016 2120   ALT 15 03/18/2016 1120   BILITOT 0.4 03/19/2016 2120   BILITOT 0.36 03/18/2016 1120        Impression and Plan:  56 year old gentleman with the following  issues:  1. 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 He is status post biopsy of a pleural-based nodule which confirmed the presence of carcinoma although further immunohistochemical staining was not performed because of lack of tissue.  These findings confirm the presence of advanced malignancy Arising from the renal pelvis.  He is currently receiving salvage chemotherapy with carboplatin and gemcitabine. Chemotherapy has been well tolerated with dramatic improvement in his quality of life.  CT scan on 11/09/2017showed a positive response with decrease of the volume of disease at this time.  CT scan obtained on 03/03/2016 continues to show excellent response to therapy. The plan is to continue with the same chemotherapy regimen at this time.  2. IV access: Port-A-Cath has been utilized without complications.  3. Nausea prophylaxis: He has prescription for antiemetics available to him. No issues reported at this time.  4. Lower extremity edema: Very mild and does not appear to be a sign of a deep brain thrombosis. I recommended he continues to elevate his feet and  use compression stocking. We will consider using diuretics this becomes an issue. I see no evidence to suggest cardiac, renal or hepatic problems for his edema.  5. Anemia: Related to malignancy and chemotherapy infusion. He will continue to receive transfusion on 03/21/2016.  6. Follow-up: Will be on February 6 for his next chemotherapy treatment.  Zola Button, MD 2/1/20184:16 PM

## 2016-03-20 NOTE — Progress Notes (Signed)
Orders in for t x match today and transfuse 2 units p-rbc's 03/21/16 in infusion @ 9:30. Patient aware of date and time. Admitting called for a har.

## 2016-03-20 NOTE — Discharge Instructions (Signed)
Contact a health care provider if: You have unexplained bruising. Get help right away if: You have active bleeding from anywhere on your body. You have blood in your sputum, urine, or stool.

## 2016-03-21 ENCOUNTER — Ambulatory Visit (HOSPITAL_BASED_OUTPATIENT_CLINIC_OR_DEPARTMENT_OTHER): Payer: Medicaid Other

## 2016-03-21 VITALS — BP 159/81 | HR 63 | Temp 98.4°F | Resp 18

## 2016-03-21 DIAGNOSIS — D509 Iron deficiency anemia, unspecified: Secondary | ICD-10-CM | POA: Diagnosis not present

## 2016-03-21 DIAGNOSIS — Z95828 Presence of other vascular implants and grafts: Secondary | ICD-10-CM

## 2016-03-21 DIAGNOSIS — C642 Malignant neoplasm of left kidney, except renal pelvis: Secondary | ICD-10-CM | POA: Diagnosis present

## 2016-03-21 DIAGNOSIS — D630 Anemia in neoplastic disease: Secondary | ICD-10-CM | POA: Diagnosis not present

## 2016-03-21 DIAGNOSIS — D649 Anemia, unspecified: Secondary | ICD-10-CM

## 2016-03-21 DIAGNOSIS — C659 Malignant neoplasm of unspecified renal pelvis: Secondary | ICD-10-CM

## 2016-03-21 LAB — PREPARE RBC (CROSSMATCH)

## 2016-03-21 MED ORDER — SODIUM CHLORIDE 0.9% FLUSH
3.0000 mL | INTRAVENOUS | Status: DC | PRN
  Filled 2016-03-21: qty 10

## 2016-03-21 MED ORDER — SODIUM CHLORIDE 0.9 % IV SOLN
Freq: Once | INTRAVENOUS | Status: AC
  Administered 2016-03-21: 10:00:00 via INTRAVENOUS

## 2016-03-21 MED ORDER — ACETAMINOPHEN 325 MG PO TABS
ORAL_TABLET | ORAL | Status: AC
  Filled 2016-03-21: qty 2

## 2016-03-21 MED ORDER — SODIUM CHLORIDE 0.9% FLUSH
10.0000 mL | INTRAVENOUS | Status: AC | PRN
  Administered 2016-03-21: 10 mL
  Filled 2016-03-21: qty 10

## 2016-03-21 MED ORDER — HEPARIN SOD (PORK) LOCK FLUSH 100 UNIT/ML IV SOLN
500.0000 [IU] | Freq: Once | INTRAVENOUS | Status: AC | PRN
  Administered 2016-03-21: 500 [IU] via INTRAVENOUS
  Filled 2016-03-21: qty 5

## 2016-03-21 MED ORDER — ACETAMINOPHEN 325 MG PO TABS
650.0000 mg | ORAL_TABLET | Freq: Once | ORAL | Status: AC
  Administered 2016-03-21: 650 mg via ORAL

## 2016-03-21 MED ORDER — HEPARIN SOD (PORK) LOCK FLUSH 100 UNIT/ML IV SOLN
250.0000 [IU] | INTRAVENOUS | Status: DC | PRN
  Filled 2016-03-21: qty 5

## 2016-03-21 MED ORDER — DIPHENHYDRAMINE HCL 25 MG PO CAPS
25.0000 mg | ORAL_CAPSULE | Freq: Once | ORAL | Status: AC
  Administered 2016-03-21: 25 mg via ORAL

## 2016-03-21 MED ORDER — DIPHENHYDRAMINE HCL 25 MG PO CAPS
ORAL_CAPSULE | ORAL | Status: AC
  Filled 2016-03-21: qty 2

## 2016-03-21 NOTE — Patient Instructions (Signed)
Blood Transfusion , Adult A blood transfusion is a procedure in which you receive donated blood, including plasma, platelets, and red blood cells, through an IV tube. You may need a blood transfusion because of illness, surgery, or injury. The blood may come from a donor. You may also be able to donate blood for yourself (autologous blood donation) before a surgery if you know that you might require a blood transfusion. The blood given in a transfusion is made up of different types of cells. You may receive:  Red blood cells. These carry oxygen to the cells in the body.  White blood cells. These help you fight infections.  Platelets. These help your blood to clot.  Plasma. This is the liquid part of your blood and it helps with fluid imbalances. If you have hemophilia or another clotting disorder, you may also receive other types of blood products. Tell a health care provider about:  Any allergies you have.  All medicines you are taking, including vitamins, herbs, eye drops, creams, and over-the-counter medicines.  Any problems you or family members have had with anesthetic medicines.  Any blood disorders you have.  Any surgeries you have had.  Any medical conditions you have, including any recent fever or cold symptoms.  Whether you are pregnant or may be pregnant.  Any previous reactions you have had during a blood transfusion. What are the risks? Generally, this is a safe procedure. However, problems may occur, including:  Having an allergic reaction to something in the donated blood. Hives and itching may be symptoms of this type of reaction.  Fever. This may be a reaction to the white blood cells in the transfused blood. Nausea or chest pain may accompany a fever.  Iron overload. This can happen from having many transfusions.  Transfusion-related acute lung injury (TRALI). This is a rare reaction that causes lung damage. The cause is not known.TRALI can occur within hours  of a transfusion or several days later.  Sudden (acute) or delayed hemolytic reactions. This happens if your blood does not match the cells in your transfusion. Your body's defense system (immune system) may try to attack the new cells. This complication is rare. The symptoms include fever, chills, nausea, and low back pain or chest pain.  Infection or disease transmission. This is rare. What happens before the procedure?  You will have a blood test to determine your blood type. This is necessary to know what kind of blood your body will accept and to match it to the donor blood.  If you are going to have a planned surgery, you may be able to do an autologous blood donation. This may be done in case you need to have a transfusion.  If you have had an allergic reaction to a transfusion in the past, you may be given medicine to help prevent a reaction. This medicine may be given to you by mouth or through an IV tube.  You will have your temperature, blood pressure, and pulse monitored before the transfusion.  Follow instructions from your health care provider about eating and drinking restrictions.  Ask your health care provider about:  Changing or stopping your regular medicines. This is especially important if you are taking diabetes medicines or blood thinners.  Taking medicines such as aspirin and ibuprofen. These medicines can thin your blood. Do not take these medicines before your procedure if your health care provider instructs you not to. What happens during the procedure?  An IV tube will be   inserted into one of your veins.  The bag of donated blood will be attached to your IV tube. The blood will then enter through your vein.  Your temperature, blood pressure, and pulse will be monitored regularly during the transfusion. This monitoring is done to detect early signs of a transfusion reaction.  If you have any signs or symptoms of a reaction, your transfusion will be stopped and  you may be given medicine.  When the transfusion is complete, your IV tube will be removed.  Pressure may be applied to the IV site for a few minutes.  A bandage (dressing) will be applied. The procedure may vary among health care providers and hospitals. What happens after the procedure?  Your temperature, blood pressure, heart rate, breathing rate, and blood oxygen level will be monitored often.  Your blood may be tested to see how you are responding to the transfusion.  You may be warmed with fluids or blankets to maintain a normal body temperature. Summary  A blood transfusion is a procedure in which you receive donated blood, including plasma, platelets, and red blood cells, through an IV tube.  Your temperature, blood pressure, and pulse will be monitored before, during, and after the transfusion.  Your blood may be tested after the transfusion to see how your body has responded. This information is not intended to replace advice given to you by your health care provider. Make sure you discuss any questions you have with your health care provider. Document Released: 02/01/2000 Document Revised: 11/01/2015 Document Reviewed: 11/01/2015 [employer]vier Interactive Patient Education  2017 [employer]  

## 2016-03-24 ENCOUNTER — Ambulatory Visit (INDEPENDENT_AMBULATORY_CARE_PROVIDER_SITE_OTHER): Payer: Medicaid Other | Admitting: Medical

## 2016-03-24 ENCOUNTER — Ambulatory Visit: Payer: Medicaid Other | Admitting: Medical

## 2016-03-24 VITALS — BP 132/80 | HR 64 | Wt 215.0 lb

## 2016-03-24 DIAGNOSIS — L0231 Cutaneous abscess of buttock: Secondary | ICD-10-CM

## 2016-03-24 DIAGNOSIS — E118 Type 2 diabetes mellitus with unspecified complications: Secondary | ICD-10-CM

## 2016-03-24 DIAGNOSIS — D696 Thrombocytopenia, unspecified: Secondary | ICD-10-CM

## 2016-03-24 DIAGNOSIS — R21 Rash and other nonspecific skin eruption: Secondary | ICD-10-CM | POA: Insufficient documentation

## 2016-03-24 DIAGNOSIS — E43 Unspecified severe protein-calorie malnutrition: Secondary | ICD-10-CM

## 2016-03-24 DIAGNOSIS — R609 Edema, unspecified: Secondary | ICD-10-CM | POA: Insufficient documentation

## 2016-03-24 DIAGNOSIS — M791 Myalgia, unspecified site: Secondary | ICD-10-CM | POA: Insufficient documentation

## 2016-03-24 DIAGNOSIS — E8809 Other disorders of plasma-protein metabolism, not elsewhere classified: Secondary | ICD-10-CM

## 2016-03-24 DIAGNOSIS — L739 Follicular disorder, unspecified: Secondary | ICD-10-CM | POA: Insufficient documentation

## 2016-03-24 DIAGNOSIS — R748 Abnormal levels of other serum enzymes: Secondary | ICD-10-CM | POA: Diagnosis not present

## 2016-03-24 DIAGNOSIS — E46 Unspecified protein-calorie malnutrition: Secondary | ICD-10-CM

## 2016-03-24 LAB — TYPE AND SCREEN
ABO/RH(D): B POS
ANTIBODY SCREEN: NEGATIVE
UNIT DIVISION: 0
Unit division: 0
Unit division: 0
Unit division: 0

## 2016-03-24 LAB — CK: Total CK: 858 U/L — ABNORMAL HIGH (ref 7–232)

## 2016-03-24 MED ORDER — DOXYCYCLINE HYCLATE 100 MG PO TABS: 100.0000 mg | ORAL_TABLET | Freq: Two times a day (BID) | ORAL | 0 refills | Status: DC

## 2016-03-24 MED ORDER — MUPIROCIN 2 % EX OINT: 1.0000 "application " | TOPICAL_OINTMENT | Freq: Three times a day (TID) | CUTANEOUS | 1 refills | Status: DC

## 2016-03-24 NOTE — Progress Notes (Signed)
Subjective: Chief Complaint  Patient presents with  . ulcer on butt    ulcer on butt, swelling in legs    Here for some concerns.  He was seen in the ED on 03/19/16 for leg edema to rule out DVT or other.   Since then his swelling has improved.  Has only had swelling of legs in the last 1-2 weeks.  Denies chest pain, SOB, palpitations.  He did end up having transfusion last week with low hemoglobin.    He still isn't sure why he has had the swelling.     He has "ulcer" on his right butt cheek.   He note that I saw him for similar over a year ago.  At that time the issue resolved with antibiotics.   He denies fever, but has had some pain at the butt cheek ulcer.   It popped and drained over the weekend some.  Denies sick contact with abscess  He reports muscle aches of lower legs, thighs, triceps for the past week or maybe more.   No injury, no fall.     Still has the torso rash I saw him for.  He says pharmacy never received prescription from me last week although we sent it.   Past Medical History:  Diagnosis Date  . Cancer ([place])   . Dental caries   . Diabetes mellitus, type II ([place]) 01/2012  . Hyperlipidemia   . Influenza vaccine refused 01/18/2013  . Kidney cancer, primary, with metastasis from kidney to other site [facility])   . Obesity   . Pneumococcal vaccine refused 01/18/2013  . Scoliosis   . Tobacco use   . Vision impairment    left, due to prior trauma   Current Outpatient Prescriptions on File Prior to Visit  Medication Sig Dispense Refill  . carvedilol (COREG) 3.125 MG tablet Take 1 tablet (3.125 mg total) by mouth 2 (two) times daily with a meal. 180 tablet 0  . chlorproMAZINE (THORAZINE) 50 MG tablet Take 1 tablet (50 mg total) by mouth 3 (three) times daily as needed for hiccoughs. 90 tablet 0  . clonazePAM (KLONOPIN) 0.5 MG tablet TAKE 1 TABLET BY MOUTH 1-2 TIMES DAILY AS NEEDED FOR ANXIETY. MAY CAUSE SEDATION 15 tablet 0  . cyanocobalamin (,VITAMIN B-12,) 1000 MCG/ML  injection Please take intramuscular once weekly for 4 weeks, then change to once monthly after that 1 mL 12  . diphenhydrAMINE (BENADRYL) 25 mg capsule Take 25 mg by mouth every 6 (six) hours as needed for allergies.    . ferrous sulfate 325 (65 FE) MG tablet Take 1 tablet (325 mg total) by mouth 2 (two) times daily with a meal. 60 tablet 3  . glipiZIDE (GLUCOTROL) 5 MG tablet Take 1 tablet (5 mg total) by mouth daily before breakfast. 30 tablet 2  . HYDROcodone-acetaminophen (NORCO/VICODIN) 5-325 MG tablet Take 1 tablet by mouth every 6 (six) hours as needed for moderate pain. 30 tablet 0  . lidocaine-prilocaine (EMLA) cream Apply 1 application topically as needed. 30 g 0  . megestrol (MEGACE) 400 MG/10ML suspension Take 10 mLs (400 mg total) by mouth 2 (two) times daily. 240 mL 5  . metFORMIN (GLUCOPHAGE) 500 MG tablet Take 1 tablet (500 mg total) by mouth 2 (two) times daily with a meal. 180 tablet 1  . pantoprazole (PROTONIX) 40 MG tablet Take 1 tablet (40 mg total) by mouth daily at 6 (six) AM. 30 tablet 3  . prochlorperazine (COMPAZINE) 10 MG tablet Take 1  tablet (10 mg total) by mouth every 6 (six) hours as needed for nausea or vomiting. 30 tablet 1   No current facility-administered medications on file prior to visit.    ROS as in subjective   Objective: BP 132/80   Pulse 64   Wt 215 lb (97.5 kg)   SpO2 98%   BMI 31.75 kg/m   Gen: wd, wn, nad Right buttock along gluteal cleft with 2cm area of slight induration, crusting and scab, but no fluctuance, no warmth.  Mild erythema, mild tenderness.  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  Marland Kitchen Abscess of buttock, right Yes  . Elevated CK   . Myalgia   . Edema, unspecified type   . Type 2 diabetes mellitus with complication, without long-term current use of insulin ([place])   . Protein-calorie malnutrition, severe   . Hypoalbuminemia due to protein-calorie malnutrition ([place])   . Folliculitis   . Rash   . Thrombocytopenia ([place])     Plan: Abscess - look like it is trying to heal.   No fluctuance or warmth today.   Begin doxycycline for both the abscess and the torso rash which may be a folliculitis.   advised hot bath soapy water soaks 102min each for the abscess.   If this isn't improving in the next 3-4 days, then recheck  Rash/folliculitis - begin doxycycline oral, mupirocin topical.   If not resolving within 10 days, then referral to dermatology  Elevated CK recently on lab, mild, recheck lab today given the myalgias.  Advised he c/t good hydration, rest.  Repeat lab today  myalgia - etiology not clear  diabetes - c/t same medication  Edema - possibly related to dependent edema vs hypoalbuminemia or other.  Thrombocytopenia, anemia - followed managed by oncology/hematology  David [name] was seen today for ulcer on butt.  Diagnoses and all orders for this visit:  Abscess of buttock, right -     CK  Elevated CK -     CK  Myalgia -     CK  Edema, unspecified type  Type 2 diabetes mellitus with complication, without long-term current use of insulin ([facility])  Protein-calorie malnutrition, severe  Hypoalbuminemia due to protein-calorie malnutrition ([facility])  Folliculitis  Rash  Thrombocytopenia ([facility])  Other orders -     doxycycline (VIBRA-TABS) 100 MG tablet; Take 1 tablet (100 mg total) by mouth 2 (two) times daily. -     mupirocin ointment (BACTROBAN) 2 %; Apply 1 application topically 3 (three) times daily.

## 2016-03-25 ENCOUNTER — Ambulatory Visit: Payer: Medicaid Other

## 2016-03-25 ENCOUNTER — Telehealth: Payer: Self-pay | Admitting: Oncology

## 2016-03-25 ENCOUNTER — Ambulatory Visit (HOSPITAL_BASED_OUTPATIENT_CLINIC_OR_DEPARTMENT_OTHER): Payer: Medicaid Other | Admitting: Oncology

## 2016-03-25 ENCOUNTER — Other Ambulatory Visit (HOSPITAL_BASED_OUTPATIENT_CLINIC_OR_DEPARTMENT_OTHER): Payer: Medicaid Other

## 2016-03-25 VITALS — BP 169/98 | HR 76 | Temp 98.1°F | Resp 18 | Ht 69.0 in | Wt 217.8 lb

## 2016-03-25 DIAGNOSIS — D630 Anemia in neoplastic disease: Secondary | ICD-10-CM

## 2016-03-25 DIAGNOSIS — C642 Malignant neoplasm of left kidney, except renal pelvis: Secondary | ICD-10-CM

## 2016-03-25 DIAGNOSIS — Z452 Encounter for adjustment and management of vascular access device: Secondary | ICD-10-CM | POA: Diagnosis not present

## 2016-03-25 DIAGNOSIS — C7800 Secondary malignant neoplasm of unspecified lung: Secondary | ICD-10-CM | POA: Diagnosis not present

## 2016-03-25 DIAGNOSIS — C659 Malignant neoplasm of unspecified renal pelvis: Secondary | ICD-10-CM

## 2016-03-25 DIAGNOSIS — Z95828 Presence of other vascular implants and grafts: Secondary | ICD-10-CM

## 2016-03-25 DIAGNOSIS — C649 Malignant neoplasm of unspecified kidney, except renal pelvis: Secondary | ICD-10-CM

## 2016-03-25 LAB — CBC WITH DIFFERENTIAL/PLATELET
BASO%: 0.5 % (ref 0.0–2.0)
Basophils Absolute: 0 10*3/uL (ref 0.0–0.1)
EOS ABS: 0.1 10*3/uL (ref 0.0–0.5)
EOS%: 1.8 % (ref 0.0–7.0)
HEMATOCRIT: 32 % — AB (ref 38.4–49.9)
HGB: 10 g/dL — ABNORMAL LOW (ref 13.0–17.1)
LYMPH#: 1.3 10*3/uL (ref 0.9–3.3)
LYMPH%: 34.1 % (ref 14.0–49.0)
MCH: 31.2 pg (ref 27.2–33.4)
MCHC: 31.3 g/dL — AB (ref 32.0–36.0)
MCV: 99.7 fL — AB (ref 79.3–98.0)
MONO#: 0.9 10*3/uL (ref 0.1–0.9)
MONO%: 24.2 % — ABNORMAL HIGH (ref 0.0–14.0)
NEUT#: 1.5 10*3/uL (ref 1.5–6.5)
NEUT%: 39.4 % (ref 39.0–75.0)
PLATELETS: 162 10*3/uL (ref 140–400)
RBC: 3.21 10*6/uL — ABNORMAL LOW (ref 4.20–5.82)
RDW: 18.2 % — ABNORMAL HIGH (ref 11.0–14.6)
WBC: 3.8 10*3/uL — ABNORMAL LOW (ref 4.0–10.3)

## 2016-03-25 LAB — COMPREHENSIVE METABOLIC PANEL
ALT: 31 U/L (ref 0–55)
ANION GAP: 9 meq/L (ref 3–11)
AST: 36 U/L — ABNORMAL HIGH (ref 5–34)
Albumin: 3.3 g/dL — ABNORMAL LOW (ref 3.5–5.0)
Alkaline Phosphatase: 83 U/L (ref 40–150)
BILIRUBIN TOTAL: 0.38 mg/dL (ref 0.20–1.20)
BUN: 12.3 mg/dL (ref 7.0–26.0)
CALCIUM: 9 mg/dL (ref 8.4–10.4)
CHLORIDE: 108 meq/L (ref 98–109)
CO2: 26 mEq/L (ref 22–29)
Creatinine: 1 mg/dL (ref 0.7–1.3)
Glucose: 100 mg/dl (ref 70–140)
Potassium: 4.3 mEq/L (ref 3.5–5.1)
Sodium: 142 mEq/L (ref 136–145)
TOTAL PROTEIN: 6.4 g/dL (ref 6.4–8.3)

## 2016-03-25 MED ORDER — HEPARIN SOD (PORK) LOCK FLUSH 100 UNIT/ML IV SOLN
500.0000 [IU] | Freq: Once | INTRAVENOUS | Status: AC
  Administered 2016-03-25: 500 [IU] via INTRAVENOUS
  Filled 2016-03-25: qty 5

## 2016-03-25 MED ORDER — SODIUM CHLORIDE 0.9% FLUSH
10.0000 mL | INTRAVENOUS | Status: DC | PRN
  Administered 2016-03-25: 10 mL via INTRAVENOUS
  Filled 2016-03-25: qty 10

## 2016-03-25 MED ORDER — OXYCODONE HCL 5 MG PO TABS
5.0000 mg | ORAL_TABLET | ORAL | 0 refills | Status: DC | PRN
Start: 2016-03-25 — End: 2016-04-15

## 2016-03-25 NOTE — Telephone Encounter (Signed)
LOS not completed, per 03/25/16 visit.

## 2016-03-25 NOTE — Patient Instructions (Signed)

## 2016-03-25 NOTE — Progress Notes (Signed)
Hematology and Oncology Follow Up Visit  Zoey Presser [id number] 09-Oct-1960 56 y.o. 03/25/2016 9:22 AM  Principle Diagnosis: 56 year old gentleman with metastatic carcinoma arising from the left kidney and the renal pelvis. He presented with a 5 cm mass in the central left kidney as well as pulmonary metastasis, nodules and pleural effusion.   Prior Therapy: Status post biopsy obtained on 10/12/2015 which confirmed the presence of carcinoma. 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  Current therapy: Chemotherapy with gemcitabine and carboplatin cycle one  on 10/30/2015. He is status post 7 cycles of therapy. He is here for day 1 of cycle 8 of chemotherapy.  Interim History: Mr. Baptist presents today for a follow-up visit. Since the last visit, he reports feeling reasonably well. His main issue continues to be myalgias in his lower extremities. He is reporting Pain and thigh muscle pain. He has been using hydrocodone which has been ineffective in controlling his pain. He was evaluated by his primary care provider and CK levels checked on February, 5 2018 was elevated at 858. His lower extremity edema has improved at this time. He is ambulating without any major difficulties.  He denied any headaches, blurry vision, syncope or seizures. He does report fevers but no chills.He does not report any chest pain, palpitation, orthopnea, leg edema or dyspnea on exertion. He denied any cough, wheezing or hemoptysis. He does not report any nausea, vomiting or early satiety. He did report constipation which have resolved. He denied any diarrhea. He does not report any frequency, urgency or hesitancy. He does not report any skeletal complaints. Remaining review of system is unremarkable.   Medications: I have reviewed the patient's current medications.  Current Outpatient Prescriptions  Medication Sig Dispense Refill  . carvedilol (COREG) 3.125 MG tablet Take 1 tablet (3.125  mg total) by mouth 2 (two) times daily with a meal. 180 tablet 0  . chlorproMAZINE (THORAZINE) 50 MG tablet Take 1 tablet (50 mg total) by mouth 3 (three) times daily as needed for hiccoughs. 90 tablet 0  . clonazePAM (KLONOPIN) 0.5 MG tablet TAKE 1 TABLET BY MOUTH 1-2 TIMES DAILY AS NEEDED FOR ANXIETY. MAY CAUSE SEDATION 15 tablet 0  . cyanocobalamin (,VITAMIN B-12,) 1000 MCG/ML injection Please take intramuscular once weekly for 4 weeks, then change to once monthly after that 1 mL 12  . diphenhydrAMINE (BENADRYL) 25 mg capsule Take 25 mg by mouth every 6 (six) hours as needed for allergies.    Marland Kitchen doxycycline (VIBRA-TABS) 100 MG tablet Take 1 tablet (100 mg total) by mouth 2 (two) times daily. 20 tablet 0  . ferrous sulfate 325 (65 FE) MG tablet Take 1 tablet (325 mg total) by mouth 2 (two) times daily with a meal. 60 tablet 3  . glipiZIDE (GLUCOTROL) 5 MG tablet Take 1 tablet (5 mg total) by mouth daily before breakfast. 30 tablet 2  . lidocaine-prilocaine (EMLA) cream Apply 1 application topically as needed. 30 g 0  . megestrol (MEGACE) 400 MG/10ML suspension Take 10 mLs (400 mg total) by mouth 2 (two) times daily. 240 mL 5  . metFORMIN (GLUCOPHAGE) 500 MG tablet Take 1 tablet (500 mg total) by mouth 2 (two) times daily with a meal. 180 tablet 1  . mupirocin ointment (BACTROBAN) 2 % Apply 1 application topically 3 (three) times daily. 30 g 1  . pantoprazole (PROTONIX) 40 MG tablet Take 1 tablet (40 mg total) by mouth daily at 6 (six) AM. 30 tablet 3  .  prochlorperazine (COMPAZINE) 10 MG tablet Take 1 tablet (10 mg total) by mouth every 6 (six) hours as needed for nausea or vomiting. 30 tablet 1  . oxyCODONE (OXY IR/ROXICODONE) 5 MG immediate release tablet Take 1 tablet (5 mg total) by mouth every 4 (four) hours as needed for severe pain. 30 tablet 0   Current Facility-Administered Medications  Medication Dose Route Frequency Provider Last Rate Last Dose  . sodium chloride flush (NS) 0.9 %  injection 10 mL  10 mL Intravenous PRN Wyatt Portela, MD   10 mL at 03/25/16 [id number]     Allergies: No Known Allergies  Past Medical History, Surgical history, Social history, and Family History were reviewed and updated.   Physical Exam: Blood pressure (!) 169/98, pulse 76, temperature 98.1 F (36.7 C), temperature source Oral, resp. rate 18, height 5\' 9"  (1.753 m), weight 217 lb 12.8 oz (98.8 kg), SpO2 100 %. ECOG: 1 General appearance: Alert, awake gentleman without distress. Head: Normocephalic, without obvious abnormality no oral ulcers or lesions. Neck: no adenopathy Lymph nodes: Cervical, supraclavicular, and axillary nodes normal. Heart:regular rate and rhythm, S1, S2 normal, no murmur, click, rub or gallop Lung:chest clear, no wheezing, rales, normal symmetric air entry Abdomin: soft, non-tender, without masses or organomegaly no shifting dullness or ascites. [id number] lower extremity edema bilaterally.   Lab Results: Lab Results  Component Value Date   WBC 3.8 (L) 03/25/2016   HGB 10.0 (L) 03/25/2016   HCT 32.0 (L) 03/25/2016   MCV 99.7 (H) 03/25/2016   PLT 162 03/25/2016     Chemistry      Component Value Date/Time   NA 142 03/25/2016 0754   K 4.3 03/25/2016 0754   CL 106 03/19/2016 2120   CO2 26 03/25/2016 0754   BUN 12.3 03/25/2016 0754   CREATININE 1.0 03/25/2016 0754      Component Value Date/Time   CALCIUM 9.0 03/25/2016 0754   ALKPHOS 83 03/25/2016 0754   AST 36 (H) 03/25/2016 0754   ALT 31 03/25/2016 0754   BILITOT 0.38 03/25/2016 0754        Impression and Plan:  56 year old gentleman with the following issues:  1. 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 He is status post biopsy of a pleural-based nodule which confirmed the presence of carcinoma although further immunohistochemical staining was not performed because of lack of tissue.  These findings confirm the presence  of advanced malignancy Arising from the renal pelvis.  He is currently receiving salvage chemotherapy with carboplatin and gemcitabine. Chemotherapy has been well tolerated with dramatic improvement in his quality of life.  CT scan on 11/09/2017showed a positive response with decrease of the volume of disease at this time.  CT scan obtained on 03/03/2016 continues to show excellent response to therapy.   The plan is to hold chemotherapy for the time being given his symptoms of myositis and elevated CK. It is unclear whether chemotherapy is contributing to these findings are not but given his symptoms and the duration of chemotherapy, I have recommended suspending chemotherapy at least for the time being to give him a break and allow his body to recover.  2. IV access: Port-A-Cath has been utilized without complications.  3. Nausea prophylaxis: He has prescription for antiemetics available to him. No issues reported at this time.  4. Lower extremity edema:   5. Anemia: Related to malignancy and chemotherapy infusion. He  is status post chemotherapy  on 03/21/2016. Hemoglobin appears to be improved.   6. Myositis with elevated stable CK. Unclear etiology at this time. Chemotherapy could be contributing and for this reason I will suspend chemotherapy for the time being. He is not on any statin medications and could be causing this. He is on oral hypoglycemic agents with Glucophage and glyburide. It is unclear to me whether these medication could cause this I would consider less likely.  7. Follow-up: Will be in one week to check his clinical status.   [facility], MD 2/6/20189:22 AM

## 2016-03-25 NOTE — Progress Notes (Signed)
Per Randolm Idol RN, infusion appt for today was canceled.

## 2016-03-31 ENCOUNTER — Other Ambulatory Visit: Payer: Self-pay | Admitting: Medical

## 2016-04-01 ENCOUNTER — Ambulatory Visit (HOSPITAL_BASED_OUTPATIENT_CLINIC_OR_DEPARTMENT_OTHER): Payer: Medicaid Other

## 2016-04-01 ENCOUNTER — Ambulatory Visit: Payer: Medicaid Other

## 2016-04-01 ENCOUNTER — Other Ambulatory Visit (HOSPITAL_BASED_OUTPATIENT_CLINIC_OR_DEPARTMENT_OTHER): Payer: Medicaid Other

## 2016-04-01 VITALS — BP 163/85 | HR 74 | Temp 98.4°F | Resp 17

## 2016-04-01 DIAGNOSIS — C659 Malignant neoplasm of unspecified renal pelvis: Secondary | ICD-10-CM

## 2016-04-01 DIAGNOSIS — Z5111 Encounter for antineoplastic chemotherapy: Secondary | ICD-10-CM | POA: Diagnosis not present

## 2016-04-01 DIAGNOSIS — C642 Malignant neoplasm of left kidney, except renal pelvis: Secondary | ICD-10-CM | POA: Diagnosis not present

## 2016-04-01 DIAGNOSIS — C7800 Secondary malignant neoplasm of unspecified lung: Secondary | ICD-10-CM | POA: Diagnosis not present

## 2016-04-01 DIAGNOSIS — Z95828 Presence of other vascular implants and grafts: Secondary | ICD-10-CM

## 2016-04-01 LAB — COMPREHENSIVE METABOLIC PANEL
ALK PHOS: 80 U/L (ref 40–150)
ALT: 30 U/L (ref 0–55)
ANION GAP: 11 meq/L (ref 3–11)
AST: 22 U/L (ref 5–34)
Albumin: 3.9 g/dL (ref 3.5–5.0)
BILIRUBIN TOTAL: 0.45 mg/dL (ref 0.20–1.20)
BUN: 19.4 mg/dL (ref 7.0–26.0)
CALCIUM: 9.6 mg/dL (ref 8.4–10.4)
CHLORIDE: 106 meq/L (ref 98–109)
CO2: 24 mEq/L (ref 22–29)
CREATININE: 1.2 mg/dL (ref 0.7–1.3)
EGFR: 81 mL/min/{1.73_m2} — ABNORMAL LOW (ref >90)
Glucose: 105 mg/dl (ref 70–140)
Potassium: 3.7 mEq/L (ref 3.5–5.1)
Sodium: 141 mEq/L (ref 136–145)
TOTAL PROTEIN: 7.2 g/dL (ref 6.4–8.3)

## 2016-04-01 LAB — CBC WITH DIFFERENTIAL/PLATELET
BASO%: 1.1 % (ref 0.0–2.0)
Basophils Absolute: 0.1 10*3/uL (ref 0.0–0.1)
EOS%: 0.5 % (ref 0.0–7.0)
Eosinophils Absolute: 0 10*3/uL (ref 0.0–0.5)
HEMATOCRIT: 34.9 % — AB (ref 38.4–49.9)
HGB: 11.2 g/dL — ABNORMAL LOW (ref 13.0–17.1)
LYMPH#: 2.3 10*3/uL (ref 0.9–3.3)
LYMPH%: 36.9 % (ref 14.0–49.0)
MCH: 30.9 pg (ref 27.2–33.4)
MCHC: 32.1 g/dL (ref 32.0–36.0)
MCV: 96.4 fL (ref 79.3–98.0)
MONO#: 1.4 10*3/uL — AB (ref 0.1–0.9)
MONO%: 22.4 % — ABNORMAL HIGH (ref 0.0–14.0)
NEUT#: 2.5 10*3/uL (ref 1.5–6.5)
NEUT%: 39.1 % (ref 39.0–75.0)
Platelets: 383 10*3/uL (ref 140–400)
RBC: 3.62 10*6/uL — ABNORMAL LOW (ref 4.20–5.82)
RDW: 17.6 % — ABNORMAL HIGH (ref 11.0–14.6)
WBC: 6.3 10*3/uL (ref 4.0–10.3)

## 2016-04-01 MED ORDER — PROCHLORPERAZINE MALEATE 10 MG PO TABS
ORAL_TABLET | ORAL | Status: AC
  Filled 2016-04-01: qty 1

## 2016-04-01 MED ORDER — CARBOPLATIN CHEMO INTRADERMAL TEST DOSE 100MCG/0.02ML: 100.0000 ug | Freq: Once | INTRADERMAL | Status: DC

## 2016-04-01 MED ORDER — SODIUM CHLORIDE 0.9% FLUSH
10.0000 mL | INTRAVENOUS | Status: DC | PRN
  Administered 2016-04-01: 10 mL via INTRAVENOUS
  Filled 2016-04-01: qty 10

## 2016-04-01 MED ORDER — SODIUM CHLORIDE 0.9 % IV SOLN
Freq: Once | INTRAVENOUS | Status: AC
  Administered 2016-04-01: 09:00:00 via INTRAVENOUS

## 2016-04-01 MED ORDER — HEPARIN SOD (PORK) LOCK FLUSH 100 UNIT/ML IV SOLN
500.0000 [IU] | Freq: Once | INTRAVENOUS | Status: AC | PRN
  Administered 2016-04-01: 500 [IU]
  Filled 2016-04-01: qty 5

## 2016-04-01 MED ORDER — SODIUM CHLORIDE 0.9% FLUSH
10.0000 mL | INTRAVENOUS | Status: DC | PRN
  Administered 2016-04-01: 10 mL
  Filled 2016-04-01: qty 10

## 2016-04-01 MED ORDER — PROCHLORPERAZINE MALEATE 10 MG PO TABS
10.0000 mg | ORAL_TABLET | Freq: Four times a day (QID) | ORAL | Status: DC | PRN
Start: 2016-04-01 — End: 2016-04-01
  Administered 2016-04-01: 10 mg via ORAL

## 2016-04-01 MED ORDER — SODIUM CHLORIDE 0.9 % IV SOLN
1000.0000 mg/m2 | Freq: Once | INTRAVENOUS | Status: AC
  Administered 2016-04-01: 2090 mg via INTRAVENOUS
  Filled 2016-04-01 (×2): qty 55
  Filled 2016-04-01: qty 54.97

## 2016-04-01 NOTE — Patient Instructions (Signed)

## 2016-04-01 NOTE — Progress Notes (Signed)
Dr. Alen Blew okay to use CMET from 03/25/16.

## 2016-04-01 NOTE — Patient Instructions (Addendum)
Cancer Center Discharge Instructions for Patients Receiving Chemotherapy  Today you received the following chemotherapy agents Gemzar  To help prevent nausea and vomiting after your treatment, we encourage you to take your nausea medication as prescribed   If you develop nausea and vomiting that is not controlled by your nausea medication, call the clinic.   BELOW ARE SYMPTOMS THAT SHOULD BE REPORTED IMMEDIATELY:  *FEVER GREATER THAN 100.5 F  *CHILLS WITH OR WITHOUT FEVER  NAUSEA AND VOMITING THAT IS NOT CONTROLLED WITH YOUR NAUSEA MEDICATION  *UNUSUAL SHORTNESS OF BREATH  *UNUSUAL BRUISING OR BLEEDING  TENDERNESS IN MOUTH AND THROAT WITH OR WITHOUT PRESENCE OF ULCERS  *URINARY PROBLEMS  *BOWEL PROBLEMS  UNUSUAL RASH Items with * indicate a potential emergency and should be followed up as soon as possible.  Feel free to call the clinic you have any questions or concerns. The clinic phone number is [phone].  Please show the CHEMO ALERT CARD at check-in to the Emergency Department and triage nurse.   

## 2016-04-02 ENCOUNTER — Telehealth: Payer: Self-pay | Admitting: Medical

## 2016-04-02 NOTE — Telephone Encounter (Signed)
Pt requesting name and contact info for the kidney doctor that diagnosed his cancer. Pt said Audelia Acton referred him to this kidney doctor.

## 2016-04-02 NOTE — Telephone Encounter (Signed)
Looking back, the diagnosis came from combination of CT done at emergency dept 09/2015, from results from the lung fluid biopsy a few days after the initial 09/2015 ED visit, and through evaluation of all the labs, biopsy and imaging reports.  I don't know if a kidney doctor was involved.  I don't think he ended up seeing a kidney doctor.  Dr. Alen Blew ultimately confirmed the diagnosis.  What is his concern or questions?

## 2016-04-04 ENCOUNTER — Telehealth: Payer: Self-pay | Admitting: *Deleted

## 2016-04-04 NOTE — Telephone Encounter (Signed)
Spoke with pt and was informed that pt had chemo on  Tuesday 04/01/16 - Gemzar.  Stated for past 2 days, pt has experienced sweating off and on.  Denied  Fever,  Denied  Pain,  Denied  Nausea/Vomiting.  Stated  Eating and  Drinking fine,  Bowel and  Bladder function fine.  Had legs pain after chemo - took Hydrocodone with relief per pt. Pt wanted to let Dr. Alen Blew know, and for any recommendations. Pt's   Phone     [phone].

## 2016-04-08 ENCOUNTER — Ambulatory Visit (HOSPITAL_BASED_OUTPATIENT_CLINIC_OR_DEPARTMENT_OTHER): Payer: Medicaid Other

## 2016-04-08 ENCOUNTER — Ambulatory Visit: Payer: Medicaid Other

## 2016-04-08 ENCOUNTER — Other Ambulatory Visit (HOSPITAL_BASED_OUTPATIENT_CLINIC_OR_DEPARTMENT_OTHER): Payer: Medicaid Other

## 2016-04-08 VITALS — BP 149/85 | HR 63 | Temp 97.6°F | Resp 17

## 2016-04-08 DIAGNOSIS — C659 Malignant neoplasm of unspecified renal pelvis: Secondary | ICD-10-CM

## 2016-04-08 DIAGNOSIS — Z5111 Encounter for antineoplastic chemotherapy: Secondary | ICD-10-CM

## 2016-04-08 DIAGNOSIS — C642 Malignant neoplasm of left kidney, except renal pelvis: Secondary | ICD-10-CM

## 2016-04-08 DIAGNOSIS — Z95828 Presence of other vascular implants and grafts: Secondary | ICD-10-CM

## 2016-04-08 DIAGNOSIS — C7800 Secondary malignant neoplasm of unspecified lung: Secondary | ICD-10-CM

## 2016-04-08 LAB — CBC WITH DIFFERENTIAL/PLATELET
BASO%: 0.7 % (ref 0.0–2.0)
BASOS ABS: 0.1 10*3/uL (ref 0.0–0.1)
EOS%: 0.2 % (ref 0.0–7.0)
Eosinophils Absolute: 0 10*3/uL (ref 0.0–0.5)
HEMATOCRIT: 30.7 % — AB (ref 38.4–49.9)
HGB: 10.3 g/dL — ABNORMAL LOW (ref 13.0–17.1)
LYMPH#: 1.9 10*3/uL (ref 0.9–3.3)
LYMPH%: 25.6 % (ref 14.0–49.0)
MCH: 32.5 pg (ref 27.2–33.4)
MCHC: 33.6 g/dL (ref 32.0–36.0)
MCV: 96.7 fL (ref 79.3–98.0)
MONO#: 0.8 10*3/uL (ref 0.1–0.9)
MONO%: 11.2 % (ref 0.0–14.0)
NEUT#: 4.5 10*3/uL (ref 1.5–6.5)
NEUT%: 62.3 % (ref 39.0–75.0)
PLATELETS: 152 10*3/uL (ref 140–400)
RBC: 3.18 10*6/uL — ABNORMAL LOW (ref 4.20–5.82)
RDW: 18.7 % — ABNORMAL HIGH (ref 11.0–14.6)
WBC: 7.3 10*3/uL (ref 4.0–10.3)

## 2016-04-08 LAB — TECHNOLOGIST REVIEW

## 2016-04-08 LAB — COMPREHENSIVE METABOLIC PANEL
ALT: 20 U/L (ref 0–55)
ANION GAP: 12 meq/L — AB (ref 3–11)
AST: 22 U/L (ref 5–34)
Albumin: 3.4 g/dL — ABNORMAL LOW (ref 3.5–5.0)
Alkaline Phosphatase: 76 U/L (ref 40–150)
BUN: 15.2 mg/dL (ref 7.0–26.0)
CALCIUM: 9.5 mg/dL (ref 8.4–10.4)
CHLORIDE: 108 meq/L (ref 98–109)
CO2: 22 mEq/L (ref 22–29)
Creatinine: 1 mg/dL (ref 0.7–1.3)
EGFR: >90 mL/min/{1.73_m2} (ref >90)
Glucose: 112 mg/dl (ref 70–140)
POTASSIUM: 3.8 meq/L (ref 3.5–5.1)
Sodium: 143 mEq/L (ref 136–145)
Total Bilirubin: 0.26 mg/dL (ref 0.20–1.20)
Total Protein: 7 g/dL (ref 6.4–8.3)

## 2016-04-08 MED ORDER — SODIUM CHLORIDE 0.9% FLUSH
10.0000 mL | INTRAVENOUS | Status: DC | PRN
  Administered 2016-04-08: 10 mL
  Filled 2016-04-08: qty 10

## 2016-04-08 MED ORDER — PROCHLORPERAZINE MALEATE 10 MG PO TABS
10.0000 mg | ORAL_TABLET | Freq: Once | ORAL | Status: AC
  Administered 2016-04-08: 10 mg via ORAL

## 2016-04-08 MED ORDER — HEPARIN SOD (PORK) LOCK FLUSH 100 UNIT/ML IV SOLN
500.0000 [IU] | Freq: Once | INTRAVENOUS | Status: AC | PRN
  Administered 2016-04-08: 500 [IU]
  Filled 2016-04-08: qty 5

## 2016-04-08 MED ORDER — PROCHLORPERAZINE MALEATE 10 MG PO TABS
ORAL_TABLET | ORAL | Status: AC
  Filled 2016-04-08: qty 1

## 2016-04-08 MED ORDER — SODIUM CHLORIDE 0.9 % IV SOLN
1000.0000 mg/m2 | Freq: Once | INTRAVENOUS | Status: AC
  Administered 2016-04-08: 2090 mg via INTRAVENOUS
  Filled 2016-04-08: qty 52.63

## 2016-04-08 MED ORDER — SODIUM CHLORIDE 0.9 % IV SOLN
Freq: Once | INTRAVENOUS | Status: AC
  Administered 2016-04-08: 10:00:00 via INTRAVENOUS

## 2016-04-08 MED ORDER — SODIUM CHLORIDE 0.9% FLUSH
10.0000 mL | INTRAVENOUS | Status: DC | PRN
  Administered 2016-04-08: 10 mL via INTRAVENOUS
  Filled 2016-04-08: qty 10

## 2016-04-08 NOTE — Patient Instructions (Signed)
[facility] Discharge Instructions for Patients Receiving Chemotherapy  Today you received the following chemotherapy agents Gemzar  To help prevent nausea and vomiting after your treatment, we encourage you to take your nausea medication as prescribed   If you develop nausea and vomiting that is not controlled by your nausea medication, call the clinic.   BELOW ARE SYMPTOMS THAT SHOULD BE REPORTED IMMEDIATELY:  *FEVER GREATER THAN 100.5 F  *CHILLS WITH OR WITHOUT FEVER  NAUSEA AND VOMITING THAT IS NOT CONTROLLED WITH YOUR NAUSEA MEDICATION  *UNUSUAL SHORTNESS OF BREATH  *UNUSUAL BRUISING OR BLEEDING  TENDERNESS IN MOUTH AND THROAT WITH OR WITHOUT PRESENCE OF ULCERS  *URINARY PROBLEMS  *BOWEL PROBLEMS  UNUSUAL RASH Items with * indicate a potential emergency and should be followed up as soon as possible.  Feel free to call the clinic you have any questions or concerns. The clinic phone number is [phone].  Please show the CHEMO ALERT CARD at check-in to the Emergency Department and triage nurse.   

## 2016-04-09 ENCOUNTER — Telehealth: Payer: Self-pay

## 2016-04-09 NOTE — Telephone Encounter (Signed)
Wife called. Pt is wanting to take a break from chemo. He is tired.and is wanting to get his strength back for granddaughters competition [place] the beach in [place]. Encouraged her to have pt keep his appt on 2/27 for labs/MD/infusion to discuss face to face, and to have labs done.

## 2016-04-10 ENCOUNTER — Ambulatory Visit: Payer: Medicaid Other | Admitting: Oncology

## 2016-04-15 ENCOUNTER — Other Ambulatory Visit (HOSPITAL_BASED_OUTPATIENT_CLINIC_OR_DEPARTMENT_OTHER): Payer: Medicaid Other

## 2016-04-15 ENCOUNTER — Ambulatory Visit (HOSPITAL_BASED_OUTPATIENT_CLINIC_OR_DEPARTMENT_OTHER): Payer: Medicaid Other | Admitting: Oncology

## 2016-04-15 ENCOUNTER — Ambulatory Visit (HOSPITAL_BASED_OUTPATIENT_CLINIC_OR_DEPARTMENT_OTHER): Payer: Medicaid Other

## 2016-04-15 ENCOUNTER — Other Ambulatory Visit: Payer: Self-pay | Admitting: *Deleted

## 2016-04-15 ENCOUNTER — Telehealth: Payer: Self-pay | Admitting: Oncology

## 2016-04-15 ENCOUNTER — Ambulatory Visit: Payer: Medicaid Other

## 2016-04-15 VITALS — BP 147/80 | HR 64 | Temp 97.9°F | Resp 20 | Ht 69.0 in | Wt 205.7 lb

## 2016-04-15 DIAGNOSIS — C659 Malignant neoplasm of unspecified renal pelvis: Secondary | ICD-10-CM

## 2016-04-15 DIAGNOSIS — C642 Malignant neoplasm of left kidney, except renal pelvis: Secondary | ICD-10-CM | POA: Diagnosis present

## 2016-04-15 DIAGNOSIS — Z95828 Presence of other vascular implants and grafts: Secondary | ICD-10-CM

## 2016-04-15 DIAGNOSIS — D630 Anemia in neoplastic disease: Secondary | ICD-10-CM

## 2016-04-15 DIAGNOSIS — Z452 Encounter for adjustment and management of vascular access device: Secondary | ICD-10-CM

## 2016-04-15 DIAGNOSIS — C7800 Secondary malignant neoplasm of unspecified lung: Secondary | ICD-10-CM

## 2016-04-15 LAB — COMPREHENSIVE METABOLIC PANEL
ALT: 25 U/L (ref 0–55)
ANION GAP: 8 meq/L (ref 3–11)
AST: 22 U/L (ref 5–34)
Albumin: 3.4 g/dL — ABNORMAL LOW (ref 3.5–5.0)
Alkaline Phosphatase: 73 U/L (ref 40–150)
BUN: 14.5 mg/dL (ref 7.0–26.0)
CALCIUM: 9.6 mg/dL (ref 8.4–10.4)
CHLORIDE: 107 meq/L (ref 98–109)
CO2: 24 mEq/L (ref 22–29)
CREATININE: 1.1 mg/dL (ref 0.7–1.3)
EGFR: 86 mL/min/{1.73_m2} — AB (ref >90)
Glucose: 93 mg/dl (ref 70–140)
POTASSIUM: 4.3 meq/L (ref 3.5–5.1)
Sodium: 139 mEq/L (ref 136–145)
Total Bilirubin: 0.3 mg/dL (ref 0.20–1.20)
Total Protein: 6.9 g/dL (ref 6.4–8.3)

## 2016-04-15 LAB — CBC WITH DIFFERENTIAL/PLATELET
BASO%: 0.4 % (ref 0.0–2.0)
Basophils Absolute: 0 10*3/uL (ref 0.0–0.1)
EOS%: 0.4 % (ref 0.0–7.0)
Eosinophils Absolute: 0 10*3/uL (ref 0.0–0.5)
HCT: 31.2 % — ABNORMAL LOW (ref 38.4–49.9)
HGB: 10.5 g/dL — ABNORMAL LOW (ref 13.0–17.1)
LYMPH%: 28.3 % (ref 14.0–49.0)
MCH: 32.1 pg (ref 27.2–33.4)
MCHC: 33.6 g/dL (ref 32.0–36.0)
MCV: 95.4 fL (ref 79.3–98.0)
MONO#: 0.4 10*3/uL (ref 0.1–0.9)
MONO%: 6.7 % (ref 0.0–14.0)
NEUT#: 4 10*3/uL (ref 1.5–6.5)
NEUT%: 64.2 % (ref 39.0–75.0)
Platelets: 91 10*3/uL — ABNORMAL LOW (ref 140–400)
RBC: 3.27 10*6/uL — AB (ref 4.20–5.82)
RDW: 17.9 % — ABNORMAL HIGH (ref 11.0–14.6)
WBC: 6.2 10*3/uL (ref 4.0–10.3)
lymph#: 1.7 10*3/uL (ref 0.9–3.3)

## 2016-04-15 MED ORDER — SODIUM CHLORIDE 0.9% FLUSH
10.0000 mL | INTRAVENOUS | Status: DC | PRN
  Administered 2016-04-15: 10 mL via INTRAVENOUS
  Filled 2016-04-15: qty 10

## 2016-04-15 MED ORDER — HEPARIN SOD (PORK) LOCK FLUSH 100 UNIT/ML IV SOLN
500.0000 [IU] | Freq: Once | INTRAVENOUS | Status: AC
  Administered 2016-04-15: 500 [IU] via INTRAVENOUS
  Filled 2016-04-15: qty 5

## 2016-04-15 MED ORDER — CLONAZEPAM 0.5 MG PO TABS: ORAL_TABLET | ORAL | 0 refills | Status: DC

## 2016-04-15 MED ORDER — LIDOCAINE-PRILOCAINE 2.5-2.5 % EX CREA: 1.0000 "application " | TOPICAL_CREAM | CUTANEOUS | 2 refills | Status: DC | PRN

## 2016-04-15 MED ORDER — OXYCODONE HCL 5 MG PO TABS: 5.0000 mg | ORAL_TABLET | ORAL | 0 refills | Status: DC | PRN

## 2016-04-15 NOTE — Progress Notes (Signed)
Hematology and Oncology Follow Up Visit  David [name] [id number] [birth year] 56 y.o. 04/15/2016 9:03 AM  Principle Diagnosis: 56 year old gentleman with metastatic carcinoma arising from the left kidney and the renal pelvis. He presented with a 5 cm mass in the central left kidney as well as pulmonary metastasis, nodules and pleural effusion.   Prior Therapy: Status post biopsy obtained on 10/12/2015 which confirmed the presence of carcinoma. 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  Current therapy: Chemotherapy with gemcitabine and carboplatin cycle one  on 10/30/2015. He is status post 8 cycles of therapy.   Interim History: David [name] presents today for a follow-up visit. Since the last visit, he reports feeling better with improvement in his lower extremity edema. He reports he is eating better and his performance status have resumed to baseline. He is not reporting any fevers or chills. He has not reported any major changes in his appetite. He is not reporting and his dyspnea on exertion or cough.  He denied any headaches, blurry vision, syncope or seizures. He does report fevers but no chills.He does not report any chest pain, palpitation, orthopnea, leg edema or dyspnea on exertion. He denied any cough, wheezing or hemoptysis. He does not report any nausea, vomiting or early satiety. He did report constipation which have resolved. He denied any diarrhea. He does not report any frequency, urgency or hesitancy. He does not report any skeletal complaints. Remaining review of system is unremarkable.   Medications: I have reviewed the patient's current medications.  Current Outpatient Prescriptions  Medication Sig Dispense Refill  . carvedilol (COREG) 3.125 MG tablet take 1 tablet by mouth twice a day with meals 180 tablet 0  . chlorproMAZINE (THORAZINE) 50 MG tablet Take 1 tablet (50 mg total) by mouth 3 (three) times daily as needed for hiccoughs. 90 tablet  0  . cyanocobalamin (,VITAMIN B-12,) 1000 MCG/ML injection Please take intramuscular once weekly for 4 weeks, then change to once monthly after that 1 mL 12  . diphenhydrAMINE (BENADRYL) 25 mg capsule Take 25 mg by mouth every 6 (six) hours as needed for allergies.    Marland Kitchen doxycycline (VIBRA-TABS) 100 MG tablet Take 1 tablet (100 mg total) by mouth 2 (two) times daily. 20 tablet 0  . ferrous sulfate 325 (65 FE) MG tablet Take 1 tablet (325 mg total) by mouth 2 (two) times daily with a meal. 60 tablet 3  . glipiZIDE (GLUCOTROL) 5 MG tablet Take 1 tablet (5 mg total) by mouth daily before breakfast. 30 tablet 2  . megestrol (MEGACE) 400 MG/10ML suspension Take 10 mLs (400 mg total) by mouth 2 (two) times daily. 240 mL 5  . metFORMIN (GLUCOPHAGE) 500 MG tablet Take 1 tablet (500 mg total) by mouth 2 (two) times daily with a meal. 180 tablet 1  . mupirocin ointment (BACTROBAN) 2 % Apply 1 application topically 3 (three) times daily. 30 g 1  . pantoprazole (PROTONIX) 40 MG tablet Take 1 tablet (40 mg total) by mouth daily at 6 (six) AM. 30 tablet 3  . prochlorperazine (COMPAZINE) 10 MG tablet Take 1 tablet (10 mg total) by mouth every 6 (six) hours as needed for nausea or vomiting. 30 tablet 1  . clonazePAM (KLONOPIN) 0.5 MG tablet TAKE 1 TABLET BY MOUTH 1-2 TIMES DAILY AS NEEDED FOR ANXIETY. MAY CAUSE SEDATION 30 tablet 0  . lidocaine-prilocaine (EMLA) cream Apply 1 application topically as needed. 30 g 2  . oxyCODONE (OXY IR/ROXICODONE) 5 MG  immediate release tablet Take 1 tablet (5 mg total) by mouth every 4 (four) hours as needed for severe pain. 30 tablet 0   No current facility-administered medications for this visit.      Allergies: No Known Allergies  Past Medical History, Surgical history, Social history, and Family History were reviewed and updated.   Physical Exam: Blood pressure (!) 147/80, pulse 64, temperature 97.9 F (36.6 C), temperature source Oral, resp. rate 20, height 5\' 9"   (1.753 m), weight 205 lb 11.2 oz (93.3 kg), SpO2 100 %. ECOG: 1 General appearance: Well-appearing gentleman appeared without distress. Head: Normocephalic, without obvious abnormality no thrush or ulcers. Neck: no adenopathy Lymph nodes: Cervical, supraclavicular, and axillary nodes normal. Heart:regular rate and rhythm, S1, S2 normal, no murmur, click, rub or gallop Lung:chest clear, no wheezing, rales, normal symmetric air entry Abdomin: soft, non-tender, without masses or organomegaly no rebound or guarding. EXT:No edema noted in his extremities.   Lab Results: Lab Results  Component Value Date   WBC 6.2 04/15/2016   HGB 10.5 (L) 04/15/2016   HCT 31.2 (L) 04/15/2016   MCV 95.4 04/15/2016   PLT 91 (L) 04/15/2016     Chemistry      Component Value Date/Time   NA 143 04/08/2016 0734   K 3.8 04/08/2016 0734   CL 106 03/19/2016 2120   CO2 22 04/08/2016 0734   BUN 15.2 04/08/2016 0734   CREATININE 1.0 04/08/2016 0734      Component Value Date/Time   CALCIUM 9.5 04/08/2016 0734   ALKPHOS 76 04/08/2016 0734   AST 22 04/08/2016 0734   ALT 20 04/08/2016 0734   BILITOT 0.26 04/08/2016 0734        Impression and Plan:  56 year old gentleman with the following issues:  1. 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 He is status post biopsy of a pleural-based nodule which confirmed the presence of carcinoma although further immunohistochemical staining was not performed because of lack of tissue.  These findings confirm the presence of advanced malignancy Arising from the renal pelvis.  He is currently receiving salvage chemotherapy with carboplatin and gemcitabine. Chemotherapy has been well tolerated with dramatic improvement in his quality of life.  CT scan on 11/09/2017showed a positive response with decrease of the volume of disease at this time.  CT scan obtained on 03/03/2016 continues to show  excellent response to therapy.   He completed 8 cycles of therapy with cycle 8 of chemotherapy was given with gemcitabine only. The plan is to proceed with a treatment break at this time and repeat imaging studies by and of March and resume treatment if he develops progression of disease. I favor actually treating him with immunotherapy at that time.  2. IV access: Port-A-Cath has been utilized without complications. This will be flushed every 6 weeks.  3. Nausea prophylaxis: He has prescription for antiemetics available to him. No issues reported at this time.  4. Lower extremity edema: This is a result at this time.  5. Anemia: Related to malignancy and chemotherapy infusion. Hemoglobin remains stable without transfusion need.  6. Myositis with elevated stable CK. Appears to have improved at this time. His could be related to carboplatin which will be discontinued at this time.  7. Follow-up: Will be in 3 weeks for recheck on his clinical status.  [facility], MD 2/27/20189:03 AM

## 2016-04-15 NOTE — Telephone Encounter (Signed)
Gave patient avs report and appointments for March  °

## 2016-04-17 ENCOUNTER — Ambulatory Visit: Payer: Medicaid Other | Admitting: Oncology

## 2016-04-22 ENCOUNTER — Other Ambulatory Visit: Payer: Medicaid Other

## 2016-04-22 ENCOUNTER — Ambulatory Visit: Payer: Medicaid Other

## 2016-04-29 ENCOUNTER — Other Ambulatory Visit: Payer: Medicaid Other

## 2016-05-08 ENCOUNTER — Ambulatory Visit (HOSPITAL_BASED_OUTPATIENT_CLINIC_OR_DEPARTMENT_OTHER): Payer: Medicaid Other

## 2016-05-08 ENCOUNTER — Ambulatory Visit (HOSPITAL_BASED_OUTPATIENT_CLINIC_OR_DEPARTMENT_OTHER): Payer: Medicaid Other | Admitting: Oncology

## 2016-05-08 ENCOUNTER — Other Ambulatory Visit (HOSPITAL_BASED_OUTPATIENT_CLINIC_OR_DEPARTMENT_OTHER): Payer: Medicaid Other

## 2016-05-08 ENCOUNTER — Telehealth: Payer: Self-pay | Admitting: Oncology

## 2016-05-08 VITALS — BP 136/75 | HR 72 | Temp 98.1°F | Resp 19 | Wt 211.4 lb

## 2016-05-08 DIAGNOSIS — C642 Malignant neoplasm of left kidney, except renal pelvis: Secondary | ICD-10-CM | POA: Diagnosis not present

## 2016-05-08 DIAGNOSIS — C659 Malignant neoplasm of unspecified renal pelvis: Secondary | ICD-10-CM

## 2016-05-08 DIAGNOSIS — Z452 Encounter for adjustment and management of vascular access device: Secondary | ICD-10-CM

## 2016-05-08 DIAGNOSIS — C782 Secondary malignant neoplasm of pleura: Secondary | ICD-10-CM | POA: Diagnosis not present

## 2016-05-08 DIAGNOSIS — Z95828 Presence of other vascular implants and grafts: Secondary | ICD-10-CM

## 2016-05-08 DIAGNOSIS — D630 Anemia in neoplastic disease: Secondary | ICD-10-CM

## 2016-05-08 DIAGNOSIS — D6481 Anemia due to antineoplastic chemotherapy: Secondary | ICD-10-CM | POA: Diagnosis not present

## 2016-05-08 LAB — COMPREHENSIVE METABOLIC PANEL WITH GFR
ALT: 20 U/L (ref 0–55)
AST: 13 U/L (ref 5–34)
Albumin: 3.5 g/dL (ref 3.5–5.0)
Alkaline Phosphatase: 104 U/L (ref 40–150)
Anion Gap: 11 meq/L (ref 3–11)
BUN: 11.4 mg/dL (ref 7.0–26.0)
CO2: 24 meq/L (ref 22–29)
Calcium: 9.2 mg/dL (ref 8.4–10.4)
Chloride: 106 meq/L (ref 98–109)
Creatinine: 1.2 mg/dL (ref 0.7–1.3)
EGFR: 79 ml/min/1.73 m2 — ABNORMAL LOW
Glucose: 122 mg/dL (ref 70–140)
Potassium: 4.1 meq/L (ref 3.5–5.1)
Sodium: 141 meq/L (ref 136–145)
Total Bilirubin: 0.36 mg/dL (ref 0.20–1.20)
Total Protein: 7.1 g/dL (ref 6.4–8.3)

## 2016-05-08 LAB — CBC WITH DIFFERENTIAL/PLATELET
BASO%: 1 % (ref 0.0–2.0)
Basophils Absolute: 0.1 10e3/uL (ref 0.0–0.1)
EOS%: 1.6 % (ref 0.0–7.0)
Eosinophils Absolute: 0.1 10e3/uL (ref 0.0–0.5)
HCT: 36.3 % — ABNORMAL LOW (ref 38.4–49.9)
HGB: 12.1 g/dL — ABNORMAL LOW (ref 13.0–17.1)
LYMPH%: 29.3 % (ref 14.0–49.0)
MCH: 32.8 pg (ref 27.2–33.4)
MCHC: 33.5 g/dL (ref 32.0–36.0)
MCV: 98 fL (ref 79.3–98.0)
MONO#: 1.2 10e3/uL — ABNORMAL HIGH (ref 0.1–0.9)
MONO%: 16 % — ABNORMAL HIGH (ref 0.0–14.0)
NEUT#: 4.1 10e3/uL (ref 1.5–6.5)
NEUT%: 52.1 % (ref 39.0–75.0)
Platelets: 141 10e3/uL (ref 140–400)
RBC: 3.7 10e6/uL — ABNORMAL LOW (ref 4.20–5.82)
RDW: 17.7 % — ABNORMAL HIGH (ref 11.0–14.6)
WBC: 7.8 10e3/uL (ref 4.0–10.3)
lymph#: 2.3 10e3/uL (ref 0.9–3.3)

## 2016-05-08 MED ORDER — HEPARIN SOD (PORK) LOCK FLUSH 100 UNIT/ML IV SOLN
500.0000 [IU] | Freq: Once | INTRAVENOUS | Status: AC | PRN
  Administered 2016-05-08: 500 [IU] via INTRAVENOUS
  Filled 2016-05-08: qty 5

## 2016-05-08 MED ORDER — SODIUM CHLORIDE 0.9% FLUSH
10.0000 mL | INTRAVENOUS | Status: DC | PRN
  Administered 2016-05-08: 10 mL via INTRAVENOUS
  Filled 2016-05-08: qty 10

## 2016-05-08 NOTE — Telephone Encounter (Signed)
Scheduled appt per 3/22 los. Gave patient AVS and calender. [facility] to contact patient with CT schedule.

## 2016-05-08 NOTE — Progress Notes (Signed)
Hematology and Oncology Follow Up Visit  David [name] [id number] 13-Sep-1960 56 y.o. 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 He presented with a 5 cm mass in the central left kidney as well as pulmonary metastasis, nodules and pleural effusion.   Prior Therapy:   Status post biopsy obtained on 10/12/2015 which confirmed the presence of carcinoma. 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  Chemotherapy with gemcitabine and carboplatin cycle one  on 10/30/2015. He is status post 8 cycles of therapy.   Current therapy: Currently on observation and surveillance and a treatment holiday.  Interim History: David [name] presents today for a follow-up visit. Since the last visit, he reports no recent complaints. He continues to enjoy excellent quality of life and performance status since stopping chemotherapy. He reports his lower extremity edema has improved. He is not reporting any fevers or chills. He has not reported any major changes in his appetite. He is not reporting and his dyspnea on exertion or cough. He has resumed all activities of daily living at this time. He is eating well and have gained more weight.  He denied any headaches, blurry vision, syncope or seizures. He does report fevers but no chills.He does not report any chest pain, palpitation, orthopnea, leg edema or dyspnea on exertion. He denied any cough, wheezing or hemoptysis. He does not report any nausea, vomiting or early satiety. He did report constipation which have resolved. He denied any diarrhea. He does not report any frequency, urgency or hesitancy. He does not report any skeletal complaints. Remaining review of system is unremarkable.   Medications: I have reviewed the patient's current medications.  Current Outpatient Prescriptions  Medication Sig Dispense Refill  . carvedilol (COREG)  3.125 MG tablet take 1 tablet by mouth twice a day with meals 180 tablet 0  . chlorproMAZINE (THORAZINE) 50 MG tablet Take 1 tablet (50 mg total) by mouth 3 (three) times daily as needed for hiccoughs. 90 tablet 0  . clonazePAM (KLONOPIN) 0.5 MG tablet TAKE 1 TABLET BY MOUTH 1-2 TIMES DAILY AS NEEDED FOR ANXIETY. MAY CAUSE SEDATION 30 tablet 0  . cyanocobalamin (,VITAMIN B-12,) 1000 MCG/ML injection Please take intramuscular once weekly for 4 weeks, then change to once monthly after that 1 mL 12  . diphenhydrAMINE (BENADRYL) 25 mg capsule Take 25 mg by mouth every 6 (six) hours as needed for allergies.    Marland Kitchen doxycycline (VIBRA-TABS) 100 MG tablet Take 1 tablet (100 mg total) by mouth 2 (two) times daily. 20 tablet 0  . ferrous sulfate 325 (65 FE) MG tablet Take 1 tablet (325 mg total) by mouth 2 (two) times daily with a meal. 60 tablet 3  . glipiZIDE (GLUCOTROL) 5 MG tablet Take 1 tablet (5 mg total) by mouth daily before breakfast. 30 tablet 2  . lidocaine-prilocaine (EMLA) cream Apply 1 application topically as needed. 30 g 2  . megestrol (MEGACE) 400 MG/10ML suspension Take 10 mLs (400 mg total) by mouth 2 (two) times daily. 240 mL 5  . metFORMIN (GLUCOPHAGE) 500 MG tablet Take 1 tablet (500 mg total) by mouth 2 (two) times daily with a meal. 180 tablet 1  . mupirocin ointment (BACTROBAN) 2 % Apply 1 application topically 3 (three) times daily. 30 g 1  . oxyCODONE (OXY IR/ROXICODONE) 5 MG immediate release tablet Take 1 tablet (5 mg total) by mouth every 4 (four) hours as needed  for severe pain. 30 tablet 0  . pantoprazole (PROTONIX) 40 MG tablet Take 1 tablet (40 mg total) by mouth daily at 6 (six) AM. 30 tablet 3  . prochlorperazine (COMPAZINE) 10 MG tablet Take 1 tablet (10 mg total) by mouth every 6 (six) hours as needed for nausea or vomiting. 30 tablet 1   No current facility-administered medications for this visit.      Allergies: No Known Allergies  Past Medical History, Surgical  history, Social history, and Family History were reviewed and updated.   Physical Exam: Blood pressure 136/75, pulse 72, temperature 98.1 F (36.7 C), temperature source Oral, resp. rate 19, weight 211 lb 6.4 oz (95.9 kg), SpO2 100 %. ECOG: 1 General appearance: Alert, awake gentleman without distress. Head: Normocephalic, without obvious abnormality no ulcers or lesions. Neck: no adenopathy Lymph nodes: Cervical, supraclavicular, and axillary nodes normal. Heart:regular rate and rhythm, S1, S2 normal, no murmur, click, rub or gallop Lung:chest clear, no wheezing, rales, normal symmetric air entry Abdomin: soft, non-tender, without masses or organomegaly no shifting dullness or ascites. EXT:No edema noted in his extremities.   Lab Results: Lab Results  Component Value Date   WBC 7.8 05/08/2016   HGB 12.1 (L) 05/08/2016   HCT 36.3 (L) 05/08/2016   MCV 98.0 05/08/2016   PLT 141 05/08/2016     Chemistry      Component Value Date/Time   NA 141 05/08/2016 0821   K 4.1 05/08/2016 0821   CL 106 03/19/2016 2120   CO2 24 05/08/2016 0821   BUN 11.4 05/08/2016 0821   CREATININE 1.2 05/08/2016 0821      Component Value Date/Time   CALCIUM 9.2 05/08/2016 0821   ALKPHOS 104 05/08/2016 0821   AST 13 05/08/2016 0821   ALT 20 05/08/2016 0821   BILITOT 0.36 05/08/2016 0821        Impression and Plan:  56 year old gentleman with the following issues:  1. 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 He is status post biopsy of a pleural-based nodule which confirmed the presence of carcinoma although further immunohistochemical staining was not performed because of lack of tissue.  These findings confirm the presence of advanced malignancy Arising from the renal pelvis.  He is currently receiving salvage chemotherapy with carboplatin and gemcitabine. Chemotherapy has been well tolerated with dramatic improvement in  his quality of life.  CT scan on 11/09/2017showed a positive response with decrease of the volume of disease at this time.  CT scan obtained on 03/03/2016 continues to show excellent response to therapy.   He completed 8 cycles of therapy with cycle 8 of chemotherapy was given with gemcitabine only.   He is currently on a treatment holiday and have been doing very well. He has no signs symptoms to suggest recurrent disease. Plan is to continue on a treatment holiday and repeat imaging studies in 6 weeks. This visit was therapy will be used upon progression.  2. IV access: Port-A-Cath has been utilized without complications. This will be flushed every 6 weeks.  3. Nausea prophylaxis: No issues reported at this time.  4. Lower extremity edema: Resolved since discontinuation of chemotherapy.  5. Anemia: Related to malignancy and chemotherapy infusion. Hemoglobin has normalized at this time.  6. Myositis with elevated stable CK. Appears to have improved at this time. His could be related to carboplatin which will be discontinued at this time.  7. Follow-up: Will be in 6  weeks to repeat imaging studies.  [facility], MD 3/22/20189:41 AM

## 2016-05-26 ENCOUNTER — Other Ambulatory Visit: Payer: Self-pay | Admitting: Oncology

## 2016-05-26 DIAGNOSIS — C659 Malignant neoplasm of unspecified renal pelvis: Secondary | ICD-10-CM

## 2016-05-26 MED ORDER — OXYCODONE HCL 5 MG PO TABS: 5.0000 mg | ORAL_TABLET | ORAL | 0 refills | Status: DC | PRN

## 2016-05-26 NOTE — Telephone Encounter (Signed)
Wife vonetta calling to request refills on klonopin and oxycodone

## 2016-05-27 ENCOUNTER — Telehealth: Payer: Self-pay | Admitting: *Deleted

## 2016-05-27 ENCOUNTER — Other Ambulatory Visit: Payer: Self-pay | Admitting: *Deleted

## 2016-05-27 NOTE — Telephone Encounter (Signed)
Spouse Vernette called from work.  "David C/O having lower back pain since Sunday.  Also having a problem being hot and cold but no fever.  Temp = 97.0.  Has breakout sweats and chills especially at night.  Should he see PCP or Dr. Alen Blew about this.  Has been off chemotherapy since February 20 th.  Has cancer to his kidney so he thinks his electrolytes might be off.  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  Vacuuming her home Saturday, then Sunday he complained about his back.  Didn't tell me which side, just lower back.  Return number [phone]."

## 2016-05-29 ENCOUNTER — Encounter: Payer: Self-pay | Admitting: Medical

## 2016-05-29 ENCOUNTER — Ambulatory Visit (INDEPENDENT_AMBULATORY_CARE_PROVIDER_SITE_OTHER): Payer: Medicaid Other | Admitting: Medical

## 2016-05-29 VITALS — BP 124/80 | HR 72 | Wt 205.6 lb

## 2016-05-29 DIAGNOSIS — R6883 Chills (without fever): Secondary | ICD-10-CM | POA: Diagnosis not present

## 2016-05-29 DIAGNOSIS — R61 Generalized hyperhidrosis: Secondary | ICD-10-CM

## 2016-05-29 DIAGNOSIS — R6882 Decreased libido: Secondary | ICD-10-CM | POA: Diagnosis not present

## 2016-05-29 DIAGNOSIS — N529 Male erectile dysfunction, unspecified: Secondary | ICD-10-CM | POA: Diagnosis not present

## 2016-05-29 DIAGNOSIS — E118 Type 2 diabetes mellitus with unspecified complications: Secondary | ICD-10-CM

## 2016-05-29 LAB — TSH: TSH: 0.91 m[IU]/L (ref 0.40–4.50)

## 2016-05-29 MED ORDER — TADALAFIL 20 MG PO TABS: 10.0000 mg | ORAL_TABLET | ORAL | 0 refills | Status: DC | PRN

## 2016-05-29 NOTE — Progress Notes (Signed)
Subjective: Chief Complaint  Patient presents with  . night sweats    night sweats , and cold chills,  discuss tst levels    Here for c/o night sweats, cold chills since he stopped chemo 4-5 weeks ago.  happens a few times per week.  Doesn't go back to recheck on cancer until May depending upon repeat scan.  No nausea, no vomiting, no bowel or urinary changes, no bleeding, no bruising,no fevers.    Has problems getting and keeping erections.   This has been going on for a few years.  Thinks its his testosterone level.  Has no sex drive for months.  His level was low in past years, but couldn't afford medication for testosterone.  Has tried boot leg Viagra in the past but that was years ago.     Has appetite. Been hanging at 205lb.      Review of Systems Constitutional: -fever, +chills, +sweats, -unexpected weight change,-fatigue ENT: -runny nose, -ear pain, -sore throat Cardiology:  -chest pain, -palpitations, -edema Respiratory: -cough, -shortness of breath, -wheezing Gastroenterology: -abdominal pain, -nausea, -vomiting, -diarrhea, -constipation Hematology: -bleeding or bruising problems Musculoskeletal: -arthralgias, -myalgias, -joint swelling, -back pain Ophthalmology: -vision changes Urology: -dysuria, -difficulty urinating, -hematuria, -urinary frequency, -urgency Neurology: -headache, -weakness, -tingling, -numbness    Past Medical History:  Diagnosis Date  . Cancer ([place])   . Dental caries   . Diabetes mellitus, type II ([place]) 01/2012  . Hyperlipidemia   . Influenza vaccine refused 01/18/2013  . Kidney cancer, primary, with metastasis from kidney to other site [facility])   . Obesity   . Pneumococcal vaccine refused 01/18/2013  . Scoliosis   . Tobacco use   . Vision impairment    left, due to prior trauma   Current Outpatient Prescriptions on File Prior to Visit  Medication Sig Dispense Refill  . carvedilol (COREG) 3.125 MG tablet take 1 tablet by mouth twice a day with  meals 180 tablet 0  . chlorproMAZINE (THORAZINE) 50 MG tablet Take 1 tablet (50 mg total) by mouth 3 (three) times daily as needed for hiccoughs. 90 tablet 0  . clonazePAM (KLONOPIN) 0.5 MG tablet take 1 tablet by mouth 1 TO 2 TIMES DAILY AS NEEDED FOR ANXIETY.MAY CAUSE SEDATION 30 tablet 0  . lidocaine-prilocaine (EMLA) cream Apply 1 application topically as needed. 30 g 2  . metFORMIN (GLUCOPHAGE) 500 MG tablet Take 1 tablet (500 mg total) by mouth 2 (two) times daily with a meal. 180 tablet 1  . mupirocin ointment (BACTROBAN) 2 % Apply 1 application topically 3 (three) times daily. 30 g 1  . oxyCODONE (OXY IR/ROXICODONE) 5 MG immediate release tablet Take 1 tablet (5 mg total) by mouth every 4 (four) hours as needed for severe pain. 30 tablet 0   No current facility-administered medications on file prior to visit.      Objective: BP 124/80   Pulse 72   Wt 205 lb 9.6 oz (93.3 kg)   SpO2 97%   BMI 30.36 kg/m   Wt Readings from Last 3 Encounters:  05/29/16 205 lb 9.6 oz (93.3 kg)  05/08/16 211 lb 6.4 oz (95.9 kg)  04/15/16 205 lb 11.2 oz (93.3 kg)   General appearance: alert, no distress, WD/WN,  Oral cavity: MMM, no lesions Neck: supple, no lymphadenopathy, no thyromegaly, no masses Heart: RRR, normal S1, S2, no murmurs Lungs: CTA bilaterally, no wheezes, rhonchi, or rales Abdomen: +bs, soft, non tender, non distended, no masses, no hepatomegaly, no splenomegaly Pulses: 2+ symmetric,  upper and lower extremities, normal cap refill     Assessment: Encounter Diagnoses  Name Primary?  . Low libido Yes  . Erectile dysfunction, unspecified erectile dysfunction type   . Night sweat   . Chills   . Type 2 diabetes mellitus with complication, without long-term current use of insulin (HCC)     Plan: Diabetes - repeat HgbA1C, monitor glucose at home, c/t current medications, c/t healthy diet, exercise  Low libido - TST lab  Erectile Dysfunction - Reviewed pathophysiology and  differential diagnosis of erectile dysfunction with the patient.  Discussed treatment options.  Begin trial of Cialis.  Discussed potential risks of medications including hypotension and priapism.  Discussed proper use of medication.  Questions were answered.  Recheck with call back in a few days  Night sweats, chills - advised he contact oncology if this worsens.  If not improving or continuing symptoms in the next 2-3 weeks, can recheck CBC, BMET  Adal was seen today for night sweats.  Diagnoses and all orders for this visit:  Low libido -     Testosterone  Erectile dysfunction, unspecified erectile dysfunction type -     TSH -     Testosterone  Night sweat  Chills -     TSH  Type 2 diabetes mellitus with complication, without long-term current use of insulin (HCC) -     TSH -     Hemoglobin A1c  Other orders -     tadalafil (CIALIS) 20 MG tablet; Take 0.5-1 tablets (10-20 mg total) by mouth every other day as needed for erectile dysfunction.

## 2016-05-30 LAB — HEMOGLOBIN A1C
Hgb A1c MFr Bld: 5.1 % (ref <5.7)
MEAN PLASMA GLUCOSE: 100 mg/dL

## 2016-05-30 LAB — TESTOSTERONE: Testosterone: 198 ng/dL — ABNORMAL LOW (ref 250–827)

## 2016-06-03 ENCOUNTER — Telehealth: Payer: Self-pay

## 2016-06-03 ENCOUNTER — Other Ambulatory Visit: Payer: Self-pay | Admitting: Medical

## 2016-06-03 MED ORDER — TESTOSTERONE CYPIONATE 200 MG/ML IM SOLN: 200.0000 mg | INTRAMUSCULAR | 0 refills | Status: DC

## 2016-06-03 NOTE — Telephone Encounter (Signed)
pls call out testosteone multi use vial, have him bring in for injection q2 wk for next 6 weeks

## 2016-06-03 NOTE — Telephone Encounter (Signed)
Called in rx to [facility]

## 2016-06-03 NOTE — Telephone Encounter (Signed)
Spoke pt's wife she said that he is willing to do the Testerone shots every 14 day, and that the Cialis did not help. He wants [facility] to [facility].

## 2016-06-05 ENCOUNTER — Other Ambulatory Visit (INDEPENDENT_AMBULATORY_CARE_PROVIDER_SITE_OTHER): Payer: Medicaid Other

## 2016-06-05 DIAGNOSIS — R7989 Other specified abnormal findings of blood chemistry: Secondary | ICD-10-CM

## 2016-06-05 DIAGNOSIS — E291 Testicular hypofunction: Secondary | ICD-10-CM | POA: Diagnosis not present

## 2016-06-05 MED ORDER — TESTOSTERONE CYPIONATE 200 MG/ML IM SOLN
200.0000 mg | INTRAMUSCULAR | Status: DC
  Administered 2016-06-05 – 2016-06-19 (×2): 200 mg via INTRAMUSCULAR

## 2016-06-10 ENCOUNTER — Encounter: Payer: Self-pay | Admitting: *Deleted

## 2016-06-10 ENCOUNTER — Encounter (HOSPITAL_COMMUNITY): Payer: Self-pay | Admitting: Emergency Medicine

## 2016-06-10 ENCOUNTER — Emergency Department (HOSPITAL_COMMUNITY)
Admission: EM | Admit: 2016-06-10 | Discharge: 2016-06-10 | Disposition: A | Discharge status: Home / Self Care | Payer: Medicaid Other | Attending: Emergency Medicine | Admitting: Emergency Medicine

## 2016-06-10 ENCOUNTER — Telehealth: Payer: Self-pay | Admitting: *Deleted

## 2016-06-10 DIAGNOSIS — Z87891 Personal history of nicotine dependence: Secondary | ICD-10-CM | POA: Insufficient documentation

## 2016-06-10 DIAGNOSIS — B379 Candidiasis, unspecified: Secondary | ICD-10-CM | POA: Diagnosis not present

## 2016-06-10 DIAGNOSIS — Z7984 Long term (current) use of oral hypoglycemic drugs: Secondary | ICD-10-CM | POA: Insufficient documentation

## 2016-06-10 DIAGNOSIS — Z85528 Personal history of other malignant neoplasm of kidney: Secondary | ICD-10-CM | POA: Insufficient documentation

## 2016-06-10 DIAGNOSIS — E119 Type 2 diabetes mellitus without complications: Secondary | ICD-10-CM | POA: Diagnosis not present

## 2016-06-10 DIAGNOSIS — Z79899 Other long term (current) drug therapy: Secondary | ICD-10-CM | POA: Diagnosis not present

## 2016-06-10 DIAGNOSIS — B370 Candidal stomatitis: Secondary | ICD-10-CM

## 2016-06-10 DIAGNOSIS — R531 Weakness: Secondary | ICD-10-CM | POA: Diagnosis present

## 2016-06-10 DIAGNOSIS — E860 Dehydration: Secondary | ICD-10-CM | POA: Insufficient documentation

## 2016-06-10 LAB — CBC
HCT: 29.3 % — ABNORMAL LOW (ref 39.0–52.0)
Hemoglobin: 9.4 g/dL — ABNORMAL LOW (ref 13.0–17.0)
MCH: 30.4 pg (ref 26.0–34.0)
MCHC: 32.1 g/dL (ref 30.0–36.0)
MCV: 94.8 fL (ref 78.0–100.0)
PLATELETS: 397 10*3/uL (ref 150–400)
RBC: 3.09 MIL/uL — ABNORMAL LOW (ref 4.22–5.81)
RDW: 14.8 % (ref 11.5–15.5)
WBC: 8.8 10*3/uL (ref 4.0–10.5)

## 2016-06-10 LAB — URINALYSIS, ROUTINE W REFLEX MICROSCOPIC
Bilirubin Urine: NEGATIVE
GLUCOSE, UA: NEGATIVE mg/dL
HGB URINE DIPSTICK: NEGATIVE
KETONES UR: NEGATIVE mg/dL
Leukocytes, UA: NEGATIVE
Nitrite: NEGATIVE
PROTEIN: NEGATIVE mg/dL
Specific Gravity, Urine: 1.005 (ref 1.005–1.030)
pH: 6 (ref 5.0–8.0)

## 2016-06-10 LAB — BASIC METABOLIC PANEL
Anion gap: 10 (ref 5–15)
BUN: 15 mg/dL (ref 6–20)
CHLORIDE: 100 mmol/L — AB (ref 101–111)
CO2: 24 mmol/L (ref 22–32)
CREATININE: 1.23 mg/dL (ref 0.61–1.24)
Calcium: 8.7 mg/dL — ABNORMAL LOW (ref 8.9–10.3)
GFR calc Af Amer: >60 mL/min (ref >60)
GFR calc non Af Amer: >60 mL/min (ref >60)
Glucose, Bld: 93 mg/dL (ref 65–99)
POTASSIUM: 4.2 mmol/L (ref 3.5–5.1)
SODIUM: 134 mmol/L — AB (ref 135–145)

## 2016-06-10 LAB — CBG MONITORING, ED: Glucose-Capillary: 86 mg/dL (ref 65–99)

## 2016-06-10 MED ORDER — NYSTATIN 100000 UNIT/ML MT SUSP: 5.0000 mL | Freq: Four times a day (QID) | OROMUCOSAL | 0 refills | Status: DC

## 2016-06-10 MED ORDER — CHLORPROMAZINE HCL 25 MG PO TABS
50.0000 mg | ORAL_TABLET | Freq: Once | ORAL | Status: AC
  Administered 2016-06-10: 50 mg via ORAL
  Filled 2016-06-10: qty 2

## 2016-06-10 MED ORDER — HEPARIN SOD (PORK) LOCK FLUSH 100 UNIT/ML IV SOLN
500.0000 [IU] | Freq: Once | INTRAVENOUS | Status: AC
  Administered 2016-06-10: 500 [IU]
  Filled 2016-06-10: qty 5

## 2016-06-10 MED ORDER — SODIUM CHLORIDE 0.9 % IV BOLUS (SEPSIS)
1000.0000 mL | Freq: Once | INTRAVENOUS | Status: AC
  Administered 2016-06-10: 1000 mL via INTRAVENOUS

## 2016-06-10 NOTE — ED Notes (Signed)
PT DISCHARGED. INSTRUCTIONS AND PRESCRIPTION GIVEN. AAOX4. PT IN NO APPARENT DISTRESS OR PAIN. THE OPPORTUNITY TO ASK QUESTIONS WAS PROVIDED. 

## 2016-06-10 NOTE — ED Provider Notes (Signed)
[facility] DEPT Provider Note   CSN: [id number] Arrival date & time: 06/10/16  1154     History   Chief Complaint Chief Complaint  Patient presents with  . Weakness; cancer pt    HPI Echo Masi is a 56 y.o. male.  The history is provided by the patient and the spouse.  Weakness  Primary symptoms include no focal weakness. Primary symptoms comment: loss of appetite. This is a chronic problem. Episode onset: 4 weeks or more. The problem has not changed since onset.There was no focality noted. There has been no fever. Pertinent negatives include no shortness of breath, no chest pain, no vomiting and no altered mental status. There were no medications administered prior to arrival. Associated medical issues comments: metastatic cancer.    Past Medical History:  Diagnosis Date  . Cancer ([place])   . Dental caries   . Diabetes mellitus, type II ([place]) 01/2012  . Hyperlipidemia   . Influenza vaccine refused 01/18/2013  . Kidney cancer, primary, with metastasis from kidney to other site [facility])   . Obesity   . Pneumococcal vaccine refused 01/18/2013  . Scoliosis   . Tobacco use   . Vision impairment    left, due to prior trauma    Patient Active Problem List   Diagnosis Date Noted  . Abscess of buttock, right 03/24/2016  . Elevated CK 03/24/2016  . Myalgia 03/24/2016  . Edema 03/24/2016  . Folliculitis 89/37/3428  . Rash 03/24/2016  . Acute serous otitis media 03/17/2016  . Port catheter in place 12/11/2015  . Protein-calorie malnutrition, severe 11/15/2015  . Hyponatremia 11/14/2015  . Hypoalbuminemia due to protein-calorie malnutrition ([place]) 11/14/2015  . Generalized weakness 11/14/2015  . Mild dehydration 11/14/2015  . SIRS (systemic inflammatory response syndrome) (HCC)   . S/P thoracentesis   . Fever, unspecified 10/25/2015  . Pleural effusion on right 10/25/2015  . Pleural mass 10/25/2015  . Symptomatic anemia 10/24/2015  . Primary cancer of renal pelvis ([place])  10/18/2015  . Abnormal CT of the chest 10/03/2015  . Screening for prostate cancer 10/03/2015  . Creatinine elevation 10/03/2015  . Pulmonary nodules 10/03/2015  . Loss of weight 10/03/2015  . Lymphadenopathy 10/03/2015  . History of colonic polyps 10/03/2015  . Leukocytosis 10/03/2015  . Anemia, unspecified 10/03/2015  . Former smoker 10/03/2015  . Type 2 diabetes mellitus with complication, without long-term current use of insulin ([place]) 01/18/2013  . Obesity, unspecified 01/18/2013    Past Surgical History:  Procedure Laterality Date  . COLONOSCOPY W/ POLYPECTOMY    . EYE SURGERY  1996   due to trauma, left eye  . IR GENERIC HISTORICAL  11/26/2015   IR US GUIDE VASC ACCESS RIGHT 11/26/2015 [facility]  . IR GENERIC HISTORICAL  11/26/2015   IR FLUORO GUIDE PORT INSERTION RIGHT 11/26/2015 [facility]       Home Medications    Prior to Admission medications   Medication Sig Start Date End Date Taking? Authorizing Provider  carvedilol (COREG) 3.125 MG tablet take 1 tablet by mouth twice a day with meals 03/31/16  Yes Camelia Eng Tysinger, PA-C  chlorproMAZINE (THORAZINE) 50 MG tablet Take 1 tablet (50 mg total) by mouth 3 (three) times daily as needed for hiccoughs. 10/28/15  Yes Debbe Odea, MD  clonazePAM (KLONOPIN) 0.5 MG tablet take 1 tablet by mouth 1 TO 2 TIMES DAILY AS NEEDED FOR ANXIETY.MAY CAUSE SEDATION Patient taking differently: Take 1 tablet by mouth every day 05/27/16  Yes Wyatt Portela, MD  lidocaine-prilocaine (EMLA) cream Apply 1 application topically as needed. Patient taking differently: Apply 1 application topically as needed (for port access).  04/15/16  Yes Wyatt Portela, MD  metFORMIN (GLUCOPHAGE) 500 MG tablet Take 1 tablet (500 mg total) by mouth 2 (two) times daily with a meal. 01/29/16  Yes Camelia Eng Tysinger, PA-C  Multiple Vitamins-Minerals (MULTI-VITAMIN GUMMIES PO) Take 1 each by mouth daily.    Yes Historical Provider, MD  mupirocin ointment (BACTROBAN)  2 % Apply 1 application topically 3 (three) times daily. Patient taking differently: Apply 1 application topically daily as needed (for rash).  03/24/16  Yes Camelia Eng Tysinger, PA-C  oxyCODONE (OXY IR/ROXICODONE) 5 MG immediate release tablet Take 1 tablet (5 mg total) by mouth every 4 (four) hours as needed for severe pain. 05/26/16  Yes Wyatt Portela, MD  tadalafil (CIALIS) 20 MG tablet Take 0.5-1 tablets (10-20 mg total) by mouth every other day as needed for erectile dysfunction. 05/29/16  Yes Camelia Eng Tysinger, PA-C  testosterone cypionate (DEPOTESTOSTERONE CYPIONATE) 200 MG/ML injection Inject 1 mL (200 mg total) into the muscle every 14 (fourteen) days. 06/03/16  Yes Carlena Hurl, PA-C    Family History Family History  Problem Relation Age of Onset  . Diabetes Sister   . Other Sister     gastric bypass  . Hyperlipidemia Mother   . Diabetes Mother   . Cancer Father 53    prostate cancer  . Stroke Maternal Grandmother   . Heart disease Neg Hx   . Hypertension Neg Hx   . Colon cancer Neg Hx     Social History Social History  Substance Use Topics  . Smoking status: Former Smoker    Packs/day: 0.25    Years: 20.00    Types: Cigarettes  . Smokeless tobacco: Never Used  . Alcohol use No     Allergies   Patient has no known allergies.   Review of Systems Review of Systems  Respiratory: Negative for shortness of breath.   Cardiovascular: Negative for chest pain.  Gastrointestinal: Negative for vomiting.  Neurological: Positive for weakness. Negative for focal weakness.  All other systems reviewed and are negative.    Physical Exam Updated Vital Signs BP 112/70 (BP Location: Right Arm)   Pulse 94   Temp 98.1 F (36.7 C) (Oral)   Resp 18   SpO2 96%   Physical Exam  Constitutional: He is oriented to person, place, and time. He appears well-developed and well-nourished. No distress.  HENT:  Head: Normocephalic and atraumatic.  Nose: Nose normal.  Eyes:  Conjunctivae are normal.  Neck: Neck supple. No tracheal deviation present.  Cardiovascular: Normal rate, regular rhythm and normal heart sounds.   Pulmonary/Chest: Effort normal and breath sounds normal. No respiratory distress.  Abdominal: Soft. He exhibits no distension. There is no tenderness. There is no guarding.  Neurological: He is alert and oriented to person, place, and time.  Skin: Skin is warm and dry.  Psychiatric: He has a normal mood and affect.  Vitals reviewed.    ED Treatments / Results  Labs (all labs ordered are listed, but only abnormal results are displayed) Labs Reviewed  BASIC METABOLIC PANEL - Abnormal; Notable for the following:       Result Value   Sodium 134 (*)    Chloride 100 (*)    Calcium 8.7 (*)    All other components within normal limits  CBC - Abnormal; Notable for the following:    RBC  3.09 (*)    Hemoglobin 9.4 (*)    HCT 29.3 (*)    All other components within normal limits  URINALYSIS, ROUTINE W REFLEX MICROSCOPIC - Abnormal; Notable for the following:    Color, Urine STRAW (*)    All other components within normal limits  CBG MONITORING, ED    EKG  EKG Interpretation  Date/Time:  Tuesday June 10 2016 13:50:45 EDT Ventricular Rate:  86 PR Interval:    QRS Duration: 85 QT Interval:  371 QTC Calculation: 444 R Axis:   83 Text Interpretation:  Sinus rhythm Consider left ventricular hypertrophy No significant change since last tracing Confirmed by Kaci Freel MD, Jourdain Guay ([id number]) on 06/10/2016 2:31:49 PM       Radiology No results found.  Procedures Procedures (including critical care time)  Medications Ordered in ED Medications  sodium chloride 0.9 % bolus 1,000 mL (1,000 mLs Intravenous New Bag/Given 06/10/16 1538)  chlorproMAZINE (THORAZINE) tablet 50 mg (50 mg Oral Given 06/10/16 1548)     Initial Impression / Assessment and Plan / ED Course  I have reviewed the triage vital signs and the nursing notes.  Pertinent labs &  imaging results that were available during my care of the patient were reviewed by me and considered in my medical decision making (see chart for details).     56 y.o. male presents with ongoing weakness and poor appetite. He has history of metastatic cancer and undergoing chemotherapy. He also has some foul breath and white plaques on his tongue which we will treat with nystatin suspension. He was brought in today at the insistence of his wife because they are concerned his cancer is getting worse. His labs are reassuring and he is chronically ill appearing and slightly dehydrated clinically so given IVF. Provided thorazine for chronic hiccups. Discussed balanced diet and light activity, they are working to move up his appointment with oncology which I agree with.   Final Clinical Impressions(s) / ED Diagnoses   Final diagnoses:  Mild dehydration  Thrush    New Prescriptions Discharge Medication List as of 06/10/2016  4:33 PM    START taking these medications   Details  nystatin (MYCOSTATIN) 100000 UNIT/ML suspension Use as directed 5 mLs (500,000 Units total) in the mouth or throat 4 (four) times daily., Starting Tue 06/10/2016, Print         Leo Grosser, MD 06/10/16 [phone]

## 2016-06-10 NOTE — Telephone Encounter (Signed)
Wife calling to say patient has been weak, no energy, not eating and a white coating on his tongue for 3 days. she thinks patient may need a blood transfusion. Per dr Alen Blew he is to report to the E.D. Wife notified.

## 2016-06-10 NOTE — ED Triage Notes (Signed)
Pt is a cancer pt and comes in with complaints of weakness today. Pt daughter states pt has been SOB with ambulation and has had consistent hiccups. Pt states he hasn't had chemotherapy in 4.5 weeks and was told he would restart the chemotherapy treatment in 2 more weeks. Pt reports receiving chemo since September and has had to receive blood transfusions every 3 weeks. Denies n/v/d and pain at this time.

## 2016-06-18 ENCOUNTER — Other Ambulatory Visit (HOSPITAL_BASED_OUTPATIENT_CLINIC_OR_DEPARTMENT_OTHER): Payer: Medicaid Other

## 2016-06-18 ENCOUNTER — Telehealth: Payer: Self-pay | Admitting: *Deleted

## 2016-06-18 ENCOUNTER — Encounter (HOSPITAL_COMMUNITY): Payer: Self-pay

## 2016-06-18 ENCOUNTER — Ambulatory Visit (HOSPITAL_BASED_OUTPATIENT_CLINIC_OR_DEPARTMENT_OTHER): Payer: Medicaid Other

## 2016-06-18 ENCOUNTER — Ambulatory Visit (HOSPITAL_COMMUNITY)
Admission: RE | Admit: 2016-06-18 | Discharge: 2016-06-18 | Disposition: A | Discharge status: Home / Self Care | Payer: Medicaid Other | Source: Ambulatory Visit | Attending: Oncology | Admitting: Oncology

## 2016-06-18 DIAGNOSIS — Z452 Encounter for adjustment and management of vascular access device: Secondary | ICD-10-CM

## 2016-06-18 DIAGNOSIS — K8020 Calculus of gallbladder without cholecystitis without obstruction: Secondary | ICD-10-CM | POA: Diagnosis not present

## 2016-06-18 DIAGNOSIS — M899 Disorder of bone, unspecified: Secondary | ICD-10-CM | POA: Insufficient documentation

## 2016-06-18 DIAGNOSIS — I517 Cardiomegaly: Secondary | ICD-10-CM | POA: Insufficient documentation

## 2016-06-18 DIAGNOSIS — J910 Malignant pleural effusion: Secondary | ICD-10-CM | POA: Insufficient documentation

## 2016-06-18 DIAGNOSIS — C782 Secondary malignant neoplasm of pleura: Secondary | ICD-10-CM | POA: Diagnosis not present

## 2016-06-18 DIAGNOSIS — R935 Abnormal findings on diagnostic imaging of other abdominal regions, including retroperitoneum: Secondary | ICD-10-CM | POA: Diagnosis not present

## 2016-06-18 DIAGNOSIS — C659 Malignant neoplasm of unspecified renal pelvis: Secondary | ICD-10-CM

## 2016-06-18 DIAGNOSIS — M5137 Other intervertebral disc degeneration, lumbosacral region: Secondary | ICD-10-CM | POA: Insufficient documentation

## 2016-06-18 DIAGNOSIS — C642 Malignant neoplasm of left kidney, except renal pelvis: Secondary | ICD-10-CM | POA: Diagnosis not present

## 2016-06-18 DIAGNOSIS — Z95828 Presence of other vascular implants and grafts: Secondary | ICD-10-CM

## 2016-06-18 HISTORY — DX: Essential (primary) hypertension: I10

## 2016-06-18 LAB — COMPREHENSIVE METABOLIC PANEL
ALBUMIN: 2.4 g/dL — AB (ref 3.5–5.0)
ALT: 13 U/L (ref 0–55)
ANION GAP: 12 meq/L — AB (ref 3–11)
AST: 9 U/L (ref 5–34)
Alkaline Phosphatase: 107 U/L (ref 40–150)
BILIRUBIN TOTAL: 0.4 mg/dL (ref 0.20–1.20)
BUN: 14.6 mg/dL (ref 7.0–26.0)
CO2: 23 mEq/L (ref 22–29)
CREATININE: 0.9 mg/dL (ref 0.7–1.3)
Calcium: 9.6 mg/dL (ref 8.4–10.4)
Chloride: 105 mEq/L (ref 98–109)
EGFR: >90 mL/min/{1.73_m2} (ref >90)
GLUCOSE: 120 mg/dL (ref 70–140)
Potassium: 4.2 mEq/L (ref 3.5–5.1)
Sodium: 140 mEq/L (ref 136–145)
TOTAL PROTEIN: 7.4 g/dL (ref 6.4–8.3)

## 2016-06-18 LAB — CBC WITH DIFFERENTIAL/PLATELET
BASO%: 0.3 % (ref 0.0–2.0)
Basophils Absolute: 0 10*3/uL (ref 0.0–0.1)
EOS ABS: 0 10*3/uL (ref 0.0–0.5)
EOS%: 0.2 % (ref 0.0–7.0)
HEMATOCRIT: 26.3 % — AB (ref 38.4–49.9)
HGB: 8.4 g/dL — ABNORMAL LOW (ref 13.0–17.1)
LYMPH%: 18.3 % (ref 14.0–49.0)
MCH: 30.3 pg (ref 27.2–33.4)
MCHC: 31.9 g/dL — ABNORMAL LOW (ref 32.0–36.0)
MCV: 94.9 fL (ref 79.3–98.0)
MONO#: 1.5 10*3/uL — ABNORMAL HIGH (ref 0.1–0.9)
MONO%: 14.8 % — ABNORMAL HIGH (ref 0.0–14.0)
NEUT%: 66.4 % (ref 39.0–75.0)
NEUTROS ABS: 6.8 10*3/uL — AB (ref 1.5–6.5)
NRBC: 0 % (ref 0–0)
PLATELETS: 392 10*3/uL (ref 140–400)
RBC: 2.77 10*6/uL — ABNORMAL LOW (ref 4.20–5.82)
RDW: 15.5 % — AB (ref 11.0–14.6)
WBC: 10.3 10*3/uL (ref 4.0–10.3)
lymph#: 1.9 10*3/uL (ref 0.9–3.3)

## 2016-06-18 MED ORDER — IOPAMIDOL (ISOVUE-300) INJECTION 61%
INTRAVENOUS | Status: AC
  Administered 2016-06-18: 100 mL
  Filled 2016-06-18: qty 100

## 2016-06-18 MED ORDER — IOPAMIDOL (ISOVUE-300) INJECTION 61%: 100.0000 mL | Freq: Once | INTRAVENOUS | Status: DC | PRN

## 2016-06-18 MED ORDER — HEPARIN SOD (PORK) LOCK FLUSH 100 UNIT/ML IV SOLN
INTRAVENOUS | Status: AC
  Administered 2016-06-18: 500 [IU]
  Filled 2016-06-18: qty 5

## 2016-06-18 MED ORDER — SODIUM CHLORIDE 0.9% FLUSH
10.0000 mL | INTRAVENOUS | Status: DC | PRN
  Administered 2016-06-18: 10 mL via INTRAVENOUS
  Filled 2016-06-18: qty 10

## 2016-06-18 NOTE — Telephone Encounter (Signed)
Wife of patient called stating that patient just received labs today (Hgb: 8.4). Patient feels SOB/fatigue and would like blood today if possible.

## 2016-06-18 NOTE — Telephone Encounter (Signed)
Ok to give 2 units please.

## 2016-06-18 NOTE — Telephone Encounter (Signed)
Called patient and left message asking him to call [facility] so, we can schedule a blood transfusion.

## 2016-06-19 ENCOUNTER — Ambulatory Visit (HOSPITAL_COMMUNITY)
Admission: RE | Admit: 2016-06-19 | Discharge: 2016-06-19 | Disposition: A | Discharge status: Home / Self Care | Payer: Medicaid Other | Source: Ambulatory Visit | Attending: Oncology | Admitting: Oncology

## 2016-06-19 ENCOUNTER — Telehealth: Payer: Self-pay

## 2016-06-19 ENCOUNTER — Other Ambulatory Visit (INDEPENDENT_AMBULATORY_CARE_PROVIDER_SITE_OTHER): Payer: Medicaid Other

## 2016-06-19 ENCOUNTER — Ambulatory Visit: Payer: Medicaid Other

## 2016-06-19 ENCOUNTER — Other Ambulatory Visit: Payer: Self-pay | Admitting: *Deleted

## 2016-06-19 ENCOUNTER — Other Ambulatory Visit: Payer: Self-pay

## 2016-06-19 DIAGNOSIS — C659 Malignant neoplasm of unspecified renal pelvis: Secondary | ICD-10-CM

## 2016-06-19 DIAGNOSIS — R7989 Other specified abnormal findings of blood chemistry: Secondary | ICD-10-CM

## 2016-06-19 DIAGNOSIS — D649 Anemia, unspecified: Secondary | ICD-10-CM | POA: Insufficient documentation

## 2016-06-19 MED ORDER — TESTOSTERONE CYPIONATE 200 MG/ML IM SOLN: 200.0000 mg | INTRAMUSCULAR | 2 refills | Status: DC

## 2016-06-19 NOTE — Telephone Encounter (Signed)
Wife returned call from yesterday re pt needing blood transfusion. Took information that pt is willing to wait for tomorrow after his 0900 appt with Dr Alen Blew if need be. He is also available to come today. Forwarded information to [employer] with Dr Alen Blew.

## 2016-06-19 NOTE — Telephone Encounter (Signed)
Pt states he needs refill of testosterone called to [facility]. He will then schedule 2 week inj after picking up new vials.

## 2016-06-19 NOTE — Telephone Encounter (Signed)
Called into pharmacy

## 2016-06-20 ENCOUNTER — Other Ambulatory Visit: Payer: Self-pay | Admitting: *Deleted

## 2016-06-20 ENCOUNTER — Telehealth: Payer: Self-pay | Admitting: Oncology

## 2016-06-20 ENCOUNTER — Ambulatory Visit (HOSPITAL_COMMUNITY)
Admission: RE | Admit: 2016-06-20 | Discharge: 2016-06-20 | Disposition: A | Discharge status: Home / Self Care | Payer: Medicaid Other | Source: Ambulatory Visit | Attending: Oncology | Admitting: Oncology

## 2016-06-20 ENCOUNTER — Ambulatory Visit (HOSPITAL_BASED_OUTPATIENT_CLINIC_OR_DEPARTMENT_OTHER): Payer: Medicaid Other | Admitting: Oncology

## 2016-06-20 VITALS — BP 126/69 | HR 92 | Temp 98.9°F | Resp 18 | Ht 69.0 in | Wt 199.4 lb

## 2016-06-20 DIAGNOSIS — D649 Anemia, unspecified: Secondary | ICD-10-CM

## 2016-06-20 DIAGNOSIS — C659 Malignant neoplasm of unspecified renal pelvis: Secondary | ICD-10-CM | POA: Diagnosis not present

## 2016-06-20 DIAGNOSIS — C7800 Secondary malignant neoplasm of unspecified lung: Secondary | ICD-10-CM

## 2016-06-20 DIAGNOSIS — C641 Malignant neoplasm of right kidney, except renal pelvis: Secondary | ICD-10-CM | POA: Diagnosis present

## 2016-06-20 DIAGNOSIS — D630 Anemia in neoplastic disease: Secondary | ICD-10-CM

## 2016-06-20 DIAGNOSIS — D6481 Anemia due to antineoplastic chemotherapy: Secondary | ICD-10-CM | POA: Diagnosis not present

## 2016-06-20 LAB — PREPARE RBC (CROSSMATCH)

## 2016-06-20 MED ORDER — HEPARIN SOD (PORK) LOCK FLUSH 100 UNIT/ML IV SOLN
500.0000 [IU] | Freq: Every day | INTRAVENOUS | Status: DC | PRN
  Filled 2016-06-20: qty 5

## 2016-06-20 MED ORDER — ACETAMINOPHEN 325 MG PO TABS
650.0000 mg | ORAL_TABLET | Freq: Once | ORAL | Status: AC
  Administered 2016-06-20: 650 mg via ORAL
  Filled 2016-06-20: qty 2

## 2016-06-20 MED ORDER — DIPHENHYDRAMINE HCL 25 MG PO CAPS
25.0000 mg | ORAL_CAPSULE | Freq: Once | ORAL | Status: AC
  Administered 2016-06-20: 25 mg via ORAL
  Filled 2016-06-20: qty 1

## 2016-06-20 MED ORDER — SODIUM CHLORIDE 0.9% FLUSH
10.0000 mL | INTRAVENOUS | Status: AC | PRN
  Administered 2016-06-20: 10 mL

## 2016-06-20 MED ORDER — CLONAZEPAM 0.5 MG PO TABS: ORAL_TABLET | ORAL | 0 refills | Status: DC

## 2016-06-20 MED ORDER — OXYCODONE HCL 5 MG PO TABS: 5.0000 mg | ORAL_TABLET | ORAL | 0 refills | Status: DC | PRN

## 2016-06-20 MED ORDER — SODIUM CHLORIDE 0.9% FLUSH: 3.0000 mL | INTRAVENOUS | Status: DC | PRN

## 2016-06-20 MED ORDER — HEPARIN SOD (PORK) LOCK FLUSH 100 UNIT/ML IV SOLN
250.0000 [IU] | INTRAVENOUS | Status: AC | PRN
  Administered 2016-06-20: 250 [IU]

## 2016-06-20 MED ORDER — SODIUM CHLORIDE 0.9 % IV SOLN
250.0000 mL | Freq: Once | INTRAVENOUS | Status: AC
  Administered 2016-06-20: 250 mL via INTRAVENOUS

## 2016-06-20 NOTE — Progress Notes (Signed)
Hematology and Oncology Follow Up Visit  David [name] [id number] 1960-09-26 56 y.o. 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 He presented with a 5 cm mass in the central left kidney as well as pulmonary metastasis, nodules and pleural effusion.   Prior Therapy:   Status post biopsy obtained on 10/12/2015 which confirmed the presence of carcinoma. 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  Chemotherapy with gemcitabine and carboplatin cycle one  on 10/30/2015. He is status post 8 cycles of therapy.   Current therapy: Under evaluation to start salvage chemotherapy.  Interim History: David [name] presents today for a follow-up visit. Since the last visit, he reports few complaints and slight health decline. He is reporting some increase in his fatigue, hip pain and exercise tolerance. It will also have lost some weight since the last visit. His appetite have decreased some and he is using Megace recently. He also reported some night sweats which are similar to the symptoms he had with his original diagnosis.  Despite these symptoms, he is reporting no major decline in his performance status. He continues to drive and attends to activities of daily living. He denied any hematuria, dysuria. He denied any hemoptysis or dyspnea on exertion.  He denied any headaches, blurry vision, syncope or seizures. He does report fevers but no chills.He does not report any chest pain, palpitation, orthopnea, leg edema or dyspnea on exertion. He denied any cough, wheezing. He does not report any nausea, vomiting or early satiety. He did report constipation which have resolved. He denied any diarrhea. He does not report any frequency, urgency or hesitancy. He does not report any skeletal complaints. Remaining review of system is unremarkable.   Medications: I have reviewed the patient's  current medications.  Current Outpatient Prescriptions  Medication Sig Dispense Refill  . carvedilol (COREG) 3.125 MG tablet take 1 tablet by mouth twice a day with meals 180 tablet 0  . chlorproMAZINE (THORAZINE) 50 MG tablet Take 1 tablet (50 mg total) by mouth 3 (three) times daily as needed for hiccoughs. 90 tablet 0  . clonazePAM (KLONOPIN) 0.5 MG tablet take 1 tablet by mouth 1 TO 2 TIMES DAILY AS NEEDED FOR ANXIETY.MAY CAUSE SEDATION 30 tablet 0  . lidocaine-prilocaine (EMLA) cream Apply 1 application topically as needed. (Patient taking differently: Apply 1 application topically as needed (for port access). ) 30 g 2  . metFORMIN (GLUCOPHAGE) 500 MG tablet Take 1 tablet (500 mg total) by mouth 2 (two) times daily with a meal. 180 tablet 1  . Multiple Vitamins-Minerals (MULTI-VITAMIN GUMMIES PO) Take 1 each by mouth daily.     . mupirocin ointment (BACTROBAN) 2 % Apply 1 application topically 3 (three) times daily. (Patient taking differently: Apply 1 application topically daily as needed (for rash). ) 30 g 1  . nystatin (MYCOSTATIN) 100000 UNIT/ML suspension Use as directed 5 mLs (500,000 Units total) in the mouth or throat 4 (four) times daily. 60 mL 0  . oxyCODONE (OXY IR/ROXICODONE) 5 MG immediate release tablet Take 1 tablet (5 mg total) by mouth every 4 (four) hours as needed for severe pain. 60 tablet 0  . tadalafil (CIALIS) 20 MG tablet Take 0.5-1 tablets (10-20 mg total) by mouth every other day as needed for erectile dysfunction. 3 tablet 0  . testosterone cypionate (DEPOTESTOSTERONE CYPIONATE) 200 MG/ML injection Inject 1 mL (200 mg total) into the muscle every 14 (  fourteen) days. 10 mL 2   Current Facility-Administered Medications  Medication Dose Route Frequency Provider Last Rate Last Dose  . testosterone cypionate (DEPOTESTOSTERONE CYPIONATE) injection 200 mg  200 mg Intramuscular Q14 Days Carlena Hurl, PA-C   200 mg at 06/19/16 1121     Allergies: No Known  Allergies  Past Medical History, Surgical history, Social history, and Family History were reviewed and updated.   Physical Exam: Blood pressure 126/69, pulse 92, temperature 98.9 F (37.2 C), temperature source Oral, resp. rate 18, height 5\' 9"  (1.753 m), weight 199 lb 6.4 oz (90.4 kg), SpO2 100 %. ECOG: 1 General appearance: A comfortable-appearing gentleman without distress. Head: Normocephalic, without obvious abnormality no oral thrush or ulcers. Neck: no adenopathy Lymph nodes: Cervical, supraclavicular, and axillary nodes normal. Heart:regular rate and rhythm, S1, S2 normal, no murmur, click, rub or gallop Lung:chest clear, no wheezing, rales, normal symmetric air entry Abdomin: soft, non-tender, without masses or organomegaly no rebound or guarding. EXT:No edema noted in his extremities.   Lab Results: Lab Results  Component Value Date   WBC 10.3 06/18/2016   HGB 8.4 (L) 06/18/2016   HCT 26.3 (L) 06/18/2016   MCV 94.9 06/18/2016   PLT 392 06/18/2016     Chemistry      Component Value Date/Time   NA 140 06/18/2016 0804   K 4.2 06/18/2016 0804   CL 100 (L) 06/10/2016 1404   CO2 23 06/18/2016 0804   BUN 14.6 06/18/2016 0804   CREATININE 0.9 06/18/2016 0804      Component Value Date/Time   CALCIUM 9.6 06/18/2016 0804   ALKPHOS 107 06/18/2016 0804   AST 9 06/18/2016 0804   ALT 13 06/18/2016 0804   BILITOT 0.40 06/18/2016 0804     EXAM: CT CHEST, ABDOMEN, AND PELVIS WITH CONTRAST  TECHNIQUE: Multidetector CT imaging of the chest, abdomen and pelvis was performed following the standard protocol during bolus administration of intravenous contrast.  CONTRAST:  <See Chart> ISOVUE-300 IOPAMIDOL (ISOVUE-300) INJECTION 61%  COMPARISON:  Multiple exams, including 03/03/2016  FINDINGS: CT CHEST FINDINGS  Cardiovascular: Right Port-A-Cath tip: Right atrium. Mild cardiomegaly.  Mediastinum/Nodes: Right anterior pericardial space lymph node 1.2 cm in  short axis on image 41/2, previously not appreciated.  Lungs/Pleura: Extensive new pleural metastatic disease on the right with pleural nodules and masses along the fissures and pleural surfaces. A conglomerate rind like mass medially along the right posterior costophrenic angle measures 2.4 cm in thickness on image 49/2. An index pleural nodule along the right mediastinal border measures 1.1 cm in thickness on image 14/2. Numerous additional scattered and confluent pleural nodules are present on the right. Small right malignant pleural effusion.  Bilateral mild mosaic attenuation in the lungs.  Musculoskeletal: Several small sclerotic rib lesions are stable.  CT ABDOMEN PELVIS FINDINGS  Hepatobiliary: Contracted gallbladder with thickened wall. Small gallstone, image 66/2. The prior tiny arterial phase enhancing lesion anteriorly in the lateral segment left hepatic lobe is not well seen on today' s portal venous phase images through the liver.  Pancreas: Unremarkable  Spleen: Unremarkable  Adrenals/Urinary Tract: Indistinct hypodense likely infiltrative mass along the left renal pelvis with some heterogeneous enhancement throughout much of the left kidney on the portal venous phase images. Difficult to measure due to the poor contrast between the mass in the rest of the enhancing kidney. The tubular contrast on the initial image series seems significantly reduced on the left compared to the right but I do not see definite renal  vein thrombosis. No hydronephrosis. Overall the distribution of abnormal enhancement in the left kidney is fairly similar to 03/03/2016. Abnormal effacement of portions of the left collecting system. Mild left perirenal stranding.  Small left kidney lower pole cyst.  Adrenal glands unremarkable.  Stomach/Bowel: Unremarkable  Vascular/Lymphatic: Just above the distal transverse duodenum on image 69/2, there is a 3.1 by 2.2 cm structure  of similar density to the surrounding non-opacified bowel but thought to probably represent an abnormally enlarged mesenteric lymph node. Adjacent mesenteric node measures 1.1 cm in short axis on image 72/2.  Reproductive: Unremarkable  Other: No supplemental non-categorized findings.  Musculoskeletal: Lumbar spondylosis and degenerative disc disease causing impingement in the neural foramina bilaterally at L5-S1.  Oval-shaped sclerotic lesion in the left iliac wing 1.7 cm in long axis, stable.  0.9 cm sclerotic lesion along the iliac side of the right SI joint on image 95/2, stable.  1.5 cm sclerotic lesion in the right sacrum on image 102/2, stable. 5 mm sclerotic lesion anteriorly in the L1 vertebral body, stable.  IMPRESSION: 1. Extensive progression of right pleural metastatic disease with numerous new nodules and masses along the pleura, and a small malignant pleural effusion. Abnormal enlarged right pericardial lymph node. 2. Similar appearance of infiltrative tumor along the left renal hilum, difficult to measure given the similar density to the surrounding renal parenchyma, with associated perirenal stranding. 3. Suspected mesenteric adenopathy just above the transverse duodenum, measuring up to 2.2 cm in long axis. 4. Stable sclerotic lesions particularly notable in the bony pelvis, probably from prior metastatic disease. 5. Impingement at L5-S1 due to spondylosis and degenerative disc disease. 6. Cholelithiasis with contracted gallbladder. 7. Mild cardiomegaly.    Impression and Plan:  56 year old gentleman with the following issues:  1. 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 He is status post biopsy of a pleural-based nodule which confirmed the presence of carcinoma although further immunohistochemical staining was not performed because of lack of tissue.  These findings confirm  the presence of advanced malignancy Arising from the renal pelvis.  He is currently receiving salvage chemotherapy with carboplatin and gemcitabine. Chemotherapy has been well tolerated with dramatic improvement in his quality of life.  CT scan on 11/09/2017showed a positive response with decrease of the volume of disease at this time.  CT scan obtained on 03/03/2016 continues to show excellent response to therapy.   He completed 8 cycles of therapy with cycle 8 of chemotherapy was given with gemcitabine only.   CT scan obtained on 06/18/2016 showed progression of disease. He is developing pulmonary based metastasis with parenchymal and pleural based nodules. He is also symptomatic from his disease and salvage therapy is required.  Risks and benefits of restarting systemic therapy was reviewed and it is critical that palliative chemotherapy is to be started. The option of therapy would include retreatment with gemcitabine and platinum, Taxotere chemotherapy or Pembrolizumab immunotherapy. It would be reasonable to try Pembrolizumab given the benefits associated with genitourinary tract malignancy. Comp patient associated with this treatment include nausea, fatigue, thyroiditis, pneumonitis and pruritus. The benefit would be to control his disease and palliate his symptoms long-term. The goal of therapy is palliative not curative at this time.   2. IV access: Port-A-Cath will be used for therapy moving forward.  3. Nausea prophylaxis: No issues reported at this time. Antiemetics available to him at this time.  4. Goals of care: These were reviewed again  with the patient and he understand he has an incurable malignancy. He still has a reasonable performance status and would like to continue with aggressive therapy.  5. Anemia: Related to malignancy and chemotherapy infusion. Hemoglobin is dropping at this time and will require repeat transfusion today.  6. Myositis with elevated stable CK.    7. Follow-up: Will be in the next 1-2 weeks to start Pembrolizumab. He will be evaluated clinically 3 weeks.  [facility], MD 5/4/20188:59 AM

## 2016-06-20 NOTE — Progress Notes (Signed)
Provider: Dahlia Byes  Diagnosis Association: Anemia, unspecified type (D64.9)  Treatment: 2 units PRBC's via Port a cath  Patient received 2 units of PRBC's via port a cath. Patient tolerated procedure well with no transfusion reaction. Discharge instructions given to patient and patient states an understanding. Patient alert, oriented, and ambulatory at time of discharge.

## 2016-06-20 NOTE — Progress Notes (Signed)
Patient on plan of care prior to pathways. 

## 2016-06-20 NOTE — Discharge Instructions (Signed)
Blood Transfusion, Adult, Care After This sheet gives you information about how to care for yourself after your procedure. Your health care provider may also give you more specific instructions. If you have problems or questions, contact your health care provider. What can I expect after the procedure? After your procedure, it is common to have:  Bruising and soreness where the IV tube was inserted.  Headache. Follow these instructions at home:  Take over-the-counter and prescription medicines only as told by your health care provider.  Return to your normal activities as told by your health care provider.  Follow instructions from your health care provider about how to take care of your IV insertion site. Make sure you:  Wash your hands with soap and water before you change your bandage (dressing). If soap and water are not available, use hand sanitizer.  Change your dressing as told by your health care provider.  Check your IV insertion site every day for signs of infection. Check for:  More redness, swelling, or pain.  More fluid or blood.  Warmth.  Pus or a bad smell. Contact a health care provider if:  You have more redness, swelling, or pain around the IV insertion site.  You have more fluid or blood coming from the IV insertion site.  Your IV insertion site feels warm to the touch.  You have pus or a bad smell coming from the IV insertion site.  Your urine turns pink, red, or brown.  You feel weak after doing your normal activities. Get help right away if:  You have signs of a serious allergic or immune system reaction, including:  Itchiness.  Hives.  Trouble breathing.  Anxiety.  Chest or lower back pain.  Fever, flushing, and chills.  Rapid pulse.  Rash.  Diarrhea.  Vomiting.  Dark urine.  Serious headache.  Dizziness.  Stiff neck.  Yellow coloration of the face or the white parts of the eyes (jaundice). This information is not  intended to replace advice given to you by your health care provider. Make sure you discuss any questions you have with your health care provider. Document Released: 02/24/2014 Document Revised: 10/03/2015 Document Reviewed: 08/20/2015 Elsevier Interactive Patient Education  2017 Elsevier Inc.  

## 2016-06-20 NOTE — Progress Notes (Signed)
START ON PATHWAY REGIMEN - Bladder     A cycle is 21 days:     Pembrolizumab   **Always confirm dose/schedule in your pharmacy ordering system**    Patient Characteristics: Metastatic Disease, Second Line AJCC M Category: Staged < 8th Ed. AJCC N Category: Staged < 8th Ed. AJCC T Category: Staged < 8th Ed. Current evidence of distant metastases? Yes AJCC 8 Stage Grouping: Staged < 8th Ed. Line of therapy: Second Line Would you be surprised if this patient died  in the next year? I would be surprised if this patient died in the next year  Intent of Therapy: Non-Curative / Palliative Intent, Discussed with Patient

## 2016-06-20 NOTE — Telephone Encounter (Signed)
Gave patient avs report and appointments for May and June. Per FS ok to start tx 5/14. Infusion at capacity the week of 5/11. Lab/fu scheduled for 5/31 due to FS out of office 6/4 and 6/5.

## 2016-06-21 LAB — BPAM RBC
BLOOD PRODUCT EXPIRATION DATE: 201805302359
Blood Product Expiration Date: 201805302359
ISSUE DATE / TIME: 201805041000
ISSUE DATE / TIME: 201805041000
UNIT TYPE AND RH: 7300
Unit Type and Rh: 7300

## 2016-06-21 LAB — TYPE AND SCREEN
ABO/RH(D): B POS
ANTIBODY SCREEN: NEGATIVE
Unit division: 0
Unit division: 0

## 2016-06-24 ENCOUNTER — Telehealth: Payer: Self-pay | Admitting: *Deleted

## 2016-06-24 MED ORDER — MEGESTROL ACETATE 625 MG/5ML PO SUSP: 625.0000 mg | Freq: Every day | ORAL | 0 refills | Status: DC

## 2016-06-24 MED ORDER — CHLORPROMAZINE HCL 50 MG PO TABS: 50.0000 mg | ORAL_TABLET | Freq: Three times a day (TID) | ORAL | 0 refills | Status: DC | PRN

## 2016-06-24 NOTE — Telephone Encounter (Signed)
Wife vernetta calling for refill on thorazine and megace. Called into [facility] [address] Patient notified.

## 2016-06-25 ENCOUNTER — Emergency Department (HOSPITAL_COMMUNITY)
Admission: EM | Admit: 2016-06-25 | Discharge: 2016-06-25 | Disposition: A | Discharge status: Home / Self Care | Payer: Medicaid Other | Attending: Emergency Medicine | Admitting: Emergency Medicine

## 2016-06-25 ENCOUNTER — Telehealth: Payer: Self-pay | Admitting: *Deleted

## 2016-06-25 ENCOUNTER — Encounter (HOSPITAL_COMMUNITY): Payer: Self-pay | Admitting: Emergency Medicine

## 2016-06-25 DIAGNOSIS — Z7984 Long term (current) use of oral hypoglycemic drugs: Secondary | ICD-10-CM | POA: Diagnosis not present

## 2016-06-25 DIAGNOSIS — I10 Essential (primary) hypertension: Secondary | ICD-10-CM | POA: Diagnosis not present

## 2016-06-25 DIAGNOSIS — Z85528 Personal history of other malignant neoplasm of kidney: Secondary | ICD-10-CM | POA: Insufficient documentation

## 2016-06-25 DIAGNOSIS — Z87891 Personal history of nicotine dependence: Secondary | ICD-10-CM | POA: Insufficient documentation

## 2016-06-25 DIAGNOSIS — R630 Anorexia: Secondary | ICD-10-CM | POA: Insufficient documentation

## 2016-06-25 DIAGNOSIS — E119 Type 2 diabetes mellitus without complications: Secondary | ICD-10-CM | POA: Diagnosis not present

## 2016-06-25 LAB — CBC WITH DIFFERENTIAL/PLATELET
BASOS PCT: 0 %
Basophils Absolute: 0 10*3/uL (ref 0.0–0.1)
EOS ABS: 0 10*3/uL (ref 0.0–0.7)
Eosinophils Relative: 0 %
HCT: 28.1 % — ABNORMAL LOW (ref 39.0–52.0)
HEMOGLOBIN: 9.3 g/dL — AB (ref 13.0–17.0)
Lymphocytes Relative: 15 %
Lymphs Abs: 2.6 10*3/uL (ref 0.7–4.0)
MCH: 30.3 pg (ref 26.0–34.0)
MCHC: 33.1 g/dL (ref 30.0–36.0)
MCV: 91.5 fL (ref 78.0–100.0)
MONOS PCT: 15 %
Monocytes Absolute: 2.7 10*3/uL — ABNORMAL HIGH (ref 0.1–1.0)
NEUTROS PCT: 70 %
Neutro Abs: 12.6 10*3/uL — ABNORMAL HIGH (ref 1.7–7.7)
Platelets: 407 10*3/uL — ABNORMAL HIGH (ref 150–400)
RBC: 3.07 MIL/uL — AB (ref 4.22–5.81)
RDW: 15.9 % — ABNORMAL HIGH (ref 11.5–15.5)
WBC: 18 10*3/uL — AB (ref 4.0–10.5)

## 2016-06-25 LAB — URINALYSIS, ROUTINE W REFLEX MICROSCOPIC
BACTERIA UA: NONE SEEN
Bilirubin Urine: NEGATIVE
Glucose, UA: NEGATIVE mg/dL
HGB URINE DIPSTICK: NEGATIVE
KETONES UR: NEGATIVE mg/dL
Nitrite: NEGATIVE
PROTEIN: 100 mg/dL — AB
Specific Gravity, Urine: 1.018 (ref 1.005–1.030)
pH: 6 (ref 5.0–8.0)

## 2016-06-25 LAB — COMPREHENSIVE METABOLIC PANEL
ALBUMIN: 2.9 g/dL — AB (ref 3.5–5.0)
ALK PHOS: 147 U/L — AB (ref 38–126)
ALT: 15 U/L — ABNORMAL LOW (ref 17–63)
ANION GAP: 9 (ref 5–15)
AST: 10 U/L — ABNORMAL LOW (ref 15–41)
BUN: 25 mg/dL — ABNORMAL HIGH (ref 6–20)
CALCIUM: 8.9 mg/dL (ref 8.9–10.3)
CO2: 22 mmol/L (ref 22–32)
Chloride: 101 mmol/L (ref 101–111)
Creatinine, Ser: 1.05 mg/dL (ref 0.61–1.24)
GFR calc Af Amer: >60 mL/min (ref >60)
GFR calc non Af Amer: >60 mL/min (ref >60)
GLUCOSE: 142 mg/dL — AB (ref 65–99)
Potassium: 4.5 mmol/L (ref 3.5–5.1)
SODIUM: 132 mmol/L — AB (ref 135–145)
Total Bilirubin: 1.2 mg/dL (ref 0.3–1.2)
Total Protein: 7.8 g/dL (ref 6.5–8.1)

## 2016-06-25 MED ORDER — HEPARIN SOD (PORK) LOCK FLUSH 100 UNIT/ML IV SOLN
500.0000 [IU] | Freq: Once | INTRAVENOUS | Status: AC
  Administered 2016-06-25: 500 [IU]
  Filled 2016-06-25: qty 5

## 2016-06-25 MED ORDER — SODIUM CHLORIDE 0.9 % IV BOLUS (SEPSIS)
1000.0000 mL | Freq: Once | INTRAVENOUS | Status: AC
  Administered 2016-06-25: 1000 mL via INTRAVENOUS

## 2016-06-25 MED ORDER — SODIUM CHLORIDE 0.9 % IV SOLN
INTRAVENOUS | Status: DC
  Administered 2016-06-25: 21:00:00 via INTRAVENOUS

## 2016-06-25 NOTE — ED Triage Notes (Signed)
Pt with hx renal cancer with mets c/o anorexia, ataxia x 2 weeks. Pt drinking PO fluids. Hiccups s/t cancer. Last chemo 9 weeks ago.

## 2016-06-25 NOTE — Telephone Encounter (Signed)
"  My husband seems to have given up with this second round of chemotherapy.  Can he be scheduled to come in now instead of next Tuesday?  He needs to begin treatment.  He's loosing weight, weak, his eyes look weak as if they could roll in the back of his head.  He's not eating well.  He ate two eggs and a piece of toast this morning but won't eat anything else the rest of the day.  He will drink water and Gatorade well.  I tried to get him to the ED last night for IVF but he refused.  He didn't know what day it was the past two days.  He thought he was in the bathroom but was peeing all over the bed.  Can't remember if he's taken his medicines so I started laying them out for him.  He's I believe the hiccup medicine has him talking out of his head but he could barely catch his breath when they were bad.  He says he's fine but his mother and I are concerned.  I'm running day errands so call me on my cell number [phone]."     Second call.   "He's at his mom's house.  He iIs driving and says he's okay, He is still weak by the eyes due to the hiccups getting to him, he doesn't look the same.  He catches breath between hiccups."  Spoke with Darrell.  Continuous hiccups with speech.  "I take thorazine but hiccups are still continuous for the past week.  I feel okay and can wait until next week.  Mom's number is [phone] if you all need to reach me."

## 2016-06-25 NOTE — Discharge Instructions (Signed)
Your oncologist will try to get urine for a sooner chemotherapy appointment.  Call his office in the morning to see about plans for follow-up care.

## 2016-06-26 ENCOUNTER — Other Ambulatory Visit: Payer: Self-pay | Admitting: Medical

## 2016-06-26 ENCOUNTER — Telehealth: Payer: Self-pay | Admitting: Medical

## 2016-06-26 ENCOUNTER — Encounter: Payer: Self-pay | Admitting: *Deleted

## 2016-06-26 ENCOUNTER — Ambulatory Visit (HOSPITAL_BASED_OUTPATIENT_CLINIC_OR_DEPARTMENT_OTHER): Payer: Medicaid Other

## 2016-06-26 VITALS — BP 128/72 | HR 89 | Temp 98.9°F | Resp 16

## 2016-06-26 DIAGNOSIS — Z5112 Encounter for antineoplastic immunotherapy: Secondary | ICD-10-CM | POA: Diagnosis present

## 2016-06-26 DIAGNOSIS — C642 Malignant neoplasm of left kidney, except renal pelvis: Secondary | ICD-10-CM | POA: Diagnosis not present

## 2016-06-26 DIAGNOSIS — C659 Malignant neoplasm of unspecified renal pelvis: Secondary | ICD-10-CM

## 2016-06-26 MED ORDER — SODIUM CHLORIDE 0.9 % IV SOLN
Freq: Once | INTRAVENOUS | Status: AC
  Administered 2016-06-26: 14:00:00 via INTRAVENOUS

## 2016-06-26 MED ORDER — DOXYCYCLINE HYCLATE 50 MG PO CAPS: 50.0000 mg | ORAL_CAPSULE | Freq: Two times a day (BID) | ORAL | 0 refills | Status: DC

## 2016-06-26 MED ORDER — PEMBROLIZUMAB CHEMO INJECTION 100 MG/4ML
200.0000 mg | Freq: Once | INTRAVENOUS | Status: AC
  Administered 2016-06-26: 200 mg via INTRAVENOUS
  Filled 2016-06-26: qty 8

## 2016-06-26 MED ORDER — SODIUM CHLORIDE 0.9% FLUSH
10.0000 mL | INTRAVENOUS | Status: DC | PRN
  Administered 2016-06-26: 10 mL
  Filled 2016-06-26: qty 10

## 2016-06-26 MED ORDER — HEPARIN SOD (PORK) LOCK FLUSH 100 UNIT/ML IV SOLN
500.0000 [IU] | Freq: Once | INTRAVENOUS | Status: AC | PRN
  Administered 2016-06-26: 500 [IU]
  Filled 2016-06-26: qty 5

## 2016-06-26 NOTE — Telephone Encounter (Signed)
Pts wife informed.

## 2016-06-26 NOTE — Patient Instructions (Signed)
[facility] Discharge Instructions for Patients Receiving Chemotherapy  Today you received the following chemotherapy agents Keytruda  To help prevent nausea and vomiting after your treatment, we encourage you to take your nausea medication as directed  If you develop nausea and vomiting that is not controlled by your nausea medication, call the clinic.   BELOW ARE SYMPTOMS THAT SHOULD BE REPORTED IMMEDIATELY:  *FEVER GREATER THAN 100.5 F  *CHILLS WITH OR WITHOUT FEVER  NAUSEA AND VOMITING THAT IS NOT CONTROLLED WITH YOUR NAUSEA MEDICATION  *UNUSUAL SHORTNESS OF BREATH  *UNUSUAL BRUISING OR BLEEDING  TENDERNESS IN MOUTH AND THROAT WITH OR WITHOUT PRESENCE OF ULCERS  *URINARY PROBLEMS  *BOWEL PROBLEMS  UNUSUAL RASH Items with * indicate a potential emergency and should be followed up as soon as possible.  Feel free to call the clinic you have any questions or concerns. The clinic phone number is [phone].  Please show the CHEMO ALERT CARD at check-in to the Emergency Department and triage nurse.  Pembrolizumab injection What is this medicine? PEMBROLIZUMAB (pem broe liz ue mab) is a monoclonal antibody. It is used to treat melanoma, head and neck cancer, Hodgkin lymphoma, non-small cell lung cancer, urothelial cancer, stomach cancer, and cancers that have a certain genetic condition. This medicine may be used for other purposes; ask your health care provider or pharmacist if you have questions. COMMON BRAND NAME(S): Keytruda What should I tell my health care provider before I take this medicine? They need to know if you have any of these conditions: -diabetes -immune system problems -inflammatory bowel disease -liver disease -lung or breathing disease -lupus -organ transplant -an unusual or allergic reaction to pembrolizumab, other medicines, foods, dyes, or preservatives -pregnant or trying to get pregnant -breast-feeding How should I use this  medicine? This medicine is for infusion into a vein. It is given by a health care professional in a hospital or clinic setting. A special MedGuide will be given to you before each treatment. Be sure to read this information carefully each time. Talk to your pediatrician regarding the use of this medicine in children. While this drug may be prescribed for selected conditions, precautions do apply. Overdosage: If you think you have taken too much of this medicine contact a poison control center or emergency room at once. NOTE: This medicine is only for you. Do not share this medicine with others. What if I miss a dose? It is important not to miss your dose. Call your doctor or health care professional if you are unable to keep an appointment. What may interact with this medicine? Interactions have not been studied. Give your health care provider a list of all the medicines, herbs, non-prescription drugs, or dietary supplements you use. Also tell them if you smoke, drink alcohol, or use illegal drugs. Some items may interact with your medicine. This list may not describe all possible interactions. Give your health care provider a list of all the medicines, herbs, non-prescription drugs, or dietary supplements you use. Also tell them if you smoke, drink alcohol, or use illegal drugs. Some items may interact with your medicine. What should I watch for while using this medicine? Your condition will be monitored carefully while you are receiving this medicine. You may need blood work done while you are taking this medicine. Do not become pregnant while taking this medicine or for 4 months after stopping it. Women should inform their doctor if they wish to become pregnant or think they might be pregnant.   There is a potential for serious side effects to an unborn child. Talk to your health care professional or pharmacist for more information. Do not breast-feed an infant while taking this medicine or for 4  months after the last dose. What side effects may I notice from receiving this medicine? Side effects that you should report to your doctor or health care professional as soon as possible: -allergic reactions like skin rash, itching or hives, swelling of the face, lips, or tongue -bloody or black, tarry -breathing problems -changes in vision -chest pain -chills -constipation -cough -dizziness or feeling faint or lightheaded -fast or irregular heartbeat -fever -flushing -hair loss -low blood counts - this medicine may decrease the number of white blood cells, red blood cells and platelets. You may be at increased risk for infections and bleeding. -muscle pain -muscle weakness -persistent headache -signs and symptoms of high blood sugar such as dizziness; dry mouth; dry skin; fruity breath; nausea; stomach pain; increased hunger or thirst; increased urination -signs and symptoms of kidney injury like trouble passing urine or change in the amount of urine -signs and symptoms of liver injury like dark urine, light-colored stools, loss of appetite, nausea, right upper belly pain, yellowing of the eyes or skin -stomach pain -sweating -weight loss Side effects that usually do not require medical attention (report to your doctor or health care professional if they continue or are bothersome): -decreased appetite -diarrhea -tiredness This list may not describe all possible side effects. Call your doctor for medical advice about side effects. You may report side effects to FDA at 1-800-FDA-1088. Where should I keep my medicine? This drug is given in a hospital or clinic and will not be stored at home. NOTE: This sheet is a summary. It may not cover all possible information. If you have questions about this medicine, talk to your doctor, pharmacist, or health care provider.  2018 Elsevier/Gold Standard (2015-11-13 12:29:36)  

## 2016-06-26 NOTE — Telephone Encounter (Signed)
antibiotic sent

## 2016-06-26 NOTE — Telephone Encounter (Signed)
Pt's wife called and stated that pt has a place on his backside that is returning. Pt would like to be put back on antibiotic, doxycycline, he was given at that time. Appt was in [place]. Pt's spouse was informed that he may need an appt. [facility] uses [facility] on [address] and can be reached at [phone].

## 2016-06-26 NOTE — ED Provider Notes (Signed)
[facility] DEPT Provider Note   CSN: [id number] Arrival date & time: 06/25/16  1724     History   Chief Complaint Chief Complaint  Patient presents with  . Anorexia    HPI David [name] is a 56 y.o. male.  He is here for worsening anorexia, hiccups, weakness and malaise, following cessation of his chemotherapy.  He had a drug holiday because of toxic effects, and has worsening of his metastatic renal cancer, on recent screening.  He denies fever, chills, cough, shortness of breath, focal weakness or dizziness.  He has persistent hiccups, for which he is taking Thorazine.  He and his family members feel like if he can get another dose of chemotherapy soon that his symptoms will improve.  There are no other known modifying factors.  HPI  Past Medical History:  Diagnosis Date  . Cancer ([place])   . Dental caries   . Diabetes mellitus, type II ([place]) 01/2012  . Hyperlipidemia   . Hypertension   . Influenza vaccine refused 01/18/2013  . Kidney cancer, primary, with metastasis from kidney to other site [facility])   . Obesity   . Pneumococcal vaccine refused 01/18/2013  . Scoliosis   . Tobacco use   . Vision impairment    left, due to prior trauma    Patient Active Problem List   Diagnosis Date Noted  . Abscess of buttock, right 03/24/2016  . Elevated CK 03/24/2016  . Myalgia 03/24/2016  . Edema 03/24/2016  . Folliculitis 09/98/3382  . Rash 03/24/2016  . Acute serous otitis media 03/17/2016  . Port catheter in place 12/11/2015  . Protein-calorie malnutrition, severe 11/15/2015  . Hyponatremia 11/14/2015  . Hypoalbuminemia due to protein-calorie malnutrition ([place]) 11/14/2015  . Generalized weakness 11/14/2015  . Mild dehydration 11/14/2015  . SIRS (systemic inflammatory response syndrome) ([facility])   . S/P thoracentesis   . Fever, unspecified 10/25/2015  . Pleural effusion on right 10/25/2015  . Pleural mass 10/25/2015  . Symptomatic anemia 10/24/2015  . 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 Abnormal CT of the chest 10/03/2015  . Screening for prostate cancer 10/03/2015  . Creatinine elevation 10/03/2015  . Pulmonary nodules 10/03/2015  . Loss of weight 10/03/2015  . Lymphadenopathy 10/03/2015  . History of colonic polyps 10/03/2015  . Leukocytosis 10/03/2015  . Anemia, unspecified 10/03/2015  . Former smoker 10/03/2015  . Type 2 diabetes mellitus with complication, without long-term current use of insulin ([place]) 01/18/2013  . Obesity, unspecified 01/18/2013    Past Surgical History:  Procedure Laterality Date  . COLONOSCOPY W/ POLYPECTOMY    . EYE SURGERY  1996   due to trauma, left eye  . IR GENERIC HISTORICAL  11/26/2015   IR US GUIDE VASC ACCESS RIGHT 11/26/2015 [facility]  . IR GENERIC HISTORICAL  11/26/2015   IR FLUORO GUIDE PORT INSERTION RIGHT 11/26/2015 [facility]       Home Medications    Prior to Admission medications   Medication Sig Start Date End Date Taking? Authorizing Provider  carvedilol (COREG) 3.125 MG tablet take 1 tablet by mouth twice a day with meals 03/31/16  Yes Tysinger, Camelia Eng, PA-C  chlorproMAZINE (THORAZINE) 50 MG tablet Take 1 tablet (50 mg total) by mouth 3 (three) times daily as needed for hiccoughs. 06/24/16  Yes Shadad, Mathis Dad, MD  clonazePAM (KLONOPIN) 0.5 MG tablet take 1 tablet by mouth 1 TO 2 TIMES DAILY AS NEEDED FOR ANXIETY.MAY CAUSE SEDATION 06/20/16  Yes Wyatt Portela, MD  lidocaine-prilocaine (EMLA) cream Apply 1 application topically as needed. Patient taking differently: Apply 1 application topically as needed (for port access).  04/15/16  Yes Wyatt Portela, MD  megestrol (MEGACE ES) 625 MG/5ML suspension Take 5 mLs (625 mg total) by mouth daily. 06/24/16  Yes Wyatt Portela, MD  metFORMIN (GLUCOPHAGE) 500 MG tablet Take 1 tablet (500 mg total) by mouth 2 (two) times daily with a meal. 01/29/16  Yes Tysinger, Camelia Eng, PA-C  Multiple Vitamins-Minerals (MULTI-VITAMIN GUMMIES PO) Take 1 each by  mouth daily.    Yes [provider]  mupirocin ointment (BACTROBAN) 2 % Apply 1 application topically 3 (three) times daily. Patient taking differently: Apply 1 application topically daily as needed (for rash).  03/24/16  Yes Tysinger, Camelia Eng, PA-C  nystatin (MYCOSTATIN) 100000 UNIT/ML suspension Use as directed 5 mLs (500,000 Units total) in the mouth or throat 4 (four) times daily. 06/10/16  Yes Leo Grosser, MD  oxyCODONE (OXY IR/ROXICODONE) 5 MG immediate release tablet Take 1 tablet (5 mg total) by mouth every 4 (four) hours as needed for severe pain. 06/20/16  Yes Wyatt Portela, MD  testosterone cypionate (DEPOTESTOSTERONE CYPIONATE) 200 MG/ML injection Inject 1 mL (200 mg total) into the muscle every 14 (fourteen) days. 06/19/16  Yes Tysinger, Camelia Eng, PA-C  tadalafil (CIALIS) 20 MG tablet Take 0.5-1 tablets (10-20 mg total) by mouth every other day as needed for erectile dysfunction. 05/29/16   Tysinger, Camelia Eng, PA-C    Family History Family History  Problem Relation Age of Onset  . Diabetes Sister   . Other Sister     gastric bypass  . Hyperlipidemia Mother   . Diabetes Mother   . Cancer Father 3    prostate cancer  . Stroke Maternal Grandmother   . Heart disease Neg Hx   . Hypertension Neg Hx   . Colon cancer Neg Hx     Social History Social History  Substance Use Topics  . Smoking status: Former Smoker    Packs/day: 0.25    Years: 20.00    Types: Cigarettes  . Smokeless tobacco: Never Used  . Alcohol use No     Allergies   Patient has no known allergies.   Review of Systems Review of Systems  All other systems reviewed and are negative.    Physical Exam Updated Vital Signs BP 125/78 (BP Location: Left Arm)   Pulse 97   Temp 98.5 F (36.9 C) (Oral)   Resp 19   Ht 5\' 9"  (1.753 m)   Wt 200 lb (90.7 kg)   SpO2 100%   BMI 29.53 kg/m   Physical Exam  Constitutional: He is oriented to person, place, and time. He appears well-developed.  Appears  older than stated age.  HENT:  Head: Normocephalic and atraumatic.  Right Ear: External ear normal.  Left Ear: External ear normal.  Eyes: Conjunctivae and EOM are normal. Pupils are equal, round, and reactive to light.  Neck: Normal range of motion and phonation normal. Neck supple.  Cardiovascular: Normal rate, regular rhythm and normal heart sounds.   Pulmonary/Chest: Effort normal and breath sounds normal. He exhibits no bony tenderness.  Abdominal: Soft. There is no tenderness.  Persistent hiccups.  Musculoskeletal: Normal range of motion.  Neurological: He is alert and oriented to person, place, and time. No cranial nerve deficit or sensory deficit. He exhibits normal muscle tone. Coordination normal.  Skin: Skin is warm, dry and intact.  Psychiatric: He has a normal  mood and affect. His behavior is normal. Judgment and thought content normal.  Nursing note and vitals reviewed.    ED Treatments / Results  Labs (all labs ordered are listed, but only abnormal results are displayed) Labs Reviewed  CBC WITH DIFFERENTIAL/PLATELET - Abnormal; Notable for the following:       Result Value   WBC 18.0 (*)    RBC 3.07 (*)    Hemoglobin 9.3 (*)    HCT 28.1 (*)    RDW 15.9 (*)    Platelets 407 (*)    Neutro Abs 12.6 (*)    Monocytes Absolute 2.7 (*)    All other components within normal limits  COMPREHENSIVE METABOLIC PANEL - Abnormal; Notable for the following:    Sodium 132 (*)    Glucose, Bld 142 (*)    BUN 25 (*)    Albumin 2.9 (*)    AST 10 (*)    ALT 15 (*)    Alkaline Phosphatase 147 (*)    All other components within normal limits  URINALYSIS, ROUTINE W REFLEX MICROSCOPIC - Abnormal; Notable for the following:    Protein, ur 100 (*)    Leukocytes, UA TRACE (*)    Squamous Epithelial / LPF 0-5 (*)    All other components within normal limits    EKG  EKG Interpretation None       Radiology No results found.  Procedures Procedures (including critical care  time)  Medications Ordered in ED Medications  0.9 %  sodium chloride infusion ( Intravenous Stopped 06/25/16 2338)  sodium chloride 0.9 % bolus 1,000 mL (0 mLs Intravenous Stopped 06/25/16 2111)  sodium chloride 0.9 % bolus 1,000 mL (0 mLs Intravenous Stopped 06/25/16 2338)  heparin lock flush 100 unit/mL (500 Units Intracatheter Given 06/25/16 2348)     Initial Impression / Assessment and Plan / ED Course  I have reviewed the triage vital signs and the nursing notes.  Pertinent labs & imaging results that were available during my care of the patient were reviewed by me and considered in my medical decision making (see chart for details).     Medications  0.9 %  sodium chloride infusion ( Intravenous Stopped 06/25/16 2338)  sodium chloride 0.9 % bolus 1,000 mL (0 mLs Intravenous Stopped 06/25/16 2111)  sodium chloride 0.9 % bolus 1,000 mL (0 mLs Intravenous Stopped 06/25/16 2338)  heparin lock flush 100 unit/mL (500 Units Intracatheter Given 06/25/16 2348)    Patient Vitals for the past 24 hrs:  BP Temp Temp src Pulse Resp SpO2 Height Weight  06/25/16 2213 125/78 - - 97 19 100 % - -  06/25/16 2009 114/66 - - 85 18 97 % - -  06/25/16 1727 136/90 98.5 F (36.9 C) Oral 95 19 98 % 5\' 9"  (1.753 m) 200 lb (90.7 kg)   Case discussed with his oncologist, who will see about advancing his chemotherapy appointment.   At discharge- reevaluation with update and discussion. After initial assessment and treatment, an updated evaluation reveals he is feeling better at this time after 2 L saline bolus.  Findings discussed with patient and family member, all questions answered. Mystique Bjelland L    Final Clinical Impressions(s) / ED Diagnoses   Final diagnoses:  Anorexia   Nonspecific symptoms with renal cancer, metastatic.  Doubt serious bacterial infection, metabolic instability, or impending vascular collapse.  Nursing Notes Reviewed/ Care Coordinated Applicable Imaging Reviewed Interpretation of  Laboratory Data incorporated into ED treatment  The patient appears reasonably screened  and/or stabilized for discharge and I doubt any other medical condition or other [facility] requiring further screening, evaluation, or treatment in the ED at this time prior to discharge.  Plan: Home Medications-continue usual; Home Treatments-rest, fluids; return here if the recommended treatment, does not improve the symptoms; Recommended follow up-PCP as needed, oncology as soon as possible   New Prescriptions Discharge Medication List as of 06/25/2016 11:41 PM       Daleen Bo, MD 06/26/16 0005

## 2016-06-26 NOTE — Telephone Encounter (Signed)
"  I took David [name] to the ED last night.  He was given two liters IVF.  We were instructed to call oncologist for F/U.  Have not heard yet if he can receive treatment before next week.  Are they able to schedule him for treatment?  He may improve if he can receive treatment."  Noted the Infusion room appointment totals are high through Tuesday.  Call transferred [phone].

## 2016-06-27 ENCOUNTER — Other Ambulatory Visit: Payer: Self-pay | Admitting: *Deleted

## 2016-06-27 ENCOUNTER — Telehealth: Payer: Self-pay | Admitting: *Deleted

## 2016-06-27 MED ORDER — METOCLOPRAMIDE HCL 10 MG PO TABS: 10.0000 mg | ORAL_TABLET | Freq: Four times a day (QID) | ORAL | 0 refills | Status: DC | PRN

## 2016-06-27 NOTE — Telephone Encounter (Signed)
Chemo follow up call after 1st keytruda. Patient states he still has hiccups, thorazine not helping. Drinking and trying to eat, no appetite.per dr Alen Blew reglan 10 mg ordered for q 6 hours prn hiccups.

## 2016-06-27 NOTE — Telephone Encounter (Signed)
Chemo follow up call for first [place]. Patient's voice mail box full and cannot accept any messages

## 2016-06-27 NOTE — Telephone Encounter (Signed)
-----   Message from Sinda Du, RN sent at 06/26/2016  3:12 PM EDT ----- Regarding: Dr. Alen Blew - 1st chemo f/u New regimen - 1st Keytruda

## 2016-06-30 ENCOUNTER — Other Ambulatory Visit: Payer: Medicaid Other

## 2016-06-30 ENCOUNTER — Ambulatory Visit: Payer: Medicaid Other

## 2016-06-30 ENCOUNTER — Emergency Department (HOSPITAL_COMMUNITY)
Admission: EM | Admit: 2016-06-30 | Discharge: 2016-07-01 | Disposition: A | Discharge status: Home / Self Care | Payer: Medicaid Other | Attending: Emergency Medicine | Admitting: Emergency Medicine

## 2016-06-30 ENCOUNTER — Telehealth: Payer: Self-pay

## 2016-06-30 ENCOUNTER — Encounter (HOSPITAL_COMMUNITY): Payer: Self-pay | Admitting: Emergency Medicine

## 2016-06-30 DIAGNOSIS — Z794 Long term (current) use of insulin: Secondary | ICD-10-CM | POA: Diagnosis not present

## 2016-06-30 DIAGNOSIS — I10 Essential (primary) hypertension: Secondary | ICD-10-CM | POA: Insufficient documentation

## 2016-06-30 DIAGNOSIS — R5383 Other fatigue: Secondary | ICD-10-CM | POA: Insufficient documentation

## 2016-06-30 DIAGNOSIS — E119 Type 2 diabetes mellitus without complications: Secondary | ICD-10-CM | POA: Diagnosis not present

## 2016-06-30 DIAGNOSIS — E860 Dehydration: Secondary | ICD-10-CM | POA: Diagnosis not present

## 2016-06-30 DIAGNOSIS — R531 Weakness: Secondary | ICD-10-CM | POA: Diagnosis present

## 2016-06-30 DIAGNOSIS — Z87891 Personal history of nicotine dependence: Secondary | ICD-10-CM | POA: Diagnosis not present

## 2016-06-30 DIAGNOSIS — R530 Neoplastic (malignant) related fatigue: Secondary | ICD-10-CM

## 2016-06-30 LAB — CBC
HEMATOCRIT: 24.8 % — AB (ref 39.0–52.0)
HEMOGLOBIN: 8.1 g/dL — AB (ref 13.0–17.0)
MCH: 29.5 pg (ref 26.0–34.0)
MCHC: 32.7 g/dL (ref 30.0–36.0)
MCV: 90.2 fL (ref 78.0–100.0)
Platelets: 419 10*3/uL — ABNORMAL HIGH (ref 150–400)
RBC: 2.75 MIL/uL — AB (ref 4.22–5.81)
RDW: 16.2 % — ABNORMAL HIGH (ref 11.5–15.5)
WBC: 17.9 10*3/uL — ABNORMAL HIGH (ref 4.0–10.5)

## 2016-06-30 LAB — URINALYSIS, ROUTINE W REFLEX MICROSCOPIC
Bacteria, UA: NONE SEEN
Bilirubin Urine: NEGATIVE
GLUCOSE, UA: NEGATIVE mg/dL
HGB URINE DIPSTICK: NEGATIVE
KETONES UR: NEGATIVE mg/dL
NITRITE: NEGATIVE
PROTEIN: 30 mg/dL — AB
Specific Gravity, Urine: 1.012 (ref 1.005–1.030)
pH: 6 (ref 5.0–8.0)

## 2016-06-30 LAB — BASIC METABOLIC PANEL
ANION GAP: 11 (ref 5–15)
BUN: 21 mg/dL — ABNORMAL HIGH (ref 6–20)
CHLORIDE: 100 mmol/L — AB (ref 101–111)
CO2: 21 mmol/L — ABNORMAL LOW (ref 22–32)
Calcium: 8.8 mg/dL — ABNORMAL LOW (ref 8.9–10.3)
Creatinine, Ser: 1.07 mg/dL (ref 0.61–1.24)
GFR calc non Af Amer: >60 mL/min (ref >60)
Glucose, Bld: 197 mg/dL — ABNORMAL HIGH (ref 65–99)
POTASSIUM: 4.6 mmol/L (ref 3.5–5.1)
SODIUM: 132 mmol/L — AB (ref 135–145)

## 2016-06-30 LAB — HEPATIC FUNCTION PANEL
ALT: 14 U/L — ABNORMAL LOW (ref 17–63)
AST: 10 U/L — AB (ref 15–41)
Albumin: 2.4 g/dL — ABNORMAL LOW (ref 3.5–5.0)
Alkaline Phosphatase: 149 U/L — ABNORMAL HIGH (ref 38–126)
BILIRUBIN DIRECT: 0.5 mg/dL (ref 0.1–0.5)
BILIRUBIN INDIRECT: 0.6 mg/dL (ref 0.3–0.9)
BILIRUBIN TOTAL: 1.1 mg/dL (ref 0.3–1.2)
Total Protein: 7.5 g/dL (ref 6.5–8.1)

## 2016-06-30 LAB — CK: Total CK: 12 U/L — ABNORMAL LOW (ref 49–397)

## 2016-06-30 MED ORDER — CHLORPROMAZINE HCL 25 MG PO TABS
50.0000 mg | ORAL_TABLET | Freq: Three times a day (TID) | ORAL | Status: DC | PRN
  Administered 2016-06-30: 50 mg via ORAL
  Filled 2016-06-30: qty 2

## 2016-06-30 MED ORDER — SODIUM CHLORIDE 0.9 % IV BOLUS (SEPSIS)
2000.0000 mL | Freq: Once | INTRAVENOUS | Status: AC
  Administered 2016-06-30: 2000 mL via INTRAVENOUS

## 2016-06-30 NOTE — ED Triage Notes (Signed)
Patient wife states patient been really weak over past couple weeks and was here last week for IV fluids. Patient wife called patient's cancer MD and was told to come to ED for IV fluids due to patient not eating and weakness.  Patient had treatment on Friday.

## 2016-06-30 NOTE — Telephone Encounter (Signed)
Thorazine taking 2 TID, he wants to take 3 TID. It takes 3 to get rid of the hiccups. He gets so he cannot catch his breath between the hiccups. When he takes the 3 he sleeps but when he gets up he acts like he is high. He loses his balance and unsteady on his feet.  He wanted to go to hospital yesterday because of the hiccups. Wife told him there was nothing the ER could do for the hiccups.  He is weak from the treatment and the cancer. Keytruda D1C1 06/26/16  What to do?

## 2016-06-30 NOTE — Telephone Encounter (Signed)
s/w wife re Dr Hazeline Junker response. Wife stated "I'll get him to the ED"

## 2016-06-30 NOTE — Telephone Encounter (Signed)
I advise to go to the ED. He might need to be evaluated for altered mental status and possibly hospitalized for dehydration.

## 2016-06-30 NOTE — ED Provider Notes (Signed)
[facility] DEPT Provider Note   CSN: [id number] Arrival date & time: 06/30/16  1848  By signing my name below, I, Margit Banda, attest that this documentation has been prepared under the direction and in the presence of [employer], PA-C. Electronically Signed: Margit Banda, ED Scribe. 06/30/16. 10:47 PM.   History   Chief Complaint Chief Complaint  Patient presents with  . Weakness  . not eating    HPI Favor Vastine is a 56 y.o. 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 Pt also reports loss of appetite and dehydration. He has lost ~ 10 pounds in a month. Patient also c/o hiccups which are constant ever since he switched chemotherapy regimens. Pt takes Thorazine 50 mg for hiccups with moderate relief. He recently started immunotherapy on Friday, 06/27/16. Pt denies fever, vomiting, increased myalgia, CP, and SOB.  The history is provided by the patient, the spouse and a relative. No language interpreter was used.    Past Medical History:  Diagnosis Date  . Cancer ([place])   . Dental caries   . Diabetes mellitus, type II ([place]) 01/2012  . Hyperlipidemia   . Hypertension   . Influenza vaccine refused 01/18/2013  . Kidney cancer, primary, with metastasis from kidney to other site [facility])   . Obesity   . Pneumococcal vaccine refused 01/18/2013  . Scoliosis   . Tobacco use   . Vision impairment    left, due to prior trauma    Patient Active Problem List   Diagnosis Date Noted  . Abscess of buttock, right 03/24/2016  . Elevated CK 03/24/2016  . Myalgia 03/24/2016  . Edema 03/24/2016  . Folliculitis 27/07/2374  . Rash 03/24/2016  . Acute serous otitis media 03/17/2016  . Port catheter in place 12/11/2015  . Protein-calorie malnutrition, severe 11/15/2015  . Hyponatremia 11/14/2015  . Hypoalbuminemia due to protein-calorie malnutrition  ([place]) 11/14/2015  . Generalized weakness 11/14/2015  . Mild dehydration 11/14/2015  . SIRS (systemic inflammatory response syndrome) ([facility])   . S/P thoracentesis   . Fever, unspecified 10/25/2015  . Pleural effusion on right 10/25/2015  . Pleural mass 10/25/2015  . Symptomatic anemia 10/24/2015  . Primary cancer of renal pelvis ([place]) 10/18/2015  . Abnormal CT of the chest 10/03/2015  . Screening for prostate cancer 10/03/2015  . Creatinine elevation 10/03/2015  . Pulmonary nodules 10/03/2015  . Loss of weight 10/03/2015  . Lymphadenopathy 10/03/2015  . History of colonic polyps 10/03/2015  . Leukocytosis 10/03/2015  . Anemia, unspecified 10/03/2015  . Former smoker 10/03/2015  . Type 2 diabetes mellitus with complication, without long-term current use of insulin ([place]) 01/18/2013  . Obesity, unspecified 01/18/2013    Past Surgical History:  Procedure Laterality Date  . COLONOSCOPY W/ POLYPECTOMY    . EYE SURGERY  1996   due to trauma, left eye  . IR GENERIC HISTORICAL  11/26/2015   IR US GUIDE VASC ACCESS RIGHT 11/26/2015 [facility]  . IR GENERIC HISTORICAL  11/26/2015   IR FLUORO GUIDE PORT INSERTION RIGHT 11/26/2015 [facility]       Home Medications    Prior to Admission medications   Medication Sig Start Date End Date Taking? Authorizing Provider  carvedilol (COREG) 3.125 MG tablet take 1 tablet by mouth twice a day with meals 03/31/16  Yes Tysinger, Camelia Eng, PA-C  chlorproMAZINE (THORAZINE) 50 MG tablet Take 1 tablet (50  mg total) by mouth 3 (three) times daily as needed for hiccoughs. 06/24/16  Yes Shadad, Mathis Dad, MD  clonazePAM (KLONOPIN) 0.5 MG tablet take 1 tablet by mouth 1 TO 2 TIMES DAILY AS NEEDED FOR ANXIETY.MAY CAUSE SEDATION 06/20/16  Yes Wyatt Portela, MD  doxycycline (VIBRAMYCIN) 50 MG capsule Take 1 capsule (50 mg total) by mouth 2 (two) times daily. 06/26/16  Yes Tysinger, Camelia Eng, PA-C  lidocaine-prilocaine (EMLA) cream Apply 1 application topically as  needed. Patient taking differently: Apply 1 application topically as needed (for port access).  04/15/16  Yes Wyatt Portela, MD  megestrol (MEGACE ES) 625 MG/5ML suspension Take 5 mLs (625 mg total) by mouth daily. 06/24/16  Yes Wyatt Portela, MD  metFORMIN (GLUCOPHAGE) 500 MG tablet Take 1 tablet (500 mg total) by mouth 2 (two) times daily with a meal. 01/29/16  Yes Tysinger, Camelia Eng, PA-C  metoCLOPramide (REGLAN) 10 MG tablet Take 1 tablet (10 mg total) by mouth every 6 (six) hours as needed for nausea. 06/27/16  Yes Wyatt Portela, MD  Multiple Vitamins-Minerals (MULTI-VITAMIN GUMMIES PO) Take 1 each by mouth daily.    Yes [provider]  mupirocin ointment (BACTROBAN) 2 % Apply 1 application topically 3 (three) times daily. Patient taking differently: Apply 1 application topically daily as needed (for rash).  03/24/16  Yes Tysinger, Camelia Eng, PA-C  nystatin (MYCOSTATIN) 100000 UNIT/ML suspension Use as directed 5 mLs (500,000 Units total) in the mouth or throat 4 (four) times daily. 06/10/16  Yes Leo Grosser, MD  oxyCODONE (OXY IR/ROXICODONE) 5 MG immediate release tablet Take 1 tablet (5 mg total) by mouth every 4 (four) hours as needed for severe pain. 06/20/16  Yes Wyatt Portela, MD  tadalafil (CIALIS) 20 MG tablet Take 0.5-1 tablets (10-20 mg total) by mouth every other day as needed for erectile dysfunction. 05/29/16  Yes Tysinger, Camelia Eng, PA-C  testosterone cypionate (DEPOTESTOSTERONE CYPIONATE) 200 MG/ML injection Inject 1 mL (200 mg total) into the muscle every 14 (fourteen) days. 06/19/16  Yes Tysinger, Camelia Eng, PA-C    Family History Family History  Problem Relation Age of Onset  . Diabetes Sister   . Other Sister        gastric bypass  . Hyperlipidemia Mother   . Diabetes Mother   . Cancer Father 69       prostate cancer  . Stroke Maternal Grandmother   . Heart disease Neg Hx   . Hypertension Neg Hx   . Colon cancer Neg Hx     Social History Social History    Substance Use Topics  . Smoking status: Former Smoker    Packs/day: 0.25    Years: 20.00    Types: Cigarettes  . Smokeless tobacco: Never Used  . Alcohol use No     Allergies   Patient has no known allergies.   Review of Systems Review of Systems A complete 10 system review of systems was obtained and all systems are negative except as noted in the HPI and PMH.    Physical Exam Updated Vital Signs BP 107/63   Pulse 93   Temp 99.2 F (37.3 C) (Oral)   Resp 19   SpO2 99%   Physical Exam  Constitutional: He is oriented to person, place, and time. He appears well-developed and well-nourished. No distress.  Fatigued appearing, nontoxic.  HENT:  Head: Normocephalic and atraumatic.  Eyes: Conjunctivae and EOM are normal. No scleral icterus.  Neck: Normal range of  motion.  Cardiovascular: Normal rate, regular rhythm and intact distal pulses.   Pulmonary/Chest: Effort normal. No respiratory distress. He has no wheezes. He has no rales.  Respirations even and unlabored  Abdominal: Soft. He exhibits no distension. There is no tenderness. There is no guarding.  Soft, nontender abdomen.  Musculoskeletal: Normal range of motion.  Neurological: He is alert and oriented to person, place, and time. He exhibits normal muscle tone. Coordination normal.  GCS 15. Patient moving all extremities.  Skin: Skin is warm and dry. No rash noted. He is not diaphoretic. No erythema. No pallor.  Psychiatric: He has a normal mood and affect. His behavior is normal.  Nursing note and vitals reviewed.    ED Treatments / Results  DIAGNOSTIC STUDIES: Oxygen Saturation is 100% on RA, normal by my interpretation.   COORDINATION OF CARE: 10:46 PM-Discussed next steps with pt. Pt verbalized understanding and is agreeable with the plan.    Labs (all labs ordered are listed, but only abnormal results are displayed) Labs Reviewed  BASIC METABOLIC PANEL - Abnormal; Notable for the following:        Result Value   Sodium 132 (*)    Chloride 100 (*)    CO2 21 (*)    Glucose, Bld 197 (*)    BUN 21 (*)    Calcium 8.8 (*)    All other components within normal limits  CBC - Abnormal; Notable for the following:    WBC 17.9 (*)    RBC 2.75 (*)    Hemoglobin 8.1 (*)    HCT 24.8 (*)    RDW 16.2 (*)    Platelets 419 (*)    All other components within normal limits  URINALYSIS, ROUTINE W REFLEX MICROSCOPIC - Abnormal; Notable for the following:    Protein, ur 30 (*)    Leukocytes, UA TRACE (*)    Squamous Epithelial / LPF 0-5 (*)    All other components within normal limits  CK - Abnormal; Notable for the following:    Total CK 12 (*)    All other components within normal limits  HEPATIC FUNCTION PANEL - Abnormal; Notable for the following:    Albumin 2.4 (*)    AST 10 (*)    ALT 14 (*)    Alkaline Phosphatase 149 (*)    All other components within normal limits    EKG  EKG Interpretation None       Radiology No results found.  Procedures Procedures (including critical care time)  Medications Ordered in ED Medications  sodium chloride 0.9 % bolus 2,000 mL (0 mLs Intravenous Stopped 07/01/16 0044)  heparin lock flush 100 unit/mL (500 Units Intracatheter Given 07/01/16 0045)     Initial Impression / Assessment and Plan / ED Course  I have reviewed the triage vital signs and the nursing notes.  Pertinent labs & imaging results that were available during my care of the patient were reviewed by me and considered in my medical decision making (see chart for details).     56 year old male presents to the emergency department for persistent generalized weakness and fatigue. He reports lack of appetite with a 10 pound weight loss in the past month. He attributes his worsening weakness to the treatments for his metastatic cancer. Recent imaging did show further progression of his cancer with pulmonary metastasis.  Patient has no complaints of chest pain, shortness of  breath, or abdominal pain. He denies recent fevers. He was urged to come to the  emergency department over concern for profound dehydration, though laboratory workup today is at baseline. Patient has had improvement in his fatigue with IV fluids. He was also given a dose of his daily Thorazine for chronic hiccups.  Since arrival, patient has been able to tolerate POs without difficulty. His vitals have remained stable. He states that he is feeling well enough for discharge and outpatient follow-up with his oncologist, Dr. Alen Blew. Return precautions discussed and provided. Patient discharged in stable condition with no unaddressed concerns.   Vitals:   07/01/16 0000 07/01/16 0015 07/01/16 0016 07/01/16 0030  BP: 102/66  102/66 107/63  Pulse: 82 88 87 93  Resp: (!) 28 (!) 26 (!) 23 19  Temp:      TempSrc:      SpO2: 93% 99% 93% 99%    Final Clinical Impressions(s) / ED Diagnoses   Final diagnoses:  Neoplastic malignant related fatigue  Dehydration, mild    New Prescriptions Discharge Medication List as of 07/01/2016 12:42 AM     I personally performed the services described in this documentation, which was scribed in my presence. The recorded information has been reviewed and is accurate.       Antonietta Breach, PA-C 07/01/16 2761    Tegeler, Gwenyth Allegra, MD 07/01/16 [phone]

## 2016-07-01 ENCOUNTER — Ambulatory Visit (HOSPITAL_BASED_OUTPATIENT_CLINIC_OR_DEPARTMENT_OTHER): Payer: Medicaid Other | Admitting: Oncology

## 2016-07-01 ENCOUNTER — Ambulatory Visit (HOSPITAL_COMMUNITY)
Admission: RE | Admit: 2016-07-01 | Discharge: 2016-07-01 | Disposition: A | Discharge status: Home / Self Care | Payer: Medicaid Other | Source: Ambulatory Visit | Attending: Oncology | Admitting: Oncology

## 2016-07-01 ENCOUNTER — Telehealth: Payer: Self-pay | Admitting: *Deleted

## 2016-07-01 ENCOUNTER — Other Ambulatory Visit: Payer: Self-pay | Admitting: *Deleted

## 2016-07-01 ENCOUNTER — Ambulatory Visit: Payer: Medicaid Other

## 2016-07-01 ENCOUNTER — Encounter: Payer: Self-pay | Admitting: *Deleted

## 2016-07-01 ENCOUNTER — Telehealth: Payer: Self-pay | Admitting: Oncology

## 2016-07-01 VITALS — BP 126/73 | HR 122 | Temp 99.1°F | Resp 19 | Ht 69.0 in | Wt 191.5 lb

## 2016-07-01 DIAGNOSIS — C659 Malignant neoplasm of unspecified renal pelvis: Secondary | ICD-10-CM | POA: Insufficient documentation

## 2016-07-01 DIAGNOSIS — D649 Anemia, unspecified: Secondary | ICD-10-CM

## 2016-07-01 DIAGNOSIS — D630 Anemia in neoplastic disease: Secondary | ICD-10-CM

## 2016-07-01 DIAGNOSIS — C642 Malignant neoplasm of left kidney, except renal pelvis: Secondary | ICD-10-CM | POA: Diagnosis present

## 2016-07-01 DIAGNOSIS — C7800 Secondary malignant neoplasm of unspecified lung: Secondary | ICD-10-CM | POA: Diagnosis not present

## 2016-07-01 LAB — CBC WITH DIFFERENTIAL/PLATELET
BASO%: 0.1 % (ref 0.0–2.0)
Basophils Absolute: 0 10*3/uL (ref 0.0–0.1)
EOS%: 0.1 % (ref 0.0–7.0)
Eosinophils Absolute: 0 10*3/uL (ref 0.0–0.5)
HCT: 27.3 % — ABNORMAL LOW (ref 38.4–49.9)
HEMOGLOBIN: 8.7 g/dL — AB (ref 13.0–17.1)
LYMPH%: 13.6 % — ABNORMAL LOW (ref 14.0–49.0)
MCH: 29.4 pg (ref 27.2–33.4)
MCHC: 31.9 g/dL — ABNORMAL LOW (ref 32.0–36.0)
MCV: 92.2 fL (ref 79.3–98.0)
MONO#: 1.8 10*3/uL — ABNORMAL HIGH (ref 0.1–0.9)
MONO%: 12.5 % (ref 0.0–14.0)
NEUT%: 73.7 % (ref 39.0–75.0)
NEUTROS ABS: 10.4 10*3/uL — AB (ref 1.5–6.5)
Platelets: 435 10*3/uL — ABNORMAL HIGH (ref 140–400)
RBC: 2.96 10*6/uL — ABNORMAL LOW (ref 4.20–5.82)
RDW: 15.9 % — AB (ref 11.0–14.6)
WBC: 14.1 10*3/uL — ABNORMAL HIGH (ref 4.0–10.3)
lymph#: 1.9 10*3/uL (ref 0.9–3.3)

## 2016-07-01 LAB — COMPREHENSIVE METABOLIC PANEL
ALBUMIN: 2.2 g/dL — AB (ref 3.5–5.0)
ALK PHOS: 166 U/L — AB (ref 40–150)
ALT: 14 U/L (ref 0–55)
ANION GAP: 12 meq/L — AB (ref 3–11)
AST: 8 U/L (ref 5–34)
BILIRUBIN TOTAL: 1.3 mg/dL — AB (ref 0.20–1.20)
BUN: 19 mg/dL (ref 7.0–26.0)
CALCIUM: 10 mg/dL (ref 8.4–10.4)
CO2: 20 meq/L — AB (ref 22–29)
Chloride: 103 mEq/L (ref 98–109)
Creatinine: 1.2 mg/dL (ref 0.7–1.3)
EGFR: 81 mL/min/{1.73_m2} — ABNORMAL LOW (ref >90)
Glucose: 225 mg/dl — ABNORMAL HIGH (ref 70–140)
Potassium: 4.8 mEq/L (ref 3.5–5.1)
Sodium: 135 mEq/L — ABNORMAL LOW (ref 136–145)
TOTAL PROTEIN: 7.8 g/dL (ref 6.4–8.3)

## 2016-07-01 LAB — TECHNOLOGIST REVIEW

## 2016-07-01 MED ORDER — HEPARIN SOD (PORK) LOCK FLUSH 100 UNIT/ML IV SOLN
500.0000 [IU] | Freq: Once | INTRAVENOUS | Status: AC
  Administered 2016-07-01: 500 [IU]
  Filled 2016-07-01: qty 5

## 2016-07-01 NOTE — Telephone Encounter (Signed)
Gave patient AVS and calender per 5/15 los. Let patient know per Ambrose Pancoast that Transfusion is friday

## 2016-07-01 NOTE — Progress Notes (Signed)
Hematology and Oncology Follow Up Visit  David [name] [id number] 04-Apr-1960 56 y.o. 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 He presented with a 5 cm mass in the central left kidney as well as pulmonary metastasis, nodules and pleural effusion.   Prior Therapy:   Status post biopsy obtained on 10/12/2015 which confirmed the presence of carcinoma. 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  Chemotherapy with gemcitabine and carboplatin cycle one  on 10/30/2015. He is status post 8 cycles of therapy.   Current therapy: Pembrolizumab 200 mg every 3 weeks started on 06/26/2016.  Interim History: David [name] presents today for a follow-up visit. Since the last visit, he received the first cycle of Pembrolizumab without major complications. For a few days afterwards, he continues his decline in his health including increased fatigue, tiredness and continuous hiccups. These symptoms caused him to go to the emergency department on multiple occasions although did not require hospitalizations.  Today, he reports his hiccups are improved. He has not taken any Thorazine today. He reports no altered mental status or confusion. He is ambulating without any help of a walker or cane. His appetite remains poor and overall fairly fatigued. He denied any hematochezia or melena.  He denied any headaches, blurry vision, syncope or seizures. He does report fevers but no chills.He does not report any chest pain, palpitation, orthopnea, leg edema or dyspnea on exertion. He denied any cough, wheezing. He does not report any nausea, vomiting or early satiety. He did report constipation which have resolved. He denied any diarrhea. He does not report any frequency, urgency or hesitancy. He does not report any skeletal complaints. Remaining review of system is unremarkable.   Medications: I  have reviewed the patient's current medications.  Current Outpatient Prescriptions  Medication Sig Dispense Refill  . carvedilol (COREG) 3.125 MG tablet take 1 tablet by mouth twice a day with meals 180 tablet 0  . chlorproMAZINE (THORAZINE) 50 MG tablet Take 1 tablet (50 mg total) by mouth 3 (three) times daily as needed for hiccoughs. 90 tablet 0  . clonazePAM (KLONOPIN) 0.5 MG tablet take 1 tablet by mouth 1 TO 2 TIMES DAILY AS NEEDED FOR ANXIETY.MAY CAUSE SEDATION 30 tablet 0  . doxycycline (VIBRAMYCIN) 50 MG capsule Take 1 capsule (50 mg total) by mouth 2 (two) times daily. 20 capsule 0  . lidocaine-prilocaine (EMLA) cream Apply 1 application topically as needed. (Patient taking differently: Apply 1 application topically as needed (for port access). ) 30 g 2  . megestrol (MEGACE ES) 625 MG/5ML suspension Take 5 mLs (625 mg total) by mouth daily. 150 mL 0  . metFORMIN (GLUCOPHAGE) 500 MG tablet Take 1 tablet (500 mg total) by mouth 2 (two) times daily with a meal. 180 tablet 1  . metoCLOPramide (REGLAN) 10 MG tablet Take 1 tablet (10 mg total) by mouth every 6 (six) hours as needed for nausea. 60 tablet 0  . Multiple Vitamins-Minerals (MULTI-VITAMIN GUMMIES PO) Take 1 each by mouth daily.     . mupirocin ointment (BACTROBAN) 2 % Apply 1 application topically 3 (three) times daily. (Patient taking differently: Apply 1 application topically daily as needed (for rash). ) 30 g 1  . nystatin (MYCOSTATIN) 100000 UNIT/ML suspension Use as directed 5 mLs (500,000 Units total) in the mouth or throat 4 (four) times daily. 60 mL 0  . oxyCODONE (OXY IR/ROXICODONE) 5 MG immediate release  tablet Take 1 tablet (5 mg total) by mouth every 4 (four) hours as needed for severe pain. 60 tablet 0  . tadalafil (CIALIS) 20 MG tablet Take 0.5-1 tablets (10-20 mg total) by mouth every other day as needed for erectile dysfunction. 3 tablet 0  . testosterone cypionate (DEPOTESTOSTERONE CYPIONATE) 200 MG/ML injection Inject  1 mL (200 mg total) into the muscle every 14 (fourteen) days. 10 mL 2   Current Facility-Administered Medications  Medication Dose Route Frequency Provider Last Rate Last Dose  . testosterone cypionate (DEPOTESTOSTERONE CYPIONATE) injection 200 mg  200 mg Intramuscular Q14 Days Carlena Hurl, PA-C   200 mg at 06/19/16 1121     Allergies: No Known Allergies  Past Medical History, Surgical history, Social history, and Family History were reviewed and updated.   Physical Exam: Blood pressure 126/73, pulse (!) 122, temperature 99.1 F (37.3 C), temperature source Oral, resp. rate 19, height 5\' 9"  (1.753 m), weight 191 lb 8 oz (86.9 kg), SpO2 100 %. ECOG: 1 General appearance: Chronically ill-appearing gentleman appeared without distress. Head: Normocephalic, without obvious abnormality no oral ulcers or lesions. Neck: no adenopathy Lymph nodes: Cervical, supraclavicular, and axillary nodes normal. Heart:regular rate and rhythm, S1, S2 normal, no murmur, click, rub or gallop Lung:chest clear, no wheezing, rales, normal symmetric air entry Abdomin: soft, non-tender, without masses or organomegaly no shifting dullness or ascites. EXT:No edema noted in his extremities.   Lab Results: Lab Results  Component Value Date   WBC 17.9 (H) 06/30/2016   HGB 8.1 (L) 06/30/2016   HCT 24.8 (L) 06/30/2016   MCV 90.2 06/30/2016   PLT 419 (H) 06/30/2016     Chemistry      Component Value Date/Time   NA 132 (L) 06/30/2016 2138   NA 140 06/18/2016 0804   K 4.6 06/30/2016 2138   K 4.2 06/18/2016 0804   CL 100 (L) 06/30/2016 2138   CO2 21 (L) 06/30/2016 2138   CO2 23 06/18/2016 0804   BUN 21 (H) 06/30/2016 2138   BUN 14.6 06/18/2016 0804   CREATININE 1.07 06/30/2016 2138   CREATININE 0.9 06/18/2016 0804      Component Value Date/Time   CALCIUM 8.8 (L) 06/30/2016 2138   CALCIUM 9.6 06/18/2016 0804   ALKPHOS 149 (H) 06/30/2016 2138   ALKPHOS 107 06/18/2016 0804   AST 10 (L) 06/30/2016  2138   AST 9 06/18/2016 0804   ALT 14 (L) 06/30/2016 2138   ALT 13 06/18/2016 0804   BILITOT 1.1 06/30/2016 2138   BILITOT 0.40 06/18/2016 0804      Impression and Plan:  56 year old gentleman with the following issues:  1. 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 He is status post biopsy of a pleural-based nodule which confirmed the presence of carcinoma although further immunohistochemical staining was not performed because of lack of tissue.  These findings confirm the presence of advanced malignancy Arising from the renal pelvis.  He is currently receiving salvage chemotherapy with carboplatin and gemcitabine. Chemotherapy has been well tolerated with dramatic improvement in his quality of life.  CT scan on 11/09/2017showed a positive response with decrease of the volume of disease at this time.  CT scan obtained on 03/03/2016 continues to show excellent response to therapy.   He completed 8 cycles of therapy with cycle 8 of chemotherapy was given with gemcitabine only.   CT scan obtained on 06/18/2016 showed progression of disease. He  is developing pulmonary based metastasis with parenchymal and pleural based nodules. He is also symptomatic from his disease and salvage therapy is required.  He is currently on Pembrolizumab and have tolerated therapy without major complications. Risks and benefits of continuing this therapy versus switching to  systemic chemotherapy was discussed again today. He is concerned that he is not getting the same benefit he was getting with chemotherapy previously. It is certainly too early to judge the success of immunotherapy at this setting but I will have a low threshold for switching him back to chemotherapy at any time.  I also offered him a referral to [facility] or any other facility if he would like that I feel offer very little at this point. The goal of treatment is  palliative and his options are limited in any case.   2. IV access: Port-A-Cath will be used for therapy moving forward.  3. Nausea prophylaxis: No issues reported at this time. Antiemetics available to him at this time.  4. Goals of care: These were reviewed again with the patient and he understand he has an incurable malignancy. Despite our best efforts eventually this cancer removal take over his body and will require hospice.  5. Anemia: Related to malignancy and chemotherapy infusion. despite recent transfusion his hemoglobin is still about 8 and we will transfuse him again soon  6. Follow-up: Weekly to check his blood counts in 2 weeks to receive cycle 2 of Pembrolizumab.  Zola Button, MD 5/15/201812:39 PM

## 2016-07-01 NOTE — Telephone Encounter (Signed)
Wife of patient called stating that she would like to notify MD [facility] that he was in the ED last night. Patient does not feel any better from last night. Wife is requesting if she can receive a note from MD [facility] so that she can have the remainder of the week off from work to attend to patient. Wife also is requesting a second opinion from [facility].

## 2016-07-01 NOTE — ED Notes (Signed)
Pt ambulatory and independent at discharge.  Verbalized understanding of discharge instructions 

## 2016-07-01 NOTE — Telephone Encounter (Signed)
Please let him know that I would like to se him today to evaluate him and answer any questions.

## 2016-07-04 ENCOUNTER — Ambulatory Visit (HOSPITAL_COMMUNITY)
Admission: RE | Admit: 2016-07-04 | Discharge: 2016-07-04 | Disposition: A | Discharge status: Home / Self Care | Payer: Medicaid Other | Source: Ambulatory Visit | Attending: Oncology | Admitting: Oncology

## 2016-07-04 DIAGNOSIS — C659 Malignant neoplasm of unspecified renal pelvis: Secondary | ICD-10-CM

## 2016-07-04 LAB — PREPARE RBC (CROSSMATCH)

## 2016-07-04 MED ORDER — ACETAMINOPHEN 325 MG PO TABS
650.0000 mg | ORAL_TABLET | Freq: Once | ORAL | Status: AC
  Administered 2016-07-04: 650 mg via ORAL
  Filled 2016-07-04: qty 2

## 2016-07-04 MED ORDER — SODIUM CHLORIDE 0.9% FLUSH
10.0000 mL | INTRAVENOUS | Status: AC | PRN
  Administered 2016-07-04: 10 mL

## 2016-07-04 MED ORDER — SODIUM CHLORIDE 0.9 % IV SOLN
250.0000 mL | Freq: Once | INTRAVENOUS | Status: AC
  Administered 2016-07-04: 250 mL via INTRAVENOUS

## 2016-07-04 MED ORDER — SODIUM CHLORIDE 0.9 % IV SOLN: 250.0000 mL | Freq: Once | INTRAVENOUS | Status: AC

## 2016-07-04 MED ORDER — DIPHENHYDRAMINE HCL 25 MG PO CAPS
25.0000 mg | ORAL_CAPSULE | Freq: Once | ORAL | Status: AC
  Administered 2016-07-04: 25 mg via ORAL
  Filled 2016-07-04: qty 1

## 2016-07-04 MED ORDER — HEPARIN SOD (PORK) LOCK FLUSH 100 UNIT/ML IV SOLN
500.0000 [IU] | Freq: Every day | INTRAVENOUS | Status: AC | PRN
Start: 2016-07-04 — End: 2016-07-04
  Administered 2016-07-04: 500 [IU]
  Filled 2016-07-04: qty 5

## 2016-07-04 NOTE — Discharge Instructions (Signed)

## 2016-07-04 NOTE — Procedures (Signed)
[facility]  Procedure Note  Micahel Omlor TKZ:[id number] DOB: Mar 16, 1960 DOA: 07/04/2016   Ordering Provider: Dahlia Byes MD  Associated Diagnosis: Primary malignant neoplasm of renal pelvis, unspecified laterality ([place]) (C65.9)  Procedure Note:  Transfusion of 2 units PRBCs   Condition During Procedure: Pt tolerated well; no complications noted   Condition at Discharge: Pt alert, oriented, ambulatory; accompanied by family   Nigel Sloop, [facility]

## 2016-07-05 LAB — TYPE AND SCREEN
ABO/RH(D): B POS
Antibody Screen: NEGATIVE
Unit division: 0
Unit division: 0

## 2016-07-05 LAB — BPAM RBC
BLOOD PRODUCT EXPIRATION DATE: 201806132359
BLOOD PRODUCT EXPIRATION DATE: 201806132359
ISSUE DATE / TIME: 201805180933
ISSUE DATE / TIME: 201805180933
Unit Type and Rh: 7300
Unit Type and Rh: 7300

## 2016-07-07 ENCOUNTER — Telehealth: Payer: Self-pay | Admitting: *Deleted

## 2016-07-07 NOTE — Telephone Encounter (Signed)
Ok to prepare this letter.

## 2016-07-07 NOTE — Telephone Encounter (Signed)
"  I need a note for work.  My employer needs the note to include my name "Tayshun Gappa" to accept it.  I can pick this up on 07-09-2016 when he's there for next appointments.  He is still weak and needs someone with him.  I will be out of work until Thursday when my granddaughter will stay with him Thursday and Friday.  I do not want to take FMLA again unless Mitsugi absolutely needs me to.  Return number if any questions is [phone]."

## 2016-07-08 ENCOUNTER — Other Ambulatory Visit (INDEPENDENT_AMBULATORY_CARE_PROVIDER_SITE_OTHER): Payer: Medicaid Other

## 2016-07-08 ENCOUNTER — Encounter: Payer: Self-pay | Admitting: *Deleted

## 2016-07-08 DIAGNOSIS — E291 Testicular hypofunction: Secondary | ICD-10-CM | POA: Diagnosis not present

## 2016-07-08 MED ORDER — TESTOSTERONE CYPIONATE 200 MG/ML IM SOLN
200.0000 mg | Freq: Once | INTRAMUSCULAR | Status: AC
  Administered 2016-07-08: 200 mg via INTRAMUSCULAR

## 2016-07-09 ENCOUNTER — Telehealth: Payer: Self-pay | Admitting: Oncology

## 2016-07-09 ENCOUNTER — Other Ambulatory Visit (HOSPITAL_BASED_OUTPATIENT_CLINIC_OR_DEPARTMENT_OTHER): Payer: Medicaid Other

## 2016-07-09 ENCOUNTER — Telehealth: Payer: Self-pay | Admitting: Nutrition

## 2016-07-09 ENCOUNTER — Ambulatory Visit (HOSPITAL_BASED_OUTPATIENT_CLINIC_OR_DEPARTMENT_OTHER): Payer: Medicaid Other | Admitting: Oncology

## 2016-07-09 DIAGNOSIS — C7800 Secondary malignant neoplasm of unspecified lung: Secondary | ICD-10-CM | POA: Diagnosis not present

## 2016-07-09 DIAGNOSIS — C642 Malignant neoplasm of left kidney, except renal pelvis: Secondary | ICD-10-CM

## 2016-07-09 DIAGNOSIS — D630 Anemia in neoplastic disease: Secondary | ICD-10-CM

## 2016-07-09 DIAGNOSIS — C659 Malignant neoplasm of unspecified renal pelvis: Secondary | ICD-10-CM

## 2016-07-09 DIAGNOSIS — D649 Anemia, unspecified: Secondary | ICD-10-CM

## 2016-07-09 LAB — CBC WITH DIFFERENTIAL/PLATELET
BASO%: 0.1 % (ref 0.0–2.0)
Basophils Absolute: 0 10*3/uL (ref 0.0–0.1)
EOS%: 0.1 % (ref 0.0–7.0)
Eosinophils Absolute: 0 10*3/uL (ref 0.0–0.5)
HEMATOCRIT: 31 % — AB (ref 38.4–49.9)
HGB: 9.8 g/dL — ABNORMAL LOW (ref 13.0–17.1)
LYMPH#: 2 10*3/uL (ref 0.9–3.3)
LYMPH%: 11.7 % — ABNORMAL LOW (ref 14.0–49.0)
MCH: 29.1 pg (ref 27.2–33.4)
MCHC: 31.6 g/dL — ABNORMAL LOW (ref 32.0–36.0)
MCV: 92 fL (ref 79.3–98.0)
MONO#: 2.2 10*3/uL — AB (ref 0.1–0.9)
MONO%: 12.8 % (ref 0.0–14.0)
NEUT%: 75.3 % — ABNORMAL HIGH (ref 39.0–75.0)
NEUTROS ABS: 13.1 10*3/uL — AB (ref 1.5–6.5)
NRBC: 0 % (ref 0–0)
Platelets: 319 10*3/uL (ref 140–400)
RBC: 3.37 10*6/uL — ABNORMAL LOW (ref 4.20–5.82)
RDW: 15.8 % — AB (ref 11.0–14.6)
WBC: 17.4 10*3/uL — ABNORMAL HIGH (ref 4.0–10.3)

## 2016-07-09 LAB — COMPREHENSIVE METABOLIC PANEL
ALBUMIN: 2.3 g/dL — AB (ref 3.5–5.0)
ALK PHOS: 156 U/L — AB (ref 40–150)
ALT: 11 U/L (ref 0–55)
AST: 10 U/L (ref 5–34)
Anion Gap: 11 mEq/L (ref 3–11)
BUN: 27.6 mg/dL — AB (ref 7.0–26.0)
CALCIUM: 10.7 mg/dL — AB (ref 8.4–10.4)
CO2: 21 mEq/L — ABNORMAL LOW (ref 22–29)
CREATININE: 1.3 mg/dL (ref 0.7–1.3)
Chloride: 102 mEq/L (ref 98–109)
EGFR: 71 mL/min/{1.73_m2} — ABNORMAL LOW (ref >90)
GLUCOSE: 227 mg/dL — AB (ref 70–140)
Potassium: 5.3 mEq/L — ABNORMAL HIGH (ref 3.5–5.1)
SODIUM: 133 meq/L — AB (ref 136–145)
Total Bilirubin: 1.31 mg/dL — ABNORMAL HIGH (ref 0.20–1.20)
Total Protein: 8.4 g/dL — ABNORMAL HIGH (ref 6.4–8.3)

## 2016-07-09 LAB — TECHNOLOGIST REVIEW

## 2016-07-09 MED ORDER — OXYCODONE HCL 5 MG PO TABS
5.0000 mg | ORAL_TABLET | ORAL | 0 refills | Status: DC | PRN
Start: 2016-07-09 — End: 2016-07-29

## 2016-07-09 MED ORDER — MORPHINE SULFATE ER 30 MG PO TBCR: 30.0000 mg | EXTENDED_RELEASE_TABLET | Freq: Two times a day (BID) | ORAL | 0 refills | Status: DC

## 2016-07-09 MED ORDER — CHLORPROMAZINE HCL 50 MG PO TABS: 50.0000 mg | ORAL_TABLET | Freq: Three times a day (TID) | ORAL | 0 refills | Status: DC | PRN

## 2016-07-09 MED ORDER — CLONAZEPAM 0.5 MG PO TABS: ORAL_TABLET | ORAL | 0 refills | Status: DC

## 2016-07-09 NOTE — Telephone Encounter (Signed)
Scheduled appts per 5/23 los - Gave patient AVS and calender.

## 2016-07-09 NOTE — Progress Notes (Signed)
Hematology and Oncology Follow Up Visit  David [name] [id number] 17-Jul-1960 56 y.o. 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 He presented with a 5 cm mass in the central left kidney as well as pulmonary metastasis, nodules and pleural effusion.   Prior Therapy:   Status post biopsy obtained on 10/12/2015 which confirmed the presence of carcinoma. 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  Chemotherapy with gemcitabine and carboplatin cycle one  on 10/30/2015. He is status post 8 cycles of therapy.   Current therapy: Pembrolizumab 200 mg every 3 weeks started on 06/26/2016. Cycle 2 scheduled for 07/17/2016.  Interim History: David [name] presents today for a follow-up visit. Since the last visit, he reports doing about the same without any major changes. He is still quite debilitated with symptoms of fatigue and tiredness. He did receive packed red cell transfusion which have helped his performance status. He denied any dyspnea on exertion.   He continues to have issues with her cups although Thorazine has been working reasonably well at this time and controlling the symptoms. He continues to have pain that has been generalized in nature. He has been taking oxycodone every 4-6 hours at 10 mg apiece. He feels that is an adequate in controlling his pain. He moves his bowels regularly without constipation.   He denied any headaches, blurry vision, syncope or seizures. He does report fevers but no chills.He does not report any chest pain, palpitation, orthopnea, leg edema or dyspnea on exertion. He denied any cough, wheezing. He does not report any nausea, vomiting or early satiety. He did report constipation which have resolved. He denied any diarrhea. He does not report any frequency, urgency or hesitancy. He does not report any skeletal complaints. Remaining review of  system is unremarkable.   Medications: I have reviewed the patient's current medications.  Current Outpatient Prescriptions  Medication Sig Dispense Refill  . carvedilol (COREG) 3.125 MG tablet take 1 tablet by mouth twice a day with meals 180 tablet 0  . chlorproMAZINE (THORAZINE) 50 MG tablet Take 1 tablet (50 mg total) by mouth 3 (three) times daily as needed for hiccoughs. 90 tablet 0  . chlorproMAZINE (THORAZINE) 50 MG tablet Take 1 tablet (50 mg total) by mouth 3 (three) times daily as needed for hiccoughs. 90 tablet 0  . clonazePAM (KLONOPIN) 0.5 MG tablet take 1 tablet by mouth 1 TO 2 TIMES DAILY AS NEEDED FOR ANXIETY.MAY CAUSE SEDATION 30 tablet 0  . doxycycline (VIBRAMYCIN) 50 MG capsule Take 1 capsule (50 mg total) by mouth 2 (two) times daily. 20 capsule 0  . lidocaine-prilocaine (EMLA) cream Apply 1 application topically as needed. (Patient taking differently: Apply 1 application topically as needed (for port access). ) 30 g 2  . megestrol (MEGACE ES) 625 MG/5ML suspension Take 5 mLs (625 mg total) by mouth daily. 150 mL 0  . metFORMIN (GLUCOPHAGE) 500 MG tablet Take 1 tablet (500 mg total) by mouth 2 (two) times daily with a meal. 180 tablet 1  . metoCLOPramide (REGLAN) 10 MG tablet Take 1 tablet (10 mg total) by mouth every 6 (six) hours as needed for nausea. 60 tablet 0  . Multiple Vitamins-Minerals (MULTI-VITAMIN GUMMIES PO) Take 1 each by mouth daily.     . mupirocin ointment (BACTROBAN) 2 % Apply 1 application topically 3 (three) times daily. (Patient taking differently: Apply 1 application topically daily as needed (for  rash). ) 30 g 1  . nystatin (MYCOSTATIN) 100000 UNIT/ML suspension Use as directed 5 mLs (500,000 Units total) in the mouth or throat 4 (four) times daily. 60 mL 0  . oxyCODONE (OXY IR/ROXICODONE) 5 MG immediate release tablet Take 1 tablet (5 mg total) by mouth every 4 (four) hours as needed for severe pain. 60 tablet 0  . tadalafil (CIALIS) 20 MG tablet Take  0.5-1 tablets (10-20 mg total) by mouth every other day as needed for erectile dysfunction. 3 tablet 0  . testosterone cypionate (DEPOTESTOSTERONE CYPIONATE) 200 MG/ML injection Inject 1 mL (200 mg total) into the muscle every 14 (fourteen) days. 10 mL 2  . morphine (MS CONTIN) 30 MG 12 hr tablet Take 1 tablet (30 mg total) by mouth every 12 (twelve) hours. 60 tablet 0   No current facility-administered medications for this visit.      Allergies: No Known Allergies  Past Medical History, Surgical history, Social history, and Family History were reviewed and updated.   Physical Exam: Blood pressure 118/66, pulse 97, temperature 97.6 F (36.4 C), temperature source Oral, resp. rate 18, height 5\' 9"  (1.753 m), weight 187 lb 1.6 oz (84.9 kg), SpO2 100 %. ECOG: 1 General appearance: Ill-appearing gentleman appeared comfortable.  Head: Normocephalic, without obvious abnormality no oral thrush or ulcers.  Neck: no adenopathy Lymph nodes: Cervical, supraclavicular, and axillary nodes normal. Heart:regular rate and rhythm, S1, S2 normal, no murmur, click, rub or gallop Lung:chest clear, no wheezing, rales, normal symmetric air entry Abdomin: soft, non-tender, without masses or organomegaly no rebound or guarding.  EXT:No edema noted in his extremities.   Lab Results: Lab Results  Component Value Date   WBC 17.4 (H) 07/09/2016   HGB 9.8 (L) 07/09/2016   HCT 31.0 (L) 07/09/2016   MCV 92.0 07/09/2016   PLT 319 07/09/2016     Chemistry      Component Value Date/Time   NA 133 (L) 07/09/2016 0946   K 5.3 (H) 07/09/2016 0946   CL 100 (L) 06/30/2016 2138   CO2 21 (L) 07/09/2016 0946   BUN 27.6 (H) 07/09/2016 0946   CREATININE 1.3 07/09/2016 0946      Component Value Date/Time   CALCIUM 10.7 (H) 07/09/2016 0946   ALKPHOS 156 (H) 07/09/2016 0946   AST 10 07/09/2016 0946   ALT 11 07/09/2016 0946   BILITOT 1.31 (H) 07/09/2016 0946      Impression and Plan:  56 year old gentleman  with the following issues:  1. 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 He is status post biopsy of a pleural-based nodule which confirmed the presence of carcinoma although further immunohistochemical staining was not performed because of lack of tissue.  These findings confirm the presence of advanced malignancy Arising from the renal pelvis.  He is currently receiving salvage chemotherapy with carboplatin and gemcitabine. Chemotherapy has been well tolerated with dramatic improvement in his quality of life.  CT scan on 11/09/2017showed a positive response with decrease of the volume of disease at this time.  CT scan obtained on 03/03/2016 continues to show excellent response to therapy.   He completed 8 cycles of therapy with cycle 8 of chemotherapy was given with gemcitabine only.   CT scan obtained on 06/18/2016 showed progression of disease. He is developing pulmonary based metastasis with parenchymal and pleural based nodules. He is also symptomatic from his disease and salvage therapy is required.  He is currently  on Pembrolizumab and have tolerated therapy without major complications.   The plan is to proceed with cycle 2 next week without any dose reduction or delay. Different salvage therapy will be used if he feels that shows any clinical benefit in the immediate future.   2. IV access: Port-A-Cath will be used for therapy moving forward.  3. Nausea prophylaxis: No issues reported at this time. Antiemetics available to him at this time.  4. Anemia: Related to malignancy and chemotherapy infusion. Hemoglobin is adequate today without any transfusion.  5. Hypercalcemia: Related to malignancy and we'll arrange for Zometa infusion with his next treatment.  6. Goals of care: These were reviewed again with the patient and he understand he has an incurable malignancy. Despite our best efforts eventually this  cancer removal take over his body and will require hospice.  6. Follow-up: Weekly to check his blood counts in 1 weeks to receive cycle 2 of Pembrolizumab.  [facility], MD 5/23/201810:50 AM

## 2016-07-09 NOTE — Telephone Encounter (Signed)
Contacted patient who was interested in receiving samples of oral nutrition supplements. I left one complementary case of Ensure Plus and a few bottles of strawberry flavored ensure enlive. Patient will pick up on Tuesday, May 29, in the Patient and [facility].

## 2016-07-13 ENCOUNTER — Encounter (HOSPITAL_COMMUNITY): Payer: Self-pay | Admitting: Emergency Medicine

## 2016-07-13 ENCOUNTER — Observation Stay (HOSPITAL_COMMUNITY): Payer: Medicaid Other

## 2016-07-13 ENCOUNTER — Inpatient Hospital Stay (HOSPITAL_COMMUNITY)
Admission: EM | Admit: 2016-07-13 | Discharge: 2016-07-15 | DRG: 687 | Disposition: A | Discharge status: Home / Self Care | Payer: Medicaid Other | Attending: Internal Medicine | Admitting: Internal Medicine

## 2016-07-13 DIAGNOSIS — D638 Anemia in other chronic diseases classified elsewhere: Secondary | ICD-10-CM | POA: Diagnosis present

## 2016-07-13 DIAGNOSIS — I119 Hypertensive heart disease without heart failure: Secondary | ICD-10-CM | POA: Diagnosis present

## 2016-07-13 DIAGNOSIS — K5900 Constipation, unspecified: Secondary | ICD-10-CM | POA: Diagnosis present

## 2016-07-13 DIAGNOSIS — E669 Obesity, unspecified: Secondary | ICD-10-CM | POA: Diagnosis present

## 2016-07-13 DIAGNOSIS — R05 Cough: Secondary | ICD-10-CM

## 2016-07-13 DIAGNOSIS — G8929 Other chronic pain: Secondary | ICD-10-CM | POA: Diagnosis present

## 2016-07-13 DIAGNOSIS — R531 Weakness: Secondary | ICD-10-CM

## 2016-07-13 DIAGNOSIS — N390 Urinary tract infection, site not specified: Secondary | ICD-10-CM | POA: Diagnosis present

## 2016-07-13 DIAGNOSIS — R059 Cough, unspecified: Secondary | ICD-10-CM

## 2016-07-13 DIAGNOSIS — R066 Hiccough: Secondary | ICD-10-CM | POA: Diagnosis present

## 2016-07-13 DIAGNOSIS — D6481 Anemia due to antineoplastic chemotherapy: Secondary | ICD-10-CM | POA: Diagnosis present

## 2016-07-13 DIAGNOSIS — M419 Scoliosis, unspecified: Secondary | ICD-10-CM | POA: Diagnosis present

## 2016-07-13 DIAGNOSIS — E875 Hyperkalemia: Secondary | ICD-10-CM | POA: Diagnosis present

## 2016-07-13 DIAGNOSIS — C7800 Secondary malignant neoplasm of unspecified lung: Secondary | ICD-10-CM | POA: Diagnosis present

## 2016-07-13 DIAGNOSIS — C659 Malignant neoplasm of unspecified renal pelvis: Secondary | ICD-10-CM | POA: Diagnosis present

## 2016-07-13 DIAGNOSIS — D649 Anemia, unspecified: Secondary | ICD-10-CM | POA: Diagnosis present

## 2016-07-13 DIAGNOSIS — E119 Type 2 diabetes mellitus without complications: Secondary | ICD-10-CM | POA: Diagnosis present

## 2016-07-13 DIAGNOSIS — C642 Malignant neoplasm of left kidney, except renal pelvis: Principal | ICD-10-CM | POA: Diagnosis present

## 2016-07-13 DIAGNOSIS — Z79899 Other long term (current) drug therapy: Secondary | ICD-10-CM

## 2016-07-13 DIAGNOSIS — Z87891 Personal history of nicotine dependence: Secondary | ICD-10-CM

## 2016-07-13 DIAGNOSIS — Z79891 Long term (current) use of opiate analgesic: Secondary | ICD-10-CM

## 2016-07-13 DIAGNOSIS — T451X5A Adverse effect of antineoplastic and immunosuppressive drugs, initial encounter: Secondary | ICD-10-CM | POA: Diagnosis present

## 2016-07-13 DIAGNOSIS — E785 Hyperlipidemia, unspecified: Secondary | ICD-10-CM | POA: Diagnosis present

## 2016-07-13 LAB — URINALYSIS, ROUTINE W REFLEX MICROSCOPIC
Bilirubin Urine: NEGATIVE
Glucose, UA: 50 mg/dL — AB
HGB URINE DIPSTICK: NEGATIVE
KETONES UR: NEGATIVE mg/dL
NITRITE: NEGATIVE
PH: 6 (ref 5.0–8.0)
Protein, ur: 30 mg/dL — AB
Specific Gravity, Urine: 1.013 (ref 1.005–1.030)

## 2016-07-13 LAB — CBC WITH DIFFERENTIAL/PLATELET
Basophils Absolute: 0 10*3/uL (ref 0.0–0.1)
Basophils Relative: 0 %
EOS ABS: 0 10*3/uL (ref 0.0–0.7)
Eosinophils Relative: 0 %
HEMATOCRIT: 24.3 % — AB (ref 39.0–52.0)
Hemoglobin: 7.7 g/dL — ABNORMAL LOW (ref 13.0–17.0)
LYMPHS ABS: 2.5 10*3/uL (ref 0.7–4.0)
LYMPHS PCT: 13 %
MCH: 28.7 pg (ref 26.0–34.0)
MCHC: 31.7 g/dL (ref 30.0–36.0)
MCV: 90.7 fL (ref 78.0–100.0)
Monocytes Absolute: 2.4 10*3/uL — ABNORMAL HIGH (ref 0.1–1.0)
Monocytes Relative: 13 %
NEUTROS ABS: 14 10*3/uL — AB (ref 1.7–7.7)
NEUTROS PCT: 74 %
Platelets: 326 10*3/uL (ref 150–400)
RBC: 2.68 MIL/uL — AB (ref 4.22–5.81)
RDW: 16 % — ABNORMAL HIGH (ref 11.5–15.5)
WBC: 19 10*3/uL — AB (ref 4.0–10.5)

## 2016-07-13 LAB — I-STAT CHEM 8, ED
BUN: 21 mg/dL — AB (ref 6–20)
CALCIUM ION: 1.11 mmol/L — AB (ref 1.15–1.40)
Chloride: 99 mmol/L — ABNORMAL LOW (ref 101–111)
Creatinine, Ser: 0.9 mg/dL (ref 0.61–1.24)
Glucose, Bld: 223 mg/dL — ABNORMAL HIGH (ref 65–99)
HEMATOCRIT: 23 % — AB (ref 39.0–52.0)
HEMOGLOBIN: 7.8 g/dL — AB (ref 13.0–17.0)
Potassium: 5 mmol/L (ref 3.5–5.1)
SODIUM: 132 mmol/L — AB (ref 135–145)
TCO2: 24 mmol/L (ref 0–100)

## 2016-07-13 LAB — BASIC METABOLIC PANEL
Anion gap: 7 (ref 5–15)
BUN: 22 mg/dL — AB (ref 6–20)
CHLORIDE: 101 mmol/L (ref 101–111)
CO2: 24 mmol/L (ref 22–32)
Calcium: 8.6 mg/dL — ABNORMAL LOW (ref 8.9–10.3)
Creatinine, Ser: 0.95 mg/dL (ref 0.61–1.24)
GFR calc Af Amer: >60 mL/min (ref >60)
GFR calc non Af Amer: >60 mL/min (ref >60)
Glucose, Bld: 219 mg/dL — ABNORMAL HIGH (ref 65–99)
POTASSIUM: 5.1 mmol/L (ref 3.5–5.1)
SODIUM: 132 mmol/L — AB (ref 135–145)

## 2016-07-13 LAB — PREPARE RBC (CROSSMATCH)

## 2016-07-13 LAB — RETICULOCYTES
RBC.: 2.66 MIL/uL — ABNORMAL LOW (ref 4.22–5.81)
Retic Count, Absolute: 50.5 10*3/uL (ref 19.0–186.0)
Retic Ct Pct: 1.9 % (ref 0.4–3.1)

## 2016-07-13 LAB — HEMOGLOBIN AND HEMATOCRIT, BLOOD
HCT: 26.7 % — ABNORMAL LOW (ref 39.0–52.0)
Hemoglobin: 8.6 g/dL — ABNORMAL LOW (ref 13.0–17.0)

## 2016-07-13 LAB — GLUCOSE, CAPILLARY
GLUCOSE-CAPILLARY: 167 mg/dL — AB (ref 65–99)
GLUCOSE-CAPILLARY: 225 mg/dL — AB (ref 65–99)

## 2016-07-13 LAB — POC OCCULT BLOOD, ED: FECAL OCCULT BLD: NEGATIVE

## 2016-07-13 MED ORDER — NYSTATIN 100000 UNIT/ML MT SUSP
5.0000 mL | Freq: Four times a day (QID) | OROMUCOSAL | Status: DC
  Administered 2016-07-13 – 2016-07-15 (×7): 500000 [IU] via OROMUCOSAL
  Filled 2016-07-13 (×7): qty 5

## 2016-07-13 MED ORDER — LIDOCAINE-PRILOCAINE 2.5-2.5 % EX CREA: 1.0000 "application " | TOPICAL_CREAM | CUTANEOUS | Status: DC | PRN

## 2016-07-13 MED ORDER — CARVEDILOL 3.125 MG PO TABS
3.1250 mg | ORAL_TABLET | Freq: Two times a day (BID) | ORAL | Status: DC
  Administered 2016-07-13 – 2016-07-15 (×4): 3.125 mg via ORAL
  Filled 2016-07-13 (×4): qty 1

## 2016-07-13 MED ORDER — PANTOPRAZOLE SODIUM 40 MG PO TBEC
40.0000 mg | DELAYED_RELEASE_TABLET | Freq: Every day | ORAL | Status: DC
  Administered 2016-07-13 – 2016-07-15 (×3): 40 mg via ORAL
  Filled 2016-07-13 (×3): qty 1

## 2016-07-13 MED ORDER — CHLORPROMAZINE HCL 50 MG PO TABS
50.0000 mg | ORAL_TABLET | Freq: Three times a day (TID) | ORAL | Status: DC | PRN
  Administered 2016-07-14 – 2016-07-15 (×3): 50 mg via ORAL
  Filled 2016-07-13 (×3): qty 1

## 2016-07-13 MED ORDER — TESTOSTERONE CYPIONATE 200 MG/ML IM SOLN: 200.0000 mg | INTRAMUSCULAR | Status: DC

## 2016-07-13 MED ORDER — HEPARIN SOD (PORK) LOCK FLUSH 100 UNIT/ML IV SOLN
500.0000 [IU] | Freq: Once | INTRAVENOUS | Status: DC
  Filled 2016-07-13: qty 5

## 2016-07-13 MED ORDER — MEGESTROL ACETATE 400 MG/10ML PO SUSP
800.0000 mg | Freq: Every day | ORAL | Status: DC
  Administered 2016-07-13 – 2016-07-15 (×3): 800 mg via ORAL
  Filled 2016-07-13 (×3): qty 20

## 2016-07-13 MED ORDER — DIPHENHYDRAMINE HCL 50 MG/ML IJ SOLN: 25.0000 mg | Freq: Four times a day (QID) | INTRAMUSCULAR | Status: DC | PRN

## 2016-07-13 MED ORDER — TADALAFIL 20 MG PO TABS
10.0000 mg | ORAL_TABLET | ORAL | Status: DC | PRN
Start: 2016-07-13 — End: 2016-07-13

## 2016-07-13 MED ORDER — CLONAZEPAM 0.5 MG PO TABS
0.5000 mg | ORAL_TABLET | Freq: Two times a day (BID) | ORAL | Status: DC | PRN
  Administered 2016-07-13 – 2016-07-14 (×2): 0.5 mg via ORAL
  Filled 2016-07-13 (×2): qty 1

## 2016-07-13 MED ORDER — LEVOFLOXACIN 750 MG PO TABS
750.0000 mg | ORAL_TABLET | Freq: Every day | ORAL | Status: DC
  Administered 2016-07-13: 750 mg via ORAL
  Filled 2016-07-13: qty 1

## 2016-07-13 MED ORDER — ALBUTEROL SULFATE (2.5 MG/3ML) 0.083% IN NEBU: 2.5000 mg | INHALATION_SOLUTION | RESPIRATORY_TRACT | Status: DC | PRN

## 2016-07-13 MED ORDER — SODIUM CHLORIDE 0.9 % IV SOLN
Freq: Once | INTRAVENOUS | Status: AC
  Administered 2016-07-13: 18:00:00 via INTRAVENOUS

## 2016-07-13 MED ORDER — MEGESTROL ACETATE 625 MG/5ML PO SUSP: 625.0000 mg | Freq: Every day | ORAL | Status: DC

## 2016-07-13 MED ORDER — OXYCODONE HCL 5 MG PO TABS
5.0000 mg | ORAL_TABLET | Freq: Four times a day (QID) | ORAL | Status: DC | PRN
  Administered 2016-07-13 – 2016-07-14 (×2): 5 mg via ORAL
  Filled 2016-07-13 (×2): qty 1

## 2016-07-13 MED ORDER — ACETAMINOPHEN 325 MG PO TABS: 650.0000 mg | ORAL_TABLET | Freq: Four times a day (QID) | ORAL | Status: DC | PRN

## 2016-07-13 MED ORDER — INSULIN ASPART 100 UNIT/ML ~~LOC~~ SOLN
0.0000 [IU] | Freq: Three times a day (TID) | SUBCUTANEOUS | Status: DC
  Administered 2016-07-13: 2 [IU] via SUBCUTANEOUS
  Administered 2016-07-14 – 2016-07-15 (×4): 3 [IU] via SUBCUTANEOUS

## 2016-07-13 MED ORDER — METOCLOPRAMIDE HCL 10 MG PO TABS: 10.0000 mg | ORAL_TABLET | Freq: Four times a day (QID) | ORAL | Status: DC | PRN

## 2016-07-13 MED ORDER — ONDANSETRON HCL 4 MG/2ML IJ SOLN: 4.0000 mg | Freq: Four times a day (QID) | INTRAMUSCULAR | Status: DC | PRN

## 2016-07-13 MED ORDER — INSULIN ASPART 100 UNIT/ML ~~LOC~~ SOLN
0.0000 [IU] | Freq: Every day | SUBCUTANEOUS | Status: DC
  Administered 2016-07-13 – 2016-07-14 (×2): 2 [IU] via SUBCUTANEOUS

## 2016-07-13 MED ORDER — MUPIROCIN 2 % EX OINT: 1.0000 "application " | TOPICAL_OINTMENT | Freq: Every day | CUTANEOUS | Status: DC | PRN

## 2016-07-13 MED ORDER — FUROSEMIDE 10 MG/ML IJ SOLN
20.0000 mg | Freq: Once | INTRAMUSCULAR | Status: AC
  Administered 2016-07-13: 20 mg via INTRAVENOUS
  Filled 2016-07-13: qty 2

## 2016-07-13 NOTE — ED Provider Notes (Signed)
[facility] DEPT Provider Note   CSN: [id number] Arrival date & time: 07/13/16  1120     History   Chief Complaint Chief Complaint  Patient presents with  . Dark stool    HPI David [name] is a 56 y.o. male.  56 yo M with a chief complaint of dark stool. Patient noticed that this morning. He had a period of constipation over the past couple days and thought that his stool looked different character. Denies abdominal pain denies lightheadedness or weakness. Is currently undergoing chemotherapy for a right renal cancer. Denies any current issues with his chemotherapy.   The history is provided by the patient.  Illness  This is a new problem. The current episode started yesterday. The problem occurs constantly. The problem has not changed since onset.Pertinent negatives include no chest pain, no abdominal pain, no headaches and no shortness of breath. Nothing aggravates the symptoms. Nothing relieves the symptoms. He has tried nothing for the symptoms. The treatment provided no relief.    Past Medical History:  Diagnosis Date  . Cancer ([place])   . Dental caries   . Diabetes mellitus, type II ([place]) 01/2012  . Hyperlipidemia   . Hypertension   . Influenza vaccine refused 01/18/2013  . Kidney cancer, primary, with metastasis from kidney to other site [facility])   . Obesity   . Pneumococcal vaccine refused 01/18/2013  . Scoliosis   . Tobacco use   . Vision impairment    left, due to prior trauma    Patient Active Problem List   Diagnosis Date Noted  . Abscess of buttock, right 03/24/2016  . Elevated CK 03/24/2016  . Myalgia 03/24/2016  . Edema 03/24/2016  . Folliculitis 34/19/3790  . Rash 03/24/2016  . Acute serous otitis media 03/17/2016  . Port catheter in place 12/11/2015  . Protein-calorie malnutrition, severe 11/15/2015  . Hyponatremia 11/14/2015  . Hypoalbuminemia due to protein-calorie malnutrition ([place]) 11/14/2015  . Generalized weakness 11/14/2015  . Mild  dehydration 11/14/2015  . SIRS (systemic inflammatory response syndrome) ([facility])   . S/P thoracentesis   . Fever, unspecified 10/25/2015  . Pleural effusion on right 10/25/2015  . Pleural mass 10/25/2015  . Symptomatic anemia 10/24/2015  . 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 Abnormal CT of the chest 10/03/2015  . Screening for prostate cancer 10/03/2015  . Creatinine elevation 10/03/2015  . Pulmonary nodules 10/03/2015  . Loss of weight 10/03/2015  . Lymphadenopathy 10/03/2015  . History of colonic polyps 10/03/2015  . Leukocytosis 10/03/2015  . Anemia, unspecified 10/03/2015  . Former smoker 10/03/2015  . DM2 (diabetes mellitus, type 2) ([place]) 01/18/2013  . Obesity, unspecified 01/18/2013    Past Surgical History:  Procedure Laterality Date  . COLONOSCOPY W/ POLYPECTOMY    . EYE SURGERY  1996   due to trauma, left eye  . IR GENERIC HISTORICAL  11/26/2015   IR US GUIDE VASC ACCESS RIGHT 11/26/2015 [facility]  . IR GENERIC HISTORICAL  11/26/2015   IR FLUORO GUIDE PORT INSERTION RIGHT 11/26/2015 [facility]       Home Medications    Prior to Admission medications   Medication Sig Start Date End Date Taking? Authorizing Provider  carvedilol (COREG) 3.125 MG tablet take 1 tablet by mouth twice a day with meals 03/31/16  Yes Tysinger, Camelia Eng, PA-C  chlorproMAZINE (THORAZINE) 50 MG tablet Take 1 tablet (50 mg total) by mouth 3 (three) times daily as needed for hiccoughs. 07/09/16  Yes Shadad, Mathis Dad, MD  clonazePAM (KLONOPIN) 0.5 MG tablet take 1 tablet by mouth 1 TO 2 TIMES DAILY AS NEEDED FOR ANXIETY.MAY CAUSE SEDATION 07/09/16  Yes Wyatt Portela, MD  megestrol (MEGACE ES) 625 MG/5ML suspension Take 5 mLs (625 mg total) by mouth daily. 06/24/16  Yes Wyatt Portela, MD  metFORMIN (GLUCOPHAGE) 500 MG tablet Take 1 tablet (500 mg total) by mouth 2 (two) times daily with a meal. 01/29/16  Yes Tysinger, Camelia Eng, PA-C  morphine (MS CONTIN) 30 MG 12 hr tablet Take 1  tablet (30 mg total) by mouth every 12 (twelve) hours. 07/09/16  Yes Wyatt Portela, MD  Multiple Vitamins-Minerals (MULTI-VITAMIN GUMMIES PO) Take 1 each by mouth daily.    Yes [provider]  nystatin (MYCOSTATIN) 100000 UNIT/ML suspension Use as directed 5 mLs (500,000 Units total) in the mouth or throat 4 (four) times daily. 06/10/16  Yes Leo Grosser, MD  oxyCODONE (OXY IR/ROXICODONE) 5 MG immediate release tablet Take 1 tablet (5 mg total) by mouth every 4 (four) hours as needed for severe pain. 07/09/16  Yes Wyatt Portela, MD  testosterone cypionate (DEPOTESTOSTERONE CYPIONATE) 200 MG/ML injection Inject 1 mL (200 mg total) into the muscle every 14 (fourteen) days. 06/19/16  Yes Tysinger, Camelia Eng, PA-C  chlorproMAZINE (THORAZINE) 50 MG tablet Take 1 tablet (50 mg total) by mouth 3 (three) times daily as needed for hiccoughs. Patient not taking: Reported on 07/13/2016 07/09/16   Wyatt Portela, MD  doxycycline (VIBRAMYCIN) 50 MG capsule Take 1 capsule (50 mg total) by mouth 2 (two) times daily. Patient not taking: Reported on 07/13/2016 06/26/16   Tysinger, Camelia Eng, PA-C  lidocaine-prilocaine (EMLA) cream Apply 1 application topically as needed. Patient not taking: Reported on 07/13/2016 04/15/16   Wyatt Portela, MD  metoCLOPramide (REGLAN) 10 MG tablet Take 1 tablet (10 mg total) by mouth every 6 (six) hours as needed for nausea. Patient not taking: Reported on 07/13/2016 06/27/16   Wyatt Portela, MD  mupirocin ointment (BACTROBAN) 2 % Apply 1 application topically 3 (three) times daily. Patient not taking: Reported on 07/13/2016 03/24/16   Carlena Hurl, PA-C  tadalafil (CIALIS) 20 MG tablet Take 0.5-1 tablets (10-20 mg total) by mouth every other day as needed for erectile dysfunction. Patient not taking: Reported on 07/13/2016 05/29/16   Tysinger, Camelia Eng, PA-C    Family History Family History  Problem Relation Age of Onset  . Diabetes Sister   . Other Sister        gastric  bypass  . Hyperlipidemia Mother   . Diabetes Mother   . Cancer Father 48       prostate cancer  . Stroke Maternal Grandmother   . Heart disease Neg Hx   . Hypertension Neg Hx   . Colon cancer Neg Hx     Social History Social History  Substance Use Topics  . Smoking status: Former Smoker    Packs/day: 0.25    Years: 20.00    Types: Cigarettes  . Smokeless tobacco: Never Used  . Alcohol use No     Allergies   Patient has no known allergies.   Review of Systems Review of Systems  Constitutional: Negative for chills and fever.  HENT: Negative for congestion and facial swelling.   Eyes: Negative for discharge and visual disturbance.  Respiratory: Negative for shortness of breath.   Cardiovascular: Negative for chest pain and palpitations.  Gastrointestinal: Negative for abdominal pain, diarrhea and vomiting. Blood in stool: dark  stool.  Musculoskeletal: Negative for arthralgias and myalgias.  Skin: Negative for color change and rash.  Neurological: Negative for tremors, syncope and headaches.  Psychiatric/Behavioral: Negative for confusion and dysphoric mood.     Physical Exam Updated Vital Signs BP (!) 95/55 (BP Location: Right Arm)   Pulse 98   Temp 98.9 F (37.2 C)   Resp 18   Ht 5\' 9"  (1.753 m)   Wt 76.9 kg (169 lb 9 oz)   SpO2 100%   BMI 25.04 kg/m   Physical Exam  Constitutional: He is oriented to person, place, and time. He appears well-developed and well-nourished.  HENT:  Head: Normocephalic and atraumatic.  Eyes: EOM are normal. Pupils are equal, round, and reactive to light.  Neck: Normal range of motion. Neck supple. No JVD present.  Cardiovascular: Normal rate and regular rhythm.  Exam reveals no gallop and no friction rub.   No murmur heard. Pulmonary/Chest: No respiratory distress. He has no wheezes.  Abdominal: He exhibits no distension and no mass. There is no tenderness. There is no rebound and no guarding.  Genitourinary:  Genitourinary  Comments: Soft brown stool on rectal exam. When I showed the patient do stool my glove he confirmed that that was the color he was concerned about.  Musculoskeletal: Normal range of motion.  Neurological: He is alert and oriented to person, place, and time.  Skin: No rash noted. No pallor.  Psychiatric: He has a normal mood and affect. His behavior is normal.  Nursing note and vitals reviewed.    ED Treatments / Results  Labs (all labs ordered are listed, but only abnormal results are displayed) Labs Reviewed  CBC WITH DIFFERENTIAL/PLATELET - Abnormal; Notable for the following:       Result Value   WBC 19.0 (*)    RBC 2.68 (*)    Hemoglobin 7.7 (*)    HCT 24.3 (*)    RDW 16.0 (*)    Neutro Abs 14.0 (*)    Monocytes Absolute 2.4 (*)    All other components within normal limits  BASIC METABOLIC PANEL - Abnormal; Notable for the following:    Sodium 132 (*)    Glucose, Bld 219 (*)    BUN 22 (*)    Calcium 8.6 (*)    All other components within normal limits  RETICULOCYTES - Abnormal; Notable for the following:    RBC. 2.66 (*)    All other components within normal limits  I-STAT CHEM 8, ED - Abnormal; Notable for the following:    Sodium 132 (*)    Chloride 99 (*)    BUN 21 (*)    Glucose, Bld 223 (*)    Calcium, Ion 1.11 (*)    Hemoglobin 7.8 (*)    HCT 23.0 (*)    All other components within normal limits  URINE CULTURE  CULTURE, EXPECTORATED SPUTUM-ASSESSMENT  VITAMIN B12  FOLATE  IRON AND TIBC  FERRITIN  HEMOGLOBIN A1C  URINALYSIS, ROUTINE W REFLEX MICROSCOPIC  POC OCCULT BLOOD, ED  TYPE AND SCREEN  PREPARE RBC (CROSSMATCH)    EKG  EKG Interpretation None       Radiology Dg Chest 2 View  Result Date: 07/13/2016 CLINICAL DATA:  Generalized weakness, history of transitional cell carcinoma EXAM: CHEST  2 VIEW COMPARISON:  06/18/2016 FINDINGS: Cardiac shadow is stable. Right-sided chest wall is within normal limits. The left lung is clear. Multiple masses  are in again identified within the right hemithorax consistent with patient's known history  of metastatic disease. These are stable in appearance. Small right-sided pleural effusion is noted. No bony abnormality is seen. IMPRESSION: Stable right-sided metastatic disease similar to that seen on recent CT. No acute abnormality noted. Electronically Signed   By: Inez Catalina M.D.   On: 07/13/2016 15:25    Procedures Procedures (including critical care time)  Medications Ordered in ED Medications  heparin lock flush 100 unit/mL (0 Units Intracatheter Hold 07/13/16 1432)  0.9 %  sodium chloride infusion (not administered)  diphenhydrAMINE (BENADRYL) injection 25 mg (not administered)  furosemide (LASIX) injection 20 mg (not administered)  insulin aspart (novoLOG) injection 0-9 Units (not administered)  insulin aspart (novoLOG) injection 0-5 Units (not administered)  levofloxacin (LEVAQUIN) tablet 750 mg (not administered)  mupirocin ointment (BACTROBAN) 2 % 1 application (not administered)  carvedilol (COREG) tablet 3.125 mg (not administered)  lidocaine-prilocaine (EMLA) cream 1 application (not administered)  nystatin (MYCOSTATIN) 100000 UNIT/ML suspension 500,000 Units (not administered)  testosterone cypionate (DEPOTESTOSTERONE CYPIONATE) injection 200 mg (not administered)  oxyCODONE (Oxy IR/ROXICODONE) immediate release tablet 5 mg (not administered)  chlorproMAZINE (THORAZINE) tablet 50 mg (not administered)  clonazePAM (KLONOPIN) tablet 0.5 mg (not administered)  ondansetron (ZOFRAN) injection 4 mg (not administered)  albuterol (PROVENTIL) (2.5 MG/3ML) 0.083% nebulizer solution 2.5 mg (not administered)  acetaminophen (TYLENOL) tablet 650 mg (not administered)  pantoprazole (PROTONIX) EC tablet 40 mg (not administered)  megestrol (MEGACE) 400 MG/10ML suspension 800 mg (not administered)     Initial Impression / Assessment and Plan / ED Course  I have reviewed the triage vital signs  and the nursing notes.  Pertinent labs & imaging results that were available during my care of the patient were reviewed by me and considered in my medical decision making (see chart for details).     56 yo M With a chief complaint of dark stool. Rectal exam was soft and brown stool. Heme - Labs were drawn as the patient is undergoing chemotherapy.  Patient is noted to have a 2 g hemoglobin drop from just 4 days ago. Stool is heme-negative. I discussed with the patient recent benefits of admission. Patient is currently requesting be discharged however on the nurse going back to the access his port he then stated that he would like to coming to the hospital. Will discuss with the hospitalist.  The patients results and plan were reviewed and discussed.   Any x-rays performed were independently reviewed by myself.   Differential diagnosis were considered with the presenting HPI.  Medications  heparin lock flush 100 unit/mL (0 Units Intracatheter Hold 07/13/16 1432)  0.9 %  sodium chloride infusion (not administered)  diphenhydrAMINE (BENADRYL) injection 25 mg (not administered)  furosemide (LASIX) injection 20 mg (not administered)  insulin aspart (novoLOG) injection 0-9 Units (not administered)  insulin aspart (novoLOG) injection 0-5 Units (not administered)  levofloxacin (LEVAQUIN) tablet 750 mg (not administered)  mupirocin ointment (BACTROBAN) 2 % 1 application (not administered)  carvedilol (COREG) tablet 3.125 mg (not administered)  lidocaine-prilocaine (EMLA) cream 1 application (not administered)  nystatin (MYCOSTATIN) 100000 UNIT/ML suspension 500,000 Units (not administered)  testosterone cypionate (DEPOTESTOSTERONE CYPIONATE) injection 200 mg (not administered)  oxyCODONE (Oxy IR/ROXICODONE) immediate release tablet 5 mg (not administered)  chlorproMAZINE (THORAZINE) tablet 50 mg (not administered)  clonazePAM (KLONOPIN) tablet 0.5 mg (not administered)  ondansetron (ZOFRAN)  injection 4 mg (not administered)  albuterol (PROVENTIL) (2.5 MG/3ML) 0.083% nebulizer solution 2.5 mg (not administered)  acetaminophen (TYLENOL) tablet 650 mg (not administered)  pantoprazole (PROTONIX) EC tablet 40  mg (not administered)  megestrol (MEGACE) 400 MG/10ML suspension 800 mg (not administered)    Vitals:   07/13/16 1131 07/13/16 1425 07/13/16 1553  BP: 120/77 111/71 (!) 95/55  Pulse: 99 95 98  Resp: 16 17 18   Temp: 99.4 F (37.4 C) 98.9 F (37.2 C)   TempSrc: Oral    SpO2: 99% 98% 100%  Weight: 76.9 kg (169 lb 9 oz)    Height: 5\' 9"  (1.753 m)      Final diagnoses:  Anemia due to antineoplastic chemotherapy    Admission/ observation were discussed with the admitting physician, patient and/or family and they are comfortable with the plan.    Final Clinical Impressions(s) / ED Diagnoses   Final diagnoses:  Anemia due to antineoplastic chemotherapy    New Prescriptions Current Discharge Medication List       Deno Etienne, DO 07/13/16 1623

## 2016-07-13 NOTE — ED Notes (Signed)
Singh, MD at bedside.

## 2016-07-13 NOTE — H&P (Signed)
TRH H&P   Patient Demographics:    David [name], is a 56 y.o. male  MRN: [id number]   DOB - 04-05-1960  Admit Date - 07/13/2016  Outpatient Primary MD for the patient is Tysinger, Camelia Eng, PA-C  Outpatient Specialists: Dr Alen Blew   Patient coming from: Home  Chief Complaint  Patient presents with  . Dark stool      HPI:    David [name]  is a 56 y.o. 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 Patient lives at home was doing fairly well except for some generalized weakness, he's been feeling weak for the last 2-3 days and has had a mild productive cough, this morning he noticed a dark stool and came to the ER.  In the ER he was Hemoccult negative, he did have 2 g of hemoglobin drop as compared to a few days ago from outpatient lab draw, he also had mild leukocytosis. Was feeling generally weak and I was called for admission for symptomatic anemia. Patient denies any headache, no fever or chills, no chest or abdominal pain, no shortness of breath, does have a mild productive cough for the last few days, no focal weakness. No blood in stool or urine. No other GI symptoms. No history of GI bleed in the past.    Review of systems:    In addition to the HPI above,  No Fever-chills, No Headache, No changes with Vision or hearing, No problems swallowing food or Liquids, No Chest pain, Cough or Shortness of Breath, No Abdominal pain, No Nausea or Vommitting, Bowel movements are  regular, No Blood in stool or Urine,But dark stool this morning No dysuria, No new skin rashes or bruises, No new joints pains-aches,  No new weakness, tingling, numbness in any extremity, positive generalized weakness No recent weight gain or loss, No polyuria, polydypsia or polyphagia, No significant Mental Stressors.  A full 10 point Review of Systems was done, except as stated above, all other Review of Systems were negative.   With Past History of the following :    Past Medical History:  Diagnosis Date  . Cancer ([place])   . Dental caries   .  Diabetes mellitus, type II ([place]) 01/2012  . Hyperlipidemia   . Hypertension   . Influenza vaccine refused 01/18/2013  . Kidney cancer, primary, with metastasis from kidney to other site [facility])   . Obesity   . Pneumococcal vaccine refused 01/18/2013  . Scoliosis   . Tobacco use   . Vision impairment    left, due to prior trauma      Past Surgical History:  Procedure Laterality Date  . COLONOSCOPY W/ POLYPECTOMY    . EYE SURGERY  1996   due to trauma, left eye  . IR GENERIC HISTORICAL  11/26/2015   IR US GUIDE VASC ACCESS RIGHT 11/26/2015 [facility]  . IR GENERIC HISTORICAL  11/26/2015   IR FLUORO GUIDE PORT INSERTION RIGHT 11/26/2015 [facility]      Social History:     Social History  Substance Use Topics  . Smoking status: Former Smoker    Packs/day: 0.25    Years: 20.00    Types: Cigarettes  . Smokeless tobacco: Never Used  . Alcohol use No         Family History :     Family History  Problem Relation Age of Onset  . Diabetes Sister   . Other Sister        gastric bypass  . Hyperlipidemia Mother   . Diabetes Mother   . Cancer Father 65       prostate cancer  . Stroke Maternal Grandmother   . Heart disease Neg Hx   . Hypertension Neg Hx   . Colon cancer Neg Hx        Home Medications:   Prior to Admission medications   Medication Sig Start Date End Date Taking? Authorizing Provider  carvedilol  (COREG) 3.125 MG tablet take 1 tablet by mouth twice a day with meals 03/31/16   Tysinger, Camelia Eng, PA-C  chlorproMAZINE (THORAZINE) 50 MG tablet Take 1 tablet (50 mg total) by mouth 3 (three) times daily as needed for hiccoughs. 07/09/16   Wyatt Portela, MD  chlorproMAZINE (THORAZINE) 50 MG tablet Take 1 tablet (50 mg total) by mouth 3 (three) times daily as needed for hiccoughs. 07/09/16   Shadad, Mathis Dad, MD  clonazePAM (KLONOPIN) 0.5 MG tablet take 1 tablet by mouth 1 TO 2 TIMES DAILY AS NEEDED FOR ANXIETY.MAY CAUSE SEDATION 07/09/16   Wyatt Portela, MD  doxycycline (VIBRAMYCIN) 50 MG capsule Take 1 capsule (50 mg total) by mouth 2 (two) times daily. 06/26/16   Tysinger, Camelia Eng, PA-C  lidocaine-prilocaine (EMLA) cream Apply 1 application topically as needed. Patient taking differently: Apply 1 application topically as needed (for port access).  04/15/16   Wyatt Portela, MD  megestrol (MEGACE ES) 625 MG/5ML suspension Take 5 mLs (625 mg total) by mouth daily. 06/24/16   Wyatt Portela, MD  metFORMIN (GLUCOPHAGE) 500 MG tablet Take 1 tablet (500 mg total) by mouth 2 (two) times daily with a meal. 01/29/16   Tysinger, Camelia Eng, PA-C  metoCLOPramide (REGLAN) 10 MG tablet Take 1 tablet (10 mg total) by mouth every 6 (six) hours as needed for nausea. 06/27/16   Wyatt Portela, MD  morphine (MS CONTIN) 30 MG 12 hr tablet Take 1 tablet (30 mg total) by mouth every 12 (twelve) hours. 07/09/16   Wyatt Portela, MD  Multiple Vitamins-Minerals (MULTI-VITAMIN GUMMIES PO) Take 1 each by mouth daily.     [provider]  mupirocin ointment (BACTROBAN) 2 % Apply 1 application topically  3 (three) times daily. Patient taking differently: Apply 1 application topically daily as needed (for rash).  03/24/16   Tysinger, Camelia Eng, PA-C  nystatin (MYCOSTATIN) 100000 UNIT/ML suspension Use as directed 5 mLs (500,000 Units total) in the mouth or throat 4 (four) times daily. 06/10/16   Leo Grosser, MD  oxyCODONE (OXY  IR/ROXICODONE) 5 MG immediate release tablet Take 1 tablet (5 mg total) by mouth every 4 (four) hours as needed for severe pain. 07/09/16   Wyatt Portela, MD  tadalafil (CIALIS) 20 MG tablet Take 0.5-1 tablets (10-20 mg total) by mouth every other day as needed for erectile dysfunction. 05/29/16   Tysinger, Camelia Eng, PA-C  testosterone cypionate (DEPOTESTOSTERONE CYPIONATE) 200 MG/ML injection Inject 1 mL (200 mg total) into the muscle every 14 (fourteen) days. 06/19/16   Tysinger, Camelia Eng, PA-C     Allergies:    No Known Allergies   Physical Exam:   Vitals  Blood pressure 111/71, pulse 95, temperature 98.9 F (37.2 C), resp. rate 17, height 5\' 9"  (1.753 m), weight 76.9 kg (169 lb 9 oz), SpO2 98 %.   1. General Middle-aged African-American male lying in bed in NAD,     2. Normal affect and insight, Not Suicidal or Homicidal, Awake Alert, Oriented X 3.  3. No F.N deficits, ALL C.Nerves Intact, Strength 5/5 all 4 extremities, Sensation intact all 4 extremities, Plantars down going.  4. Ears and Eyes appear Normal, Conjunctivae clear, PERRLA. Moist Oral Mucosa.  5. Supple Neck, No JVD, No cervical lymphadenopathy appriciated, No Carotid Bruits.  6. Symmetrical Chest wall movement, Good air movement bilaterally, CTAB. Right subclavian Port-A-Cath site appears stable  7. RRR, No Gallops, Rubs or Murmurs, No Parasternal Heave.  8. Positive Bowel Sounds, Abdomen Soft, No tenderness, No organomegaly appriciated,No rebound -guarding or rigidity. Hemoccult-negative stool.  9.  No Cyanosis, Normal Skin Turgor, No Skin Rash or Bruise.  10. Good muscle tone,  joints appear normal , no effusions, Normal ROM.  11. No Palpable Lymph Nodes in Neck or Axillae      Data Review:    CBC  Recent Labs Lab 07/09/16 0946 07/13/16 1242 07/13/16 1254  WBC 17.4* 19.0*  --   HGB 9.8* 7.7* 7.8*  HCT 31.0* 24.3* 23.0*  PLT 319 326  --   MCV 92.0 90.7  --   MCH 29.1 28.7  --   MCHC 31.6* 31.7   --   RDW 15.8* 16.0*  --   LYMPHSABS 2.0 2.5  --   MONOABS 2.2* 2.4*  --   EOSABS 0.0 0.0  --   BASOSABS 0.0 0.0  --    ------------------------------------------------------------------------------------------------------------------  Chemistries   Recent Labs Lab 07/09/16 0946 07/13/16 1242 07/13/16 1254  NA 133* 132* 132*  K 5.3* 5.1 5.0  CL  --  101 99*  CO2 21* 24  --   GLUCOSE 227* 219* 223*  BUN 27.6* 22* 21*  CREATININE 1.3 0.95 0.90  CALCIUM 10.7* 8.6*  --   AST 10  --   --   ALT 11  --   --   ALKPHOS 156*  --   --   BILITOT 1.31*  --   --    ------------------------------------------------------------------------------------------------------------------ estimated creatinine clearance is 90.6 mL/min (by C-G formula based on SCr of 0.9 mg/dL). ------------------------------------------------------------------------------------------------------------------ No results for input(s): TSH, T4TOTAL, T3FREE, THYROIDAB in the last 72 hours.  Invalid input(s): FREET3  Coagulation profile No results for input(s): INR, PROTIME in the last 168  hours. ------------------------------------------------------------------------------------------------------------------- No results for input(s): DDIMER in the last 72 hours. -------------------------------------------------------------------------------------------------------------------  Cardiac Enzymes No results for input(s): CKMB, TROPONINI, MYOGLOBIN in the last 168 hours.  Invalid input(s): CK ------------------------------------------------------------------------------------------------------------------    Component Value Date/Time   BNP 53.9 03/19/2016 2120     ---------------------------------------------------------------------------------------------------------------  Urinalysis    Component Value Date/Time   COLORURINE YELLOW 06/30/2016 2123   APPEARANCEUR CLEAR 06/30/2016 2123   LABSPEC 1.012  06/30/2016 2123   PHURINE 6.0 06/30/2016 2123   GLUCOSEU NEGATIVE 06/30/2016 2123   HGBUR NEGATIVE 06/30/2016 2123   BILIRUBINUR NEGATIVE 06/30/2016 2123   BILIRUBINUR NEG 01/18/2013 0859   KETONESUR NEGATIVE 06/30/2016 2123   PROTEINUR 30 (A) 06/30/2016 2123   UROBILINOGEN negative 01/18/2013 0859   UROBILINOGEN 1.0 02/12/2012 1520   NITRITE NEGATIVE 06/30/2016 2123   LEUKOCYTESUR TRACE (A) 06/30/2016 2123    ----------------------------------------------------------------------------------------------------------------   Imaging Results:    No results found.     Assessment & Plan:      1. Symptomatic anemia most likely anemia induced from chemotherapy. He has required outpatient blood transfusions in the past, he was Hemoccult negative, we'll type screen, anemia panel, transfuse 1 unit as he is symptomatic. Place on oral PPI. If no further drop in H&H then discharged in the morning.  2. History of metastatic left urothelial transitional cell cancer with metastases to his lungs. Undergoing chemotherapy every 3 weeks, last chemotherapy 2 weeks ago, post discharge follow with Dr. Alen Blew he has been added to the treatment team.  3. Chronic hip cups. Continue home regimen of Thorazine and Reglan.  4. Chronic pain. Continue home narcotic regimen, he does not want to take long-acting morphine as it makes him little confused. Discontinue long-acting morphine.  5. DM type II. Hold Glucophage, check A1c and sliding scale.  6. Generalized weakness. Due to #1 and 2 above. Supportive care, and PT.  7. Mild leukocytosis along with productive cough for the last few days. Leukocytosis has some chronic component to it and most likely is chronic, Will check 2 view chest x-ray, sputum Gram stain culture, oral Levaquin for bronchitis, also check UA to rule out UTI.   DVT Prophylaxis  SCDs     AM Labs Ordered, also please review Full Orders  Family Communication: Admission, patients  condition and plan of care including tests being ordered have been discussed with the patient and wife who indicate understanding and agree with the plan and Code Status.  Code Status Full  Likely DC to  Home  Condition GUARDED    Consults called: None    Admission status: Obs    Time spent in minutes : 35   Lala Lund M.D on 07/13/2016 at 2:59 PM  Between 7am to 7pm - Pager - [phone] ( page via [facility], text pages only, please mention full 10 digit call back number).  After 7pm go to [email or web address] - password [facility]  [facility] - Office  [phone]

## 2016-07-13 NOTE — ED Triage Notes (Signed)
Patients wife states pt was constipated and tried laxative pills. Friday he took a colon cleanse pill and is now having dark stools. Pt ambulatory and denies any pain. Last BM right before arrival.

## 2016-07-14 DIAGNOSIS — G8929 Other chronic pain: Secondary | ICD-10-CM | POA: Diagnosis present

## 2016-07-14 DIAGNOSIS — E875 Hyperkalemia: Secondary | ICD-10-CM

## 2016-07-14 DIAGNOSIS — Z87891 Personal history of nicotine dependence: Secondary | ICD-10-CM | POA: Diagnosis not present

## 2016-07-14 DIAGNOSIS — D638 Anemia in other chronic diseases classified elsewhere: Secondary | ICD-10-CM

## 2016-07-14 DIAGNOSIS — E119 Type 2 diabetes mellitus without complications: Secondary | ICD-10-CM | POA: Diagnosis present

## 2016-07-14 DIAGNOSIS — E785 Hyperlipidemia, unspecified: Secondary | ICD-10-CM | POA: Diagnosis present

## 2016-07-14 DIAGNOSIS — D649 Anemia, unspecified: Secondary | ICD-10-CM | POA: Diagnosis not present

## 2016-07-14 DIAGNOSIS — Z79891 Long term (current) use of opiate analgesic: Secondary | ICD-10-CM | POA: Diagnosis not present

## 2016-07-14 DIAGNOSIS — C642 Malignant neoplasm of left kidney, except renal pelvis: Secondary | ICD-10-CM | POA: Diagnosis present

## 2016-07-14 DIAGNOSIS — C649 Malignant neoplasm of unspecified kidney, except renal pelvis: Secondary | ICD-10-CM | POA: Diagnosis not present

## 2016-07-14 DIAGNOSIS — T451X5A Adverse effect of antineoplastic and immunosuppressive drugs, initial encounter: Secondary | ICD-10-CM | POA: Diagnosis present

## 2016-07-14 DIAGNOSIS — N390 Urinary tract infection, site not specified: Secondary | ICD-10-CM | POA: Diagnosis present

## 2016-07-14 DIAGNOSIS — M419 Scoliosis, unspecified: Secondary | ICD-10-CM | POA: Diagnosis present

## 2016-07-14 DIAGNOSIS — Z79899 Other long term (current) drug therapy: Secondary | ICD-10-CM | POA: Diagnosis not present

## 2016-07-14 DIAGNOSIS — E669 Obesity, unspecified: Secondary | ICD-10-CM | POA: Diagnosis present

## 2016-07-14 DIAGNOSIS — R066 Hiccough: Secondary | ICD-10-CM | POA: Diagnosis present

## 2016-07-14 DIAGNOSIS — D6481 Anemia due to antineoplastic chemotherapy: Secondary | ICD-10-CM | POA: Diagnosis present

## 2016-07-14 DIAGNOSIS — K5900 Constipation, unspecified: Secondary | ICD-10-CM | POA: Diagnosis present

## 2016-07-14 DIAGNOSIS — C7800 Secondary malignant neoplasm of unspecified lung: Secondary | ICD-10-CM | POA: Diagnosis present

## 2016-07-14 DIAGNOSIS — I119 Hypertensive heart disease without heart failure: Secondary | ICD-10-CM | POA: Diagnosis present

## 2016-07-14 LAB — CBC
HCT: 26.2 % — ABNORMAL LOW (ref 39.0–52.0)
HCT: 32.2 % — ABNORMAL LOW (ref 39.0–52.0)
Hemoglobin: 8.4 g/dL — ABNORMAL LOW (ref 13.0–17.0)
Hemoglobin: 10.5 g/dL — ABNORMAL LOW (ref 13.0–17.0)
MCH: 29 pg (ref 26.0–34.0)
MCH: 29.9 pg (ref 26.0–34.0)
MCHC: 32.1 g/dL (ref 30.0–36.0)
MCHC: 32.6 g/dL (ref 30.0–36.0)
MCV: 90.3 fL (ref 78.0–100.0)
MCV: 91.7 fL (ref 78.0–100.0)
PLATELETS: 330 10*3/uL (ref 150–400)
Platelets: 317 10*3/uL (ref 150–400)
RBC: 2.9 MIL/uL — AB (ref 4.22–5.81)
RBC: 3.51 MIL/uL — AB (ref 4.22–5.81)
RDW: 15.8 % — ABNORMAL HIGH (ref 11.5–15.5)
RDW: 15.5 % (ref 11.5–15.5)
WBC: 19.8 10*3/uL — ABNORMAL HIGH (ref 4.0–10.5)
WBC: 19.4 10*3/uL — AB (ref 4.0–10.5)

## 2016-07-14 LAB — BASIC METABOLIC PANEL
Anion gap: 8 (ref 5–15)
Anion gap: 10 (ref 5–15)
BUN: 23 mg/dL — AB (ref 6–20)
BUN: 25 mg/dL — ABNORMAL HIGH (ref 6–20)
CALCIUM: 8.8 mg/dL — AB (ref 8.9–10.3)
CHLORIDE: 100 mmol/L — AB (ref 101–111)
CHLORIDE: 97 mmol/L — AB (ref 101–111)
CO2: 24 mmol/L (ref 22–32)
CO2: 26 mmol/L (ref 22–32)
CREATININE: 1.16 mg/dL (ref 0.61–1.24)
Calcium: 8.5 mg/dL — ABNORMAL LOW (ref 8.9–10.3)
Creatinine, Ser: 1.1 mg/dL (ref 0.61–1.24)
GFR calc Af Amer: >60 mL/min (ref >60)
GLUCOSE: 174 mg/dL — AB (ref 65–99)
Glucose, Bld: 232 mg/dL — ABNORMAL HIGH (ref 65–99)
POTASSIUM: 5.6 mmol/L — AB (ref 3.5–5.1)
Potassium: 4.6 mmol/L (ref 3.5–5.1)
SODIUM: 133 mmol/L — AB (ref 135–145)
Sodium: 132 mmol/L — ABNORMAL LOW (ref 135–145)

## 2016-07-14 LAB — FERRITIN: FERRITIN: 3869 ng/mL — AB (ref 24–336)

## 2016-07-14 LAB — IRON AND TIBC
Iron: 21 ug/dL — ABNORMAL LOW (ref 45–182)
Saturation Ratios: 20 % (ref 17.9–39.5)
TIBC: 105 ug/dL — ABNORMAL LOW (ref 250–450)
UIBC: 84 ug/dL

## 2016-07-14 LAB — PREPARE RBC (CROSSMATCH)

## 2016-07-14 LAB — GLUCOSE, CAPILLARY
GLUCOSE-CAPILLARY: 206 mg/dL — AB (ref 65–99)
GLUCOSE-CAPILLARY: 215 mg/dL — AB (ref 65–99)
Glucose-Capillary: 232 mg/dL — ABNORMAL HIGH (ref 65–99)
Glucose-Capillary: 223 mg/dL — ABNORMAL HIGH (ref 65–99)

## 2016-07-14 LAB — FOLATE: Folate: 5.4 ng/mL — ABNORMAL LOW

## 2016-07-14 LAB — VITAMIN B12: Vitamin B-12: 196 pg/mL (ref 180–914)

## 2016-07-14 MED ORDER — ENSURE ENLIVE PO LIQD
237.0000 mL | Freq: Three times a day (TID) | ORAL | Status: DC
  Administered 2016-07-14: 237 mL via ORAL

## 2016-07-14 MED ORDER — LEVOFLOXACIN IN D5W 750 MG/150ML IV SOLN
750.0000 mg | INTRAVENOUS | Status: DC
  Administered 2016-07-14: 750 mg via INTRAVENOUS
  Filled 2016-07-14 (×2): qty 150

## 2016-07-14 MED ORDER — SODIUM CHLORIDE 0.9 % IV SOLN
Freq: Once | INTRAVENOUS | Status: AC
  Administered 2016-07-14: 09:00:00 via INTRAVENOUS

## 2016-07-14 MED ORDER — SODIUM POLYSTYRENE SULFONATE 15 GM/60ML PO SUSP
30.0000 g | Freq: Once | ORAL | Status: AC
Start: 2016-07-14 — End: 2016-07-14
  Administered 2016-07-14: 30 g via ORAL
  Filled 2016-07-14: qty 120

## 2016-07-14 MED ORDER — ADULT MULTIVITAMIN W/MINERALS CH
1.0000 | ORAL_TABLET | Freq: Every day | ORAL | Status: DC
  Administered 2016-07-14: 1 via ORAL
  Filled 2016-07-14 (×2): qty 1

## 2016-07-14 MED ORDER — OXYCODONE HCL 5 MG PO TABS
5.0000 mg | ORAL_TABLET | ORAL | Status: DC | PRN
  Administered 2016-07-14: 5 mg via ORAL
  Filled 2016-07-14: qty 1

## 2016-07-14 NOTE — Progress Notes (Signed)
Patient ID: David [name], male   DOB: [birth year], 56 y.o.   MRN: [id number]  PROGRESS NOTE    David [name]  JSE:[id number] DOB: 12-May-1960 DOA: 07/13/2016  PCP: Carlena Hurl, PA-C   Brief Narrative:  56 year old male with medical history or transitional cell carcinoma arising from left urothelial tract with metastases to lungs, on chemotherapy (under Dr. Hazeline Junker care), anemia, hypertension, dyslipidemia who presented to ED with generalized weakness for past 2-3 days prior to the admission. He also noticed dark stool. Hgb was 7.6 on admission. He was given 1 U PRBC transfusion.  Assessment & Plan:   Principal Problem:   Symptomatic anemia / Anemia of chronic disease - Likely due to malignancy - Status post 1 unit PRBC transfusion on admission - Will give 1 more unit PRBC today - Follow up CBC in am  Active Problems:   Hyperkalemia - Given kayexalate 30 gm - Repeat potassium today     UTI / Leukocytosis - Started empiric Levaquin    RCC - Follows with Dr. Alen Blew   DVT prophylaxis: SCD's  Code Status: full code  Family Communication: wife at bedside this am Disposition Plan: home in am if hgb stable and potassium WNL   Consultants:   None   Procedures:  None   Antimicrobials:   Levaquin 5/27 -->   Subjective: No overnight events.   Objective: Vitals:   07/14/16 0544 07/14/16 1115 07/14/16 1134 07/14/16 1329  BP: 101/61 105/72 (!) 89/53 107/61  Pulse: 92 91 90 87  Resp: 18 18 14 14   Temp: 97.8 F (36.6 C) 97.8 F (36.6 C) 97.6 F (36.4 C) 97.6 F (36.4 C)  TempSrc: Oral Oral Oral Oral  SpO2: 100% 100% 100% 100%  Weight:      Height:        Intake/Output Summary (Last 24 hours) at 07/14/16 1459 Last data filed at 07/14/16 1329  Gross per 24 hour  Intake           700.42 ml  Output              300 ml  Net           400.42 ml   Filed Weights   07/13/16 1131  Weight: 76.9 kg (169 lb 9 oz)    Examination:  General exam: Appears calm and  comfortable  Respiratory system: Clear to auscultation. Respiratory effort normal. Cardiovascular system: S1 & S2 heard, RRR. No JVD, murmurs, rubs, gallops or clicks. No pedal edema. Gastrointestinal system: Abdomen is nondistended, soft and nontender. No organomegaly or masses felt. Normal bowel sounds heard. Central nervous system: Alert and oriented. No focal neurological deficits. Extremities: Symmetric 5 x 5 power. Skin: No rashes, lesions or ulcers Psychiatry: Judgement and insight appear normal. Mood & affect appropriate.   Data Reviewed: I have personally reviewed following labs and imaging studies  CBC:  Recent Labs Lab 07/09/16 0946 07/13/16 1242 07/13/16 1254 07/13/16 2256 07/14/16 0357  WBC 17.4* 19.0*  --   --  19.8*  NEUTROABS 13.1* 14.0*  --   --   --   HGB 9.8* 7.7* 7.8* 8.6* 8.4*  HCT 31.0* 24.3* 23.0* 26.7* 26.2*  MCV 92.0 90.7  --   --  90.3  PLT 319 326  --   --  073   Basic Metabolic Panel:  Recent Labs Lab 07/09/16 0946 07/13/16 1242 07/13/16 1254 07/14/16 0357  NA 133* 132* 132* 132*  K 5.3* 5.1 5.0 5.6*  CL  --  101 99* 100*  CO2 21* 24  --  24  GLUCOSE 227* 219* 223* 174*  BUN 27.6* 22* 21* 23*  CREATININE 1.3 0.95 0.90 1.10  CALCIUM 10.7* 8.6*  --  8.5*   GFR: Estimated Creatinine Clearance: 74.1 mL/min (by C-G formula based on SCr of 1.1 mg/dL). Liver Function Tests:  Recent Labs Lab 07/09/16 0946  AST 10  ALT 11  ALKPHOS 156*  BILITOT 1.31*  PROT 8.4*  ALBUMIN 2.3*   No results for input(s): LIPASE, AMYLASE in the last 168 hours. No results for input(s): AMMONIA in the last 168 hours. Coagulation Profile: No results for input(s): INR, PROTIME in the last 168 hours. Cardiac Enzymes: No results for input(s): CKTOTAL, CKMB, CKMBINDEX, TROPONINI in the last 168 hours. BNP (last 3 results) No results for input(s): PROBNP in the last 8760 hours. HbA1C: No results for input(s): HGBA1C in the last 72 hours. CBG:  Recent  Labs Lab 07/13/16 1654 07/13/16 2056 07/14/16 0858 07/14/16 1152  GLUCAP 167* 225* 232* 223*   Lipid Profile: No results for input(s): CHOL, HDL, LDLCALC, TRIG, CHOLHDL, LDLDIRECT in the last 72 hours. Thyroid Function Tests: No results for input(s): TSH, T4TOTAL, FREET4, T3FREE, THYROIDAB in the last 72 hours. Anemia Panel:  Recent Labs  07/13/16 1242 07/13/16 1440  VITAMINB12  --  196  FOLATE  --  5.4*  FERRITIN  --  3,869*  TIBC  --  105*  IRON  --  21*  RETICCTPCT 1.9  --    Urine analysis:    Component Value Date/Time   COLORURINE YELLOW 07/13/2016 [place] 07/13/2016 1457   LABSPEC 1.013 07/13/2016 1457   PHURINE 6.0 07/13/2016 1457   GLUCOSEU 50 (A) 07/13/2016 1457   HGBUR NEGATIVE 07/13/2016 1457   BILIRUBINUR NEGATIVE 07/13/2016 1457   BILIRUBINUR NEG 01/18/2013 0859   KETONESUR NEGATIVE 07/13/2016 1457   PROTEINUR 30 (A) 07/13/2016 1457   UROBILINOGEN negative 01/18/2013 0859   UROBILINOGEN 1.0 02/12/2012 1520   NITRITE NEGATIVE 07/13/2016 1457   LEUKOCYTESUR TRACE (A) 07/13/2016 1457   Sepsis Labs: @LABRCNTIP (procalcitonin:4,lacticidven:4)   Recent Results (from the past 240 hour(s))  TECHNOLOGIST REVIEW     Status: None   Collection Time: 07/09/16  9:46 AM  Result Value Ref Range Status   Technologist Review Rare meta and myelocyte  Final      Radiology Studies: Dg Chest 2 View  Result Date: 07/13/2016 CLINICAL DATA:  Generalized weakness, history of transitional cell carcinoma EXAM: CHEST  2 VIEW COMPARISON:  06/18/2016 FINDINGS: Cardiac shadow is stable. Right-sided chest wall is within normal limits. The left lung is clear. Multiple masses are in again identified within the right hemithorax consistent with patient's known history of metastatic disease. These are stable in appearance. Small right-sided pleural effusion is noted. No bony abnormality is seen. IMPRESSION: Stable right-sided metastatic disease similar to that seen  on recent CT. No acute abnormality noted. Electronically Signed   By: Inez Catalina M.D.   On: 07/13/2016 15:25        Scheduled Meds: . carvedilol  3.125 mg Oral BID WC  . feeding supplement (ENSURE ENLIVE)  237 mL Oral TID BM  . heparin lock flush  500 Units Intracatheter Once  . insulin aspart  0-5 Units Subcutaneous QHS  . insulin aspart  0-9 Units Subcutaneous TID WC  . megestrol  800 mg Oral Daily  . multivitamin with minerals  1 tablet Oral Daily  . nystatin  5 mL  Mouth/Throat QID  . pantoprazole  40 mg Oral Daily  . [START ON 07/22/2016] testosterone cypionate  200 mg Intramuscular Q14 Days   Continuous Infusions: . levofloxacin (LEVAQUIN) IV Stopped (07/14/16 1011)     LOS: 0 days    Time spent:25 minutes  Greater than 50% of the time spent on counseling and coordinating the care.   Leisa Lenz, MD [facility] Pager [phone]  If 7PM-7AM, please contact night-coverage [email or web address] Password [facility] 07/14/2016, 2:59 PM

## 2016-07-14 NOTE — Progress Notes (Signed)
Initial Nutrition Assessment  DOCUMENTATION CODES:   Not applicable  INTERVENTION:  - Will order Ensure Enlive TID, each supplement provides 350 kcal and 20 grams of protein - Will order daily multivitamin with minerals. - Continue to encourage PO intakes of meals and supplements. - Discussed strategies for coping with lack of taste sensation and for increasing kcal in foods.  NUTRITION DIAGNOSIS:   Increased nutrient needs related to catabolic illness, cancer and cancer related treatments as evidenced by estimated needs.  GOAL:   Patient will meet greater than or equal to 90% of their needs  MONITOR:   PO intake, Supplement acceptance, Weight trends, Labs  REASON FOR ASSESSMENT:   Malnutrition Screening Tool  ASSESSMENT:   56 y.o. 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 Patient lives at home was doing fairly well except for some generalized weakness, he's been feeling weak for the last 2-3 days and has had a mild productive cough, this morning he noticed a dark stool and came to the ER.  Pt seen for MST. BMI indicates overweight status. No intakes documented since admission; pt reports that he ate an orange and grapes for breakfast this AM. He states that he was previously on chemo and then off of it x9 weeks before starting immunotherapy (of which he has had 1 dose so far). He states poor appetite and intakes while on chemo so he was prescribed Megace. Once chemo was d/c'ed his appetite returned and he was eating "everything and anything, I had a fantastic appetite" so his PCP encouraged him to "take myself off of the pill." Pt reports that since beginning immunotherapy he has had decreased desire to eat d/t lack of taste sensation. He  denies any other symptoms since starting this treatment. He has resumed taking Megace d/t this. Unable to get a clear idea from pt of typical intakes PTA/what a normal day of eating might be for him.   He was drinking 2 cartons of Orgain/day PTA (255 kcal and 16 grams of protein per carton) and he enjoyed this supplement. He had talked with [facility] RD on 5/23 about obtaining samples of Ensure and he was informed to pick them up 5/29; family at bedside report they will pick this up tomorrow since pt is hospitalized. He is interested in receiving Ensure during admission.  Pt and family interested in tips to increase PO intakes. Talked with pt about using increased seasonings, eating spicy and acidic foods. Pt reports he has tried these things and they have not been beneficial for him and that the orange and grapes this AM caused abdominal cramping which he has never experienced prior to this AM. Talked with him about adding kcal from fat sources (such gravies, sauces, butter, sour cream, yogurt) to his foods to increase kcal without adding much volume. Pt interested in trying this after d/c. Will continue to monitor for needs and assist as able.  Physical assessment to upper body shows no muscle and no fat wasting. Per chart review, pt has lost 36 lbs (17.5% body weight) in the past 1.5 months which pt states is accurate. Pt may meet criteria for malnutrition but unable to state at this time based on available information; will document further findings at follow-up.  Medications reviewed; 20 mg IV Lasix x1 dose yesterday, sliding scale Novolog, 800 mg Megace/day,  500000 units Mycostatin QID, 40 mg oral Protonix/day, 30 mg Kayexalate x1 dose today. Labs reviewed; CBG: 232 mg/dL this AM, Na: 132 mmol/L, Cl: 100 mmol/L, BUN: 23 mg/dL, Ca: 8.5 mg/dL.   Diet Order:  Diet Carb Modified Fluid consistency: Thin; Room service appropriate? Yes  Skin:  Reviewed, no issues  Last BM:  5/26  Height:   Ht Readings  from Last 1 Encounters:  07/13/16 5\' 9"  (1.753 m)    Weight:   Wt Readings from Last 1 Encounters:  07/13/16 169 lb 9 oz (76.9 kg)    Ideal Body Weight:  72.73 kg  BMI:  Body mass index is 25.04 kg/m.  Estimated Nutritional Needs:   Kcal:  2305-2540 (30-33 kcal/kg)  Protein:  110-123 grams (1.4-1.6 grams/kg)  Fluid:  >/= 2.2 L/day  EDUCATION NEEDS:   Education needs addressed    Jarome Matin, MS, RD, LDN, CNSC Inpatient Clinical Dietitian Pager # [phone] After hours/weekend pager # [phone]

## 2016-07-14 NOTE — Evaluation (Signed)
Physical Therapy Evaluation Patient Details Name: David [name] MRN: [id number] DOB: 1960-07-24 Today's Date: 07/14/2016   History of Present Illness  56 yo male admitted with symptomatic anemia. Hx of metastatic renal cancer-on chemo, DM, scoliosis, obesity  Clinical Impression  On eval, pt required Min assist for mobility. He walked ~125 feet in hallway with use of hallway handrail. Pt is currently unsteady with ambulation. Discussed d/c plan-pt and wife report pt will return home. They both decline HHPT follow up. Pt already has a RW available for use at home. Will follow and progress activity as tolerated.     Follow Up Recommendations No PT follow up;Supervision/Assistance - 24 hour (pt and wife decline HHPT follow up)    Equipment Recommendations  None recommended by PT    Recommendations for Other Services       Precautions / Restrictions Precautions Precautions: Fall Restrictions Weight Bearing Restrictions: No      Mobility  Bed Mobility Overal bed mobility: Needs Assistance Bed Mobility: Supine to Sit;Sit to Supine     Supine to sit: Supervision Sit to supine: Supervision   General bed mobility comments: for safety  Transfers Overall transfer level: Needs assistance   Transfers: Sit to/from Stand Sit to Stand: Min assist         General transfer comment: Assist to stabilize.   Ambulation/Gait Ambulation/Gait assistance: Min assist Ambulation Distance (Feet): 125 Feet Assistive device:  (hallway handrail) Gait Pattern/deviations: Step-through pattern;Decreased stride length;Staggering left;Staggering right;Drifts right/left     General Gait Details: unsteady with ambulation. Assist needed to prevent fall-possibly due to medication. Wife stated pt was medicated just prior to PT arrival. He tolerated distance well.   Stairs            Wheelchair Mobility    Modified Rankin (Stroke Patients Only)       Balance Overall balance assessment:  Needs assistance           Standing balance-Leahy Scale: Fair                               Pertinent Vitals/Pain Pain Assessment: Faces Faces Pain Scale: Hurts little more Pain Location: sides of abdomen Pain Descriptors / Indicators: Sore Pain Intervention(s): Premedicated before session    Home Living Family/patient expects to be discharged to:: Private residence Living Arrangements: Spouse/significant other Available Help at Discharge: Family;Available 24 hours/day Type of Home: House Home Access: Stairs to enter     Home Layout: One level Home Equipment: Environmental consultant - 2 wheels;Cane - single point;Wheelchair - manual      Prior Function Level of Independence: Independent               Hand Dominance        Extremity/Trunk Assessment        Lower Extremity Assessment Lower Extremity Assessment: Generalized weakness    Cervical / Trunk Assessment Cervical / Trunk Assessment: Normal  Communication   Communication: No difficulties  Cognition Arousal/Alertness: Awake/alert Behavior During Therapy: WFL for tasks assessed/performed Overall Cognitive Status: Within Functional Limits for tasks assessed                                        General Comments      Exercises     Assessment/Plan    PT Assessment Patient needs continued PT services  PT Problem  List Decreased strength;Decreased mobility;Decreased activity tolerance;Decreased balance;Decreased knowledge of use of DME;Decreased safety awareness       PT Treatment Interventions Gait training;DME instruction;Therapeutic activities;Therapeutic exercise;Patient/family education;Balance training;Functional mobility training    PT Goals (Current goals can be found in the Care Plan section)  Acute Rehab PT Goals Patient Stated Goal: home soon PT Goal Formulation: With patient/family Time For Goal Achievement: 07/28/16 Potential to Achieve Goals: Good    Frequency  Min 3X/week   Barriers to discharge        Co-evaluation               AM-PAC PT "6 Clicks" Daily Activity  Outcome Measure Difficulty turning over in bed (including adjusting bedclothes, sheets and blankets)?: A Little Difficulty moving from lying on back to sitting on the side of the bed? : A Little Difficulty sitting down on and standing up from a chair with arms (e.g., wheelchair, bedside commode, etc,.)?: A Little Help needed moving to and from a bed to chair (including a wheelchair)?: A Little Help needed walking in hospital room?: A Little Help needed climbing 3-5 steps with a railing? : A Little 6 Click Score: 18    End of Session Equipment Utilized During Treatment: Gait belt Activity Tolerance: Patient tolerated treatment well Patient left: with family/visitor present;with call bell/phone within reach (in bathroom with wife assisting)   PT Visit Diagnosis: Muscle weakness (generalized) (M62.81);Difficulty in walking, not elsewhere classified (R26.2)    Time: 1115-5208 PT Time Calculation (min) (ACUTE ONLY): 8 min   Charges:   PT Evaluation $PT Eval Low Complexity: 1 Procedure     PT G Codes:          Weston Anna, MPT Pager: [phone]

## 2016-07-15 DIAGNOSIS — D6481 Anemia due to antineoplastic chemotherapy: Secondary | ICD-10-CM

## 2016-07-15 DIAGNOSIS — T451X5A Adverse effect of antineoplastic and immunosuppressive drugs, initial encounter: Secondary | ICD-10-CM

## 2016-07-15 LAB — URINE CULTURE: Culture: NO GROWTH

## 2016-07-15 LAB — GLUCOSE, CAPILLARY: GLUCOSE-CAPILLARY: 207 mg/dL — AB (ref 65–99)

## 2016-07-15 LAB — TYPE AND SCREEN
ABO/RH(D): B POS
Antibody Screen: NEGATIVE
UNIT DIVISION: 0
Unit division: 0

## 2016-07-15 LAB — BASIC METABOLIC PANEL
ANION GAP: 9 (ref 5–15)
BUN: 22 mg/dL — ABNORMAL HIGH (ref 6–20)
CO2: 24 mmol/L (ref 22–32)
Calcium: 8.3 mg/dL — ABNORMAL LOW (ref 8.9–10.3)
Chloride: 99 mmol/L — ABNORMAL LOW (ref 101–111)
Creatinine, Ser: 1.09 mg/dL (ref 0.61–1.24)
GLUCOSE: 196 mg/dL — AB (ref 65–99)
POTASSIUM: 3.8 mmol/L (ref 3.5–5.1)
SODIUM: 132 mmol/L — AB (ref 135–145)

## 2016-07-15 LAB — CBC
HCT: 28.7 % — ABNORMAL LOW (ref 39.0–52.0)
Hemoglobin: 9.5 g/dL — ABNORMAL LOW (ref 13.0–17.0)
MCH: 30.1 pg (ref 26.0–34.0)
MCHC: 33.1 g/dL (ref 30.0–36.0)
MCV: 90.8 fL (ref 78.0–100.0)
PLATELETS: 301 10*3/uL (ref 150–400)
RBC: 3.16 MIL/uL — AB (ref 4.22–5.81)
RDW: 15.3 % (ref 11.5–15.5)
WBC: 17.1 10*3/uL — AB (ref 4.0–10.5)

## 2016-07-15 LAB — BPAM RBC
Blood Product Expiration Date: 201806182359
Blood Product Expiration Date: 201806182359
ISSUE DATE / TIME: 201805271822
ISSUE DATE / TIME: 201805281108
Unit Type and Rh: 7300
Unit Type and Rh: 7300

## 2016-07-15 LAB — HEMOGLOBIN A1C
HEMOGLOBIN A1C: 6 % — AB (ref 4.8–5.6)
MEAN PLASMA GLUCOSE: 126 mg/dL

## 2016-07-15 MED ORDER — HEPARIN SOD (PORK) LOCK FLUSH 100 UNIT/ML IV SOLN
500.0000 [IU] | Freq: Once | INTRAVENOUS | Status: AC
  Administered 2016-07-15: 500 [IU] via INTRAVENOUS
  Filled 2016-07-15: qty 5

## 2016-07-15 MED ORDER — LEVOFLOXACIN 500 MG PO TABS: 500.0000 mg | ORAL_TABLET | Freq: Every day | ORAL | 0 refills | Status: AC

## 2016-07-15 MED ORDER — ENSURE ENLIVE PO LIQD
237.0000 mL | Freq: Three times a day (TID) | ORAL | 12 refills | Status: AC
Start: 2016-07-15 — End: ?

## 2016-07-15 NOTE — Progress Notes (Signed)
D/C"d home with his caregiver. Both verbalized understanding to d/c instructions. Script given

## 2016-07-15 NOTE — Progress Notes (Signed)
Events in the last 48 hours noted. Patient appears to be doing well this morning. His appetite has improved and was eating breakfast this morning. He is a much improved and he is not reporting any shortness of breath or dyspnea on exertion. His hemoglobin appears to be stable.  I have no objections to discharge today he has follow-up on May 31 at 10 AM.

## 2016-07-15 NOTE — Discharge Summary (Signed)
Physician Discharge Summary  David [name] YOV:[id number] DOB: February 09, 1961 DOA: 07/13/2016  PCP: Carlena Hurl, PA-C  Admit date: 07/13/2016 Discharge date: 07/15/2016  Recommendations for Outpatient Follow-up:  1. 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 Hazeline Junker care), anemia, hypertension, dyslipidemia who presented to ED with generalized weakness for past 2-3 days prior to the admission. He also noticed dark stool. Hgb was 7.6 on admission. He was given 1 U PRBC transfusion.  Hospital Course:   Principal Problem:   Symptomatic anemia / Anemia of chronic disease - Likely due to malignancy - S/P 2 U PRBC transfusion during this hospital stay  - Hgb stable   Active Problems:   Hyperkalemia - Given kayexalate 30 gm - Repeat potassium WNL    UTI / Leukocytosis - Continue Levaquin for 7 days on discharge     RCC - Follows with Dr. Alen Blew   DVT prophylaxis: SCD's due to risk of bleeding  Code Status: full code  Family Communication: wife at bedside this am    Consultants:   None   Procedures:  None   Antimicrobials:   Levaquin 5/27 -->    Signed:  Leisa Lenz, MD  [facility] 07/15/2016, 9:34 AM  Pager #: [phone]  Time spent in minutes: less than 30 minutes    Discharge Exam: Vitals:   07/14/16 2050 07/15/16 0515  BP: 113/79 102/64  Pulse: 87 71  Resp: 16 16  Temp: 98.2 F (36.8 C) 97.6 F (36.4 C)   Vitals:   07/14/16 1329  07/14/16 1500 07/14/16 2050 07/15/16 0515  BP: 107/61 108/74 113/79 102/64  Pulse: 87 95 87 71  Resp: 14 18 16 16   Temp: 97.6 F (36.4 C) 98.2 F (36.8 C) 98.2 F (36.8 C) 97.6 F (36.4 C)  TempSrc: Oral Oral Oral Oral  SpO2: 100% 97% 98% 99%  Weight:      Height:        General: Pt is alert, follows commands appropriately, not in acute distress Cardiovascular: Regular rate and rhythm, S1/S2 +, no murmurs Respiratory: Clear to auscultation bilaterally, no wheezing, no crackles, no rhonchi Abdominal: Soft, non tender, non distended, bowel sounds +, no guarding Extremities: no edema, no cyanosis, pulses palpable bilaterally DP and PT Neuro: Grossly nonfocal  Discharge Instructions  Discharge Instructions    Call MD for:  persistant nausea and vomiting    Complete by:  As directed    Call MD for:  redness, tenderness, or signs of infection (pain, swelling, redness, odor or green/yellow discharge around incision site)    Complete by:  As directed    Call MD for:  severe uncontrolled pain    Complete by:  As directed    Diet - low sodium heart healthy    Complete by:  As directed    Increase activity slowly    Complete by:  As directed      Allergies as of 07/15/2016   No Known Allergies     Medication List  STOP taking these medications   doxycycline 50 MG capsule Commonly known as:  VIBRAMYCIN   metoCLOPramide 10 MG tablet Commonly known as:  REGLAN   mupirocin ointment 2 % Commonly known as:  BACTROBAN   tadalafil 20 MG tablet Commonly known as:  CIALIS     TAKE these medications   carvedilol 3.125 MG tablet Commonly known as:  COREG take 1 tablet by mouth twice a day with meals   chlorproMAZINE 50 MG tablet Commonly known as:  THORAZINE Take 1 tablet (50 mg total) by mouth 3 (three) times daily as needed for hiccoughs. What changed:  Another medication with the same name was removed. Continue taking this medication, and follow the directions you see  here.   clonazePAM 0.5 MG tablet Commonly known as:  KLONOPIN take 1 tablet by mouth 1 TO 2 TIMES DAILY AS NEEDED FOR ANXIETY.MAY CAUSE SEDATION   feeding supplement (ENSURE ENLIVE) Liqd Take 237 mLs by mouth 3 (three) times daily between meals.   levofloxacin 500 MG tablet Commonly known as:  LEVAQUIN Take 1 tablet (500 mg total) by mouth daily.   lidocaine-prilocaine cream Commonly known as:  EMLA Apply 1 application topically as needed.   megestrol 625 MG/5ML suspension Commonly known as:  MEGACE ES Take 5 mLs (625 mg total) by mouth daily.   metFORMIN 500 MG tablet Commonly known as:  GLUCOPHAGE Take 1 tablet (500 mg total) by mouth 2 (two) times daily with a meal.   morphine 30 MG 12 hr tablet Commonly known as:  MS CONTIN Take 1 tablet (30 mg total) by mouth every 12 (twelve) hours.   MULTI-VITAMIN GUMMIES PO Take 1 each by mouth daily.   nystatin 100000 UNIT/ML suspension Commonly known as:  MYCOSTATIN Use as directed 5 mLs (500,000 Units total) in the mouth or throat 4 (four) times daily.   oxyCODONE 5 MG immediate release tablet Commonly known as:  Oxy IR/ROXICODONE Take 1 tablet (5 mg total) by mouth every 4 (four) hours as needed for severe pain.   testosterone cypionate 200 MG/ML injection Commonly known as:  DEPOTESTOSTERONE CYPIONATE Inject 1 mL (200 mg total) into the muscle every 14 (fourteen) days.      Follow-up Information    Schedule an appointment as soon as possible for a visit with Tysinger, Camelia Eng, PA-C.   Specialty:  Family Medicine Contact information: [place] [address] [phone]        Wyatt Portela, MD. Call on 07/15/2016.   Specialty:  Oncology Contact information: [place] [address] [place] DEPT Follow up.   Specialty:  Emergency Medicine Why:  If symptoms worsen, worsening weakness, syncope Contact  information: [place] [id number] [place] [phone]           The results of significant diagnostics from this hospitalization (including imaging, microbiology, ancillary and laboratory) are listed below for reference.    Significant Diagnostic Studies: Dg Chest 2 View  Result Date: 07/13/2016 CLINICAL DATA:  Generalized weakness, history of transitional cell carcinoma EXAM: CHEST  2 VIEW COMPARISON:  06/18/2016 FINDINGS: Cardiac shadow is stable. Right-sided chest wall is within normal limits. The left lung is clear. Multiple masses are in again identified within the right hemithorax consistent with patient's known history of metastatic disease. These are stable in appearance. Small right-sided pleural effusion is noted. No bony abnormality is seen. IMPRESSION: Stable right-sided  metastatic disease similar to that seen on recent CT. No acute abnormality noted. Electronically Signed   By: Inez Catalina M.D.   On: 07/13/2016 15:25   Ct Chest W Contrast  Result Date: 06/19/2016 CLINICAL DATA:  Restaging of metastatic urothelial cancer. EXAM: CT CHEST, ABDOMEN, AND PELVIS WITH CONTRAST TECHNIQUE: Multidetector CT imaging of the chest, abdomen and pelvis was performed following the standard protocol during bolus administration of intravenous contrast. CONTRAST:  <See Chart> ISOVUE-300 IOPAMIDOL (ISOVUE-300) INJECTION 61% COMPARISON:  Multiple exams, including 03/03/2016 FINDINGS: CT CHEST FINDINGS Cardiovascular: Right Port-A-Cath tip: Right atrium. Mild cardiomegaly. Mediastinum/Nodes: Right anterior pericardial space lymph node 1.2 cm in short axis on image 41/2, previously not appreciated. Lungs/Pleura: Extensive new pleural metastatic disease on the right with pleural nodules and masses along the fissures and pleural surfaces. A conglomerate rind like mass medially along the right posterior costophrenic angle measures 2.4 cm in thickness on image 49/2.  An index pleural nodule along the right mediastinal border measures 1.1 cm in thickness on image 14/2. Numerous additional scattered and confluent pleural nodules are present on the right. Small right malignant pleural effusion. Bilateral mild mosaic attenuation in the lungs. Musculoskeletal: Several small sclerotic rib lesions are stable. CT ABDOMEN PELVIS FINDINGS Hepatobiliary: Contracted gallbladder with thickened wall. Small gallstone, image 66/2. The prior tiny arterial phase enhancing lesion anteriorly in the lateral segment left hepatic lobe is not well seen on today' s portal venous phase images through the liver. Pancreas: Unremarkable Spleen: Unremarkable Adrenals/Urinary Tract: Indistinct hypodense likely infiltrative mass along the left renal pelvis with some heterogeneous enhancement throughout much of the left kidney on the portal venous phase images. Difficult to measure due to the poor contrast between the mass in the rest of the enhancing kidney. The tubular contrast on the initial image series seems significantly reduced on the left compared to the right but I do not see definite renal vein thrombosis. No hydronephrosis. Overall the distribution of abnormal enhancement in the left kidney is fairly similar to 03/03/2016. Abnormal effacement of portions of the left collecting system. Mild left perirenal stranding. Small left kidney lower pole cyst. Adrenal glands unremarkable. Stomach/Bowel: Unremarkable Vascular/Lymphatic: Just above the distal transverse duodenum on image 69/2, there is a 3.1 by 2.2 cm structure of similar density to the surrounding non-opacified bowel but thought to probably represent an abnormally enlarged mesenteric lymph node. Adjacent mesenteric node measures 1.1 cm in short axis on image 72/2. Reproductive: Unremarkable Other: No supplemental non-categorized findings. Musculoskeletal: Lumbar spondylosis and degenerative disc disease causing impingement in the neural foramina  bilaterally at L5-S1. Oval-shaped sclerotic lesion in the left iliac wing 1.7 cm in long axis, stable. 0.9 cm sclerotic lesion along the iliac side of the right SI joint on image 95/2, stable. 1.5 cm sclerotic lesion in the right sacrum on image 102/2, stable. 5 mm sclerotic lesion anteriorly in the L1 vertebral body, stable. IMPRESSION: 1. Extensive progression of right pleural metastatic disease with numerous new nodules and masses along the pleura, and a small malignant pleural effusion. Abnormal enlarged right pericardial lymph node. 2. Similar appearance of infiltrative tumor along the left renal hilum, difficult to measure given the similar density to the surrounding renal parenchyma, with associated perirenal stranding. 3. Suspected mesenteric adenopathy just above the transverse duodenum, measuring up to 2.2 cm in long axis. 4. Stable sclerotic lesions particularly notable in the bony pelvis, probably from prior metastatic disease. 5. Impingement at L5-S1 due to spondylosis and degenerative disc disease. 6. Cholelithiasis  with contracted gallbladder. 7. Mild cardiomegaly. Electronically Signed   By: Van Clines M.D.   On: 06/19/2016 08:45   Ct Abdomen Pelvis W Contrast  Result Date: 06/19/2016 CLINICAL DATA:  Restaging of metastatic urothelial cancer. EXAM: CT CHEST, ABDOMEN, AND PELVIS WITH CONTRAST TECHNIQUE: Multidetector CT imaging of the chest, abdomen and pelvis was performed following the standard protocol during bolus administration of intravenous contrast. CONTRAST:  <See Chart> ISOVUE-300 IOPAMIDOL (ISOVUE-300) INJECTION 61% COMPARISON:  Multiple exams, including 03/03/2016 FINDINGS: CT CHEST FINDINGS Cardiovascular: Right Port-A-Cath tip: Right atrium. Mild cardiomegaly. Mediastinum/Nodes: Right anterior pericardial space lymph node 1.2 cm in short axis on image 41/2, previously not appreciated. Lungs/Pleura: Extensive new pleural metastatic disease on the right with pleural nodules and  masses along the fissures and pleural surfaces. A conglomerate rind like mass medially along the right posterior costophrenic angle measures 2.4 cm in thickness on image 49/2. An index pleural nodule along the right mediastinal border measures 1.1 cm in thickness on image 14/2. Numerous additional scattered and confluent pleural nodules are present on the right. Small right malignant pleural effusion. Bilateral mild mosaic attenuation in the lungs. Musculoskeletal: Several small sclerotic rib lesions are stable. CT ABDOMEN PELVIS FINDINGS Hepatobiliary: Contracted gallbladder with thickened wall. Small gallstone, image 66/2. The prior tiny arterial phase enhancing lesion anteriorly in the lateral segment left hepatic lobe is not well seen on today' s portal venous phase images through the liver. Pancreas: Unremarkable Spleen: Unremarkable Adrenals/Urinary Tract: Indistinct hypodense likely infiltrative mass along the left renal pelvis with some heterogeneous enhancement throughout much of the left kidney on the portal venous phase images. Difficult to measure due to the poor contrast between the mass in the rest of the enhancing kidney. The tubular contrast on the initial image series seems significantly reduced on the left compared to the right but I do not see definite renal vein thrombosis. No hydronephrosis. Overall the distribution of abnormal enhancement in the left kidney is fairly similar to 03/03/2016. Abnormal effacement of portions of the left collecting system. Mild left perirenal stranding. Small left kidney lower pole cyst. Adrenal glands unremarkable. Stomach/Bowel: Unremarkable Vascular/Lymphatic: Just above the distal transverse duodenum on image 69/2, there is a 3.1 by 2.2 cm structure of similar density to the surrounding non-opacified bowel but thought to probably represent an abnormally enlarged mesenteric lymph node. Adjacent mesenteric node measures 1.1 cm in short axis on image 72/2.  Reproductive: Unremarkable Other: No supplemental non-categorized findings. Musculoskeletal: Lumbar spondylosis and degenerative disc disease causing impingement in the neural foramina bilaterally at L5-S1. Oval-shaped sclerotic lesion in the left iliac wing 1.7 cm in long axis, stable. 0.9 cm sclerotic lesion along the iliac side of the right SI joint on image 95/2, stable. 1.5 cm sclerotic lesion in the right sacrum on image 102/2, stable. 5 mm sclerotic lesion anteriorly in the L1 vertebral body, stable. IMPRESSION: 1. Extensive progression of right pleural metastatic disease with numerous new nodules and masses along the pleura, and a small malignant pleural effusion. Abnormal enlarged right pericardial lymph node. 2. Similar appearance of infiltrative tumor along the left renal hilum, difficult to measure given the similar density to the surrounding renal parenchyma, with associated perirenal stranding. 3. Suspected mesenteric adenopathy just above the transverse duodenum, measuring up to 2.2 cm in long axis. 4. Stable sclerotic lesions particularly notable in the bony pelvis, probably from prior metastatic disease. 5. Impingement at L5-S1 due to spondylosis and degenerative disc disease. 6. Cholelithiasis with contracted gallbladder. 7. Mild cardiomegaly.  Electronically Signed   By: Van Clines M.D.   On: 06/19/2016 08:45    Microbiology: Recent Results (from the past 240 hour(s))  TECHNOLOGIST REVIEW     Status: None   Collection Time: 07/09/16  9:46 AM  Result Value Ref Range Status   Technologist Review Rare meta and myelocyte  Final     Labs: Basic Metabolic Panel:  Recent Labs Lab 07/09/16 0946 07/13/16 1242 07/13/16 1254 07/14/16 0357 07/14/16 1710 07/15/16 0448  NA 133* 132* 132* 132* 133* 132*  K 5.3* 5.1 5.0 5.6* 4.6 3.8  CL  --  101 99* 100* 97* 99*  CO2 21* 24  --  24 26 24   GLUCOSE 227* 219* 223* 174* 232* 196*  BUN 27.6* 22* 21* 23* 25* 22*  CREATININE 1.3 0.95  0.90 1.10 1.16 1.09  CALCIUM 10.7* 8.6*  --  8.5* 8.8* 8.3*   Liver Function Tests:  Recent Labs Lab 07/09/16 0946  AST 10  ALT 11  ALKPHOS 156*  BILITOT 1.31*  PROT 8.4*  ALBUMIN 2.3*   No results for input(s): LIPASE, AMYLASE in the last 168 hours. No results for input(s): AMMONIA in the last 168 hours. CBC:  Recent Labs Lab 07/09/16 0946  07/13/16 1242 07/13/16 1254 07/13/16 2256 07/14/16 0357 07/14/16 1710 07/15/16 0448  WBC 17.4*  --  19.0*  --   --  19.8* 19.4* 17.1*  NEUTROABS 13.1*  --  14.0*  --   --   --   --   --   HGB 9.8*  < > 7.7* 7.8* 8.6* 8.4* 10.5* 9.5*  HCT 31.0*  < > 24.3* 23.0* 26.7* 26.2* 32.2* 28.7*  MCV 92.0  --  90.7  --   --  90.3 91.7 90.8  PLT 319  --  326  --   --  317 330 301  < > = values in this interval not displayed. Cardiac Enzymes: No results for input(s): CKTOTAL, CKMB, CKMBINDEX, TROPONINI in the last 168 hours. BNP: BNP (last 3 results)  Recent Labs  10/24/15 1043 03/19/16 2120  BNP 57.9 53.9    ProBNP (last 3 results) No results for input(s): PROBNP in the last 8760 hours.  CBG:  Recent Labs Lab 07/14/16 0858 07/14/16 1152 07/14/16 1651 07/14/16 2006 07/15/16 0804  GLUCAP 232* 223* 206* 215* 207*

## 2016-07-15 NOTE — Discharge Instructions (Addendum)

## 2016-07-16 ENCOUNTER — Telehealth: Payer: Self-pay | Admitting: Medical

## 2016-07-16 NOTE — Telephone Encounter (Signed)
Pt was called for TOC/hospital follow up. Spoke to pt's wife. She states that he is doing a lot better. Current and discharge meds were reconciled. Pt has all meds at home and needs no refills. Pt's wife has a good understanding of what he is to take. Hospital follow appt was made for 07/30/2016. Attempted to make a appt for earlier and it was declined. She states she cant take any more time off and she gets out of school on 07/29/2016. She was aware of after hours protocol. She was informed to bring all medications with them to appt.

## 2016-07-17 ENCOUNTER — Ambulatory Visit (HOSPITAL_BASED_OUTPATIENT_CLINIC_OR_DEPARTMENT_OTHER): Payer: Medicaid Other

## 2016-07-17 ENCOUNTER — Other Ambulatory Visit (HOSPITAL_BASED_OUTPATIENT_CLINIC_OR_DEPARTMENT_OTHER): Payer: Medicaid Other

## 2016-07-17 ENCOUNTER — Ambulatory Visit (HOSPITAL_BASED_OUTPATIENT_CLINIC_OR_DEPARTMENT_OTHER): Payer: Medicaid Other | Admitting: Oncology

## 2016-07-17 ENCOUNTER — Telehealth: Payer: Self-pay | Admitting: Oncology

## 2016-07-17 VITALS — BP 117/69 | HR 110 | Temp 97.7°F | Resp 17 | Ht 69.0 in | Wt 185.1 lb

## 2016-07-17 DIAGNOSIS — D649 Anemia, unspecified: Secondary | ICD-10-CM

## 2016-07-17 DIAGNOSIS — C642 Malignant neoplasm of left kidney, except renal pelvis: Secondary | ICD-10-CM

## 2016-07-17 DIAGNOSIS — C659 Malignant neoplasm of unspecified renal pelvis: Secondary | ICD-10-CM

## 2016-07-17 DIAGNOSIS — C7800 Secondary malignant neoplasm of unspecified lung: Secondary | ICD-10-CM

## 2016-07-17 DIAGNOSIS — Z5112 Encounter for antineoplastic immunotherapy: Secondary | ICD-10-CM | POA: Diagnosis not present

## 2016-07-17 DIAGNOSIS — D6481 Anemia due to antineoplastic chemotherapy: Secondary | ICD-10-CM

## 2016-07-17 DIAGNOSIS — D630 Anemia in neoplastic disease: Secondary | ICD-10-CM | POA: Diagnosis not present

## 2016-07-17 LAB — COMPREHENSIVE METABOLIC PANEL
ALT: 11 U/L (ref 0–55)
ANION GAP: 9 meq/L (ref 3–11)
AST: 10 U/L (ref 5–34)
Albumin: 1.9 g/dL — ABNORMAL LOW (ref 3.5–5.0)
Alkaline Phosphatase: 191 U/L — ABNORMAL HIGH (ref 40–150)
BILIRUBIN TOTAL: 1.02 mg/dL (ref 0.20–1.20)
BUN: 15 mg/dL (ref 7.0–26.0)
CALCIUM: 9.5 mg/dL (ref 8.4–10.4)
CO2: 24 meq/L (ref 22–29)
CREATININE: 1.2 mg/dL (ref 0.7–1.3)
Chloride: 98 mEq/L (ref 98–109)
EGFR: 79 mL/min/{1.73_m2} — ABNORMAL LOW (ref >90)
Glucose: 344 mg/dl — ABNORMAL HIGH (ref 70–140)
Potassium: 4.6 mEq/L (ref 3.5–5.1)
Sodium: 131 mEq/L — ABNORMAL LOW (ref 136–145)
TOTAL PROTEIN: 7.2 g/dL (ref 6.4–8.3)

## 2016-07-17 LAB — CBC WITH DIFFERENTIAL/PLATELET
BASO%: 0.1 % (ref 0.0–2.0)
Basophils Absolute: 0 10*3/uL (ref 0.0–0.1)
EOS ABS: 0 10*3/uL (ref 0.0–0.5)
EOS%: 0.1 % (ref 0.0–7.0)
HEMATOCRIT: 30.5 % — AB (ref 38.4–49.9)
HGB: 9.6 g/dL — ABNORMAL LOW (ref 13.0–17.1)
LYMPH#: 1.9 10*3/uL (ref 0.9–3.3)
LYMPH%: 11.2 % — ABNORMAL LOW (ref 14.0–49.0)
MCH: 29.3 pg (ref 27.2–33.4)
MCHC: 31.5 g/dL — ABNORMAL LOW (ref 32.0–36.0)
MCV: 93 fL (ref 79.3–98.0)
MONO#: 1.8 10*3/uL — AB (ref 0.1–0.9)
MONO%: 11 % (ref 0.0–14.0)
NEUT%: 77.6 % — ABNORMAL HIGH (ref 39.0–75.0)
NEUTROS ABS: 13 10*3/uL — AB (ref 1.5–6.5)
PLATELETS: 281 10*3/uL (ref 140–400)
RBC: 3.28 10*6/uL — ABNORMAL LOW (ref 4.20–5.82)
RDW: 15.4 % — ABNORMAL HIGH (ref 11.0–14.6)
WBC: 16.7 10*3/uL — AB (ref 4.0–10.3)
nRBC: 0 % (ref 0–0)

## 2016-07-17 MED ORDER — SODIUM CHLORIDE 0.9 % IV SOLN
Freq: Once | INTRAVENOUS | Status: AC
  Administered 2016-07-17: 12:00:00 via INTRAVENOUS

## 2016-07-17 MED ORDER — PEMBROLIZUMAB CHEMO INJECTION 100 MG/4ML
200.0000 mg | Freq: Once | INTRAVENOUS | Status: AC
  Administered 2016-07-17: 200 mg via INTRAVENOUS
  Filled 2016-07-17: qty 8

## 2016-07-17 MED ORDER — SODIUM CHLORIDE 0.9% FLUSH
10.0000 mL | INTRAVENOUS | Status: DC | PRN
  Administered 2016-07-17: 10 mL
  Filled 2016-07-17: qty 10

## 2016-07-17 MED ORDER — HEPARIN SOD (PORK) LOCK FLUSH 100 UNIT/ML IV SOLN
500.0000 [IU] | Freq: Once | INTRAVENOUS | Status: AC | PRN
  Administered 2016-07-17: 500 [IU]
  Filled 2016-07-17: qty 5

## 2016-07-17 NOTE — Progress Notes (Signed)
Hematology and Oncology Follow Up Visit  David [name] [id number] Jul 05, 1960 56 y.o. 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 He presented with a 5 cm mass in the central left kidney as well as pulmonary metastasis, nodules and pleural effusion.   Prior Therapy:   Status post biopsy obtained on 10/12/2015 which confirmed the presence of carcinoma. 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  Chemotherapy with gemcitabine and carboplatin cycle one  on 10/30/2015. He is status post 8 cycles of therapy.   Current therapy: Pembrolizumab 200 mg every 3 weeks started on 06/26/2016. Cycle 2 will be received at today 07/17/2016.  Interim History: David [name] presents today for a follow-up visit. Since the last visit, he was hospitalized to briefly last week and received 2 units of packed red cell transfusion. Since his discharge, he has felt slightly better with slightly improved energy. His appetite has also improved and have gained some weight. He still fatigue and his mobility is limited. He denied any chest pain or shortness of breath. He denied any cough or hemoptysis.  He continues to have hiccups but decreased in frequency. He is using less of the Thorazine at this time. He continues to use breakthrough oxycodone and not using morphine at this time. Long-acting morphine has caused her to be too sleepy and discontinued it.  He denied any headaches, blurry vision, syncope or seizures. He does report fevers but no chills.He does not report any chest pain, palpitation, orthopnea, leg edema or dyspnea on exertion. He denied any cough, wheezing. He does not report any nausea, vomiting or early satiety. He did report constipation which have resolved. He denied any diarrhea. He does not report any frequency, urgency or hesitancy. He does not report any skeletal complaints.  Remaining review of system is unremarkable.   Medications: I have reviewed the patient's current medications.  Current Outpatient Prescriptions  Medication Sig Dispense Refill  . carvedilol (COREG) 3.125 MG tablet take 1 tablet by mouth twice a day with meals 180 tablet 0  . chlorproMAZINE (THORAZINE) 50 MG tablet Take 1 tablet (50 mg total) by mouth 3 (three) times daily as needed for hiccoughs. 90 tablet 0  . clonazePAM (KLONOPIN) 0.5 MG tablet take 1 tablet by mouth 1 TO 2 TIMES DAILY AS NEEDED FOR ANXIETY.MAY CAUSE SEDATION 30 tablet 0  . feeding supplement, ENSURE ENLIVE, (ENSURE ENLIVE) LIQD Take 237 mLs by mouth 3 (three) times daily between meals. 237 mL 12  . levofloxacin (LEVAQUIN) 500 MG tablet Take 1 tablet (500 mg total) by mouth daily. 7 tablet 0  . lidocaine-prilocaine (EMLA) cream Apply 1 application topically as needed. (Patient not taking: Reported on 07/13/2016) 30 g 2  . megestrol (MEGACE ES) 625 MG/5ML suspension Take 5 mLs (625 mg total) by mouth daily. 150 mL 0  . metFORMIN (GLUCOPHAGE) 500 MG tablet Take 1 tablet (500 mg total) by mouth 2 (two) times daily with a meal. 180 tablet 1  . morphine (MS CONTIN) 30 MG 12 hr tablet Take 1 tablet (30 mg total) by mouth every 12 (twelve) hours. 60 tablet 0  . Multiple Vitamins-Minerals (MULTI-VITAMIN GUMMIES PO) Take 1 each by mouth daily.     Marland Kitchen nystatin (MYCOSTATIN) 100000 UNIT/ML suspension Use as directed 5 mLs (500,000 Units total) in the mouth or throat 4 (four) times daily. 60 mL 0  . oxyCODONE (OXY IR/ROXICODONE) 5 MG immediate release  tablet Take 1 tablet (5 mg total) by mouth every 4 (four) hours as needed for severe pain. 60 tablet 0  . testosterone cypionate (DEPOTESTOSTERONE CYPIONATE) 200 MG/ML injection Inject 1 mL (200 mg total) into the muscle every 14 (fourteen) days. 10 mL 2   No current facility-administered medications for this visit.      Allergies: No Known Allergies  Past Medical History, Surgical history,  Social history, and Family History were reviewed and updated.   Physical Exam: Blood pressure 117/69, pulse (!) 110, temperature 97.7 F (36.5 C), temperature source Oral, resp. rate 17, height 5\' 9"  (1.753 m), weight 185 lb 1.6 oz (84 kg), SpO2 100 %. ECOG: 1 General appearance: Alert, awake gentleman chronically ill-appearing. Head: Normocephalic, without obvious abnormality no oral ulcers or lesions. Neck: no adenopathy Lymph nodes: Cervical, supraclavicular, and axillary nodes normal. Heart:regular rate and rhythm, S1, S2 normal, no murmur, click, rub or gallop Lung:chest clear, no wheezing, rales, normal symmetric air entry Abdomin: soft, non-tender, without masses or organomegaly no shifting dullness or ascites. EXT:No edema noted in his extremities.   Lab Results: Lab Results  Component Value Date   WBC 16.7 (H) 07/17/2016   HGB 9.6 (L) 07/17/2016   HCT 30.5 (L) 07/17/2016   MCV 93.0 07/17/2016   PLT 281 07/17/2016     Chemistry      Component Value Date/Time   NA 132 (L) 07/15/2016 0448   NA 133 (L) 07/09/2016 0946   K 3.8 07/15/2016 0448   K 5.3 (H) 07/09/2016 0946   CL 99 (L) 07/15/2016 0448   CO2 24 07/15/2016 0448   CO2 21 (L) 07/09/2016 0946   BUN 22 (H) 07/15/2016 0448   BUN 27.6 (H) 07/09/2016 0946   CREATININE 1.09 07/15/2016 0448   CREATININE 1.3 07/09/2016 0946      Component Value Date/Time   CALCIUM 8.3 (L) 07/15/2016 0448   CALCIUM 10.7 (H) 07/09/2016 0946   ALKPHOS 156 (H) 07/09/2016 0946   AST 10 07/09/2016 0946   ALT 11 07/09/2016 0946   BILITOT 1.31 (H) 07/09/2016 0946      Impression and Plan:  56 year old gentleman with the following issues:  1. 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 He is status post biopsy of a pleural-based nodule which confirmed the presence of carcinoma although further immunohistochemical staining was not performed because of lack of  tissue.  These findings confirm the presence of advanced malignancy Arising from the renal pelvis.  He is currently receiving salvage chemotherapy with carboplatin and gemcitabine. Chemotherapy has been well tolerated with dramatic improvement in his quality of life.  CT scan on 11/09/2017showed a positive response with decrease of the volume of disease at this time.  CT scan obtained on 03/03/2016 continues to show excellent response to therapy.   He completed 8 cycles of therapy with cycle 8 of chemotherapy was given with gemcitabine only.   CT scan obtained on 06/18/2016 showed progression of disease. He is developing pulmonary based metastasis with parenchymal and pleural based nodules. He is also symptomatic from his disease and salvage therapy is required.  He is currently on Pembrolizumab and have tolerated therapy without major complications.   The plan is to proceed with cycle 2 today without any dose reduction or delay. He will receive cycle 3 in 3 weeks and CT scan will be scheduled afterwards. Different salvage therapy will be utilized if he develops progression of disease.  2. IV access: Port-A-Cath will be used for therapy moving forward.  3. Nausea prophylaxis: No issues reported at this time. Antiemetics available to him at this time.  4. Anemia: Related to malignancy and chemotherapy infusion. Hemoglobin is adequate today without any transfusion. We will continue to monitor his counts weekly and transfuse as needed.  5. Hypercalcemia: Related to malignancy and we'll arrange for Zometa infusion with his next treatment.  6. Goals of care: These were reviewed again with the patient and he understand he has an incurable malignancy. His performance status remains adequate at this time to receive aggressive therapy.  7. Pain: He continues to use when necessary oxycodone without any long-acting medication for the time being.  8. Follow-up: Weekly to check his blood counts  and in 3 weeks for the next cycle of therapy.  OVFIEP,PIRJJ, MD 5/31/201810:18 AM

## 2016-07-17 NOTE — Patient Instructions (Signed)
[facility] Discharge Instructions for Patients Receiving Chemotherapy  Today you received the following chemotherapy agents: Keytruda   To help prevent nausea and vomiting after your treatment, we encourage you to take your nausea medication as prescribed by your physician.   If you develop nausea and vomiting that is not controlled by your nausea medication, call the clinic.   BELOW ARE SYMPTOMS THAT SHOULD BE REPORTED IMMEDIATELY:  *FEVER GREATER THAN 100.5 F  *CHILLS WITH OR WITHOUT FEVER  NAUSEA AND VOMITING THAT IS NOT CONTROLLED WITH YOUR NAUSEA MEDICATION  *UNUSUAL SHORTNESS OF BREATH  *UNUSUAL BRUISING OR BLEEDING  TENDERNESS IN MOUTH AND THROAT WITH OR WITHOUT PRESENCE OF ULCERS  *URINARY PROBLEMS  *BOWEL PROBLEMS  UNUSUAL RASH Items with * indicate a potential emergency and should be followed up as soon as possible.  Feel free to call the clinic you have any questions or concerns. The clinic phone number is [phone].  Please show the CHEMO ALERT CARD at check-in to the Emergency Department and triage nurse.   

## 2016-07-17 NOTE — Telephone Encounter (Signed)
Does he need f/u with hematology or me.   He was recently seen for anemia related to his cancer and therapy.   He may not need f/u with meed.  Did he say?

## 2016-07-17 NOTE — Telephone Encounter (Signed)
Gave patient AVS and calender per 5/31 LOS.

## 2016-07-21 ENCOUNTER — Ambulatory Visit: Payer: Medicaid Other

## 2016-07-23 NOTE — Telephone Encounter (Signed)
APPT cancelled

## 2016-07-23 NOTE — Telephone Encounter (Signed)
If he is having some symptoms or concerns currently we are happy to see him, but I know he also just saw oncology on the 30th I believe.  I know they are doing weekly blood counts through oncology, so he is getting surveillance on this.

## 2016-07-23 NOTE — Telephone Encounter (Signed)
Spoke to wife and it was her understanding that he was to follow up with both. The AVS stated that same.

## 2016-07-24 ENCOUNTER — Other Ambulatory Visit (HOSPITAL_BASED_OUTPATIENT_CLINIC_OR_DEPARTMENT_OTHER): Payer: Medicaid Other

## 2016-07-24 ENCOUNTER — Encounter: Payer: Self-pay | Admitting: *Deleted

## 2016-07-24 DIAGNOSIS — D649 Anemia, unspecified: Secondary | ICD-10-CM

## 2016-07-24 DIAGNOSIS — C642 Malignant neoplasm of left kidney, except renal pelvis: Secondary | ICD-10-CM

## 2016-07-24 DIAGNOSIS — C659 Malignant neoplasm of unspecified renal pelvis: Secondary | ICD-10-CM

## 2016-07-24 LAB — CBC WITH DIFFERENTIAL/PLATELET
BASO%: 0.5 % (ref 0.0–2.0)
BASOS ABS: 0.1 10*3/uL (ref 0.0–0.1)
EOS%: 0.2 % (ref 0.0–7.0)
Eosinophils Absolute: 0 10*3/uL (ref 0.0–0.5)
HEMATOCRIT: 27.1 % — AB (ref 38.4–49.9)
HGB: 8.9 g/dL — ABNORMAL LOW (ref 13.0–17.1)
LYMPH#: 1.6 10*3/uL (ref 0.9–3.3)
LYMPH%: 9.9 % — ABNORMAL LOW (ref 14.0–49.0)
MCH: 30.1 pg (ref 27.2–33.4)
MCHC: 32.7 g/dL (ref 32.0–36.0)
MCV: 92 fL (ref 79.3–98.0)
MONO#: 1.8 10*3/uL — ABNORMAL HIGH (ref 0.1–0.9)
MONO%: 10.8 % (ref 0.0–14.0)
NEUT#: 12.8 10*3/uL — ABNORMAL HIGH (ref 1.5–6.5)
NEUT%: 78.6 % — AB (ref 39.0–75.0)
Platelets: 354 10*3/uL (ref 140–400)
RBC: 2.95 10*6/uL — AB (ref 4.20–5.82)
RDW: 15.5 % — ABNORMAL HIGH (ref 11.0–14.6)
WBC: 16.3 10*3/uL — ABNORMAL HIGH (ref 4.0–10.3)

## 2016-07-24 LAB — COMPREHENSIVE METABOLIC PANEL
ALT: 9 U/L (ref 0–55)
AST: 8 U/L (ref 5–34)
Albumin: 1.8 g/dL — ABNORMAL LOW (ref 3.5–5.0)
Alkaline Phosphatase: 135 U/L (ref 40–150)
Anion Gap: 9 mEq/L (ref 3–11)
BUN: 13.4 mg/dL (ref 7.0–26.0)
CALCIUM: 9.3 mg/dL (ref 8.4–10.4)
CO2: 25 mEq/L (ref 22–29)
Chloride: 97 mEq/L — ABNORMAL LOW (ref 98–109)
Creatinine: 1 mg/dL (ref 0.7–1.3)
EGFR: >90 mL/min/{1.73_m2} (ref >90)
Glucose: 358 mg/dl — ABNORMAL HIGH (ref 70–140)
POTASSIUM: 5 meq/L (ref 3.5–5.1)
Sodium: 131 mEq/L — ABNORMAL LOW (ref 136–145)
Total Bilirubin: 0.77 mg/dL (ref 0.20–1.20)
Total Protein: 7 g/dL (ref 6.4–8.3)

## 2016-07-25 ENCOUNTER — Other Ambulatory Visit: Payer: Self-pay | Admitting: Oncology

## 2016-07-28 ENCOUNTER — Other Ambulatory Visit: Payer: Self-pay | Admitting: Oncology

## 2016-07-28 ENCOUNTER — Telehealth: Payer: Self-pay | Admitting: *Deleted

## 2016-07-28 DIAGNOSIS — C659 Malignant neoplasm of unspecified renal pelvis: Secondary | ICD-10-CM

## 2016-07-28 NOTE — Telephone Encounter (Signed)
"  I'm calling for my husband who I believe needs blood this week.  HGB = 8.6 last week.  He's been weak, not eating all weekend so I'm pretty sure he'll need blood when he comes in for lab this week.  Can Dr. Alen Blew set this up, [phone]."

## 2016-07-29 ENCOUNTER — Other Ambulatory Visit: Payer: Self-pay | Admitting: *Deleted

## 2016-07-29 ENCOUNTER — Other Ambulatory Visit: Payer: Self-pay | Admitting: Oncology

## 2016-07-29 ENCOUNTER — Ambulatory Visit (HOSPITAL_COMMUNITY)
Admission: RE | Admit: 2016-07-29 | Discharge: 2016-07-29 | Disposition: A | Discharge status: Home / Self Care | Payer: Medicaid Other | Source: Ambulatory Visit | Attending: Oncology | Admitting: Oncology

## 2016-07-29 DIAGNOSIS — C659 Malignant neoplasm of unspecified renal pelvis: Secondary | ICD-10-CM

## 2016-07-29 MED ORDER — CLONAZEPAM 0.5 MG PO TABS: ORAL_TABLET | ORAL | 0 refills | Status: DC

## 2016-07-29 MED ORDER — OXYCODONE HCL 5 MG PO TABS: 5.0000 mg | ORAL_TABLET | ORAL | 0 refills | Status: DC | PRN

## 2016-07-29 NOTE — Telephone Encounter (Signed)
Wife called that he needs an appt tomorrow to receive blood. He is very tired and weak. He is waiting for tomorrow at [facility] rather than going to ER  She asked for oxycodone refill. He is using about 4/day total.   She mentioned morphine makes him confused and asked that it be taken off his MAR. He had MS contin on MAR. This was recorded in allergy section of chart.  He does not have a CT scan yet, she is expecting it before the 7/3 appt to see if tx is helping. Explained that Dr Alen Blew will order it when he sees pt on 6/21 for cycle 3.  And she will pick up contrast at that time.

## 2016-07-29 NOTE — Telephone Encounter (Signed)
Please refill his oxycodone and move his lab appointment to 6/13.

## 2016-07-29 NOTE — Telephone Encounter (Signed)
This RN called and spoke to wife regarding blood transfusion. Instructed her to come tomorrow, 6/13 @ 8 am for labs, and blood transfusion at 9:00 am at [facility]. Also, there are two prescriptions to pick up. Wife verbalized understanding.

## 2016-07-30 ENCOUNTER — Other Ambulatory Visit (INDEPENDENT_AMBULATORY_CARE_PROVIDER_SITE_OTHER): Payer: Medicaid Other

## 2016-07-30 ENCOUNTER — Other Ambulatory Visit (HOSPITAL_BASED_OUTPATIENT_CLINIC_OR_DEPARTMENT_OTHER): Payer: Medicaid Other

## 2016-07-30 ENCOUNTER — Inpatient Hospital Stay: Payer: Medicaid Other | Admitting: Medical

## 2016-07-30 ENCOUNTER — Ambulatory Visit (HOSPITAL_COMMUNITY)
Admission: RE | Admit: 2016-07-30 | Discharge: 2016-07-30 | Disposition: A | Discharge status: Home / Self Care | Payer: Medicaid Other | Source: Ambulatory Visit | Attending: Oncology | Admitting: Oncology

## 2016-07-30 DIAGNOSIS — C642 Malignant neoplasm of left kidney, except renal pelvis: Secondary | ICD-10-CM

## 2016-07-30 DIAGNOSIS — C659 Malignant neoplasm of unspecified renal pelvis: Secondary | ICD-10-CM

## 2016-07-30 DIAGNOSIS — E291 Testicular hypofunction: Secondary | ICD-10-CM

## 2016-07-30 DIAGNOSIS — D649 Anemia, unspecified: Secondary | ICD-10-CM

## 2016-07-30 LAB — CBC WITH DIFFERENTIAL/PLATELET
BASO%: 0.2 % (ref 0.0–2.0)
BASOS ABS: 0 10*3/uL (ref 0.0–0.1)
EOS ABS: 0 10*3/uL (ref 0.0–0.5)
EOS%: 0.1 % (ref 0.0–7.0)
HEMATOCRIT: 27.4 % — AB (ref 38.4–49.9)
HEMOGLOBIN: 8.5 g/dL — AB (ref 13.0–17.1)
LYMPH%: 12.9 % — ABNORMAL LOW (ref 14.0–49.0)
MCH: 28.9 pg (ref 27.2–33.4)
MCHC: 31 g/dL — ABNORMAL LOW (ref 32.0–36.0)
MCV: 93.2 fL (ref 79.3–98.0)
MONO#: 2.2 10*3/uL — AB (ref 0.1–0.9)
MONO%: 11.4 % (ref 0.0–14.0)
NEUT#: 14.8 10*3/uL — ABNORMAL HIGH (ref 1.5–6.5)
NEUT%: 75.4 % — AB (ref 39.0–75.0)
Platelets: 321 10*3/uL (ref 140–400)
RBC: 2.94 10*6/uL — ABNORMAL LOW (ref 4.20–5.82)
RDW: 15.1 % — ABNORMAL HIGH (ref 11.0–14.6)
WBC: 19.6 10*3/uL — ABNORMAL HIGH (ref 4.0–10.3)
lymph#: 2.5 10*3/uL (ref 0.9–3.3)
nRBC: 0 % (ref 0–0)

## 2016-07-30 LAB — COMPREHENSIVE METABOLIC PANEL
ALBUMIN: 1.8 g/dL — AB (ref 3.5–5.0)
ALK PHOS: 130 U/L (ref 40–150)
ALT: 9 U/L (ref 0–55)
AST: 8 U/L (ref 5–34)
Anion Gap: 11 mEq/L (ref 3–11)
BILIRUBIN TOTAL: 0.98 mg/dL (ref 0.20–1.20)
BUN: 22.5 mg/dL (ref 7.0–26.0)
CALCIUM: 9.8 mg/dL (ref 8.4–10.4)
CO2: 23 mEq/L (ref 22–29)
Chloride: 101 mEq/L (ref 98–109)
Creatinine: 1.2 mg/dL (ref 0.7–1.3)
EGFR: 81 mL/min/{1.73_m2} — AB (ref >90)
Glucose: 312 mg/dl — ABNORMAL HIGH (ref 70–140)
Sodium: 135 mEq/L — ABNORMAL LOW (ref 136–145)
TOTAL PROTEIN: 7.2 g/dL (ref 6.4–8.3)

## 2016-07-30 LAB — PREPARE RBC (CROSSMATCH)

## 2016-07-30 LAB — TECHNOLOGIST REVIEW

## 2016-07-30 MED ORDER — SODIUM CHLORIDE 0.9% FLUSH
10.0000 mL | INTRAVENOUS | Status: AC | PRN
  Administered 2016-07-30: 10 mL

## 2016-07-30 MED ORDER — ACETAMINOPHEN 325 MG PO TABS
650.0000 mg | ORAL_TABLET | Freq: Once | ORAL | Status: AC
  Administered 2016-07-30: 650 mg via ORAL
  Filled 2016-07-30: qty 2

## 2016-07-30 MED ORDER — SODIUM CHLORIDE 0.9 % IV SOLN
250.0000 mL | Freq: Once | INTRAVENOUS | Status: AC
  Administered 2016-07-30: 250 mL via INTRAVENOUS

## 2016-07-30 MED ORDER — TESTOSTERONE CYPIONATE 200 MG/ML IM SOLN
200.0000 mg | Freq: Once | INTRAMUSCULAR | Status: AC
  Administered 2016-07-30: 200 mg via INTRAMUSCULAR

## 2016-07-30 MED ORDER — DIPHENHYDRAMINE HCL 25 MG PO CAPS
25.0000 mg | ORAL_CAPSULE | Freq: Once | ORAL | Status: AC
  Administered 2016-07-30: 25 mg via ORAL
  Filled 2016-07-30: qty 1

## 2016-07-30 MED ORDER — HEPARIN SOD (PORK) LOCK FLUSH 100 UNIT/ML IV SOLN
500.0000 [IU] | Freq: Every day | INTRAVENOUS | Status: AC | PRN
  Administered 2016-07-30: 500 [IU]
  Filled 2016-07-30: qty 5

## 2016-07-30 NOTE — Discharge Instructions (Signed)

## 2016-07-30 NOTE — Progress Notes (Signed)
Ordering Provider: Dahlia Byes MD  Associated Diagnosis: Primary malignant neoplasm of renal pelvis, unspecified laterality ([place]) (C65.9)  Procedure Note:  Transfusion of 2 units PRBCs   Condition During Procedure: Pt tolerated well; no complications noted   Condition at Discharge: Pt alert, oriented, ambulatory; accompanied by family

## 2016-07-31 ENCOUNTER — Other Ambulatory Visit: Payer: Medicaid Other

## 2016-07-31 LAB — BPAM RBC
BLOOD PRODUCT EXPIRATION DATE: 201807042359
Blood Product Expiration Date: 201807032359
ISSUE DATE / TIME: 201806130900
ISSUE DATE / TIME: 201806130900
UNIT TYPE AND RH: 7300
Unit Type and Rh: 7300

## 2016-07-31 LAB — TYPE AND SCREEN
ABO/RH(D): B POS
Antibody Screen: NEGATIVE
Unit division: 0
Unit division: 0

## 2016-08-07 ENCOUNTER — Telehealth: Payer: Self-pay

## 2016-08-07 ENCOUNTER — Ambulatory Visit: Payer: Medicaid Other

## 2016-08-07 ENCOUNTER — Encounter: Payer: Medicaid Other | Admitting: Nutrition

## 2016-08-07 ENCOUNTER — Telehealth: Payer: Self-pay | Admitting: *Deleted

## 2016-08-07 ENCOUNTER — Ambulatory Visit (HOSPITAL_BASED_OUTPATIENT_CLINIC_OR_DEPARTMENT_OTHER): Payer: Medicaid Other | Admitting: Oncology

## 2016-08-07 ENCOUNTER — Other Ambulatory Visit (HOSPITAL_BASED_OUTPATIENT_CLINIC_OR_DEPARTMENT_OTHER): Payer: Medicaid Other

## 2016-08-07 VITALS — BP 105/72 | HR 104 | Temp 98.1°F | Resp 22 | Ht 69.0 in | Wt 183.9 lb

## 2016-08-07 DIAGNOSIS — C642 Malignant neoplasm of left kidney, except renal pelvis: Secondary | ICD-10-CM | POA: Diagnosis not present

## 2016-08-07 DIAGNOSIS — D649 Anemia, unspecified: Secondary | ICD-10-CM

## 2016-08-07 DIAGNOSIS — Z79899 Other long term (current) drug therapy: Secondary | ICD-10-CM

## 2016-08-07 DIAGNOSIS — C7800 Secondary malignant neoplasm of unspecified lung: Secondary | ICD-10-CM | POA: Diagnosis not present

## 2016-08-07 DIAGNOSIS — D630 Anemia in neoplastic disease: Secondary | ICD-10-CM

## 2016-08-07 DIAGNOSIS — C659 Malignant neoplasm of unspecified renal pelvis: Secondary | ICD-10-CM

## 2016-08-07 LAB — COMPREHENSIVE METABOLIC PANEL
ALBUMIN: 1.5 g/dL — AB (ref 3.5–5.0)
ALK PHOS: 109 U/L (ref 40–150)
ALT: 7 U/L (ref 0–55)
ANION GAP: 9 meq/L (ref 3–11)
AST: 8 U/L (ref 5–34)
BUN: 15.5 mg/dL (ref 7.0–26.0)
CHLORIDE: 106 meq/L (ref 98–109)
CO2: 22 mEq/L (ref 22–29)
CREATININE: 0.8 mg/dL (ref 0.7–1.3)
Calcium: 8.6 mg/dL (ref 8.4–10.4)
EGFR: >90 mL/min/{1.73_m2} (ref >90)
Glucose: 125 mg/dl (ref 70–140)
POTASSIUM: 4 meq/L (ref 3.5–5.1)
Sodium: 136 mEq/L (ref 136–145)
Total Bilirubin: 0.73 mg/dL (ref 0.20–1.20)
Total Protein: 6.2 g/dL — ABNORMAL LOW (ref 6.4–8.3)

## 2016-08-07 LAB — CBC WITH DIFFERENTIAL/PLATELET
BASO%: 0.4 % (ref 0.0–2.0)
BASOS ABS: 0.1 10*3/uL (ref 0.0–0.1)
EOS ABS: 0 10*3/uL (ref 0.0–0.5)
EOS%: 0.1 % (ref 0.0–7.0)
HEMATOCRIT: 28.8 % — AB (ref 38.4–49.9)
HEMOGLOBIN: 9.2 g/dL — AB (ref 13.0–17.1)
LYMPH#: 2.5 10*3/uL (ref 0.9–3.3)
LYMPH%: 9.9 % — ABNORMAL LOW (ref 14.0–49.0)
MCH: 29 pg (ref 27.2–33.4)
MCHC: 31.8 g/dL — ABNORMAL LOW (ref 32.0–36.0)
MCV: 91.1 fL (ref 79.3–98.0)
MONO#: 2.8 10*3/uL — AB (ref 0.1–0.9)
MONO%: 11.2 % (ref 0.0–14.0)
NEUT#: 19.4 10*3/uL — ABNORMAL HIGH (ref 1.5–6.5)
NEUT%: 78.4 % — ABNORMAL HIGH (ref 39.0–75.0)
PLATELETS: 284 10*3/uL (ref 140–400)
RBC: 3.16 10*6/uL — ABNORMAL LOW (ref 4.20–5.82)
RDW: 15.3 % — AB (ref 11.0–14.6)
WBC: 24.7 10*3/uL — ABNORMAL HIGH (ref 4.0–10.3)

## 2016-08-07 LAB — TSH: TSH: 0.895 m[IU]/L (ref 0.320–4.118)

## 2016-08-07 LAB — TECHNOLOGIST REVIEW

## 2016-08-07 NOTE — Telephone Encounter (Signed)
Unable to return patient's wife's call. Re: increasing pain medication. Home  Mail box full . Lm on wife's cell phone.

## 2016-08-07 NOTE — Telephone Encounter (Signed)
Spoke with pts wife and she is aware of the 7/17 appt.  She did state that they would keep the 6/28 lab appt and will talk about they need to keep 7/3 or not Selenne Coggin

## 2016-08-07 NOTE — Progress Notes (Signed)
Hematology and Oncology Follow Up Visit  David [name] [id number] [birth year] 56 y.o. 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 He presented with a 5 cm mass in the central left kidney as well as pulmonary metastasis, nodules and pleural effusion.   Prior Therapy:   Status post biopsy obtained on 10/12/2015 which confirmed the presence of carcinoma. 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  Chemotherapy with gemcitabine and carboplatin cycle one  on 10/30/2015. He is status post 8 cycles of therapy.   Current therapy: Pembrolizumab 200 mg every 3 weeks started on 06/26/2016. He is status post 2 cycles of therapy.  Interim History: David [name] presents today for a follow-up visit. Since the last visit, he was hospitalized at [facility] for dehydration and hyperglycemia. He was started on insulin and received aggressive hydration with improvement in his overall symptoms. He did not require any packed red cell transfusions.  Since his discharge, he continues to do poorly. He continues to have poor by mouth intake and limited mobility. His performance status continues to decline rather rapidly. He does not report any chest pain or difficulty breathing. He does not report any cough or abdominal distention. He does not report any neurological symptoms.  He denied any headaches, blurry vision, syncope or seizures. He does report fevers but no chills.He does not report any chest pain, palpitation, orthopnea, leg edema or dyspnea on exertion. He denied any cough, wheezing. He does not report any nausea, vomiting or early satiety. He did report constipation which have resolved. He denied any diarrhea. He does not report any frequency, urgency or hesitancy. He does not report any skeletal complaints. Remaining review of system is unremarkable.    Medications: I have reviewed the patient's current medications.  Current Outpatient Prescriptions  Medication Sig Dispense Refill  . carvedilol (COREG) 3.125 MG tablet take 1 tablet by mouth twice a day with meals 180 tablet 0  . chlorproMAZINE (THORAZINE) 50 MG tablet Take 1 tablet (50 mg total) by mouth 3 (three) times daily as needed for hiccoughs. 90 tablet 0  . clonazePAM (KLONOPIN) 0.5 MG tablet take 1 tablet by mouth 1 TO 2 TIMES A DAY if needed for anxiety (MAY CAUSE SEDATION) 30 tablet 0  . feeding supplement, ENSURE ENLIVE, (ENSURE ENLIVE) LIQD Take 237 mLs by mouth 3 (three) times daily between meals. 237 mL 12  . glucose 4 GM chewable tablet Chew by mouth.    . insulin aspart (NOVOLOG) 100 UNIT/ML FlexPen Inject into the skin.    Marland Kitchen insulin glargine (LANTUS) 100 UNIT/ML injection Inject into the skin.    Marland Kitchen lidocaine-prilocaine (EMLA) cream Apply 1 application topically as needed. 30 g 2  . megestrol (MEGACE ES) 625 MG/5ML suspension take 5 milliliters by mouth daily 150 mL 0  . metFORMIN (GLUCOPHAGE) 500 MG tablet Take 1 tablet (500 mg total) by mouth 2 (two) times daily with a meal. 180 tablet 1  . mirtazapine (REMERON) 15 MG tablet Take by mouth.    . Multiple Vitamins-Minerals (MULTI-VITAMIN GUMMIES PO) Take 1 each by mouth daily.     Marland Kitchen nystatin (MYCOSTATIN) 100000 UNIT/ML suspension Use as directed 5 mLs (500,000 Units total) in the mouth or throat 4 (four) times daily. 60 mL 0  . oxyCODONE (OXY IR/ROXICODONE) 5 MG immediate release tablet Take 1 tablet (5 mg total) by mouth every 4 (four) hours as needed  for severe pain. 60 tablet 0  . testosterone cypionate (DEPOTESTOSTERONE CYPIONATE) 200 MG/ML injection Inject 1 mL (200 mg total) into the muscle every 14 (fourteen) days. 10 mL 2  . omeprazole (PRILOSEC) 20 MG capsule Take by mouth.    . vitamin B-12 (CYANOCOBALAMIN) 1000 MCG tablet Take by mouth.     No current facility-administered medications for this visit.       Allergies:  Allergies  Allergen Reactions  . Morphine And Related Other (See Comments)    Confusion and hallucinating    Past Medical History, Surgical history, Social history, and Family History were reviewed and updated.   Physical Exam: Blood pressure 105/72, pulse (!) 104, temperature 98.1 F (36.7 C), temperature source Oral, resp. rate (!) 22, height 5\' 9"  (1.753 m), weight 183 lb 14.4 oz (83.4 kg), SpO2 100 %. ECOG: 2 General appearance: Chronically ill-appearing gentleman appeared without distress today. Head: Normocephalic, without obvious abnormality no oral ulcers or lesions. Neck: no adenopathy Lymph nodes: Cervical, supraclavicular, and axillary nodes normal. Heart:regular rate and rhythm, S1, S2 normal, no murmur, click, rub or gallop Lung:chest clear, no wheezing, rales, normal symmetric air entry Abdomin: soft, non-tender, without masses or organomegaly no shifting dullness or ascites. EXT:No edema noted in his extremities.   Lab Results: Lab Results  Component Value Date   WBC 24.7 (H) 08/07/2016   HGB 9.2 (L) 08/07/2016   HCT 28.8 (L) 08/07/2016   MCV 91.1 08/07/2016   PLT 284 08/07/2016     Chemistry      Component Value Date/Time   NA 136 08/07/2016 0920   K 4.0 08/07/2016 0920   CL 99 (L) 07/15/2016 0448   CO2 22 08/07/2016 0920   BUN 15.5 08/07/2016 0920   CREATININE 0.8 08/07/2016 0920      Component Value Date/Time   CALCIUM 8.6 08/07/2016 0920   ALKPHOS 109 08/07/2016 0920   AST 8 08/07/2016 0920   ALT 7 08/07/2016 0920   BILITOT 0.73 08/07/2016 0920      Impression and Plan:  56 year old gentleman with the following issues:  1. 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 He is status post biopsy of a pleural-based nodule which confirmed the presence of carcinoma although further immunohistochemical staining was not performed because of lack of tissue.  These  findings confirm the presence of advanced malignancy arising from the renal pelvis.  He is S/Psalvage chemotherapy with carboplatin and gemcitabine. Chemotherapy has been well tolerated with dramatic improvement in his quality of life.  CT scan on 11/09/2017showed a positive response with decrease of the volume of disease at this time.  CT scan obtained on 03/03/2016 continues to show excellent response to therapy.   He completed 8 cycles of therapy with cycle 8 of chemotherapy was given with gemcitabine only.   CT scan obtained on 06/18/2016 showed progression of disease. He is developing pulmonary based metastasis with parenchymal and pleural based nodules. He is also symptomatic from his disease and salvage therapy is required.  He is currently on Pembrolizumab and completed 2 cycles of therapy. This treatment has been fairly well tolerated although his clinical status continues to decline. He was hospitalized because of hyperglycemia and dehydration. He is currently on insulin after that.  Options of treatment were reviewed today with the patient and his wife. One option is to continue Pembrolizumab but given his decline in his overall health status it is possible that his  cancer is progressing rather rapidly and immunotherapy might not be his best option. The second option would be to switch back to systemic chemotherapy will be in the form of Taxotere or back to platinum agents. The third option would be hospice and supportive care only. Given his rapid decline in his overall performance status and likely progression of disease, I favor proceeding with hospice at this time.  After discussion today, he elected to defer continue on Pembrolizumab and would like to pursue second opinion at [facility]. He has a appointment on 08/22/2016 which I urged him to attend. I recommended obtaining a CT scan as scheduled on 08/14/2016 to complete staging workup.  2. IV access:  Port-A-Cath will be used for therapy moving forward.  3. Nausea prophylaxis: No issues reported at this time. Antiemetics available to him at this time.  4. Anemia: Related to malignancy and chemotherapy infusion. Hemoglobin is adequate today without any transfusion.   5. Hypercalcemia: His calcium will be repeated today.  6. Goals of care: His cancer is incurable and any treatment is palliative at this time. His health and overall performance status is declining rapidly. I anticipate limited life expectancy less than 6 months.  7. Pain: Is manageable at this time.  8. Follow-up: On 08/18/2016 to follow his progress.  [facility], MD 6/21/201810:09 AM

## 2016-08-08 ENCOUNTER — Telehealth: Payer: Self-pay | Admitting: *Deleted

## 2016-08-08 NOTE — Telephone Encounter (Signed)
Wife calling to say patient would like an increase in his pain medication. State he was "talking out of his head" when he took the remeron and ms contin. So she is not giving him the ms contin. Suggested she hold the remeron and give the ms contin and see if that helps, if he has breakthru pain he may take the oxycodone 5 mg IR. She will try this over the weekend and see if this resolves the problem. She will call back if this is not helpful.

## 2016-08-11 ENCOUNTER — Inpatient Hospital Stay: Payer: Medicaid Other | Admitting: Medical

## 2016-08-11 ENCOUNTER — Telehealth: Payer: Self-pay

## 2016-08-11 NOTE — Telephone Encounter (Signed)
Pt was in the office early for an appt today, but decided he wanted to leave and reports he only has 3 months to live and doesn't think his sugars are that important.   Audelia Acton made aware. Pt's appt was cancelled. Victorino December

## 2016-08-12 ENCOUNTER — Ambulatory Visit: Payer: Medicaid Other

## 2016-08-13 ENCOUNTER — Other Ambulatory Visit: Payer: Self-pay | Admitting: Medical

## 2016-08-13 NOTE — Telephone Encounter (Signed)
PT needs refill of testosterone called to [facility] They are going to see if they can come tomorrow for injection, but wife wanted to pick up script tonight.

## 2016-08-13 NOTE — Telephone Encounter (Signed)
pls call in TST

## 2016-08-13 NOTE — Telephone Encounter (Signed)
Can he have a refill on this. 

## 2016-08-14 ENCOUNTER — Telehealth: Payer: Self-pay

## 2016-08-14 ENCOUNTER — Other Ambulatory Visit (INDEPENDENT_AMBULATORY_CARE_PROVIDER_SITE_OTHER): Payer: Medicaid Other

## 2016-08-14 ENCOUNTER — Other Ambulatory Visit (HOSPITAL_BASED_OUTPATIENT_CLINIC_OR_DEPARTMENT_OTHER): Payer: Medicaid Other

## 2016-08-14 ENCOUNTER — Encounter: Payer: Self-pay | Admitting: *Deleted

## 2016-08-14 DIAGNOSIS — E299 Testicular dysfunction, unspecified: Secondary | ICD-10-CM

## 2016-08-14 DIAGNOSIS — C642 Malignant neoplasm of left kidney, except renal pelvis: Secondary | ICD-10-CM | POA: Diagnosis not present

## 2016-08-14 DIAGNOSIS — D649 Anemia, unspecified: Secondary | ICD-10-CM

## 2016-08-14 DIAGNOSIS — C659 Malignant neoplasm of unspecified renal pelvis: Secondary | ICD-10-CM

## 2016-08-14 LAB — CBC WITH DIFFERENTIAL/PLATELET
BASO%: 0.1 % (ref 0.0–2.0)
Basophils Absolute: 0 10*3/uL (ref 0.0–0.1)
EOS%: 0.1 % (ref 0.0–7.0)
Eosinophils Absolute: 0 10*3/uL (ref 0.0–0.5)
HCT: 29.1 % — ABNORMAL LOW (ref 38.4–49.9)
HGB: 8.8 g/dL — ABNORMAL LOW (ref 13.0–17.1)
LYMPH%: 10.3 % — AB (ref 14.0–49.0)
MCH: 28.8 pg (ref 27.2–33.4)
MCHC: 30.2 g/dL — AB (ref 32.0–36.0)
MCV: 95.1 fL (ref 79.3–98.0)
MONO#: 2.2 10*3/uL — ABNORMAL HIGH (ref 0.1–0.9)
MONO%: 10.2 % (ref 0.0–14.0)
NEUT%: 79.3 % — AB (ref 39.0–75.0)
NEUTROS ABS: 17.2 10*3/uL — AB (ref 1.5–6.5)
Platelets: 312 10*3/uL (ref 140–400)
RBC: 3.06 10*6/uL — ABNORMAL LOW (ref 4.20–5.82)
RDW: 15.8 % — ABNORMAL HIGH (ref 11.0–14.6)
WBC: 21.7 10*3/uL — AB (ref 4.0–10.3)
lymph#: 2.2 10*3/uL (ref 0.9–3.3)
nRBC: 0 % (ref 0–0)

## 2016-08-14 LAB — COMPREHENSIVE METABOLIC PANEL
ALT: 6 U/L (ref 0–55)
AST: 8 U/L (ref 5–34)
Albumin: 1.4 g/dL — ABNORMAL LOW (ref 3.5–5.0)
Alkaline Phosphatase: 117 U/L (ref 40–150)
Anion Gap: 10 mEq/L (ref 3–11)
BUN: 22.2 mg/dL (ref 7.0–26.0)
CALCIUM: 8.9 mg/dL (ref 8.4–10.4)
CHLORIDE: 107 meq/L (ref 98–109)
CO2: 23 meq/L (ref 22–29)
CREATININE: 0.8 mg/dL (ref 0.7–1.3)
EGFR: >90 mL/min/{1.73_m2} (ref >90)
Glucose: 198 mg/dl — ABNORMAL HIGH (ref 70–140)
POTASSIUM: 4.9 meq/L (ref 3.5–5.1)
Sodium: 140 mEq/L (ref 136–145)
Total Bilirubin: 0.73 mg/dL (ref 0.20–1.20)
Total Protein: 6.2 g/dL — ABNORMAL LOW (ref 6.4–8.3)

## 2016-08-14 LAB — TECHNOLOGIST REVIEW

## 2016-08-14 MED ORDER — TESTOSTERONE CYPIONATE 200 MG/ML IM SOLN
200.0000 mg | Freq: Once | INTRAMUSCULAR | Status: AC
  Administered 2016-08-14: 200 mg via INTRAMUSCULAR

## 2016-08-14 NOTE — Telephone Encounter (Signed)
Spoke with wife,gave lab results

## 2016-08-14 NOTE — Telephone Encounter (Signed)
Wife called for lab results from today's CBC and Cmet

## 2016-08-14 NOTE — Telephone Encounter (Signed)
Please let her know about his labs

## 2016-08-19 ENCOUNTER — Other Ambulatory Visit (HOSPITAL_BASED_OUTPATIENT_CLINIC_OR_DEPARTMENT_OTHER): Payer: Medicaid Other

## 2016-08-19 ENCOUNTER — Ambulatory Visit (HOSPITAL_BASED_OUTPATIENT_CLINIC_OR_DEPARTMENT_OTHER): Payer: Medicaid Other

## 2016-08-19 ENCOUNTER — Ambulatory Visit (HOSPITAL_BASED_OUTPATIENT_CLINIC_OR_DEPARTMENT_OTHER): Payer: Medicaid Other | Admitting: Oncology

## 2016-08-19 ENCOUNTER — Ambulatory Visit (HOSPITAL_COMMUNITY)
Admission: RE | Admit: 2016-08-19 | Discharge: 2016-08-19 | Disposition: A | Discharge status: Home / Self Care | Payer: Medicaid Other | Source: Ambulatory Visit | Attending: Oncology | Admitting: Oncology

## 2016-08-19 VITALS — BP 141/65 | HR 112 | Temp 98.8°F | Resp 20 | Ht 69.0 in | Wt 182.2 lb

## 2016-08-19 DIAGNOSIS — C7800 Secondary malignant neoplasm of unspecified lung: Secondary | ICD-10-CM | POA: Diagnosis not present

## 2016-08-19 DIAGNOSIS — C659 Malignant neoplasm of unspecified renal pelvis: Secondary | ICD-10-CM | POA: Insufficient documentation

## 2016-08-19 DIAGNOSIS — D630 Anemia in neoplastic disease: Secondary | ICD-10-CM

## 2016-08-19 DIAGNOSIS — C642 Malignant neoplasm of left kidney, except renal pelvis: Secondary | ICD-10-CM

## 2016-08-19 DIAGNOSIS — D649 Anemia, unspecified: Secondary | ICD-10-CM

## 2016-08-19 LAB — CBC WITH DIFFERENTIAL/PLATELET
BASO%: 0.2 % (ref 0.0–2.0)
BASOS ABS: 0.1 10*3/uL (ref 0.0–0.1)
EOS ABS: 0 10*3/uL (ref 0.0–0.5)
EOS%: 0.1 % (ref 0.0–7.0)
HCT: 25.7 % — ABNORMAL LOW (ref 38.4–49.9)
HGB: 8.1 g/dL — ABNORMAL LOW (ref 13.0–17.1)
LYMPH%: 8.7 % — ABNORMAL LOW (ref 14.0–49.0)
MCH: 28.8 pg (ref 27.2–33.4)
MCHC: 31.5 g/dL — AB (ref 32.0–36.0)
MCV: 91.2 fL (ref 79.3–98.0)
MONO#: 2.2 10*3/uL — AB (ref 0.1–0.9)
MONO%: 10.3 % (ref 0.0–14.0)
NEUT%: 80.7 % — AB (ref 39.0–75.0)
NEUTROS ABS: 17.4 10*3/uL — AB (ref 1.5–6.5)
PLATELETS: 352 10*3/uL (ref 140–400)
RBC: 2.82 10*6/uL — ABNORMAL LOW (ref 4.20–5.82)
RDW: 16.2 % — ABNORMAL HIGH (ref 11.0–14.6)
WBC: 21.6 10*3/uL — ABNORMAL HIGH (ref 4.0–10.3)
lymph#: 1.9 10*3/uL (ref 0.9–3.3)

## 2016-08-19 LAB — COMPREHENSIVE METABOLIC PANEL
ALT: 7 U/L (ref 0–55)
ANION GAP: 11 meq/L (ref 3–11)
AST: 8 U/L (ref 5–34)
Albumin: 1.4 g/dL — ABNORMAL LOW (ref 3.5–5.0)
Alkaline Phosphatase: 94 U/L (ref 40–150)
BILIRUBIN TOTAL: 0.74 mg/dL (ref 0.20–1.20)
BUN: 19.1 mg/dL (ref 7.0–26.0)
CO2: 24 meq/L (ref 22–29)
Calcium: 9 mg/dL (ref 8.4–10.4)
Chloride: 103 mEq/L (ref 98–109)
Creatinine: 0.8 mg/dL (ref 0.7–1.3)
Glucose: 182 mg/dl — ABNORMAL HIGH (ref 70–140)
POTASSIUM: 4.8 meq/L (ref 3.5–5.1)
Sodium: 139 mEq/L (ref 136–145)
TOTAL PROTEIN: 6.4 g/dL (ref 6.4–8.3)

## 2016-08-19 LAB — PREPARE RBC (CROSSMATCH)

## 2016-08-19 LAB — TECHNOLOGIST REVIEW

## 2016-08-19 MED ORDER — ACETAMINOPHEN 325 MG PO TABS
ORAL_TABLET | ORAL | Status: AC
  Filled 2016-08-19: qty 2

## 2016-08-19 MED ORDER — DIPHENHYDRAMINE HCL 25 MG PO CAPS
ORAL_CAPSULE | ORAL | Status: AC
  Filled 2016-08-19: qty 1

## 2016-08-19 MED ORDER — ACETAMINOPHEN 325 MG PO TABS
650.0000 mg | ORAL_TABLET | Freq: Once | ORAL | Status: AC
  Administered 2016-08-19: 650 mg via ORAL

## 2016-08-19 MED ORDER — HEPARIN SOD (PORK) LOCK FLUSH 100 UNIT/ML IV SOLN
500.0000 [IU] | Freq: Every day | INTRAVENOUS | Status: AC | PRN
  Administered 2016-08-19: 500 [IU]
  Filled 2016-08-19: qty 5

## 2016-08-19 MED ORDER — SODIUM CHLORIDE 0.9% FLUSH
10.0000 mL | INTRAVENOUS | Status: AC | PRN
  Administered 2016-08-19: 10 mL
  Filled 2016-08-19: qty 10

## 2016-08-19 MED ORDER — DIPHENHYDRAMINE HCL 25 MG PO CAPS
25.0000 mg | ORAL_CAPSULE | Freq: Once | ORAL | Status: AC
Start: 2016-08-19 — End: 2016-08-19
  Administered 2016-08-19: 25 mg via ORAL

## 2016-08-19 MED ORDER — SODIUM CHLORIDE 0.9 % IV SOLN
250.0000 mL | Freq: Once | INTRAVENOUS | Status: AC
  Administered 2016-08-19: 250 mL via INTRAVENOUS

## 2016-08-19 NOTE — Patient Instructions (Signed)

## 2016-08-19 NOTE — Progress Notes (Signed)
Hematology and Oncology Follow Up Visit  David [name] [id number] 1960/09/29 56 y.o. 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 He presented with a 5 cm mass in the central left kidney as well as pulmonary metastasis, nodules and pleural effusion.   Prior Therapy:   Status post biopsy obtained on 10/12/2015 which confirmed the presence of carcinoma. 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  Chemotherapy with gemcitabine and carboplatin cycle one  on 10/30/2015. He is status post 8 cycles of therapy.   Current therapy: Pembrolizumab 200 mg every 3 weeks started on 06/26/2016. He is status post 2 cycles of therapy. Therapy discontinued based on the patient's wishes.  Interim History: David [name] presents today for a follow-up visit. Since the last visit, he reports improvement in his overall health. He is ambulating with the help of a cane without any falls or syncope. He is noticing improvement in his appetite and quality of life. He is not taking any long-acting pain medication at this time but does take breakthrough pain medication twice a day. He denied any dyspnea and exertion but does report mild fatigue.  He denied any headaches, blurry vision, syncope or seizures. He does report fevers but no chills.He does not report any chest pain, palpitation, orthopnea, leg edema or dyspnea on exertion. He denied any cough, wheezing. He does not report any nausea, vomiting or early satiety. He did report constipation which have resolved. He denied any diarrhea. He does not report any frequency, urgency or hesitancy. He does not report any skeletal complaints. Remaining review of system is unremarkable.   Medications: I have reviewed the patient's current medications.  Current Outpatient Prescriptions  Medication Sig Dispense Refill  . carvedilol (COREG) 3.125 MG tablet  take 1 tablet by mouth twice a day with meals 180 tablet 0  . clonazePAM (KLONOPIN) 0.5 MG tablet take 1 tablet by mouth 1 TO 2 TIMES A DAY if needed for anxiety (MAY CAUSE SEDATION) 30 tablet 0  . feeding supplement, ENSURE ENLIVE, (ENSURE ENLIVE) LIQD Take 237 mLs by mouth 3 (three) times daily between meals. 237 mL 12  . glucose 4 GM chewable tablet Chew by mouth.    . insulin aspart (NOVOLOG) 100 UNIT/ML FlexPen Inject into the skin.    Marland Kitchen insulin glargine (LANTUS) 100 UNIT/ML injection Inject into the skin.    Marland Kitchen lidocaine-prilocaine (EMLA) cream Apply 1 application topically as needed. 30 g 2  . megestrol (MEGACE ES) 625 MG/5ML suspension take 5 milliliters by mouth daily 150 mL 0  . metFORMIN (GLUCOPHAGE) 500 MG tablet Take 1 tablet (500 mg total) by mouth 2 (two) times daily with a meal. 180 tablet 1  . metoCLOPramide (REGLAN) 10 MG tablet take 1 tablet by mouth every 6 hours if needed for nausea  0  . mirtazapine (REMERON) 15 MG tablet Take by mouth.    . morphine (MS CONTIN) 30 MG 12 hr tablet Take 30 mg by mouth every 12 (twelve) hours.  0  . Multiple Vitamins-Minerals (MULTI-VITAMIN GUMMIES PO) Take 1 each by mouth daily.     Marland Kitchen nystatin (MYCOSTATIN) 100000 UNIT/ML suspension Use as directed 5 mLs (500,000 Units total) in the mouth or throat 4 (four) times daily. 60 mL 0  . omeprazole (PRILOSEC) 20 MG capsule Take by mouth.    . oxyCODONE (OXY IR/ROXICODONE) 5 MG immediate release tablet Take 1 tablet (5 mg total)  by mouth every 4 (four) hours as needed for severe pain. 60 tablet 0  . senna-docusate (SENOKOT-S) 8.6-50 MG tablet Take by mouth.    . testosterone cypionate (DEPOTESTOSTERONE CYPIONATE) 200 MG/ML injection INJECT 1 ML INTRAMUSCULARLY EVERY 14 DAYS 10 mL 1  . vitamin B-12 (CYANOCOBALAMIN) 1000 MCG tablet Take by mouth.    . chlorproMAZINE (THORAZINE) 50 MG tablet Take 1 tablet (50 mg total) by mouth 3 (three) times daily as needed for hiccoughs. (Patient not taking: Reported on  08/19/2016) 90 tablet 0   No current facility-administered medications for this visit.      Allergies:  Allergies  Allergen Reactions  . Morphine And Related Other (See Comments)    Confusion and hallucinating    Past Medical History, Surgical history, Social history, and Family History were reviewed and updated.   Physical Exam: Blood pressure (!) 141/65, pulse (!) 112, temperature 98.8 F (37.1 C), temperature source Oral, resp. rate 20, height 5\' 9"  (1.753 m), weight 182 lb 3.2 oz (82.6 kg), SpO2 100 %. ECOG: 1 General appearance: Alert, awake gentleman appeared without distress today. Head: Normocephalic, without obvious abnormality no oral thrush or ulcers. Neck: no adenopathy Lymph nodes: Cervical, supraclavicular, and axillary nodes normal. Heart:regular rate and rhythm, S1, S2 normal, no murmur, click, rub or gallop Lung:chest clear, no wheezing, rales, normal symmetric air entry Abdomin: soft, non-tender, without masses or organomegaly no rebound or guarding. EXT: Trace edema noted on his foot.   Lab Results: Lab Results  Component Value Date   WBC 21.6 (H) 08/19/2016   HGB 8.1 (L) 08/19/2016   HCT 25.7 (L) 08/19/2016   MCV 91.2 08/19/2016   PLT 352 08/19/2016     Chemistry      Component Value Date/Time   NA 140 08/14/2016 0810   K 4.9 08/14/2016 0810   CL 99 (L) 07/15/2016 0448   CO2 23 08/14/2016 0810   BUN 22.2 08/14/2016 0810   CREATININE 0.8 08/14/2016 0810      Component Value Date/Time   CALCIUM 8.9 08/14/2016 0810   ALKPHOS 117 08/14/2016 0810   AST 8 08/14/2016 0810   ALT 6 08/14/2016 0810   BILITOT 0.73 08/14/2016 0810      Impression and Plan:  56 year old gentleman with the following issues:  1. 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 He is status post biopsy of a pleural-based nodule which confirmed the presence of carcinoma although further immunohistochemical  staining was not performed because of lack of tissue.  These findings confirm the presence of advanced malignancy arising from the renal pelvis.  He is S/Psalvage chemotherapy with carboplatin and gemcitabine. Chemotherapy has been well tolerated with dramatic improvement in his quality of life.  CT scan on 11/09/2017showed a positive response with decrease of the volume of disease at this time.  CT scan obtained on 03/03/2016 continues to show excellent response to therapy.   He completed 8 cycles of therapy with cycle 8 of chemotherapy was given with gemcitabine only.   CT scan obtained on 06/18/2016 showed progression of disease. He is developing pulmonary based metastasis with parenchymal and pleural based nodules. He is also symptomatic from his disease and salvage therapy is required.  He is S/P Pembrolizumab and completed 2 cycles of therapy. He was experiencing further decline and opted to stop treatment at this time.  Options of therapy were reviewed again with the patient and his family. These options would  include different salvage chemotherapy such as Taxotere, resumption of immune therapy, or obtaining a tissue biopsy for Foundation medicine testing. He is in favor of obtaining tissue biopsy which would be helpful at this time for future treatment. In the meantime, he has an opinion at [facility] which I encouraged him to keep. He will return in a couple weeks to discuss treatment options at that time.   2. IV access: Port-A-Cath will be used for therapy moving forward.  3. Nausea prophylaxis: No issues reported at this time. Antiemetics available to him at this time.  4. Anemia: Related to malignancy and chemotherapy infusion. Hemoglobin is slightly down today and we will transfuse 2 units in the immediate future.  5. Hypercalcemia: His calcium will be repeated today.  6. Goals of care: His cancer is incurable and any treatment is palliative at this time.  His health and overall performance status is declining rapidly. I anticipate limited life expectancy less than 6 months.  7. Pain: He is no longer taking any long-acting pain medication. He does take breakthrough oxycodone as needed.  8. Follow-up: 09/02/2016 to follow his progress.  [facility], MD 7/3/20189:01 AM

## 2016-08-20 LAB — TYPE AND SCREEN
ABO/RH(D): B POS
ANTIBODY SCREEN: NEGATIVE
UNIT DIVISION: 0

## 2016-08-20 LAB — BPAM RBC
BLOOD PRODUCT EXPIRATION DATE: 201807282359
ISSUE DATE / TIME: 201807031057
UNIT TYPE AND RH: 7300

## 2016-08-25 ENCOUNTER — Telehealth: Payer: Self-pay | Admitting: *Deleted

## 2016-08-25 NOTE — Telephone Encounter (Signed)
Wife called and spoke to this nurse regarding chemotherapy. Patient went to [facility] for a second opinion and they are in agreement with the treatment plan Dr. Alen Blew has. Patient would like to resume chemotherapy as soon as possible. Instructed wife that Dr. Alen Blew is out of the office until July 16th. Please keep the scheduled appointments with Dr. Alen Blew on July 17th, to discuss treatment options. Wife verbalized understanding.

## 2016-08-26 ENCOUNTER — Other Ambulatory Visit: Payer: Self-pay | Admitting: *Deleted

## 2016-08-26 MED ORDER — OXYCODONE HCL 5 MG PO TABS: 5.0000 mg | ORAL_TABLET | ORAL | 0 refills | Status: DC | PRN

## 2016-08-26 NOTE — Telephone Encounter (Signed)
Called patient's wife and informed her that prescription is ready for pick up. She verbalized understanding.

## 2016-08-27 ENCOUNTER — Other Ambulatory Visit: Payer: Self-pay | Admitting: Radiology

## 2016-08-28 ENCOUNTER — Other Ambulatory Visit: Payer: Self-pay | Admitting: *Deleted

## 2016-08-28 ENCOUNTER — Ambulatory Visit (HOSPITAL_COMMUNITY)
Admission: RE | Admit: 2016-08-28 | Discharge: 2016-08-28 | Disposition: A | Discharge status: Home / Self Care | Payer: Medicaid Other | Source: Ambulatory Visit | Attending: Oncology | Admitting: Oncology

## 2016-08-28 ENCOUNTER — Encounter (HOSPITAL_COMMUNITY): Payer: Self-pay

## 2016-08-28 ENCOUNTER — Ambulatory Visit (HOSPITAL_COMMUNITY)
Admission: RE | Admit: 2016-08-28 | Discharge: 2016-08-28 | Disposition: A | Discharge status: Home / Self Care | Payer: Medicaid Other | Source: Ambulatory Visit | Attending: Interventional Radiology | Admitting: Interventional Radiology

## 2016-08-28 DIAGNOSIS — J95811 Postprocedural pneumothorax: Secondary | ICD-10-CM

## 2016-08-28 DIAGNOSIS — R918 Other nonspecific abnormal finding of lung field: Secondary | ICD-10-CM | POA: Diagnosis present

## 2016-08-28 DIAGNOSIS — Z833 Family history of diabetes mellitus: Secondary | ICD-10-CM | POA: Insufficient documentation

## 2016-08-28 DIAGNOSIS — Z79899 Other long term (current) drug therapy: Secondary | ICD-10-CM | POA: Insufficient documentation

## 2016-08-28 DIAGNOSIS — C61 Malignant neoplasm of prostate: Secondary | ICD-10-CM

## 2016-08-28 DIAGNOSIS — E785 Hyperlipidemia, unspecified: Secondary | ICD-10-CM | POA: Insufficient documentation

## 2016-08-28 DIAGNOSIS — I10 Essential (primary) hypertension: Secondary | ICD-10-CM | POA: Insufficient documentation

## 2016-08-28 DIAGNOSIS — H547 Unspecified visual loss: Secondary | ICD-10-CM | POA: Insufficient documentation

## 2016-08-28 DIAGNOSIS — Z794 Long term (current) use of insulin: Secondary | ICD-10-CM | POA: Insufficient documentation

## 2016-08-28 DIAGNOSIS — Z885 Allergy status to narcotic agent status: Secondary | ICD-10-CM | POA: Insufficient documentation

## 2016-08-28 DIAGNOSIS — C384 Malignant neoplasm of pleura: Secondary | ICD-10-CM | POA: Insufficient documentation

## 2016-08-28 DIAGNOSIS — C659 Malignant neoplasm of unspecified renal pelvis: Secondary | ICD-10-CM | POA: Diagnosis not present

## 2016-08-28 DIAGNOSIS — Z8042 Family history of malignant neoplasm of prostate: Secondary | ICD-10-CM | POA: Insufficient documentation

## 2016-08-28 DIAGNOSIS — E119 Type 2 diabetes mellitus without complications: Secondary | ICD-10-CM | POA: Diagnosis not present

## 2016-08-28 DIAGNOSIS — Z87891 Personal history of nicotine dependence: Secondary | ICD-10-CM | POA: Diagnosis not present

## 2016-08-28 LAB — CBC
HCT: 20.9 % — ABNORMAL LOW (ref 39.0–52.0)
Hemoglobin: 6.3 g/dL — CL (ref 13.0–17.0)
MCH: 27.4 pg (ref 26.0–34.0)
MCHC: 30.1 g/dL (ref 30.0–36.0)
MCV: 90.9 fL (ref 78.0–100.0)
PLATELETS: 226 10*3/uL (ref 150–400)
RBC: 2.3 MIL/uL — ABNORMAL LOW (ref 4.22–5.81)
RDW: 16 % — ABNORMAL HIGH (ref 11.5–15.5)
WBC: 18.4 10*3/uL — ABNORMAL HIGH (ref 4.0–10.5)

## 2016-08-28 LAB — GLUCOSE, CAPILLARY: Glucose-Capillary: 147 mg/dL — ABNORMAL HIGH (ref 65–99)

## 2016-08-28 LAB — APTT: aPTT: 47 seconds — ABNORMAL HIGH (ref 24–36)

## 2016-08-28 LAB — PROTIME-INR
INR: 1.64
PROTHROMBIN TIME: 19.6 s — AB (ref 11.4–15.2)

## 2016-08-28 MED ORDER — LIDOCAINE HCL (PF) 1 % IJ SOLN
INTRAMUSCULAR | Status: AC
  Filled 2016-08-28: qty 30

## 2016-08-28 MED ORDER — SODIUM CHLORIDE 0.9 % IV SOLN: INTRAVENOUS | Status: DC

## 2016-08-28 MED ORDER — MIRTAZAPINE 15 MG PO TABS
15.0000 mg | ORAL_TABLET | ORAL | Status: DC
  Filled 2016-08-28: qty 1

## 2016-08-28 MED ORDER — MIDAZOLAM HCL 2 MG/2ML IJ SOLN
INTRAMUSCULAR | Status: AC | PRN
  Administered 2016-08-28: 1 mg via INTRAVENOUS

## 2016-08-28 MED ORDER — FENTANYL CITRATE (PF) 100 MCG/2ML IJ SOLN
INTRAMUSCULAR | Status: AC | PRN
  Administered 2016-08-28: 50 ug via INTRAVENOUS

## 2016-08-28 MED ORDER — HEPARIN SOD (PORK) LOCK FLUSH 100 UNIT/ML IV SOLN
INTRAVENOUS | Status: AC
  Filled 2016-08-28: qty 5

## 2016-08-28 MED ORDER — FENTANYL CITRATE (PF) 100 MCG/2ML IJ SOLN
INTRAMUSCULAR | Status: AC
  Filled 2016-08-28: qty 4

## 2016-08-28 MED ORDER — MIDAZOLAM HCL 2 MG/2ML IJ SOLN
INTRAMUSCULAR | Status: AC
  Filled 2016-08-28: qty 4

## 2016-08-28 NOTE — Discharge Instructions (Signed)
Needle Biopsy of the Lung, Care After °This sheet gives you information about how to care for yourself after your procedure. Your health care provider may also give you more specific instructions. If you have problems or questions, contact your health care provider. °What can I expect after the procedure? °After the procedure, it is common to have: °· Soreness, pain, and tenderness where a tissue sample was taken (biopsy site). °· A cough. °· A sore throat. ° °Follow these instructions at home: °Biopsy site care °· Follow instructions from your health care provider about when to remove the bandage that was placed on the biopsy site. °· Keep the bandage dry until it has been removed. °· Check your biopsy site every day for signs of infection. Check for: °? More redness, swelling, or pain. °? More fluid or blood. °? Warmth to the touch. °? Pus or a bad smell. °General instructions °· Rest as directed by your health care provider. Ask your health care provider what activities are safe for you. °· Do not take baths, swim, or use a hot tub until your health care provider approves. °· Take over-the-counter and prescription medicines only as told by your health care provider. °· If you have airplane travel scheduled, talk with your health care provider about when it is safe for you to travel by airplane. °· It is up to you to get the results of your procedure. Ask your health care provider, or the department that is doing the procedure, when your results will be ready. °· Keep all follow-up visits as told by your health care provider. This is important. °Contact a health care provider if: °· You have more redness, swelling, or pain around your biopsy site. °· You have more fluid or blood coming from your biopsy site. °· Your biopsy site feels warm to the touch. °· You have pus or a bad smell coming from your biopsy site. °· You have a fever. °· You have pain that does not get better with medicine. °Get help right away  if: °· You have problems breathing. °· You have chest pain. °· You cough up blood. °· You faint. °· You have a fast heart rate. °Summary °· After a needle biopsy of the lung, it is common to have a cough, a sore throat, or soreness, pain, and tenderness where a tissue sample was taken (biopsy site). °· You should check your biopsy area every day for signs of infection, including pus or a bad smell, warmth, more fluid or blood, or more redness, swelling, or pain. °· You should not take baths, swim, or use a hot tub until your health care provider approves. °· It is up to you to get the results of your procedure. Ask your health care provider, or the department that is doing the procedure, when your results will be ready. °This information is not intended to replace advice given to you by your health care provider. Make sure you discuss any questions you have with your health care provider. °Document Released: 12/01/2006 Document Revised: 12/26/2015 Document Reviewed: 12/26/2015 °Elsevier Interactive Patient Education © 2017 Elsevier Inc. ° ° °

## 2016-08-28 NOTE — Sedation Documentation (Signed)
Patient is resting comfortably. 

## 2016-08-28 NOTE — Procedures (Signed)
Interventional Radiology Procedure Note  Procedure: CT guided biopsy of right pleural tissue Complications: None Recommendations: - Bedrest until CXR cleared.  Minimize talking, coughing or otherwise straining.  - Follow up 1 hr CXR pending  - NPO until CXR cleared  Signed,  Corrie Mckusick, DO

## 2016-08-28 NOTE — H&P (Signed)
Chief Complaint: Patient was seen in consultation today for biopsy of pleural nodules at the request of Shadad,Firas N  Referring Physician(s): Wyatt Portela  Supervising Physician: Corrie Mckusick  Patient Status: [facility] - Out-pt  History of Present Illness: David [name] is a 56 y.o. male with known metastatic urothelial cancer. Has undergone treatment already but repeat imaging shows progression of disease. IR is asked to do biopsy of pleural nodule disease again. Last biopsy was 09/2015 PMHx, meds, labs, imaging reviewed. Has been NPO this am. Family at bedside  Past Medical History:  Diagnosis Date  . Cancer ([place])   . Dental caries   . Diabetes mellitus, type II ([place]) 01/2012  . Hyperlipidemia   . Hypertension   . Influenza vaccine refused 01/18/2013  . Kidney cancer, primary, with metastasis from kidney to other site [facility])   . Obesity   . Pneumococcal vaccine refused 01/18/2013  . Scoliosis   . Tobacco use   . Vision impairment    left, due to prior trauma    Past Surgical History:  Procedure Laterality Date  . COLONOSCOPY W/ POLYPECTOMY    . EYE SURGERY  1996   due to trauma, left eye  . IR GENERIC HISTORICAL  11/26/2015   IR US GUIDE VASC ACCESS RIGHT 11/26/2015 [facility]-INTERV RAD  . IR GENERIC HISTORICAL  11/26/2015   IR FLUORO GUIDE PORT INSERTION RIGHT 11/26/2015 [facility]-INTERV RAD    Allergies: Morphine and related  Medications: Prior to Admission medications   Medication Sig Start Date End Date Taking? Authorizing Provider  clonazePAM (KLONOPIN) 0.5 MG tablet take 1 tablet by mouth 1 TO 2 TIMES A DAY if needed for anxiety (MAY CAUSE SEDATION) Patient taking differently: Take 0.5 mg by mouth twice daily as needed for anxiety (MAY CAUSE SEDATION) 07/30/16  Yes Wyatt Portela, MD  feeding supplement, ENSURE ENLIVE, (ENSURE ENLIVE) LIQD Take 237 mLs by mouth 3 (three) times daily between meals. Patient taking differently: Take 237 mLs by mouth 2 (two) times daily  between meals.  07/15/16  Yes Robbie Lis, MD  insulin aspart (NOVOLOG) 100 UNIT/ML FlexPen Inject 7 Units into the skin 3 (three) times daily with meals.  08/06/16 09/18/16 Yes [provider]  insulin glargine (LANTUS) 100 UNIT/ML injection Inject 10 Units into the skin at bedtime as needed (for blood sugar).  08/05/16 08/05/17 Yes [provider]  metFORMIN (GLUCOPHAGE) 500 MG tablet Take 1 tablet (500 mg total) by mouth 2 (two) times daily with a meal. 01/29/16  Yes Tysinger, Camelia Eng, PA-C  mirtazapine (REMERON) 15 MG tablet Take 15 mg by mouth at bedtime.  08/05/16 08/05/17 Yes [provider]  Multiple Vitamins-Minerals (MULTI-VITAMIN GUMMIES PO) Take 1 each by mouth daily.    Yes [provider]  omeprazole (PRILOSEC) 20 MG capsule Take 20 mg by mouth daily as needed (for acid reflux).    Yes [provider]  oxyCODONE (OXY IR/ROXICODONE) 5 MG immediate release tablet Take 1 tablet (5 mg total) by mouth every 4 (four) hours as needed for severe pain. 08/26/16  Yes Gorsuch, Ni, MD  senna-docusate (SENOKOT-S) 8.6-50 MG tablet Take 1 tablet by mouth 2 (two) times daily.  08/05/16 09/04/16 Yes [provider]  testosterone cypionate (DEPOTESTOSTERONE CYPIONATE) 200 MG/ML injection INJECT 1 ML INTRAMUSCULARLY EVERY 14 DAYS Patient taking differently: INJECT 200 MG INTRAMUSCULARLY EVERY 14 DAYS 08/13/16  Yes Tysinger, Camelia Eng, PA-C  vitamin B-12 (CYANOCOBALAMIN) 1000 MCG tablet Take 1,000 mcg by mouth daily.  Yes [provider]  carvedilol (COREG) 3.125 MG tablet take 1 tablet by mouth twice a day with meals Patient not taking: Reported on 08/28/2016 03/31/16   Tysinger, Camelia Eng, PA-C  chlorproMAZINE (THORAZINE) 50 MG tablet Take 1 tablet (50 mg total) by mouth 3 (three) times daily as needed for hiccoughs. Patient not taking: Reported on 08/19/2016 07/09/16   Wyatt Portela, MD  glucose 4 GM chewable tablet Chew 1 tablet by mouth once as needed  for low blood sugar.  08/05/16 08/05/17  [provider]  lidocaine-prilocaine (EMLA) cream Apply 1 application topically as needed. Patient not taking: Reported on 08/28/2016 04/15/16   Wyatt Portela, MD  megestrol (MEGACE ES) 625 MG/5ML suspension take 5 milliliters by mouth daily Patient not taking: Reported on 08/28/2016 07/28/16   Wyatt Portela, MD  metoCLOPramide (REGLAN) 10 MG tablet take 1 tablet by mouth every 6 hours if needed for nausea 06/27/16   [provider]     Family History  Problem Relation Age of Onset  . Diabetes Sister   . Other Sister        gastric bypass  . Hyperlipidemia Mother   . Diabetes Mother   . Cancer Father 4       prostate cancer  . Stroke Maternal Grandmother   . Heart disease Neg Hx   . Hypertension Neg Hx   . Colon cancer Neg Hx     Social History   Social History  . Marital status: Married    Spouse name: N/A  . Number of children: N/A  . Years of education: N/A   Social History Main Topics  . Smoking status: Former Smoker    Packs/day: 0.25    Years: 20.00    Types: Cigarettes  . Smokeless tobacco: Never Used  . Alcohol use No  . Drug use: No  . Sexual activity: Not Currently   Other Topics Concern  . 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  All other systems are negative.  Review of Systems  Vital Signs: BP 104/73 (BP Location: Right Arm)   Pulse (!) 110   Temp 97.7 F (36.5 C) (Oral)   Ht 5\' 9"  (1.753 m)   Wt 189 lb (85.7 kg)   SpO2 100%   BMI 27.91 kg/m   Physical Exam  Constitutional: He is oriented to person, place, and time. He appears well-developed and well-nourished. No distress.  HENT:  Head: Normocephalic.  Mouth/Throat: Oropharynx is clear and moist.  Neck: Normal range of motion. No JVD  present. No tracheal deviation present.  Cardiovascular: Normal rate, regular rhythm and normal heart sounds.   Pulmonary/Chest: Effort normal and breath sounds normal. No respiratory distress.  Abdominal: Soft. There is no tenderness. There is no guarding.  Neurological: He is alert and oriented to person, place, and time.  Skin: Skin is warm and dry.  Psychiatric: He has a normal mood and affect. Judgment normal.    Mallampati Score:  MD Evaluation Airway: WNL Heart: WNL Abdomen: WNL Chest/ Lungs: WNL ASA  Classification: 2 Mallampati/Airway Score: One  Imaging: No results found.  Labs:  CBC:  Recent Labs  08/07/16 0920 08/14/16 0810 08/19/16 0823 08/28/16 0922  WBC 24.7* 21.7* 21.6* 18.4*  HGB 9.2* 8.8* 8.1* 6.3*  HCT 28.8* 29.1* 25.7* 20.9*  PLT 284 312 352 226    COAGS:  Recent Labs  10/12/15 0946 11/26/15 0750 08/28/16 0922  INR 1.42 1.18 1.64  APTT 41* 40* 47*    BMP:  Recent Labs  07/13/16 1242 07/13/16 1254 07/14/16 0357 07/14/16 1710 07/15/16 0448  07/30/16 0754 08/07/16 0920 08/14/16 0810 08/19/16 0823  NA 132* 132* 132* 133* 132*  < > 135* 136 140 139  K 5.1 5.0 5.6* 4.6 3.8  < > 5.8 No visable hemolysis,repeated and verified* 4.0 4.9 4.8  CL 101 99* 100* 97* 99*  --   --   --   --   --   CO2 24  --  24 26 24   < > 23 22 23 24   GLUCOSE 219* 223* 174* 232* 196*  < > 312* 125 198* 182*  BUN 22* 21* 23* 25* 22*  < > 22.5 15.5 22.2 19.1  CALCIUM 8.6*  --  8.5* 8.8* 8.3*  < > 9.8 8.6 8.9 9.0  CREATININE 0.95 0.90 1.10 1.16 1.09  < > 1.2 0.8 0.8 0.8  GFRNONAA >60  --  >60 >60 >60  --   --   --   --   --   GFRAA >60  --  >60 >60 >60  --   --   --   --   --   < > = values in this interval not displayed.  LIVER FUNCTION TESTS:  Recent Labs  07/30/16 0754 08/07/16 0920 08/14/16 0810 08/19/16 0823  BILITOT 0.98 0.73 0.73 0.74  AST 8 8 8 8   ALT 9 7 6 7   ALKPHOS 130 109 117 94  PROT 7.2 6.2* 6.2* 6.4  ALBUMIN 1.8* 1.5* 1.4* 1.4*     TUMOR MARKERS:  Recent Labs  10/03/15 0905  CEA <0.5    Assessment and Plan: Metastatic urothelial cancer Progression of pleural nodules/disease. Plan biopsy Labs pending Risks and Benefits discussed with the patient including, but not limited to bleeding, hemoptysis, respiratory failure requiring intubation, infection, pneumothorax requiring chest tube placement, stroke from air embolism or even death. All of the patient's questions were answered, patient is agreeable to proceed. Consent signed and in chart.    Thank you for this interesting consult.  I greatly enjoyed meeting [facility] [facility] psychotherapist and look forward to participating in their care.  A copy of this report was sent to the requesting provider on this date.  Electronically Signed: Ascencion Dike, PA-C 08/28/2016, 10:43 AM   I spent a total of 20 minutes in face to face in clinical consultation, greater than 50% of which was counseling/coordinating care for biopsy of pleural nodules

## 2016-08-28 NOTE — Progress Notes (Signed)
Radiology charge nurse Casey notified of critical value Hgb 6.3

## 2016-08-28 NOTE — Sedation Documentation (Signed)
Patient denies pain and is resting comfortably.  

## 2016-08-29 ENCOUNTER — Other Ambulatory Visit (HOSPITAL_BASED_OUTPATIENT_CLINIC_OR_DEPARTMENT_OTHER): Payer: Medicaid Other

## 2016-08-29 ENCOUNTER — Ambulatory Visit (HOSPITAL_COMMUNITY)
Admission: RE | Admit: 2016-08-29 | Discharge: 2016-08-29 | Disposition: A | Discharge status: Home / Self Care | Payer: Medicaid Other | Source: Ambulatory Visit | Attending: Oncology | Admitting: Oncology

## 2016-08-29 ENCOUNTER — Other Ambulatory Visit: Payer: Self-pay | Admitting: *Deleted

## 2016-08-29 DIAGNOSIS — C659 Malignant neoplasm of unspecified renal pelvis: Secondary | ICD-10-CM

## 2016-08-29 DIAGNOSIS — D649 Anemia, unspecified: Secondary | ICD-10-CM

## 2016-08-29 DIAGNOSIS — C642 Malignant neoplasm of left kidney, except renal pelvis: Secondary | ICD-10-CM | POA: Diagnosis present

## 2016-08-29 LAB — PREPARE RBC (CROSSMATCH)

## 2016-08-29 LAB — CBC WITH DIFFERENTIAL/PLATELET
BASO%: 0.1 % (ref 0.0–2.0)
Basophils Absolute: 0 10*3/uL (ref 0.0–0.1)
EOS%: 0.1 % (ref 0.0–7.0)
Eosinophils Absolute: 0 10*3/uL (ref 0.0–0.5)
HEMATOCRIT: 22.5 % — AB (ref 38.4–49.9)
HGB: 6.7 g/dL — CL (ref 13.0–17.1)
LYMPH%: 11.1 % — AB (ref 14.0–49.0)
MCH: 27.5 pg (ref 27.2–33.4)
MCHC: 29.8 g/dL — AB (ref 32.0–36.0)
MCV: 92.2 fL (ref 79.3–98.0)
MONO#: 1.8 10*3/uL — AB (ref 0.1–0.9)
MONO%: 10 % (ref 0.0–14.0)
NEUT%: 78.7 % — ABNORMAL HIGH (ref 39.0–75.0)
NEUTROS ABS: 13.9 10*3/uL — AB (ref 1.5–6.5)
PLATELETS: 214 10*3/uL (ref 140–400)
RBC: 2.44 10*6/uL — AB (ref 4.20–5.82)
RDW: 15.9 % — ABNORMAL HIGH (ref 11.0–14.6)
WBC: 17.6 10*3/uL — AB (ref 4.0–10.3)
lymph#: 2 10*3/uL (ref 0.9–3.3)
nRBC: 0 % (ref 0–0)

## 2016-08-29 LAB — COMPREHENSIVE METABOLIC PANEL
ANION GAP: 11 meq/L (ref 3–11)
AST: 5 U/L (ref 5–34)
Albumin: 1.3 g/dL — ABNORMAL LOW (ref 3.5–5.0)
Alkaline Phosphatase: 104 U/L (ref 40–150)
BUN: 14 mg/dL (ref 7.0–26.0)
CALCIUM: 8.5 mg/dL (ref 8.4–10.4)
CHLORIDE: 104 meq/L (ref 98–109)
CO2: 26 meq/L (ref 22–29)
Creatinine: 0.8 mg/dL (ref 0.7–1.3)
EGFR: >90 mL/min/{1.73_m2} (ref >90)
Glucose: 152 mg/dl — ABNORMAL HIGH (ref 70–140)
POTASSIUM: 4.4 meq/L (ref 3.5–5.1)
Sodium: 140 mEq/L (ref 136–145)
Total Bilirubin: 0.69 mg/dL (ref 0.20–1.20)
Total Protein: 6.3 g/dL — ABNORMAL LOW (ref 6.4–8.3)

## 2016-08-29 MED ORDER — ACETAMINOPHEN 325 MG PO TABS
650.0000 mg | ORAL_TABLET | Freq: Once | ORAL | Status: AC
  Administered 2016-08-29: 650 mg via ORAL
  Filled 2016-08-29: qty 2

## 2016-08-29 MED ORDER — SODIUM CHLORIDE 0.9% FLUSH
3.0000 mL | INTRAVENOUS | Status: DC | PRN
Start: 2016-08-29 — End: 2016-08-30

## 2016-08-29 MED ORDER — DIPHENHYDRAMINE HCL 25 MG PO CAPS
25.0000 mg | ORAL_CAPSULE | Freq: Once | ORAL | Status: AC
  Administered 2016-08-29: 25 mg via ORAL
  Filled 2016-08-29: qty 1

## 2016-08-29 MED ORDER — SODIUM CHLORIDE 0.9 % IV SOLN: 250.0000 mL | Freq: Once | INTRAVENOUS | Status: DC

## 2016-08-29 MED ORDER — HEPARIN SOD (PORK) LOCK FLUSH 100 UNIT/ML IV SOLN
500.0000 [IU] | Freq: Every day | INTRAVENOUS | Status: AC | PRN
  Administered 2016-08-29: 500 [IU]
  Filled 2016-08-29: qty 5

## 2016-08-29 MED ORDER — HEPARIN SOD (PORK) LOCK FLUSH 100 UNIT/ML IV SOLN: 250.0000 [IU] | INTRAVENOUS | Status: DC | PRN

## 2016-08-29 MED ORDER — SODIUM CHLORIDE 0.9% FLUSH
10.0000 mL | INTRAVENOUS | Status: AC | PRN
  Administered 2016-08-29: 10 mL

## 2016-08-29 NOTE — Discharge Instructions (Signed)
Blood Transfusion, Adult, Care After This sheet gives you information about how to care for yourself after your procedure. Your health care provider may also give you more specific instructions. If you have problems or questions, contact your health care provider. What can I expect after the procedure? After your procedure, it is common to have:  Bruising and soreness where the IV tube was inserted.  Headache.  Follow these instructions at home:  Take over-the-counter and prescription medicines only as told by your health care provider.  Return to your normal activities as told by your health care provider.  Follow instructions from your health care provider about how to take care of your IV insertion site. Make sure you: ? Wash your hands with soap and water before you change your bandage (dressing). If soap and water are not available, use hand sanitizer. ? Change your dressing as told by your health care provider.  Check your IV insertion site every day for signs of infection. Check for: ? More redness, swelling, or pain. ? More fluid or blood. ? Warmth. ? Pus or a bad smell. Contact a health care provider if:  You have more redness, swelling, or pain around the IV insertion site.  You have more fluid or blood coming from the IV insertion site.  Your IV insertion site feels warm to the touch.  You have pus or a bad smell coming from the IV insertion site.  Your urine turns pink, red, or brown.  You feel weak after doing your normal activities. Get help right away if:  You have signs of a serious allergic or immune system reaction, including: ? Itchiness. ? Hives. ? Trouble breathing. ? Anxiety. ? Chest or lower back pain. ? Fever, flushing, and chills. ? Rapid pulse. ? Rash. ? Diarrhea. ? Vomiting. ? Dark urine. ? Serious headache. ? Dizziness. ? Stiff neck. ? Yellow coloration of the face or the white parts of the eyes (jaundice). This information is not  intended to replace advice given to you by your health care provider. Make sure you discuss any questions you have with your health care provider. Document Released: 02/24/2014 Document Revised: 10/03/2015 Document Reviewed: 08/20/2015 Elsevier Interactive Patient Education  2018 Elsevier Inc.  

## 2016-08-29 NOTE — Progress Notes (Addendum)
Procedure: Transfusion of 2 units PRBCs  Ordering Provider: Alen Blew, F.  Associated Diagnosis: Anemia, unspecified type (D64.9)  Pt arrived to receive 2 units PRBCs; port accessed, units infused, no complications noted; pt alert, oriented, and ambulatory upon discharge; accompanied by family

## 2016-08-30 LAB — BPAM RBC
Blood Product Expiration Date: 201808022359
Blood Product Expiration Date: 201808022359
ISSUE DATE / TIME: 201807131009
ISSUE DATE / TIME: 201807131009
UNIT TYPE AND RH: 7300
Unit Type and Rh: 7300

## 2016-08-30 LAB — TYPE AND SCREEN
ABO/RH(D): B POS
Antibody Screen: NEGATIVE
UNIT DIVISION: 0
UNIT DIVISION: 0

## 2016-09-02 ENCOUNTER — Telehealth: Payer: Self-pay | Admitting: Oncology

## 2016-09-02 ENCOUNTER — Ambulatory Visit (HOSPITAL_BASED_OUTPATIENT_CLINIC_OR_DEPARTMENT_OTHER): Payer: Medicaid Other | Admitting: Oncology

## 2016-09-02 ENCOUNTER — Other Ambulatory Visit (HOSPITAL_BASED_OUTPATIENT_CLINIC_OR_DEPARTMENT_OTHER): Payer: Medicaid Other

## 2016-09-02 VITALS — BP 102/60 | HR 109 | Temp 97.7°F | Resp 18 | Ht 69.0 in | Wt 185.5 lb

## 2016-09-02 DIAGNOSIS — D630 Anemia in neoplastic disease: Secondary | ICD-10-CM

## 2016-09-02 DIAGNOSIS — C642 Malignant neoplasm of left kidney, except renal pelvis: Secondary | ICD-10-CM | POA: Diagnosis present

## 2016-09-02 DIAGNOSIS — D649 Anemia, unspecified: Secondary | ICD-10-CM

## 2016-09-02 DIAGNOSIS — C61 Malignant neoplasm of prostate: Secondary | ICD-10-CM

## 2016-09-02 DIAGNOSIS — C659 Malignant neoplasm of unspecified renal pelvis: Secondary | ICD-10-CM

## 2016-09-02 DIAGNOSIS — C7800 Secondary malignant neoplasm of unspecified lung: Secondary | ICD-10-CM

## 2016-09-02 LAB — COMPREHENSIVE METABOLIC PANEL
ALBUMIN: 1.3 g/dL — AB (ref 3.5–5.0)
ALK PHOS: 120 U/L (ref 40–150)
ALT: <6 U/L (ref 0–55)
ANION GAP: 9 meq/L (ref 3–11)
AST: 5 U/L (ref 5–34)
BUN: 16.3 mg/dL (ref 7.0–26.0)
CALCIUM: 8.3 mg/dL — AB (ref 8.4–10.4)
CHLORIDE: 102 meq/L (ref 98–109)
CO2: 26 mEq/L (ref 22–29)
Creatinine: 0.8 mg/dL (ref 0.7–1.3)
Glucose: 158 mg/dl — ABNORMAL HIGH (ref 70–140)
POTASSIUM: 4.1 meq/L (ref 3.5–5.1)
Sodium: 136 mEq/L (ref 136–145)
Total Bilirubin: 0.68 mg/dL (ref 0.20–1.20)
Total Protein: 5.9 g/dL — ABNORMAL LOW (ref 6.4–8.3)

## 2016-09-02 LAB — CBC WITH DIFFERENTIAL/PLATELET
BASO%: 0.2 % (ref 0.0–2.0)
Basophils Absolute: 0 10*3/uL (ref 0.0–0.1)
EOS ABS: 0 10*3/uL (ref 0.0–0.5)
EOS%: 0.1 % (ref 0.0–7.0)
HEMATOCRIT: 26.9 % — AB (ref 38.4–49.9)
HGB: 8.2 g/dL — ABNORMAL LOW (ref 13.0–17.1)
LYMPH%: 12.5 % — AB (ref 14.0–49.0)
MCH: 27.9 pg (ref 27.2–33.4)
MCHC: 30.5 g/dL — AB (ref 32.0–36.0)
MCV: 91.5 fL (ref 79.3–98.0)
MONO#: 1.6 10*3/uL — AB (ref 0.1–0.9)
MONO%: 8.4 % (ref 0.0–14.0)
NEUT%: 78.8 % — ABNORMAL HIGH (ref 39.0–75.0)
NEUTROS ABS: 15 10*3/uL — AB (ref 1.5–6.5)
NRBC: 0 % (ref 0–0)
Platelets: 118 10*3/uL — ABNORMAL LOW (ref 140–400)
RBC: 2.94 10*6/uL — AB (ref 4.20–5.82)
RDW: 16.2 % — AB (ref 11.0–14.6)
WBC: 19.1 10*3/uL — AB (ref 4.0–10.3)
lymph#: 2.4 10*3/uL (ref 0.9–3.3)

## 2016-09-02 MED ORDER — MIRTAZAPINE 15 MG PO TABS: 15.0000 mg | ORAL_TABLET | Freq: Two times a day (BID) | ORAL | 11 refills | Status: AC

## 2016-09-02 MED ORDER — OXYCODONE HCL 5 MG PO TABS: 5.0000 mg | ORAL_TABLET | ORAL | 0 refills | Status: DC | PRN

## 2016-09-02 NOTE — Telephone Encounter (Signed)
Scheduled appt per 7/17 los - Gave patient AVS and calender per los. - [facility] to contact patient with ct schedule. Per Dr. Alen Blew okay for chemo on 7/20 instead of 7/19

## 2016-09-02 NOTE — Progress Notes (Signed)
Hematology and Oncology Follow Up Visit  David [name] [id number] June 01, 1960 56 y.o. 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 He presented with a 5 cm mass in the central left kidney as well as pulmonary metastasis, nodules and pleural effusion.   Prior Therapy:   Status post biopsy obtained on 10/12/2015 which confirmed the presence of carcinoma. 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  Chemotherapy with gemcitabine and carboplatin cycle one  on 10/30/2015. He is status post 8 cycles of therapy.   Current therapy: Pembrolizumab 200 mg every 3 weeks started on 06/26/2016. He is status post 2 cycles of therapy. Therapy to restart in the near future.  Interim History: David [name] presents today for a follow-up visit. Since the last visit, he reports increase in his pain levels. He has been using oxycodone at 5 mg every 4 hours and at times requiring 10 mg. He has been prescribed long-acting morphine in the past which she has not been taking. He also started Remeron which have helped his appetite. Overall performance status continues to be limited and he is ambulating very little at this time.  He was seen at [facility] and was recommended to resume Pembrolizumab as discussed previously. He is not a candidate for clinical trial because of performance status and anemia.  He denied any headaches, blurry vision, syncope or seizures. He does report fevers but no chills.He does not report any chest pain, palpitation, orthopnea, leg edema or dyspnea on exertion. He denied any cough, wheezing. He does not report any nausea, vomiting or early satiety. He did report constipation which have resolved. He denied any diarrhea. He does not report any frequency, urgency or hesitancy. He does not report any skeletal complaints. Remaining review of system is  unremarkable.   Medications: I have reviewed the patient's current medications.  Current Outpatient Prescriptions  Medication Sig Dispense Refill  . carvedilol (COREG) 3.125 MG tablet take 1 tablet by mouth twice a day with meals (Patient not taking: Reported on 08/28/2016) 180 tablet 0  . chlorproMAZINE (THORAZINE) 50 MG tablet Take 1 tablet (50 mg total) by mouth 3 (three) times daily as needed for hiccoughs. (Patient not taking: Reported on 08/19/2016) 90 tablet 0  . clonazePAM (KLONOPIN) 0.5 MG tablet take 1 tablet by mouth 1 TO 2 TIMES A DAY if needed for anxiety (MAY CAUSE SEDATION) (Patient taking differently: Take 0.5 mg by mouth twice daily as needed for anxiety (MAY CAUSE SEDATION)) 30 tablet 0  . feeding supplement, ENSURE ENLIVE, (ENSURE ENLIVE) LIQD Take 237 mLs by mouth 3 (three) times daily between meals. (Patient taking differently: Take 237 mLs by mouth 2 (two) times daily between meals. ) 237 mL 12  . glucose 4 GM chewable tablet Chew 1 tablet by mouth once as needed for low blood sugar.     . insulin aspart (NOVOLOG) 100 UNIT/ML FlexPen Inject 7 Units into the skin 3 (three) times daily with meals.     . insulin glargine (LANTUS) 100 UNIT/ML injection Inject 10 Units into the skin at bedtime as needed (for blood sugar).     Marland Kitchen lidocaine-prilocaine (EMLA) cream Apply 1 application topically as needed. (Patient not taking: Reported on 08/28/2016) 30 g 2  . megestrol (MEGACE ES) 625 MG/5ML suspension take 5 milliliters by mouth daily (Patient not taking: Reported on 08/28/2016) 150 mL 0  . metFORMIN (GLUCOPHAGE) 500 MG  tablet Take 1 tablet (500 mg total) by mouth 2 (two) times daily with a meal. 180 tablet 1  . metoCLOPramide (REGLAN) 10 MG tablet take 1 tablet by mouth every 6 hours if needed for nausea  0  . mirtazapine (REMERON) 15 MG tablet Take 1 tablet (15 mg total) by mouth 2 (two) times daily. 60 tablet 11  . Multiple Vitamins-Minerals (MULTI-VITAMIN GUMMIES PO) Take 1 each by  mouth daily.     Marland Kitchen omeprazole (PRILOSEC) 20 MG capsule Take 20 mg by mouth daily as needed (for acid reflux).     Marland Kitchen oxyCODONE (OXY IR/ROXICODONE) 5 MG immediate release tablet Take 1 tablet (5 mg total) by mouth every 4 (four) hours as needed for severe pain. 60 tablet 0  . senna-docusate (SENOKOT-S) 8.6-50 MG tablet Take 1 tablet by mouth 2 (two) times daily.     Marland Kitchen testosterone cypionate (DEPOTESTOSTERONE CYPIONATE) 200 MG/ML injection INJECT 1 ML INTRAMUSCULARLY EVERY 14 DAYS (Patient taking differently: INJECT 200 MG INTRAMUSCULARLY EVERY 14 DAYS) 10 mL 1  . vitamin B-12 (CYANOCOBALAMIN) 1000 MCG tablet Take 1,000 mcg by mouth daily.      No current facility-administered medications for this visit.      Allergies:  Allergies  Allergen Reactions  . Morphine And Related Other (See Comments)    Confusion and hallucinating - cannot take with anxiety medication    Past Medical History, Surgical history, Social history, and Family History were reviewed and updated.   Physical Exam: Blood pressure 102/60, pulse (!) 109, temperature 97.7 F (36.5 C), temperature source Oral, resp. rate 18, height 5\' 9"  (1.753 m), weight 185 lb 8 oz (84.1 kg), SpO2 97 %. ECOG: 2 General appearance: Chronically ill appearing gentleman without distress. Head: Normocephalic, without obvious abnormality no oral ulcers or lesions. Neck: no adenopathy Lymph nodes: Cervical, supraclavicular, and axillary nodes normal. Heart:regular rate and rhythm, S1, S2 normal, no murmur, click, rub or gallop Lung:chest clear, no wheezing, rales, normal symmetric air entry Abdomin: soft, non-tender, without masses or organomegaly no shifting dullness or ascites. EXT: 1+ edema noted bilaterally.   Lab Results: Lab Results  Component Value Date   WBC 19.1 (H) 09/02/2016   HGB 8.2 (L) 09/02/2016   HCT 26.9 (L) 09/02/2016   MCV 91.5 09/02/2016   PLT 118 (L) 09/02/2016     Chemistry      Component Value Date/Time   NA  140 08/29/2016 0858   K 4.4 08/29/2016 0858   CL 99 (L) 07/15/2016 0448   CO2 26 08/29/2016 0858   BUN 14.0 08/29/2016 0858   CREATININE 0.8 08/29/2016 0858      Component Value Date/Time   CALCIUM 8.5 08/29/2016 0858   ALKPHOS 104 08/29/2016 0858   AST 5 08/29/2016 0858   ALT <6 08/29/2016 0858   BILITOT 0.69 08/29/2016 0858      Impression and Plan:  56 year old gentleman with the following issues:  1. 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 He is status post biopsy of a pleural-based nodule which confirmed the presence of carcinoma although further immunohistochemical staining was not performed because of lack of tissue.  These findings confirm the presence of advanced malignancy arising from the renal pelvis.  He is S/Psalvage chemotherapy with carboplatin and gemcitabine. Chemotherapy has been well tolerated with dramatic improvement in his quality of life.  CT scan on 11/09/2017showed a positive response with decrease of the volume of disease at  this time.  CT scan obtained on 03/03/2016 continues to show excellent response to therapy.   He completed 8 cycles of therapy with cycle 8 of chemotherapy was given with gemcitabine only.   CT scan obtained on 06/18/2016 showed progression of disease. He is developing pulmonary based metastasis with parenchymal and pleural based nodules. He is also symptomatic from his disease and salvage therapy is required.  He is S/P Pembrolizumab and completed 2 cycles of therapy. He was experiencing further decline and opted to stop treatment at this time.  Options of treatment were reviewed again today after he obtained a second opinion at [facility]. After discussion, he is agreeable to proceed but Pembrolizumab every 3 weeks. I will repeat imaging studies for staging purposes as a new baseline. He also underwent a biopsy and testing for future  treatment via [facility]. This will be an option in the future if needed to.   2. IV access: Port-A-Cath will be used for therapy moving forward.  3. Nausea prophylaxis: No issues reported at this time. Antiemetics available to him at this time.  4. Anemia: Related to malignancy and chemotherapy infusion. Hemoglobin improved after transfusion last week. No transfusion is needed this week.  5. Hypercalcemia: His calcium has normalized.  6. Goals of care: His cancer is incurable and any treatment is palliative at this time. His health and overall performance status is declining rapidly. Any treatment in the future would be palliative at best.  7. Pain: I recommended resuming morphine long-acting pain medication at twice a day and use oxycodone as needed.  8. Follow-up: Weekly basis for laboratory testing and in 3 weeks before the next treatment of Pembrolizumab.  Zola Button, MD 7/17/201812:34 PM

## 2016-09-03 ENCOUNTER — Other Ambulatory Visit: Payer: Self-pay | Admitting: *Deleted

## 2016-09-03 ENCOUNTER — Encounter: Payer: Self-pay | Admitting: *Deleted

## 2016-09-03 DIAGNOSIS — C659 Malignant neoplasm of unspecified renal pelvis: Secondary | ICD-10-CM

## 2016-09-03 NOTE — Progress Notes (Signed)
Per Dr. Alen Blew, I made a referral for Physical Therapy to evaluate and treat. [place].

## 2016-09-04 ENCOUNTER — Encounter (HOSPITAL_COMMUNITY): Payer: Self-pay | Admitting: Emergency Medicine

## 2016-09-04 ENCOUNTER — Telehealth: Payer: Self-pay | Admitting: *Deleted

## 2016-09-04 ENCOUNTER — Emergency Department (HOSPITAL_COMMUNITY)
Admission: EM | Admit: 2016-09-04 | Discharge: 2016-09-04 | Disposition: A | Discharge status: Home / Self Care | Payer: Medicaid Other | Attending: Emergency Medicine | Admitting: Emergency Medicine

## 2016-09-04 ENCOUNTER — Emergency Department (HOSPITAL_COMMUNITY): Payer: Medicaid Other

## 2016-09-04 DIAGNOSIS — R000 Tachycardia, unspecified: Secondary | ICD-10-CM | POA: Diagnosis not present

## 2016-09-04 DIAGNOSIS — Z794 Long term (current) use of insulin: Secondary | ICD-10-CM | POA: Insufficient documentation

## 2016-09-04 DIAGNOSIS — Z79899 Other long term (current) drug therapy: Secondary | ICD-10-CM | POA: Insufficient documentation

## 2016-09-04 DIAGNOSIS — C642 Malignant neoplasm of left kidney, except renal pelvis: Secondary | ICD-10-CM | POA: Diagnosis not present

## 2016-09-04 DIAGNOSIS — E8809 Other disorders of plasma-protein metabolism, not elsewhere classified: Secondary | ICD-10-CM | POA: Diagnosis not present

## 2016-09-04 DIAGNOSIS — D72829 Elevated white blood cell count, unspecified: Secondary | ICD-10-CM | POA: Diagnosis not present

## 2016-09-04 DIAGNOSIS — C7800 Secondary malignant neoplasm of unspecified lung: Secondary | ICD-10-CM | POA: Insufficient documentation

## 2016-09-04 DIAGNOSIS — C799 Secondary malignant neoplasm of unspecified site: Secondary | ICD-10-CM

## 2016-09-04 DIAGNOSIS — R109 Unspecified abdominal pain: Secondary | ICD-10-CM | POA: Diagnosis not present

## 2016-09-04 DIAGNOSIS — D649 Anemia, unspecified: Secondary | ICD-10-CM

## 2016-09-04 DIAGNOSIS — I10 Essential (primary) hypertension: Secondary | ICD-10-CM | POA: Diagnosis not present

## 2016-09-04 DIAGNOSIS — G8929 Other chronic pain: Secondary | ICD-10-CM | POA: Insufficient documentation

## 2016-09-04 DIAGNOSIS — D696 Thrombocytopenia, unspecified: Secondary | ICD-10-CM | POA: Diagnosis not present

## 2016-09-04 DIAGNOSIS — E1165 Type 2 diabetes mellitus with hyperglycemia: Secondary | ICD-10-CM | POA: Diagnosis not present

## 2016-09-04 DIAGNOSIS — N390 Urinary tract infection, site not specified: Secondary | ICD-10-CM | POA: Diagnosis not present

## 2016-09-04 DIAGNOSIS — Z87891 Personal history of nicotine dependence: Secondary | ICD-10-CM | POA: Insufficient documentation

## 2016-09-04 DIAGNOSIS — R310 Gross hematuria: Secondary | ICD-10-CM | POA: Insufficient documentation

## 2016-09-04 LAB — COMPREHENSIVE METABOLIC PANEL
ALK PHOS: 97 U/L (ref 38–126)
ALT: 7 U/L — AB (ref 17–63)
AST: 10 U/L — ABNORMAL LOW (ref 15–41)
Albumin: 1.5 g/dL — ABNORMAL LOW (ref 3.5–5.0)
Anion gap: 6 (ref 5–15)
BUN: 15 mg/dL (ref 6–20)
CALCIUM: 7.7 mg/dL — AB (ref 8.9–10.3)
CHLORIDE: 99 mmol/L — AB (ref 101–111)
CO2: 28 mmol/L (ref 22–32)
CREATININE: 0.72 mg/dL (ref 0.61–1.24)
GFR calc non Af Amer: >60 mL/min (ref >60)
Glucose, Bld: 198 mg/dL — ABNORMAL HIGH (ref 65–99)
Potassium: 4.1 mmol/L (ref 3.5–5.1)
SODIUM: 133 mmol/L — AB (ref 135–145)
Total Bilirubin: 0.9 mg/dL (ref 0.3–1.2)
Total Protein: 5.8 g/dL — ABNORMAL LOW (ref 6.5–8.1)

## 2016-09-04 LAB — URINALYSIS, ROUTINE W REFLEX MICROSCOPIC
Bilirubin Urine: NEGATIVE
Glucose, UA: 50 mg/dL — AB
KETONES UR: NEGATIVE mg/dL
Nitrite: NEGATIVE
PH: 6 (ref 5.0–8.0)
PROTEIN: 100 mg/dL — AB
SQUAMOUS EPITHELIAL / LPF: NONE SEEN
Specific Gravity, Urine: >1.046 — ABNORMAL HIGH (ref 1.005–1.030)

## 2016-09-04 LAB — CBC WITH DIFFERENTIAL/PLATELET
BASOS ABS: 0 10*3/uL (ref 0.0–0.1)
BASOS PCT: 0 %
EOS ABS: 0 10*3/uL (ref 0.0–0.7)
Eosinophils Relative: 0 %
HCT: 23.5 % — ABNORMAL LOW (ref 39.0–52.0)
HEMOGLOBIN: 7.5 g/dL — AB (ref 13.0–17.0)
LYMPHS PCT: 14 %
Lymphs Abs: 2.6 10*3/uL (ref 0.7–4.0)
MCH: 27.7 pg (ref 26.0–34.0)
MCHC: 31.9 g/dL (ref 30.0–36.0)
MCV: 86.7 fL (ref 78.0–100.0)
MONO ABS: 1.9 10*3/uL — AB (ref 0.1–1.0)
Monocytes Relative: 10 %
NEUTROS PCT: 76 %
Neutro Abs: 14 10*3/uL — ABNORMAL HIGH (ref 1.7–7.7)
PLATELETS: 84 10*3/uL — AB (ref 150–400)
RBC: 2.71 MIL/uL — AB (ref 4.22–5.81)
RDW: 15.9 % — ABNORMAL HIGH (ref 11.5–15.5)
WBC: 18.5 10*3/uL — AB (ref 4.0–10.5)

## 2016-09-04 LAB — LIPASE, BLOOD: Lipase: 11 U/L (ref 11–51)

## 2016-09-04 LAB — APTT: aPTT: 40 seconds — ABNORMAL HIGH (ref 24–36)

## 2016-09-04 LAB — PROTIME-INR
INR: 1.45
PROTHROMBIN TIME: 17.7 s — AB (ref 11.4–15.2)

## 2016-09-04 MED ORDER — IOPAMIDOL (ISOVUE-300) INJECTION 61%
100.0000 mL | Freq: Once | INTRAVENOUS | Status: AC | PRN
  Administered 2016-09-04: 100 mL via INTRAVENOUS

## 2016-09-04 MED ORDER — MORPHINE SULFATE (PF) 4 MG/ML IV SOLN
4.0000 mg | Freq: Once | INTRAVENOUS | Status: AC
  Administered 2016-09-04: 4 mg via INTRAVENOUS
  Filled 2016-09-04: qty 1

## 2016-09-04 MED ORDER — SODIUM CHLORIDE 0.9 % IV BOLUS (SEPSIS)
500.0000 mL | Freq: Once | INTRAVENOUS | Status: AC
  Administered 2016-09-04: 500 mL via INTRAVENOUS

## 2016-09-04 MED ORDER — HEPARIN SOD (PORK) LOCK FLUSH 100 UNIT/ML IV SOLN
500.0000 [IU] | Freq: Once | INTRAVENOUS | Status: AC
  Administered 2016-09-04: 500 [IU]
  Filled 2016-09-04: qty 5

## 2016-09-04 MED ORDER — CEPHALEXIN 500 MG PO CAPS: 500.0000 mg | ORAL_CAPSULE | Freq: Two times a day (BID) | ORAL | 0 refills | Status: DC

## 2016-09-04 MED ORDER — CEPHALEXIN 500 MG PO CAPS
500.0000 mg | ORAL_CAPSULE | Freq: Once | ORAL | Status: AC
  Administered 2016-09-04: 500 mg via ORAL
  Filled 2016-09-04: qty 1

## 2016-09-04 MED ORDER — IOPAMIDOL (ISOVUE-300) INJECTION 61%
INTRAVENOUS | Status: AC
  Filled 2016-09-04: qty 100

## 2016-09-04 MED ORDER — CHLORPROMAZINE HCL 25 MG PO TABS
50.0000 mg | ORAL_TABLET | Freq: Once | ORAL | Status: AC
  Administered 2016-09-04: 50 mg via ORAL
  Filled 2016-09-04: qty 2

## 2016-09-04 NOTE — ED Notes (Signed)
[facility] Provider at bedside. 

## 2016-09-04 NOTE — ED Provider Notes (Signed)
[place] DEPT Provider Note   CSN: [id number] Arrival date & time: 09/04/16  1225     History   Chief Complaint Chief Complaint  Patient presents with  . Hematuria    HPI David [name] is a 56 y.o. 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 Patient states that he went to use the restroom, and had a large dark red blood clot pass and brighter red hematuria in the urine. This only occurred once, and he has not urinated again since then. He had some discomfort during that urination as well. He states that over the last 1 week he's had increasing urinary hesitancy, but states that once he starts his stream he has a good stream thereafter, denies any dribbling or weakened stream. Additional associated symptoms include urinary urgency. He has not tried anything for his symptoms, and other than urination there has been no aggravating factors. He also has chronic abd pain which is unchanged, and he gets morphine at home for this. He is scheduled to start his chemotherapy again tomorrow, as well as scheduled for a CT chest, abdomen, and pelvis with contrast on 09/09/16. His oncologist is Dr. Zola Button. He has never seen a urologist. He is not on any blood thinners.   He denies fevers, chills, CP, SOB, worsening/new/changed abd pain, N/V/D/C, melena, hematochezia, urinary frequency, dribbling, weak stream of urine, genital sores, penile pain or lesions, testicular pain/swelling, penile discharge, myalgias, arthralgias, numbness, tingling, focal weakness, or any other complaints at  this time.    The history is provided by the patient, medical records and the spouse. No language interpreter was used.  Hematuria  This is a new problem. The current episode started 1 to 2 hours ago. The problem occurs rarely. The problem has not changed since onset.Associated symptoms include abdominal pain (chronic, unchanged). Pertinent negatives include no chest pain and no shortness of breath. Exacerbated by: urination. Nothing relieves the symptoms. He has tried nothing for the symptoms. The treatment provided no relief.    Past Medical History:  Diagnosis Date  . Cancer ([place])   . Dental caries   . Diabetes mellitus, type II ([place]) 01/2012  . Hyperlipidemia   . Hypertension   . Influenza vaccine refused 01/18/2013  . Kidney cancer, primary, with metastasis from kidney to other site [facility])   . Obesity   . Pneumococcal vaccine refused 01/18/2013  . Scoliosis   . Tobacco use   . Vision impairment    left, due to prior trauma    Patient Active Problem List   Diagnosis Date Noted  . Anemia 07/14/2016  . Abscess of buttock, right 03/24/2016  . Elevated CK 03/24/2016  . Myalgia 03/24/2016  . Edema 03/24/2016  . Folliculitis 97/03/6376  . Rash 03/24/2016  . Acute serous otitis media 03/17/2016  . Port catheter in place 12/11/2015  . Protein-calorie malnutrition, severe 11/15/2015  . Hyponatremia 11/14/2015  . Hypoalbuminemia due to protein-calorie malnutrition ([place]) 11/14/2015  . Generalized weakness 11/14/2015  . Mild dehydration 11/14/2015  . SIRS (systemic inflammatory response syndrome) ([facility])   . S/P thoracentesis   . Fever,  unspecified 10/25/2015  . Pleural effusion on right 10/25/2015  . Pleural mass 10/25/2015  . Symptomatic anemia 10/24/2015  . Primary cancer of renal pelvis ([place]) 10/18/2015  . Abnormal CT of the chest 10/03/2015  . Screening for prostate cancer 10/03/2015  . Creatinine elevation 10/03/2015  . Pulmonary nodules 10/03/2015  . Loss of weight  10/03/2015  . Lymphadenopathy 10/03/2015  . History of colonic polyps 10/03/2015  . Leukocytosis 10/03/2015  . Anemia, unspecified 10/03/2015  . Former smoker 10/03/2015  . DM2 (diabetes mellitus, type 2) ([place]) 01/18/2013  . Obesity, unspecified 01/18/2013    Past Surgical History:  Procedure Laterality Date  . COLONOSCOPY W/ POLYPECTOMY    . EYE SURGERY  1996   due to trauma, left eye  . IR GENERIC HISTORICAL  11/26/2015   IR US GUIDE VASC ACCESS RIGHT 11/26/2015 [facility]  . IR GENERIC HISTORICAL  11/26/2015   IR FLUORO GUIDE PORT INSERTION RIGHT 11/26/2015 [facility]       Home Medications    Prior to Admission medications   Medication Sig Start Date End Date Taking? Authorizing Provider  carvedilol (COREG) 3.125 MG tablet take 1 tablet by mouth twice a day with meals Patient not taking: Reported on 08/28/2016 03/31/16   Tysinger, Camelia Eng, PA-C  chlorproMAZINE (THORAZINE) 50 MG tablet Take 1 tablet (50 mg total) by mouth 3 (three) times daily as needed for hiccoughs. Patient not taking: Reported on 08/19/2016 07/09/16   Wyatt Portela, MD  clonazePAM (KLONOPIN) 0.5 MG tablet take 1 tablet by mouth 1 TO 2 TIMES A DAY if needed for anxiety (MAY CAUSE SEDATION) Patient taking differently: Take 0.5 mg by mouth twice daily as needed for anxiety (MAY CAUSE SEDATION) 07/30/16   Wyatt Portela, MD  feeding supplement, ENSURE ENLIVE, (ENSURE ENLIVE) LIQD Take 237 mLs by mouth 3 (three) times daily between meals. Patient taking differently: Take 237 mLs by mouth 2 (two) times daily between meals.  07/15/16   Robbie Lis, MD  glucose 4 GM chewable tablet Chew 1 tablet by mouth once as needed for low blood sugar.  08/05/16 08/05/17  [provider]  insulin aspart (NOVOLOG) 100 UNIT/ML FlexPen Inject 7 Units into the skin 3 (three) times daily with meals.  08/06/16 09/18/16  [provider]  insulin glargine (LANTUS) 100 UNIT/ML injection Inject 10 Units into the skin at  bedtime as needed (for blood sugar).  08/05/16 08/05/17  [provider]  lidocaine-prilocaine (EMLA) cream Apply 1 application topically as needed. Patient not taking: Reported on 08/28/2016 04/15/16   Wyatt Portela, MD  megestrol (MEGACE ES) 625 MG/5ML suspension take 5 milliliters by mouth daily Patient not taking: Reported on 08/28/2016 07/28/16   Wyatt Portela, MD  metFORMIN (GLUCOPHAGE) 500 MG tablet Take 1 tablet (500 mg total) by mouth 2 (two) times daily with a meal. 01/29/16   Tysinger, Camelia Eng, PA-C  metoCLOPramide (REGLAN) 10 MG tablet take 1 tablet by mouth every 6 hours if needed for nausea 06/27/16   [provider]  mirtazapine (REMERON) 15 MG tablet Take 1 tablet (15 mg total) by mouth 2 (two) times daily. 09/02/16 09/02/17  Wyatt Portela, MD  Multiple Vitamins-Minerals (MULTI-VITAMIN GUMMIES PO) Take 1 each by mouth daily.     [provider]  omeprazole (PRILOSEC) 20 MG capsule Take 20 mg by mouth daily as needed (for acid reflux).     [provider]  oxyCODONE (OXY IR/ROXICODONE) 5 MG immediate release  tablet Take 1 tablet (5 mg total) by mouth every 4 (four) hours as needed for severe pain. 09/02/16   Wyatt Portela, MD  senna-docusate (SENOKOT-S) 8.6-50 MG tablet Take 1 tablet by mouth 2 (two) times daily.  08/05/16 09/04/16  [provider]  testosterone cypionate (DEPOTESTOSTERONE CYPIONATE) 200 MG/ML injection INJECT 1 ML INTRAMUSCULARLY EVERY 14 DAYS Patient taking differently: INJECT 200 MG INTRAMUSCULARLY EVERY 14 DAYS 08/13/16   Tysinger, Camelia Eng, PA-C  vitamin B-12 (CYANOCOBALAMIN) 1000 MCG tablet Take 1,000 mcg by mouth daily.     [provider]    Family History Family History  Problem Relation Age of Onset  . Diabetes Sister   . Other Sister        gastric bypass  . Hyperlipidemia Mother   . Diabetes Mother   . Cancer Father 37       prostate cancer  . Stroke Maternal Grandmother   . Heart disease Neg Hx     . Hypertension Neg Hx   . Colon cancer Neg Hx     Social History Social History  Substance Use Topics  . Smoking status: Former Smoker    Packs/day: 0.25    Years: 20.00    Types: Cigarettes  . Smokeless tobacco: Never Used  . Alcohol use No     Allergies   Morphine and related   Review of Systems Review of Systems  Constitutional: Negative for chills and fever.  Respiratory: Negative for shortness of breath.   Cardiovascular: Negative for chest pain.  Gastrointestinal: Positive for abdominal pain (chronic, unchanged). Negative for blood in stool, constipation, diarrhea, nausea and vomiting.  Genitourinary: Positive for difficulty urinating (hesitancy), dysuria, hematuria and urgency. Negative for discharge, frequency, genital sores, penile pain, scrotal swelling and testicular pain.       No dribbling or weak stream  Musculoskeletal: Negative for arthralgias and myalgias.  Skin: Negative for color change.  Allergic/Immunologic: Positive for immunocompromised state (Cancer).  Neurological: Negative for weakness and numbness.  Hematological: Does not bruise/bleed easily.  Psychiatric/Behavioral: Negative for confusion.   All other systems reviewed and are negative for acute change except as noted in the HPI.    Physical Exam Updated Vital Signs BP 100/68 (BP Location: Right Arm)   Pulse (!) 118   Temp 98.5 F (36.9 C) (Oral)   Resp 18   Ht 5\' 9"  (1.753 m)   Wt 83.9 kg (185 lb)   SpO2 96%   BMI 27.32 kg/m   Physical Exam  Constitutional: He is oriented to person, place, and time. He appears well-developed.  Non-toxic appearance. No distress.  Afebrile, nontoxic, NAD, appears much older than stated age  HENT:  Head: Normocephalic and atraumatic.  Mouth/Throat: Oropharynx is clear and moist. Mucous membranes are dry.  Dry lips and mucous membranes  Eyes: Conjunctivae and EOM are normal. Right eye exhibits no discharge. Left eye exhibits no discharge.  Neck:  Normal range of motion. Neck supple.  Cardiovascular: Regular rhythm, normal heart sounds and intact distal pulses.  Tachycardia present.  Exam reveals no gallop and no friction rub.   No murmur heard. Tachycardic in the 100-110s similar to prior visits, reg rhythm, nl s1/s2, no m/r/g, distal pulses intact, trace b/l pretibial pedal edema   Pulmonary/Chest: Effort normal and breath sounds normal. No respiratory distress. He has no decreased breath sounds. He has no wheezes. He has no rhonchi. He has no rales.  Slightly shallow respirations, but overall CTAB in all lung fields, no  w/r/r, no hypoxia or increased WOB, speaking in full sentences, SpO2 96% on RA   Abdominal: Soft. Bowel sounds are normal. He exhibits distension. There is generalized tenderness. There is no rigidity, no rebound, no guarding, no CVA tenderness, no tenderness at McBurney's point and negative Murphy's sign.  Soft, mildly distended, +BS throughout, with mild diffuse abd TTP throughout, no r/g/r, neg murphy's, neg mcburney's, no CVA TTP   Musculoskeletal: Normal range of motion.  Neurological: He is alert and oriented to person, place, and time. He has normal strength. No sensory deficit.  Skin: Skin is warm, dry and intact. No rash noted.  Psychiatric: He has a normal mood and affect.  Nursing note and vitals reviewed.    ED Treatments / Results  Labs (all labs ordered are listed, but only abnormal results are displayed) Labs Reviewed  URINALYSIS, ROUTINE W REFLEX MICROSCOPIC - Abnormal; Notable for the following:       Result Value   Color, Urine RED (*)    APPearance CLOUDY (*)    Specific Gravity, Urine >1.046 (*)    Glucose, UA 50 (*)    Hgb urine dipstick LARGE (*)    Protein, ur 100 (*)    Leukocytes, UA TRACE (*)    Bacteria, UA RARE (*)    All other components within normal limits  CBC WITH DIFFERENTIAL/PLATELET - Abnormal; Notable for the following:    WBC 18.5 (*)    RBC 2.71 (*)    Hemoglobin 7.5  (*)    HCT 23.5 (*)    RDW 15.9 (*)    Platelets 84 (*)    Neutro Abs 14.0 (*)    Monocytes Absolute 1.9 (*)    All other components within normal limits  COMPREHENSIVE METABOLIC PANEL - Abnormal; Notable for the following:    Sodium 133 (*)    Chloride 99 (*)    Glucose, Bld 198 (*)    Calcium 7.7 (*)    Total Protein 5.8 (*)    Albumin 1.5 (*)    AST 10 (*)    ALT 7 (*)    All other components within normal limits  PROTIME-INR - Abnormal; Notable for the following:    Prothrombin Time 17.7 (*)    All other components within normal limits  APTT - Abnormal; Notable for the following:    aPTT 40 (*)    All other components within normal limits  URINE CULTURE  LIPASE, BLOOD    EKG  EKG Interpretation None       Radiology Ct Chest W Contrast  Result Date: 09/04/2016 CLINICAL DATA:  56 y/o  M; metastatic renal cancer. EXAM: CT CHEST, ABDOMEN, AND PELVIS WITH CONTRAST TECHNIQUE: Multidetector CT imaging of the chest, abdomen and pelvis was performed following the standard protocol during bolus administration of intravenous contrast. CONTRAST:  147mL ISOVUE-300 IOPAMIDOL (ISOVUE-300) INJECTION 61% COMPARISON:  CT of chest, abdomen, and pelvis dated 06/18/2016. FINDINGS: CT CHEST FINDINGS Cardiovascular: Normal heart size. No pericardial effusion. Normal caliber thoracic aorta. Right port catheter tip projects over the right cavoatrial junction. Mediastinum/Nodes: Right upper peritracheal lymph node measures 14 x 16 mm (series 2, image 13). Right lower paratracheal lymphadenopathy measuring 16 x 27 mm (series 2, image 19). Lungs/Pleura: Marked increase in extensive right hemithorax pleural metastatic disease. Pulmonary nodules: 1. Right upper lobe, 10 mm, series 3, image 53. 2. Left lower lobe, 5 mm, series 3, image 53. 3. Left lower lobe, 11 mm, series 3, image 89. 4. Left lower  lobe, 7 mm, series 3, image 96. Musculoskeletal: The scapula and L1 vertebral body sclerotic lesions are  stable. CT ABDOMEN PELVIS FINDINGS Hepatobiliary: No focal liver lesion. Cholelithiasis. No intra or extrahepatic biliary ductal dilatation. Pancreas: Unremarkable. No pancreatic ductal dilatation or surrounding inflammatory changes. Spleen: Normal in size without focal abnormality. Adrenals/Urinary Tract: Ill-defined mass within the left kidney interpolar region is poorly marginated spanning approximately 5.8 cm. No right kidney lesion identified. Normal adrenal glands. No hydronephrosis. Normal urinary bladder. Stomach/Bowel: Stomach is within normal limits. Appendix appears normal. No evidence of bowel wall thickening, distention, or inflammatory changes. Scattered diverticulosis of sigmoid colon. Vascular/Lymphatic: 26 x 16 mm lymphadenopathy near left renal hilum just above ligament of Treitz. No vascular abnormality. Reproductive: Prostate is unremarkable. Other: No abdominal wall hernia or abnormality. No abdominopelvic ascites. Musculoskeletal: Multiple stable sclerotic lesions in the bones, predominantly within the pelvis, and additional prominent lesion in the left scapula and L1 vertebral body. IMPRESSION: 1. Marked progression of right hemithorax pleural metastatic disease. 2. Increased in size and number of multiple pulmonary nodules, likely metastasis. 3. Increased size of left renal mass. 4. Increased right paratracheal adenopathy. Increased size of adenopathy in the abdomen superior to ligament of Treitz. 5. Multiple stable sclerotic bone lesions, likely representing metastasis. Electronically Signed   By: Kristine Garbe M.D.   On: 09/04/2016 15:09   Ct Abdomen Pelvis W Contrast  Result Date: 09/04/2016 CLINICAL DATA:  56 y/o  M; metastatic renal cancer. EXAM: CT CHEST, ABDOMEN, AND PELVIS WITH CONTRAST TECHNIQUE: Multidetector CT imaging of the chest, abdomen and pelvis was performed following the standard protocol during bolus administration of intravenous contrast. CONTRAST:  135mL  ISOVUE-300 IOPAMIDOL (ISOVUE-300) INJECTION 61% COMPARISON:  CT of chest, abdomen, and pelvis dated 06/18/2016. FINDINGS: CT CHEST FINDINGS Cardiovascular: Normal heart size. No pericardial effusion. Normal caliber thoracic aorta. Right port catheter tip projects over the right cavoatrial junction. Mediastinum/Nodes: Right upper peritracheal lymph node measures 14 x 16 mm (series 2, image 13). Right lower paratracheal lymphadenopathy measuring 16 x 27 mm (series 2, image 19). Lungs/Pleura: Marked increase in extensive right hemithorax pleural metastatic disease. Pulmonary nodules: 1. Right upper lobe, 10 mm, series 3, image 53. 2. Left lower lobe, 5 mm, series 3, image 53. 3. Left lower lobe, 11 mm, series 3, image 89. 4. Left lower lobe, 7 mm, series 3, image 96. Musculoskeletal: The scapula and L1 vertebral body sclerotic lesions are stable. CT ABDOMEN PELVIS FINDINGS Hepatobiliary: No focal liver lesion. Cholelithiasis. No intra or extrahepatic biliary ductal dilatation. Pancreas: Unremarkable. No pancreatic ductal dilatation or surrounding inflammatory changes. Spleen: Normal in size without focal abnormality. Adrenals/Urinary Tract: Ill-defined mass within the left kidney interpolar region is poorly marginated spanning approximately 5.8 cm. No right kidney lesion identified. Normal adrenal glands. No hydronephrosis. Normal urinary bladder. Stomach/Bowel: Stomach is within normal limits. Appendix appears normal. No evidence of bowel wall thickening, distention, or inflammatory changes. Scattered diverticulosis of sigmoid colon. Vascular/Lymphatic: 26 x 16 mm lymphadenopathy near left renal hilum just above ligament of Treitz. No vascular abnormality. Reproductive: Prostate is unremarkable. Other: No abdominal wall hernia or abnormality. No abdominopelvic ascites. Musculoskeletal: Multiple stable sclerotic lesions in the bones, predominantly within the pelvis, and additional prominent lesion in the left scapula  and L1 vertebral body. IMPRESSION: 1. Marked progression of right hemithorax pleural metastatic disease. 2. Increased in size and number of multiple pulmonary nodules, likely metastasis. 3. Increased size of left renal mass. 4. Increased right paratracheal adenopathy. Increased size of  adenopathy in the abdomen superior to ligament of Treitz. 5. Multiple stable sclerotic bone lesions, likely representing metastasis. Electronically Signed   By: Kristine Garbe M.D.   On: 09/04/2016 15:09    Procedures Procedures (including critical care time)  Medications Ordered in ED Medications  iopamidol (ISOVUE-300) 61 % injection (not administered)  cephALEXin (KEFLEX) capsule 500 mg (not administered)  sodium chloride 0.9 % bolus 500 mL (0 mLs Intravenous Stopped 09/04/16 1705)  morphine 4 MG/ML injection 4 mg (4 mg Intravenous Given 09/04/16 1447)  iopamidol (ISOVUE-300) 61 % injection 100 mL (100 mLs Intravenous Contrast Given 09/04/16 1430)  sodium chloride 0.9 % bolus 500 mL (0 mLs Intravenous Stopped 09/04/16 1855)  morphine 4 MG/ML injection 4 mg (4 mg Intravenous Given 09/04/16 1719)  chlorproMAZINE (THORAZINE) tablet 50 mg (50 mg Oral Given 09/04/16 2039)  morphine 4 MG/ML injection 4 mg (4 mg Intravenous Given 09/04/16 2041)     Initial Impression / Assessment and Plan / ED Course  I have reviewed the triage vital signs and the nursing notes.  Pertinent labs & imaging results that were available during my care of the patient were reviewed by me and considered in my medical decision making (see chart for details).     56 y.o. male here with hematuria w/ clot x1 at noon, dysuria with that episode; some urinary hesitancy x1 wk but states once stream starts it's fine; some urinary urgency as well. Has renal cancer with pulm mets, and is scheduled for restarting chemo tomorrow; is also scheduled for CT chest w/contrast and CT abd/pelv w/contrast on 09/09/16. On exam, diffuse mild abd TTP, mildly  distended abdomen but adequate bowel sounds, nonperitoneal. Mildly tachycardic which is the same as always, dry lips. Appears mildly dehydrated, although has trace b/l pretibial edema. Bladder scan with 167ml, will give gentle hydration so as not to fluid overload pt, but help encouraged urination. Will give pain meds, get labs, and get CT abd/pelv as well as get the CT chest since he's already here and getting contrast, so we might as well get him both scans now so as to save him from coming back 09/09/16; will reassess after work up completed. Discussed case with my attending Dr. Dayna Barker who agrees with plan.   4:25 PM Still hasn't been able to provide U/A sample; will repeat bladder scan, and perhaps consider the need for cath/flushing bladder. CBC w/diff showing leukocytosis similar to all prior labs, and chronic anemia which is stable; also with thrombocytopenia which has trended down from 2 days ago (118-->84). CMP with mild hyperglycemia without anion gap, hypoalbuminemia and low protein. Lipase WNL. PT/INR slightly elevated but similar to prior values. APTT slightly high but similar to prior values. CT chest/abd/pelv showing marked progression of R hemithorax pleural metastatic disease, increased size and number of pulm nodules, increased side of L renal mass, increased R paratracheal adenopathy and adenopathy in abdomen, as well as multiple stable sclerotic bony lesions likely representing mets. No acute findings otherwise. Will await for U/A and bladder scan to determine next steps.   5:06 PM Repeat bladder scan with only 156ml reported. Will give a bit more fluids to see if we can get him to spontaneously urinate without foley. Will also PO hydrate. Pt feeling better, initially states he doesn't want more pain meds, but then states he would like some; will reorder morphine 4mg . Will reassess shortly  8:25 PM Pt was finally able to provide urine sample; on visual examination, appears to be  bright  red blood with tiny flecks of floating ?tissue but no obvious clots; no dark red blood or clots. Somewhat malodorous. Given what the urine looks like, doubt need to irrigate the bladder at this time; will await U/A results and proceed from there. Of note, pt requesting more pain medication, and thorazine dose for his hiccups. Will reassess shortly  9:18 PM U/A with trace leuks, TNTC RBC, 6-30 WBCs, no squamous cells, and rare bacteria. I doubt that there genuinely is rare bacteria given 6-30 WBCs and lack of contamination of sample, it's likely that they're just obscured from the blood. Will treat as UTI, UCx in process. Will give first dose of abx here. Advised pt to stay hydrated, use home pain meds, and f/up with his oncologist tomorrow to discuss today's visit and results from today's scans, as well as for f/up of the hematuria. Strict return precautions advised. I explained the diagnosis and have given explicit precautions to return to the ER including for any other new or worsening symptoms. The patient understands and accepts the medical plan as it's been dictated and I have answered their questions. Discharge instructions concerning home care and prescriptions have been given. The patient is STABLE and is discharged to home in good condition.    Final Clinical Impressions(s) / ED Diagnoses   Final diagnoses:  Gross hematuria  Tachycardia  Chronic abdominal pain  Chronic anemia  Leukocytosis, unspecified type  Type 2 diabetes mellitus with hyperglycemia, with long-term current use of insulin (HCC)  Hypoalbuminemia  Metastatic cancer (HCC)  Thrombocytopenia (HCC)  Lower urinary tract infectious disease    New Prescriptions New Prescriptions   CEPHALEXIN (KEFLEX) 500 MG CAPSULE    Take 1 capsule (500 mg total) by mouth 2 (two) times daily. x 7 days     [address] [place], [place] 09/04/16 2131    Mesner, Corene Cornea, MD 09/05/16 [phone]

## 2016-09-04 NOTE — ED Notes (Signed)
PT STATES HE HAS NO URGE TO URINATE AFTER 2ND 500 BOLUS. TOLERATES PO INTAKE. BLADDER SCANNING REVEALS 169ML.

## 2016-09-04 NOTE — ED Notes (Signed)
BLOOD DRAWN BY [place]

## 2016-09-04 NOTE — ED Notes (Signed)
Patient stated that he attempted to void again, but is still unable to void. Urinal at bedside.

## 2016-09-04 NOTE — Telephone Encounter (Signed)
Received call from wife reporting that pt passes blood with voiding.  Wife wanted to know what she should do.   Called wife back, and was informed that she is en route with pt to ER now. Wife's    Phone     [phone].

## 2016-09-04 NOTE — Discharge Instructions (Signed)
Stay very well hydrated with plenty of water throughout the day. Take antibiotic until completed. Use your home pain medications as needed for pain. Follow up with your oncologist tomorrow to discuss the results of today's scans, and for recheck of your urine symptoms as well as ongoing management of your other ongoing symptoms. Return to ER for emergent changing or worsening of symptoms. Please seek immediate care if you develop the following: You develop back pain.  Your symptoms are no better, or worse in 3 days. There is severe back pain or lower abdominal pain.  You develop chills.  You have a fever.  There is nausea or vomiting.  There is continued burning or discomfort with urination.

## 2016-09-04 NOTE — ED Triage Notes (Signed)
Patient has kidney cancer and reports that started having blood in urine today and pain with urination as well as hesitancy

## 2016-09-04 NOTE — ED Notes (Signed)
Asked patient for a urine sample and he stated he had been trying but was unable to void. Urinal at bedside.

## 2016-09-04 NOTE — ED Notes (Signed)
Patient transported to CT. CT WILL ACCESS PORT, DRAW BLOOD

## 2016-09-05 ENCOUNTER — Telehealth: Payer: Self-pay | Admitting: *Deleted

## 2016-09-05 ENCOUNTER — Encounter: Payer: Self-pay | Admitting: *Deleted

## 2016-09-05 ENCOUNTER — Ambulatory Visit (HOSPITAL_BASED_OUTPATIENT_CLINIC_OR_DEPARTMENT_OTHER): Payer: Medicaid Other

## 2016-09-05 VITALS — BP 92/59 | HR 102 | Temp 97.8°F | Resp 16 | Ht 69.0 in

## 2016-09-05 DIAGNOSIS — C642 Malignant neoplasm of left kidney, except renal pelvis: Secondary | ICD-10-CM | POA: Diagnosis not present

## 2016-09-05 DIAGNOSIS — Z5112 Encounter for antineoplastic immunotherapy: Secondary | ICD-10-CM

## 2016-09-05 DIAGNOSIS — C659 Malignant neoplasm of unspecified renal pelvis: Secondary | ICD-10-CM

## 2016-09-05 MED ORDER — SODIUM CHLORIDE 0.9 % IV SOLN
Freq: Once | INTRAVENOUS | Status: AC
  Administered 2016-09-05: 09:00:00 via INTRAVENOUS

## 2016-09-05 MED ORDER — HEPARIN SOD (PORK) LOCK FLUSH 100 UNIT/ML IV SOLN
500.0000 [IU] | Freq: Once | INTRAVENOUS | Status: AC | PRN
  Administered 2016-09-05: 500 [IU]
  Filled 2016-09-05: qty 5

## 2016-09-05 MED ORDER — SODIUM CHLORIDE 0.9% FLUSH
10.0000 mL | INTRAVENOUS | Status: DC | PRN
  Administered 2016-09-05: 10 mL
  Filled 2016-09-05: qty 10

## 2016-09-05 MED ORDER — PEMBROLIZUMAB CHEMO INJECTION 100 MG/4ML
200.0000 mg | Freq: Once | INTRAVENOUS | Status: AC
  Administered 2016-09-05: 200 mg via INTRAVENOUS
  Filled 2016-09-05: qty 8

## 2016-09-05 NOTE — Telephone Encounter (Signed)
Spoke with wife vernette, unable to schedule P.T. At home, d/t medicaid for insurance, and it does not cover P.T. She will contact [facility] P[facility]T. On [address] to inquire re: cost for out of pocket.

## 2016-09-05 NOTE — Telephone Encounter (Signed)
Called [facility], asked why patient has not been contacted for P.T. Evaluation? Caller apologized that no one had contacted [place]. P.T. Denied d/t patient has medicaid. Message left with tim henderson at [facility] They may provide a visit from a nurse to do a safety evaluation, for medicaid patients. Ct scan scheduled for 09/09/16 cancelled, patient had Ct done in the E.R. This week. Patient has labs and 5 hour appt at [facility] if transfusion needed, on 09/09/16. Wife notified and given appt date and time for lab and possible blood transfusion, at [facility].

## 2016-09-05 NOTE — Patient Instructions (Signed)
Pembrolizumab injection  What is this medicine?  PEMBROLIZUMAB (pem broe liz ue mab) is a monoclonal antibody. It is used to treat melanoma, head and neck cancer, Hodgkin lymphoma, non-small cell lung cancer, urothelial cancer, stomach cancer, and cancers that have a certain genetic condition.  This medicine may be used for other purposes; ask your health care provider or pharmacist if you have questions.  COMMON BRAND NAME(S): Keytruda  What should I tell my health care provider before I take this medicine?  They need to know if you have any of these conditions:  -diabetes  -immune system problems  -inflammatory bowel disease  -liver disease  -lung or breathing disease  -lupus  -organ transplant  -an unusual or allergic reaction to pembrolizumab, other medicines, foods, dyes, or preservatives  -pregnant or trying to get pregnant  -breast-feeding  How should I use this medicine?  This medicine is for infusion into a vein. It is given by a health care professional in a hospital or clinic setting.  A special MedGuide will be given to you before each treatment. Be sure to read this information carefully each time.  Talk to your pediatrician regarding the use of this medicine in children. While this drug may be prescribed for selected conditions, precautions do apply.  Overdosage: If you think you have taken too much of this medicine contact a poison control center or emergency room at once.  NOTE: This medicine is only for you. Do not share this medicine with others.  What if I miss a dose?  It is important not to miss your dose. Call your doctor or health care professional if you are unable to keep an appointment.  What may interact with this medicine?  Interactions have not been studied.  Give your health care provider a list of all the medicines, herbs, non-prescription drugs, or dietary supplements you use. Also tell them if you smoke, drink alcohol, or use illegal drugs. Some items may interact with your  medicine.  This list may not describe all possible interactions. Give your health care provider a list of all the medicines, herbs, non-prescription drugs, or dietary supplements you use. Also tell them if you smoke, drink alcohol, or use illegal drugs. Some items may interact with your medicine.  What should I watch for while using this medicine?  Your condition will be monitored carefully while you are receiving this medicine.  You may need blood work done while you are taking this medicine.  Do not become pregnant while taking this medicine or for 4 months after stopping it. Women should inform their doctor if they wish to become pregnant or think they might be pregnant. There is a potential for serious side effects to an unborn child. Talk to your health care professional or pharmacist for more information. Do not breast-feed an infant while taking this medicine or for 4 months after the last dose.  What side effects may I notice from receiving this medicine?  Side effects that you should report to your doctor or health care professional as soon as possible:  -allergic reactions like skin rash, itching or hives, swelling of the face, lips, or tongue  -bloody or black, tarry  -breathing problems  -changes in vision  -chest pain  -chills  -constipation  -cough  -dizziness or feeling faint or lightheaded  -fast or irregular heartbeat  -fever  -flushing  -hair loss  -low blood counts - this medicine may decrease the number of white blood cells, red blood cells   and platelets. You may be at increased risk for infections and bleeding.  -muscle pain  -muscle weakness  -persistent headache  -signs and symptoms of high blood sugar such as dizziness; dry mouth; dry skin; fruity breath; nausea; stomach pain; increased hunger or thirst; increased urination  -signs and symptoms of kidney injury like trouble passing urine or change in the amount of urine  -signs and symptoms of liver injury like dark urine, light-colored  stools, loss of appetite, nausea, right upper belly pain, yellowing of the eyes or skin  -stomach pain  -sweating  -weight loss  Side effects that usually do not require medical attention (report to your doctor or health care professional if they continue or are bothersome):  -decreased appetite  -diarrhea  -tiredness  This list may not describe all possible side effects. Call your doctor for medical advice about side effects. You may report side effects to FDA at 1-800-FDA-1088.  Where should I keep my medicine?  This drug is given in a hospital or clinic and will not be stored at home.  NOTE: This sheet is a summary. It may not cover all possible information. If you have questions about this medicine, talk to your doctor, pharmacist, or health care provider.   2018 Elsevier/Gold Standard (2015-11-13 12:29:36)

## 2016-09-05 NOTE — Progress Notes (Signed)
Per Dr Alen Blew it is okay to treat pt today with [place] and yesterdays labs done in ED.

## 2016-09-06 LAB — URINE CULTURE: CULTURE: NO GROWTH

## 2016-09-08 ENCOUNTER — Telehealth: Payer: Self-pay

## 2016-09-08 ENCOUNTER — Telehealth: Payer: Self-pay | Admitting: *Deleted

## 2016-09-08 NOTE — Telephone Encounter (Addendum)
Wife called stating that he was unable to sleep last night. This am he is talking that he is tired and cannot do this any more. He is on the 12 hour morphine, 30 mg. He was talking to the remote control thinking he was on the phone. He is taking his keflex for UTI dx in ED on 7/19 and his urine is still red. Is the morphine what is making him confused or is it the lack of sleep, or UTI? His mom is thinking the morphine is too strong for him.  He is using oxycodone in between. Can he get something to help him sleep? Can the clonazepam mix well with morphine? He is not using clonazepam very often. Does he need to change his pain medication regimen? He is still swollen in hands, feet, and abdomen.  Next appt; possible blood 7/24 Next Dr Alen Blew is 8/9

## 2016-09-08 NOTE — Telephone Encounter (Signed)
L/m for patient to call me  

## 2016-09-09 ENCOUNTER — Other Ambulatory Visit: Payer: Self-pay | Admitting: *Deleted

## 2016-09-09 ENCOUNTER — Ambulatory Visit (HOSPITAL_COMMUNITY)
Admission: RE | Admit: 2016-09-09 | Discharge: 2016-09-09 | Disposition: A | Discharge status: Home / Self Care | Payer: Medicaid Other | Source: Ambulatory Visit | Attending: Oncology | Admitting: Oncology

## 2016-09-09 ENCOUNTER — Other Ambulatory Visit (HOSPITAL_BASED_OUTPATIENT_CLINIC_OR_DEPARTMENT_OTHER): Payer: Medicaid Other

## 2016-09-09 ENCOUNTER — Ambulatory Visit (HOSPITAL_COMMUNITY): Payer: Medicaid Other

## 2016-09-09 DIAGNOSIS — C642 Malignant neoplasm of left kidney, except renal pelvis: Secondary | ICD-10-CM | POA: Diagnosis not present

## 2016-09-09 DIAGNOSIS — C659 Malignant neoplasm of unspecified renal pelvis: Secondary | ICD-10-CM | POA: Diagnosis not present

## 2016-09-09 DIAGNOSIS — D649 Anemia, unspecified: Secondary | ICD-10-CM

## 2016-09-09 DIAGNOSIS — Z79899 Other long term (current) drug therapy: Secondary | ICD-10-CM | POA: Diagnosis not present

## 2016-09-09 LAB — CBC WITH DIFFERENTIAL/PLATELET
BASO%: 0.1 % (ref 0.0–2.0)
Basophils Absolute: 0 10*3/uL (ref 0.0–0.1)
EOS ABS: 0 10*3/uL (ref 0.0–0.5)
EOS%: 0.1 % (ref 0.0–7.0)
HEMATOCRIT: 20.8 % — AB (ref 38.4–49.9)
HEMOGLOBIN: 6.3 g/dL — AB (ref 13.0–17.1)
LYMPH#: 2.3 10*3/uL (ref 0.9–3.3)
LYMPH%: 11.7 % — AB (ref 14.0–49.0)
MCH: 27.2 pg (ref 27.2–33.4)
MCHC: 30.3 g/dL — ABNORMAL LOW (ref 32.0–36.0)
MCV: 89.7 fL (ref 79.3–98.0)
MONO#: 2.3 10*3/uL — AB (ref 0.1–0.9)
MONO%: 11.6 % (ref 0.0–14.0)
NEUT%: 76.5 % — ABNORMAL HIGH (ref 39.0–75.0)
NEUTROS ABS: 15 10*3/uL — AB (ref 1.5–6.5)
PLATELETS: 132 10*3/uL — AB (ref 140–400)
RBC: 2.32 10*6/uL — AB (ref 4.20–5.82)
RDW: 15.8 % — ABNORMAL HIGH (ref 11.0–14.6)
WBC: 19.6 10*3/uL — AB (ref 4.0–10.3)

## 2016-09-09 LAB — COMPREHENSIVE METABOLIC PANEL
ALBUMIN: 1.2 g/dL — AB (ref 3.5–5.0)
ALK PHOS: 132 U/L (ref 40–150)
ALT: <6 U/L (ref 0–55)
ANION GAP: 9 meq/L (ref 3–11)
AST: 4 U/L — ABNORMAL LOW (ref 5–34)
BILIRUBIN TOTAL: 0.59 mg/dL (ref 0.20–1.20)
BUN: 15.7 mg/dL (ref 7.0–26.0)
CALCIUM: 8.1 mg/dL — AB (ref 8.4–10.4)
CO2: 26 mEq/L (ref 22–29)
Chloride: 101 mEq/L (ref 98–109)
Creatinine: 0.8 mg/dL (ref 0.7–1.3)
Glucose: 138 mg/dl (ref 70–140)
Potassium: 4.5 mEq/L (ref 3.5–5.1)
Sodium: 136 mEq/L (ref 136–145)
TOTAL PROTEIN: 6 g/dL — AB (ref 6.4–8.3)

## 2016-09-09 LAB — PREPARE RBC (CROSSMATCH)

## 2016-09-09 LAB — TECHNOLOGIST REVIEW

## 2016-09-09 LAB — TSH: TSH: 3.022 m[IU]/L (ref 0.320–4.118)

## 2016-09-09 MED ORDER — DIPHENHYDRAMINE HCL 25 MG PO CAPS
25.0000 mg | ORAL_CAPSULE | Freq: Once | ORAL | Status: AC
  Administered 2016-09-09: 25 mg via ORAL
  Filled 2016-09-09: qty 1

## 2016-09-09 MED ORDER — SODIUM CHLORIDE 0.9% FLUSH
10.0000 mL | INTRAVENOUS | Status: AC | PRN
  Administered 2016-09-09: 10 mL

## 2016-09-09 MED ORDER — HEPARIN SOD (PORK) LOCK FLUSH 100 UNIT/ML IV SOLN
500.0000 [IU] | Freq: Every day | INTRAVENOUS | Status: AC | PRN
  Administered 2016-09-09: 500 [IU]
  Filled 2016-09-09: qty 5

## 2016-09-09 MED ORDER — SODIUM CHLORIDE 0.9 % IV SOLN
250.0000 mL | Freq: Once | INTRAVENOUS | Status: AC
  Administered 2016-09-09: 250 mL via INTRAVENOUS

## 2016-09-09 MED ORDER — ACETAMINOPHEN 325 MG PO TABS
650.0000 mg | ORAL_TABLET | Freq: Once | ORAL | Status: AC
  Administered 2016-09-09: 650 mg via ORAL
  Filled 2016-09-09: qty 2

## 2016-09-09 NOTE — Discharge Instructions (Signed)

## 2016-09-09 NOTE — Progress Notes (Signed)
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 Alen Blew  Procedure: Pt received 2 units of PRBCs via porta cath.  Pt tolerated procedure well.  Post procedure: Pt alert, oriented and assisted to wheelchair. D/C instructions given to him with verbal understanding.

## 2016-09-10 LAB — BPAM RBC
BLOOD PRODUCT EXPIRATION DATE: 201808172359
Blood Product Expiration Date: 201808172359
ISSUE DATE / TIME: 201807241115
ISSUE DATE / TIME: 201807241115
UNIT TYPE AND RH: 7300
Unit Type and Rh: 7300

## 2016-09-10 LAB — TYPE AND SCREEN
ABO/RH(D): B POS
ANTIBODY SCREEN: NEGATIVE
UNIT DIVISION: 0
Unit division: 0

## 2016-09-11 ENCOUNTER — Other Ambulatory Visit: Payer: Self-pay | Admitting: *Deleted

## 2016-09-11 MED ORDER — CEPHALEXIN 500 MG PO CAPS: 500.0000 mg | ORAL_CAPSULE | Freq: Two times a day (BID) | ORAL | 0 refills | Status: AC

## 2016-09-15 ENCOUNTER — Other Ambulatory Visit: Payer: Self-pay | Admitting: Oncology

## 2016-09-15 ENCOUNTER — Telehealth: Payer: Self-pay

## 2016-09-15 DIAGNOSIS — C659 Malignant neoplasm of unspecified renal pelvis: Secondary | ICD-10-CM

## 2016-09-15 NOTE — Telephone Encounter (Signed)
S/w wife per Dr Alen Blew attached note.

## 2016-09-15 NOTE — Telephone Encounter (Signed)
Wife called that pt was confused all weekend, forgetting stuff, edgy. Been going on for awhile but getting worse. Pt has lab 7/31. He will consult social worker downstairs tomorrow as well.  He is angry, he does not remember taking medications, he cannot dial the phone, he is talking on phone with no one there. He has short term memory loss. Pt is aware he is saying things wrong, and he can't think. He urinates 1x/day, no c/o pain, still a little bloody but clearing up.  Wife states he will not go to ER at present. She is mostly concerned if the labs tomorrow will show anything or if he needs a head scan.

## 2016-09-15 NOTE — Telephone Encounter (Signed)
We will arrange for MRI of the brain in the near future.

## 2016-09-16 ENCOUNTER — Telehealth: Payer: Self-pay

## 2016-09-16 ENCOUNTER — Other Ambulatory Visit (HOSPITAL_BASED_OUTPATIENT_CLINIC_OR_DEPARTMENT_OTHER): Payer: Medicaid Other

## 2016-09-16 ENCOUNTER — Other Ambulatory Visit: Payer: Self-pay | Admitting: *Deleted

## 2016-09-16 ENCOUNTER — Encounter: Payer: Self-pay | Admitting: Oncology

## 2016-09-16 DIAGNOSIS — C659 Malignant neoplasm of unspecified renal pelvis: Secondary | ICD-10-CM

## 2016-09-16 DIAGNOSIS — D649 Anemia, unspecified: Secondary | ICD-10-CM

## 2016-09-16 DIAGNOSIS — C642 Malignant neoplasm of left kidney, except renal pelvis: Secondary | ICD-10-CM

## 2016-09-16 LAB — COMPREHENSIVE METABOLIC PANEL
ALBUMIN: 1.1 g/dL — AB (ref 3.5–5.0)
ALK PHOS: 176 U/L — AB (ref 40–150)
ALT: <6 U/L (ref 0–55)
ANION GAP: 8 meq/L (ref 3–11)
AST: 7 U/L (ref 5–34)
BILIRUBIN TOTAL: 0.78 mg/dL (ref 0.20–1.20)
BUN: 22.8 mg/dL (ref 7.0–26.0)
CALCIUM: 8.2 mg/dL — AB (ref 8.4–10.4)
CO2: 26 mEq/L (ref 22–29)
Chloride: 103 mEq/L (ref 98–109)
Creatinine: 0.8 mg/dL (ref 0.7–1.3)
Glucose: 139 mg/dl (ref 70–140)
POTASSIUM: 4.8 meq/L (ref 3.5–5.1)
SODIUM: 137 meq/L (ref 136–145)
TOTAL PROTEIN: 5.8 g/dL — AB (ref 6.4–8.3)

## 2016-09-16 LAB — CBC WITH DIFFERENTIAL/PLATELET
BASO%: 0.3 % (ref 0.0–2.0)
BASOS ABS: 0.1 10*3/uL (ref 0.0–0.1)
EOS ABS: 0 10*3/uL (ref 0.0–0.5)
EOS%: 0.1 % (ref 0.0–7.0)
HCT: 25.4 % — ABNORMAL LOW (ref 38.4–49.9)
HEMOGLOBIN: 7.9 g/dL — AB (ref 13.0–17.1)
LYMPH%: 11.4 % — ABNORMAL LOW (ref 14.0–49.0)
MCH: 28.3 pg (ref 27.2–33.4)
MCHC: 31.3 g/dL — ABNORMAL LOW (ref 32.0–36.0)
MCV: 90.7 fL (ref 79.3–98.0)
MONO#: 1.6 10*3/uL — AB (ref 0.1–0.9)
MONO%: 9 % (ref 0.0–14.0)
NEUT%: 79.2 % — ABNORMAL HIGH (ref 39.0–75.0)
NEUTROS ABS: 13.8 10*3/uL — AB (ref 1.5–6.5)
PLATELETS: 129 10*3/uL — AB (ref 140–400)
RBC: 2.8 10*6/uL — ABNORMAL LOW (ref 4.20–5.82)
RDW: 15.8 % — AB (ref 11.0–14.6)
WBC: 17.4 10*3/uL — AB (ref 4.0–10.3)
lymph#: 2 10*3/uL (ref 0.9–3.3)

## 2016-09-16 LAB — PREPARE RBC (CROSSMATCH)

## 2016-09-16 MED ORDER — CLONAZEPAM 0.5 MG PO TABS: ORAL_TABLET | ORAL | 0 refills | Status: DC

## 2016-09-16 MED ORDER — MORPHINE SULFATE ER 30 MG PO TBCR: 30.0000 mg | EXTENDED_RELEASE_TABLET | Freq: Two times a day (BID) | ORAL | 0 refills | Status: DC

## 2016-09-16 NOTE — Telephone Encounter (Signed)
David [name] at [facility] called asking for clarification on CBG test strips. He needs to know the quantity and how many times per day the pt is to test.

## 2016-09-16 NOTE — Progress Notes (Signed)
Patient's spouse called to inform that patient's Medicaid will be ending today. She received a letter a while back and this is due to him now receiving disability. Asked her if she can bring in a copy at the next visit. She states she can. Advised her that they can apply for [facility] FAA with the letter stating he is no longer eligible for Medicaid as well as proof of income for both. She wanted to know if he would still be able to be treated. Advised her yes but I would reach out to Rob in drug replacement to advise and follow up. She verbalized understanding and has my name and number for any additional financial questions or concerns.  Called Rob to advise. Gave him all documents I had regarding drug replacement from last year before patient had Medicaid. Rob will reach out to spouse and advise her of what is needed next.

## 2016-09-17 ENCOUNTER — Encounter: Payer: Self-pay | Admitting: General Practice

## 2016-09-17 ENCOUNTER — Encounter (HOSPITAL_COMMUNITY): Payer: Self-pay

## 2016-09-17 NOTE — Progress Notes (Signed)
[facility] Note  LVM of availability and support at pt's home number (same as spouse Vernette's cell listed) per referral from Mahnomen Somers/LCSW, encouraging call back.   [place], [place], [facility] Pager [phone] Voicemail [phone]

## 2016-09-18 ENCOUNTER — Ambulatory Visit (HOSPITAL_COMMUNITY)
Admission: RE | Admit: 2016-09-18 | Discharge: 2016-09-18 | Disposition: A | Discharge status: Home / Self Care | Payer: Medicaid Other | Source: Ambulatory Visit | Attending: Oncology | Admitting: Oncology

## 2016-09-18 DIAGNOSIS — C659 Malignant neoplasm of unspecified renal pelvis: Secondary | ICD-10-CM | POA: Insufficient documentation

## 2016-09-18 MED ORDER — SODIUM CHLORIDE 0.9% FLUSH
10.0000 mL | INTRAVENOUS | Status: AC | PRN
  Administered 2016-09-18: 10 mL

## 2016-09-18 MED ORDER — DIPHENHYDRAMINE HCL 25 MG PO CAPS
25.0000 mg | ORAL_CAPSULE | Freq: Once | ORAL | Status: AC
  Administered 2016-09-18: 25 mg via ORAL
  Filled 2016-09-18: qty 1

## 2016-09-18 MED ORDER — ACETAMINOPHEN 325 MG PO TABS
650.0000 mg | ORAL_TABLET | Freq: Once | ORAL | Status: AC
  Administered 2016-09-18: 650 mg via ORAL
  Filled 2016-09-18: qty 2

## 2016-09-18 MED ORDER — SODIUM CHLORIDE 0.9% FLUSH: 10.0000 mL | INTRAVENOUS | Status: AC | PRN

## 2016-09-18 MED ORDER — HEPARIN SOD (PORK) LOCK FLUSH 100 UNIT/ML IV SOLN
500.0000 [IU] | Freq: Every day | INTRAVENOUS | Status: AC | PRN
  Administered 2016-09-18: 500 [IU]
  Filled 2016-09-18: qty 5

## 2016-09-18 MED ORDER — SODIUM CHLORIDE 0.9 % IV SOLN
250.0000 mL | Freq: Once | INTRAVENOUS | Status: AC
  Administered 2016-09-18: 250 mL via INTRAVENOUS

## 2016-09-18 NOTE — Discharge Instructions (Signed)

## 2016-09-18 NOTE — Progress Notes (Signed)
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 Alen Blew  Procedure: Pt received 2 units of PRBCs via porta cath.  Pt tolerated procedure well.  Post procedure: Pt alert, oriented and assisted to wheelchair. D/C instructions given to him and his wife with verbal understanding.

## 2016-09-19 ENCOUNTER — Telehealth: Payer: Self-pay | Admitting: Medical

## 2016-09-19 LAB — TYPE AND SCREEN
ABO/RH(D): B POS
Antibody Screen: NEGATIVE
Unit division: 0
Unit division: 0

## 2016-09-19 LAB — BPAM RBC
BLOOD PRODUCT EXPIRATION DATE: 201808202359
Blood Product Expiration Date: 201808202359
ISSUE DATE / TIME: 201808020810
ISSUE DATE / TIME: 201808020810
Unit Type and Rh: 7300
Unit Type and Rh: 7300

## 2016-09-19 NOTE — Telephone Encounter (Signed)
Is he able to swallow some warm fluids like warm tea?  Does he feel likes its an infection, throat pain, or does he feel swollen in back of throat or are lips/tongue swollen?

## 2016-09-19 NOTE — Telephone Encounter (Signed)
Called and spoke with pt 's wife , she said it like sore throat per shane he said for him to use  benadryl  And Advil to help with the pain and warm fuilds like tea and throat spray if not better by Monday make an appt.pt is already on cipro 500mg  bid.

## 2016-09-19 NOTE — Telephone Encounter (Signed)
Pt's wife called stating that pt says his throat hurts a little and feels as if it is swollen or closed some. He can not swallow food well. [facility] and she was told to let David [name] know or get the pt in to [facility] or an urgent care. Per wife pt does not want to be seen. He is too weak to go out. What can he do?

## 2016-09-22 ENCOUNTER — Telehealth: Payer: Self-pay | Admitting: *Deleted

## 2016-09-22 NOTE — Telephone Encounter (Signed)
Returned wife's phone call. She wanted Dr. Alen Blew to know,"David [name] still hurts on his right side, right shoulder and he is still passing blood but it's a lot less than last week. Tomorrow he completes his Keflex. He is taking Advil and Morphine for the right sided pain. We see Dr. Alen Blew on Thursday." Instructed wife that I would give this message to Dr. Alen Blew. If there is anything different Dr. Alen Blew wants to do, I will call her. She verbalized understanding.

## 2016-09-25 ENCOUNTER — Ambulatory Visit (HOSPITAL_BASED_OUTPATIENT_CLINIC_OR_DEPARTMENT_OTHER): Payer: Medicaid Other

## 2016-09-25 ENCOUNTER — Ambulatory Visit (HOSPITAL_BASED_OUTPATIENT_CLINIC_OR_DEPARTMENT_OTHER): Payer: Medicaid Other | Admitting: Oncology

## 2016-09-25 ENCOUNTER — Other Ambulatory Visit: Payer: Self-pay | Admitting: *Deleted

## 2016-09-25 ENCOUNTER — Encounter: Payer: Self-pay | Admitting: General Practice

## 2016-09-25 ENCOUNTER — Ambulatory Visit (HOSPITAL_COMMUNITY)
Admission: RE | Admit: 2016-09-25 | Discharge: 2016-09-25 | Disposition: A | Discharge status: Home / Self Care | Payer: Medicaid Other | Source: Ambulatory Visit | Attending: Oncology | Admitting: Oncology

## 2016-09-25 ENCOUNTER — Other Ambulatory Visit (HOSPITAL_BASED_OUTPATIENT_CLINIC_OR_DEPARTMENT_OTHER): Payer: Medicaid Other

## 2016-09-25 ENCOUNTER — Telehealth: Payer: Self-pay | Admitting: Oncology

## 2016-09-25 VITALS — BP 96/51 | HR 91 | Temp 98.5°F | Resp 19 | Ht 69.0 in | Wt 183.9 lb

## 2016-09-25 DIAGNOSIS — C7800 Secondary malignant neoplasm of unspecified lung: Secondary | ICD-10-CM

## 2016-09-25 DIAGNOSIS — D630 Anemia in neoplastic disease: Secondary | ICD-10-CM | POA: Diagnosis not present

## 2016-09-25 DIAGNOSIS — D649 Anemia, unspecified: Secondary | ICD-10-CM

## 2016-09-25 DIAGNOSIS — C659 Malignant neoplasm of unspecified renal pelvis: Secondary | ICD-10-CM | POA: Diagnosis not present

## 2016-09-25 DIAGNOSIS — C642 Malignant neoplasm of left kidney, except renal pelvis: Secondary | ICD-10-CM

## 2016-09-25 DIAGNOSIS — Z5112 Encounter for antineoplastic immunotherapy: Secondary | ICD-10-CM

## 2016-09-25 LAB — CBC WITH DIFFERENTIAL/PLATELET
BASO%: 0 % (ref 0.0–2.0)
Basophils Absolute: 0 10*3/uL (ref 0.0–0.1)
EOS ABS: 0 10*3/uL (ref 0.0–0.5)
EOS%: 0.1 % (ref 0.0–7.0)
HCT: 25.9 % — ABNORMAL LOW (ref 38.4–49.9)
HEMOGLOBIN: 7.8 g/dL — AB (ref 13.0–17.1)
LYMPH%: 10.4 % — ABNORMAL LOW (ref 14.0–49.0)
MCH: 28.4 pg (ref 27.2–33.4)
MCHC: 30.1 g/dL — ABNORMAL LOW (ref 32.0–36.0)
MCV: 94.2 fL (ref 79.3–98.0)
MONO#: 1 10*3/uL — AB (ref 0.1–0.9)
MONO%: 5.6 % (ref 0.0–14.0)
NEUT%: 83.9 % — ABNORMAL HIGH (ref 39.0–75.0)
NEUTROS ABS: 15.5 10*3/uL — AB (ref 1.5–6.5)
Platelets: 95 10*3/uL — ABNORMAL LOW (ref 140–400)
RBC: 2.75 10*6/uL — AB (ref 4.20–5.82)
RDW: 15.4 % — AB (ref 11.0–14.6)
WBC: 18.5 10*3/uL — AB (ref 4.0–10.3)
lymph#: 1.9 10*3/uL (ref 0.9–3.3)

## 2016-09-25 LAB — COMPREHENSIVE METABOLIC PANEL
ALBUMIN: 1 g/dL — AB (ref 3.5–5.0)
ALT: 8 U/L (ref 0–55)
ANION GAP: 7 meq/L (ref 3–11)
AST: 8 U/L (ref 5–34)
Alkaline Phosphatase: 180 U/L — ABNORMAL HIGH (ref 40–150)
BUN: 31.1 mg/dL — ABNORMAL HIGH (ref 7.0–26.0)
CHLORIDE: 105 meq/L (ref 98–109)
CO2: 26 meq/L (ref 22–29)
Calcium: 8.1 mg/dL — ABNORMAL LOW (ref 8.4–10.4)
Creatinine: 1 mg/dL (ref 0.7–1.3)
Glucose: 183 mg/dl — ABNORMAL HIGH (ref 70–140)
POTASSIUM: 4.9 meq/L (ref 3.5–5.1)
SODIUM: 138 meq/L (ref 136–145)
TOTAL PROTEIN: 5.7 g/dL — AB (ref 6.4–8.3)
Total Bilirubin: 0.77 mg/dL (ref 0.20–1.20)

## 2016-09-25 LAB — PREPARE RBC (CROSSMATCH)

## 2016-09-25 MED ORDER — OXYCODONE-ACETAMINOPHEN 5-325 MG PO TABS
ORAL_TABLET | ORAL | Status: AC
  Filled 2016-09-25: qty 1

## 2016-09-25 MED ORDER — SODIUM CHLORIDE 0.9 % IV SOLN
Freq: Once | INTRAVENOUS | Status: AC
  Administered 2016-09-25: 14:00:00 via INTRAVENOUS

## 2016-09-25 MED ORDER — HEPARIN SOD (PORK) LOCK FLUSH 100 UNIT/ML IV SOLN
500.0000 [IU] | Freq: Once | INTRAVENOUS | Status: AC | PRN
  Administered 2016-09-25: 500 [IU]
  Filled 2016-09-25: qty 5

## 2016-09-25 MED ORDER — SODIUM CHLORIDE 0.9% FLUSH
10.0000 mL | INTRAVENOUS | Status: DC | PRN
  Administered 2016-09-25: 10 mL
  Filled 2016-09-25: qty 10

## 2016-09-25 MED ORDER — SODIUM CHLORIDE 0.9 % IV SOLN
200.0000 mg | Freq: Once | INTRAVENOUS | Status: AC
  Administered 2016-09-25: 200 mg via INTRAVENOUS
  Filled 2016-09-25: qty 8

## 2016-09-25 MED ORDER — SENNOSIDES-DOCUSATE SODIUM 8.6-50 MG PO TABS: 2.0000 | ORAL_TABLET | Freq: Two times a day (BID) | ORAL | 3 refills | Status: AC

## 2016-09-25 MED ORDER — OXYCODONE-ACETAMINOPHEN 5-325 MG PO TABS
1.0000 | ORAL_TABLET | Freq: Once | ORAL | Status: AC
  Administered 2016-09-25: 1 via ORAL

## 2016-09-25 MED ORDER — ONDANSETRON HCL 8 MG PO TABS
ORAL_TABLET | ORAL | Status: AC
  Filled 2016-09-25: qty 1

## 2016-09-25 MED ORDER — ONDANSETRON HCL 8 MG PO TABS
8.0000 mg | ORAL_TABLET | Freq: Once | ORAL | Status: AC
  Administered 2016-09-25: 8 mg via ORAL

## 2016-09-25 MED ORDER — LIDOCAINE-PRILOCAINE 2.5-2.5 % EX CREA: 1.0000 "application " | TOPICAL_CREAM | CUTANEOUS | 2 refills | Status: DC | PRN

## 2016-09-25 NOTE — Progress Notes (Signed)
Per Dr. Alen Blew okay to tx with Hgb 7.8 and Plt 95. Pt to get blood transfusion tomorrow in sickle cell.   Pt c/o pain and nausea. Sometimes both become worse with treatment and make it harder to eat at home. 5mg  oxycodone PO and 8mg  zofran PO ordered per Dr. Alen Blew.

## 2016-09-25 NOTE — Progress Notes (Signed)
[facility] Note  Met David [name], his wife David [name], and son David [name]) in infusion to offer support.  David [name] remembered my call and was appreciative of f/u; she plans to call me when she is ready to talk in more detail.  David Nazari family is very involved with church and [employer], providing strong support.  Per spouse, they may value chaplain support, as well, as resource outside of family/church system.  Normalized feelings and desire for external support, especially support in oncological setting.  David [name] has my card and brochure.  Please also page if immediate needs arise.  Thank you.   [place], [place], [facility] Pager [phone] Voicemail [phone]

## 2016-09-25 NOTE — Telephone Encounter (Signed)
Scheduled appt per 8/9 los - Gave patient AVS and calender per los.  

## 2016-09-25 NOTE — Progress Notes (Signed)
Hematology and Oncology Follow Up Visit  David [name] [id number] 07/08/1960 56 y.o. 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 He presented with a 5 cm mass in the central left kidney as well as pulmonary metastasis, nodules and pleural effusion.   Prior Therapy:   Status post biopsy obtained on 10/12/2015 which confirmed the presence of carcinoma. 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  Chemotherapy with gemcitabine and carboplatin cycle one  on 10/30/2015. He is status post 8 cycles of therapy.   Current therapy: Pembrolizumab 200 mg every 3 weeks started on 06/26/2016. He is status post 3 cycles of therapy. He is here for evaluation before cycle 4.  Interim History: David [name] presents today for a follow-up visit. Since the last visit, he continues to overall decline in his health. He is predominantly chair and bed bound with very limited mobility. He is reporting generalized anasarca with increased lower extremity and upper extremity edema. His by mouth intake is poor and his quality of life is deteriorating.   He is drinking nutritional supplements 3 times a day but no other substantial intake. He continues to be on morphine twice a day although he uses Advil for breakthrough pain medication which is an adequate at times. He also has reported constipation.  He denied any headaches, blurry vision, syncope or seizures. He does report fevers but no chills.He does not report any chest pain, palpitation, orthopnea, leg edema or dyspnea on exertion. He denied any cough, wheezing. He does not report any nausea, vomiting or early satiety. He did report constipation which have resolved. He denied any diarrhea. He does not report any frequency, urgency or hesitancy. He does not report any skeletal complaints. Remaining review of system is unremarkable.   Medications:  I have reviewed the patient's current medications.  Current Outpatient Prescriptions  Medication Sig Dispense Refill  . carvedilol (COREG) 3.125 MG tablet take 1 tablet by mouth twice a day with meals 180 tablet 0  . chlorproMAZINE (THORAZINE) 50 MG tablet Take 1 tablet (50 mg total) by mouth 3 (three) times daily as needed for hiccoughs. 90 tablet 0  . clonazePAM (KLONOPIN) 0.5 MG tablet Take one tablet by mouth 1-2 times a day if needed for anxiety. 60 tablet 0  . feeding supplement, ENSURE ENLIVE, (ENSURE ENLIVE) LIQD Take 237 mLs by mouth 3 (three) times daily between meals. 237 mL 12  . glucose 4 GM chewable tablet Chew 1 tablet by mouth once as needed for low blood sugar.     . insulin glargine (LANTUS) 100 UNIT/ML injection Inject 10 Units into the skin at bedtime as needed (for blood sugar).     Marland Kitchen lidocaine-prilocaine (EMLA) cream Apply 1 application topically as needed. 30 g 2  . megestrol (MEGACE ES) 625 MG/5ML suspension take 5 milliliters by mouth daily (Patient not taking: Reported on 09/05/2016) 150 mL 0  . metFORMIN (GLUCOPHAGE) 500 MG tablet Take 1 tablet (500 mg total) by mouth 2 (two) times daily with a meal. (Patient not taking: Reported on 09/05/2016) 180 tablet 1  . mirtazapine (REMERON) 15 MG tablet Take 1 tablet (15 mg total) by mouth 2 (two) times daily. 60 tablet 11  . morphine (MS CONTIN) 30 MG 12 hr tablet Take 1 tablet (30 mg total) by mouth every 12 (twelve) hours. 60 tablet 0  . naproxen sodium (ANAPROX) 220 MG tablet Take 440 mg by  mouth 2 (two) times daily as needed (pain).    Marland Kitchen omeprazole (PRILOSEC) 20 MG capsule Take 20 mg by mouth daily as needed (for acid reflux).     Marland Kitchen oxyCODONE (OXY IR/ROXICODONE) 5 MG immediate release tablet Take 1 tablet (5 mg total) by mouth every 4 (four) hours as needed for severe pain. 60 tablet 0  . senna-docusate (SENNA S) 8.6-50 MG tablet Take 2 tablets by mouth 2 (two) times daily. 120 tablet 3  . testosterone cypionate  (DEPOTESTOSTERONE CYPIONATE) 200 MG/ML injection INJECT 1 ML INTRAMUSCULARLY EVERY 14 DAYS (Patient taking differently: INJECT 200 MG INTRAMUSCULARLY EVERY 14 DAYS) 10 mL 1  . vitamin B-12 (CYANOCOBALAMIN) 1000 MCG tablet Take 1,000 mcg by mouth daily.      No current facility-administered medications for this visit.      Allergies:  No Known Allergies  Past Medical History, Surgical history, Social history, and Family History were reviewed and updated.   Physical Exam: Blood pressure (!) 96/51, pulse 91, temperature 98.5 F (36.9 C), temperature source Oral, resp. rate 19, height 5\' 9"  (1.753 m), weight 183 lb 14.4 oz (83.4 kg), SpO2 97 %. ECOG: 3 General appearance: An ill-appearing gentleman appeared with some mild distress. Head: Normocephalic, without obvious abnormality no oral thrush. Neck: no adenopathy Lymph nodes: Cervical, supraclavicular, and axillary nodes normal. Heart:regular rate and rhythm, S1, S2 normal, no murmur, click, rub or gallop Lung:chest clear, no wheezing, rales, normal symmetric air entry Abdomin: soft, non-tender, without masses or organomegaly no rebound or guarding. EXT: 2+ edema noted bilaterally upper and lower extremities.   Lab Results: Lab Results  Component Value Date   WBC 18.5 (H) 09/25/2016   HGB 7.8 (L) 09/25/2016   HCT 25.9 (L) 09/25/2016   MCV 94.2 09/25/2016   PLT 95 (L) 09/25/2016     Chemistry      Component Value Date/Time   NA 138 09/25/2016 1247   K 4.9 09/25/2016 1247   CL 99 (L) 09/04/2016 1439   CO2 26 09/25/2016 1247   BUN 31.1 (H) 09/25/2016 1247   CREATININE 1.0 09/25/2016 1247      Component Value Date/Time   CALCIUM 8.1 (L) 09/25/2016 1247   ALKPHOS 180 (H) 09/25/2016 1247   AST 8 09/25/2016 1247   ALT 8 09/25/2016 1247   BILITOT 0.77 09/25/2016 1247      Impression and Plan:  56 year old gentleman with the following issues:  1. 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 He is status post biopsy of a pleural-based nodule which confirmed the presence of carcinoma although further immunohistochemical staining was not performed because of lack of tissue.  These findings confirm the presence of advanced malignancy arising from the renal pelvis.  He is S/Psalvage chemotherapy with carboplatin and gemcitabine. Chemotherapy has been well tolerated with dramatic improvement in his quality of life.  CT scan on 11/09/2017showed a positive response with decrease of the volume of disease at this time.  CT scan obtained on 03/03/2016 continues to show excellent response to therapy.   He completed 8 cycles of therapy with cycle 8 of chemotherapy was given with gemcitabine only.   CT scan obtained on 06/18/2016 showed progression of disease. He is developing pulmonary based metastasis with parenchymal and pleural based nodules. He is also symptomatic from his disease and salvage therapy is required.  He is S/P Pembrolizumab and completed 2 cycles of therapy. He was experiencing further decline  and opted to stop treatment at this time.  CT scan in July 2018 showed clear progression of disease. He opted to resume Pembrolizumab at that time. Biopsy obtained on 08/28/2016 confirmed the presence of poorly differentiated GU tumor. Foundation medicine testing did not show any actionable mutation.  His disease status was reviewed today with the patient and his family. He has clearly showed continuous decline in his clinical status and progression of disease based on imaging studies. I have recommended discontinuation of all treatments and proceeding with hospice only. He continues to be resistant to the fact and wants everything done. I explained to him that the likelihood of recovery from this disease status at this point is virtually 0.  For the time being, he will like to continue Pembrolizumab for another 6 weeks and then we will  consider repeat imaging studies. He understands his condition will likely continue to deteriorate and likely will not be able to receive any treatment and likely needs hospice sooner than later.   2. IV access: Port-A-Cath in place without complications and will continue to be used.  3. Nausea prophylaxis: No issues reported at this time. Antiemetics available to him at this time.  4. Anemia: Related to malignancy and chemotherapy infusion. Hemoglobin is down again and will require repeat transfusion. His anemia is likely related to malignancy as well as chronic blood loss from his active malignancy.  5. Hypercalcemia: His calcium has normalized.  6. Goals of care: His cancer is incurable and progressing. His prognosis is very poor with limited life expectancy likely in the weeks rather than months. He is having hard time accepting that although his family understands that this malignancy will eventually take over his body. The plan is to try to palliate his symptoms as much as possible.  7. Pain: I recommended resuming morphine long-acting pain medication at twice a day and use oxycodone as needed.  8. Constipation: Prescription for Senokot-S to be taken daily with laxative as needed.  9. Follow-up: Weekly basis for laboratory testing and in 3 weeks before the next treatment of Pembrolizumab.  Zola Button, MD 8/9/20181:37 PM

## 2016-09-25 NOTE — Patient Instructions (Signed)
[facility] Discharge Instructions for Patients Receiving Chemotherapy  Today you received the following chemotherapy agents pembrolizumab (Keytruda).  To help prevent nausea and vomiting after your treatment, we encourage you to take your nausea medication as directed by your doctor.   If you develop nausea and vomiting that is not controlled by your nausea medication, call the clinic.   BELOW ARE SYMPTOMS THAT SHOULD BE REPORTED IMMEDIATELY:  *FEVER GREATER THAN 100.5 F  *CHILLS WITH OR WITHOUT FEVER  NAUSEA AND VOMITING THAT IS NOT CONTROLLED WITH YOUR NAUSEA MEDICATION  *UNUSUAL SHORTNESS OF BREATH  *UNUSUAL BRUISING OR BLEEDING  TENDERNESS IN MOUTH AND THROAT WITH OR WITHOUT PRESENCE OF ULCERS  *URINARY PROBLEMS  *BOWEL PROBLEMS  UNUSUAL RASH Items with * indicate a potential emergency and should be followed up as soon as possible.  Feel free to call the clinic you have any questions or concerns. The clinic phone number is [phone].  Please show the CHEMO ALERT CARD at check-in to the Emergency Department and triage nurse.  

## 2016-09-26 ENCOUNTER — Telehealth: Payer: Self-pay | Admitting: *Deleted

## 2016-09-26 ENCOUNTER — Observation Stay (HOSPITAL_COMMUNITY)
Admission: EM | Admit: 2016-09-26 | Discharge: 2016-09-27 | Disposition: A | Discharge status: Home / Self Care | Payer: Medicaid Other | Attending: Internal Medicine | Admitting: Internal Medicine

## 2016-09-26 ENCOUNTER — Encounter (HOSPITAL_COMMUNITY): Payer: Self-pay | Admitting: Emergency Medicine

## 2016-09-26 ENCOUNTER — Other Ambulatory Visit: Payer: Self-pay

## 2016-09-26 ENCOUNTER — Inpatient Hospital Stay (HOSPITAL_COMMUNITY): Admission: RE | Admit: 2016-09-26 | Payer: Medicaid Other | Source: Ambulatory Visit

## 2016-09-26 ENCOUNTER — Emergency Department (HOSPITAL_COMMUNITY): Payer: Medicaid Other

## 2016-09-26 ENCOUNTER — Telehealth: Payer: Self-pay

## 2016-09-26 ENCOUNTER — Other Ambulatory Visit: Payer: Self-pay | Admitting: *Deleted

## 2016-09-26 ENCOUNTER — Encounter (HOSPITAL_COMMUNITY): Payer: Medicaid Other

## 2016-09-26 DIAGNOSIS — Z7189 Other specified counseling: Secondary | ICD-10-CM | POA: Diagnosis not present

## 2016-09-26 DIAGNOSIS — C649 Malignant neoplasm of unspecified kidney, except renal pelvis: Secondary | ICD-10-CM | POA: Diagnosis not present

## 2016-09-26 DIAGNOSIS — Z7984 Long term (current) use of oral hypoglycemic drugs: Secondary | ICD-10-CM | POA: Diagnosis not present

## 2016-09-26 DIAGNOSIS — Z79899 Other long term (current) drug therapy: Secondary | ICD-10-CM | POA: Diagnosis not present

## 2016-09-26 DIAGNOSIS — I10 Essential (primary) hypertension: Secondary | ICD-10-CM | POA: Insufficient documentation

## 2016-09-26 DIAGNOSIS — Z87891 Personal history of nicotine dependence: Secondary | ICD-10-CM | POA: Diagnosis not present

## 2016-09-26 DIAGNOSIS — E119 Type 2 diabetes mellitus without complications: Secondary | ICD-10-CM | POA: Insufficient documentation

## 2016-09-26 DIAGNOSIS — C659 Malignant neoplasm of unspecified renal pelvis: Secondary | ICD-10-CM

## 2016-09-26 DIAGNOSIS — R1031 Right lower quadrant pain: Secondary | ICD-10-CM

## 2016-09-26 DIAGNOSIS — R531 Weakness: Secondary | ICD-10-CM | POA: Diagnosis present

## 2016-09-26 DIAGNOSIS — Z515 Encounter for palliative care: Secondary | ICD-10-CM | POA: Diagnosis not present

## 2016-09-26 DIAGNOSIS — D649 Anemia, unspecified: Principal | ICD-10-CM

## 2016-09-26 LAB — CBC WITH DIFFERENTIAL/PLATELET
Basophils Absolute: 0 10*3/uL (ref 0.0–0.1)
Basophils Relative: 0 %
Eosinophils Absolute: 0 10*3/uL (ref 0.0–0.7)
Eosinophils Relative: 0 %
HEMATOCRIT: 21.2 % — AB (ref 39.0–52.0)
HEMOGLOBIN: 6.8 g/dL — AB (ref 13.0–17.0)
LYMPHS ABS: 2.1 10*3/uL (ref 0.7–4.0)
LYMPHS PCT: 13 %
MCH: 29.4 pg (ref 26.0–34.0)
MCHC: 32.1 g/dL (ref 30.0–36.0)
MCV: 91.8 fL (ref 78.0–100.0)
MONOS PCT: 6 %
Monocytes Absolute: 0.9 10*3/uL (ref 0.1–1.0)
NEUTROS PCT: 81 %
Neutro Abs: 13.2 10*3/uL — ABNORMAL HIGH (ref 1.7–7.7)
Platelets: 84 10*3/uL — ABNORMAL LOW (ref 150–400)
RBC: 2.31 MIL/uL — AB (ref 4.22–5.81)
RDW: 15.7 % — ABNORMAL HIGH (ref 11.5–15.5)
WBC: 16.2 10*3/uL — AB (ref 4.0–10.5)

## 2016-09-26 LAB — COMPREHENSIVE METABOLIC PANEL
ALT: 9 U/L — AB (ref 17–63)
AST: 9 U/L — AB (ref 15–41)
Albumin: 1.3 g/dL — ABNORMAL LOW (ref 3.5–5.0)
Alkaline Phosphatase: 124 U/L (ref 38–126)
Anion gap: 8 (ref 5–15)
BUN: 38 mg/dL — ABNORMAL HIGH (ref 6–20)
CHLORIDE: 104 mmol/L (ref 101–111)
CO2: 25 mmol/L (ref 22–32)
Calcium: 7.3 mg/dL — ABNORMAL LOW (ref 8.9–10.3)
Creatinine, Ser: 1.11 mg/dL (ref 0.61–1.24)
Glucose, Bld: 159 mg/dL — ABNORMAL HIGH (ref 65–99)
POTASSIUM: 4.3 mmol/L (ref 3.5–5.1)
SODIUM: 137 mmol/L (ref 135–145)
Total Bilirubin: 0.7 mg/dL (ref 0.3–1.2)
Total Protein: 5.5 g/dL — ABNORMAL LOW (ref 6.5–8.1)

## 2016-09-26 LAB — I-STAT CHEM 8, ED
BUN: 34 mg/dL — AB (ref 6–20)
CHLORIDE: 102 mmol/L (ref 101–111)
Calcium, Ion: 1.07 mmol/L — ABNORMAL LOW (ref 1.15–1.40)
Creatinine, Ser: 1.2 mg/dL (ref 0.61–1.24)
Glucose, Bld: 156 mg/dL — ABNORMAL HIGH (ref 65–99)
HEMATOCRIT: 19 % — AB (ref 39.0–52.0)
Hemoglobin: 6.5 g/dL — CL (ref 13.0–17.0)
POTASSIUM: 4.3 mmol/L (ref 3.5–5.1)
Sodium: 137 mmol/L (ref 135–145)
TCO2: 25 mmol/L (ref 0–100)

## 2016-09-26 LAB — I-STAT CG4 LACTIC ACID, ED: LACTIC ACID, VENOUS: 1.09 mmol/L (ref 0.5–1.9)

## 2016-09-26 LAB — CBC
HCT: 23.5 % — ABNORMAL LOW (ref 39.0–52.0)
Hemoglobin: 7.7 g/dL — ABNORMAL LOW (ref 13.0–17.0)
MCH: 30 pg (ref 26.0–34.0)
MCHC: 32.8 g/dL (ref 30.0–36.0)
MCV: 91.4 fL (ref 78.0–100.0)
PLATELETS: 92 10*3/uL — AB (ref 150–400)
RBC: 2.57 MIL/uL — AB (ref 4.22–5.81)
RDW: 15 % (ref 11.5–15.5)
WBC: 16.6 10*3/uL — AB (ref 4.0–10.5)

## 2016-09-26 LAB — PROTIME-INR
INR: 1.52
Prothrombin Time: 18.5 seconds — ABNORMAL HIGH (ref 11.4–15.2)

## 2016-09-26 LAB — PREPARE RBC (CROSSMATCH)

## 2016-09-26 MED ORDER — MORPHINE SULFATE (CONCENTRATE) 10 MG/0.5ML PO SOLN
20.0000 mg | Freq: Four times a day (QID) | ORAL | Status: DC
  Administered 2016-09-26 – 2016-09-27 (×4): 20 mg via ORAL
  Filled 2016-09-26 (×4): qty 1

## 2016-09-26 MED ORDER — SODIUM CHLORIDE 0.9 % IV BOLUS (SEPSIS)
1000.0000 mL | Freq: Once | INTRAVENOUS | Status: AC
  Administered 2016-09-26: 1000 mL via INTRAVENOUS

## 2016-09-26 MED ORDER — MORPHINE SULFATE 30 MG PO TABS: 30.0000 mg | ORAL_TABLET | ORAL | Status: DC | PRN

## 2016-09-26 MED ORDER — SENNOSIDES-DOCUSATE SODIUM 8.6-50 MG PO TABS: 2.0000 | ORAL_TABLET | Freq: Two times a day (BID) | ORAL | Status: DC | PRN

## 2016-09-26 MED ORDER — FLEET ENEMA 7-19 GM/118ML RE ENEM: 1.0000 | ENEMA | Freq: Once | RECTAL | Status: DC | PRN

## 2016-09-26 MED ORDER — MORPHINE SULFATE ER 30 MG PO TBCR: 30.0000 mg | EXTENDED_RELEASE_TABLET | Freq: Two times a day (BID) | ORAL | Status: DC

## 2016-09-26 MED ORDER — CLONAZEPAM 0.5 MG PO TABS
0.5000 mg | ORAL_TABLET | Freq: Every day | ORAL | Status: DC
  Administered 2016-09-26 – 2016-09-27 (×2): 0.5 mg via ORAL
  Filled 2016-09-26 (×2): qty 1

## 2016-09-26 MED ORDER — BISACODYL 10 MG RE SUPP: 10.0000 mg | Freq: Every day | RECTAL | Status: DC | PRN

## 2016-09-26 MED ORDER — NAPROXEN SODIUM 275 MG PO TABS: 440.0000 mg | ORAL_TABLET | Freq: Two times a day (BID) | ORAL | Status: DC | PRN

## 2016-09-26 MED ORDER — ENSURE ENLIVE PO LIQD
237.0000 mL | Freq: Three times a day (TID) | ORAL | Status: DC
  Administered 2016-09-26 – 2016-09-27 (×2): 237 mL via ORAL

## 2016-09-26 MED ORDER — LIDOCAINE-PRILOCAINE 2.5-2.5 % EX CREA: 1.0000 "application " | TOPICAL_CREAM | Freq: Every day | CUTANEOUS | Status: DC | PRN

## 2016-09-26 MED ORDER — MORPHINE SULFATE (CONCENTRATE) 10 MG/0.5ML PO SOLN: 10.0000 mg | ORAL | Status: DC | PRN

## 2016-09-26 MED ORDER — LORAZEPAM 2 MG/ML IJ SOLN: 1.0000 mg | INTRAMUSCULAR | Status: DC | PRN

## 2016-09-26 MED ORDER — FUROSEMIDE 20 MG PO TABS: 20.0000 mg | ORAL_TABLET | Freq: Every day | ORAL | 0 refills | Status: DC

## 2016-09-26 MED ORDER — MORPHINE SULFATE (PF) 2 MG/ML IV SOLN
1.0000 mg | INTRAVENOUS | Status: DC | PRN
  Administered 2016-09-27: 1 mg via INTRAVENOUS
  Filled 2016-09-26: qty 1

## 2016-09-26 MED ORDER — ACETAMINOPHEN 325 MG PO TABS: 650.0000 mg | ORAL_TABLET | Freq: Four times a day (QID) | ORAL | Status: DC | PRN

## 2016-09-26 MED ORDER — SODIUM CHLORIDE 0.9 % IV SOLN
Freq: Once | INTRAVENOUS | Status: AC
  Administered 2016-09-26: 13:00:00 via INTRAVENOUS

## 2016-09-26 MED ORDER — TRAZODONE HCL 50 MG PO TABS: 25.0000 mg | ORAL_TABLET | Freq: Every evening | ORAL | Status: DC | PRN

## 2016-09-26 MED ORDER — PANTOPRAZOLE SODIUM 40 MG PO TBEC: 40.0000 mg | DELAYED_RELEASE_TABLET | Freq: Every day | ORAL | Status: DC

## 2016-09-26 MED ORDER — MIRTAZAPINE 15 MG PO TABS
15.0000 mg | ORAL_TABLET | Freq: Two times a day (BID) | ORAL | Status: DC
  Administered 2016-09-26 – 2016-09-27 (×2): 15 mg via ORAL
  Filled 2016-09-26 (×2): qty 1

## 2016-09-26 MED ORDER — MEGESTROL ACETATE 400 MG/10ML PO SUSP
800.0000 mg | Freq: Every day | ORAL | Status: DC
  Administered 2016-09-26 – 2016-09-27 (×2): 800 mg via ORAL
  Filled 2016-09-26 (×2): qty 20

## 2016-09-26 MED ORDER — ACETAMINOPHEN 650 MG RE SUPP: 650.0000 mg | Freq: Four times a day (QID) | RECTAL | Status: DC | PRN

## 2016-09-26 MED ORDER — METFORMIN HCL 500 MG PO TABS: 500.0000 mg | ORAL_TABLET | Freq: Two times a day (BID) | ORAL | Status: DC

## 2016-09-26 MED ORDER — GLUCOSE 4 G PO CHEW: 4.0000 g | CHEWABLE_TABLET | Freq: Once | ORAL | Status: DC | PRN

## 2016-09-26 NOTE — ED Provider Notes (Signed)
[facility] DEPT Provider Note   CSN: [id number] Arrival date & time: 09/26/16  1039     History   Chief Complaint Chief Complaint  Patient presents with  . Weakness  . Hypotension    HPI David [name] is a 56 y.o. male.  HPI  56 year old male with a history of metastatic renal carcinoma presents with weakness and hypotension. He last received immunotherapy yesterday where his blood pressure was a little bit low. Family states he is typically about 785 systolic. He has had overall decreased fluid and solid food intake over the last couple weeks. He's been feeling fatigued, especially the last couple days. He has had an intermittent cough and has been short of breath since yesterday. No chest pain. Has right-sided abdominal pain which is not new and seems to come and go and is from the cancer spread. He frequently gets blood transfusions and was due for a blood transfusion today but he felt too weak to go. He chronically has hematuria which is the cause of his frequent blood transfusions. He has had some stinging with urination. None of the symptoms are particularly new except the fatigue is worse. He does have generalized anasarca which is relatively new over the last few weeks. No fevers during this time. No vomiting. Has had constipation and dark stool but no black stool.  Past Medical History:  Diagnosis Date  . Cancer ([place])   . Dental caries   . Diabetes mellitus, type II ([place]) 01/2012  . Hyperlipidemia   . Hypertension   . Influenza vaccine refused 01/18/2013  . Kidney cancer, primary, with metastasis from kidney to other site [facility])   . Obesity   . Pneumococcal vaccine refused 01/18/2013  . Scoliosis   . Tobacco use   . Vision impairment    left, due to prior trauma    Patient Active Problem List   Diagnosis Date Noted  . Anemia 07/14/2016  . Abscess of buttock, right 03/24/2016  . Elevated CK 03/24/2016  . Myalgia 03/24/2016  . Edema 03/24/2016  . Folliculitis  88/50/2774  . Rash 03/24/2016  . Acute serous otitis media 03/17/2016  . Port catheter in place 12/11/2015  . Protein-calorie malnutrition, severe 11/15/2015  . Hyponatremia 11/14/2015  . Hypoalbuminemia due to protein-calorie malnutrition ([place]) 11/14/2015  . Generalized weakness 11/14/2015  . Mild dehydration 11/14/2015  . SIRS (systemic inflammatory response syndrome) ([facility])   . S/P thoracentesis   . Fever, unspecified 10/25/2015  . Pleural effusion on right 10/25/2015  . Pleural mass 10/25/2015  . Symptomatic anemia 10/24/2015  . 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 Abnormal CT of the chest 10/03/2015  . Screening for prostate cancer 10/03/2015  . Creatinine elevation 10/03/2015  . Pulmonary nodules 10/03/2015  . Loss of weight 10/03/2015  . Lymphadenopathy 10/03/2015  . History of colonic polyps 10/03/2015  . Leukocytosis 10/03/2015  . Anemia, unspecified 10/03/2015  . Former smoker 10/03/2015  . DM2 (diabetes mellitus, type 2) ([place]) 01/18/2013  . Obesity, unspecified 01/18/2013    Past Surgical History:  Procedure Laterality Date  . COLONOSCOPY W/ POLYPECTOMY    . EYE SURGERY  1996   due to trauma, left eye  . IR GENERIC HISTORICAL  11/26/2015   IR US GUIDE VASC ACCESS RIGHT 11/26/2015 [facility]  . IR GENERIC HISTORICAL  11/26/2015   IR FLUORO GUIDE PORT INSERTION RIGHT 11/26/2015 [facility]       Home Medications    Prior to Admission medications  Medication Sig Start Date End Date Taking? Authorizing Provider  carvedilol (COREG) 3.125 MG tablet take 1 tablet by mouth twice a day with meals 03/31/16   Tysinger, Camelia Eng, PA-C  chlorproMAZINE (THORAZINE) 50 MG tablet Take 1 tablet (50 mg total) by mouth 3 (three) times daily as needed for hiccoughs. 07/09/16   Wyatt Portela, MD  clonazePAM (KLONOPIN) 0.5 MG tablet Take one tablet by mouth 1-2 times a day if needed for anxiety. 09/16/16   Wyatt Portela, MD  feeding supplement, ENSURE ENLIVE,  (ENSURE ENLIVE) LIQD Take 237 mLs by mouth 3 (three) times daily between meals. 07/15/16   Robbie Lis, MD  furosemide (LASIX) 20 MG tablet Take 1 tablet (20 mg total) by mouth daily. 09/26/16   Wyatt Portela, MD  glucose 4 GM chewable tablet Chew 1 tablet by mouth once as needed for low blood sugar.  08/05/16 08/05/17  [provider]  insulin glargine (LANTUS) 100 UNIT/ML injection Inject 10 Units into the skin at bedtime as needed (for blood sugar).  08/05/16 08/05/17  [provider]  lidocaine-prilocaine (EMLA) cream Apply 1 application topically as needed. 09/25/16   Wyatt Portela, MD  megestrol (MEGACE ES) 625 MG/5ML suspension take 5 milliliters by mouth daily Patient not taking: Reported on 09/05/2016 07/28/16   Wyatt Portela, MD  metFORMIN (GLUCOPHAGE) 500 MG tablet Take 1 tablet (500 mg total) by mouth 2 (two) times daily with a meal. Patient not taking: Reported on 09/05/2016 01/29/16   Tysinger, Camelia Eng, PA-C  mirtazapine (REMERON) 15 MG tablet Take 1 tablet (15 mg total) by mouth 2 (two) times daily. 09/02/16 09/02/17  Wyatt Portela, MD  morphine (MS CONTIN) 30 MG 12 hr tablet Take 1 tablet (30 mg total) by mouth every 12 (twelve) hours. 09/16/16   Wyatt Portela, MD  naproxen sodium (ANAPROX) 220 MG tablet Take 440 mg by mouth 2 (two) times daily as needed (pain).    [provider]  omeprazole (PRILOSEC) 20 MG capsule Take 20 mg by mouth daily as needed (for acid reflux).     [provider]  oxyCODONE (OXY IR/ROXICODONE) 5 MG immediate release tablet Take 1 tablet (5 mg total) by mouth every 4 (four) hours as needed for severe pain. 09/02/16   Wyatt Portela, MD  senna-docusate (SENNA S) 8.6-50 MG tablet Take 2 tablets by mouth 2 (two) times daily. 09/25/16   Wyatt Portela, MD  testosterone cypionate (DEPOTESTOSTERONE CYPIONATE) 200 MG/ML injection INJECT 1 ML INTRAMUSCULARLY EVERY 14 DAYS Patient taking differently: INJECT 200 MG INTRAMUSCULARLY  EVERY 14 DAYS 08/13/16   Tysinger, Camelia Eng, PA-C  vitamin B-12 (CYANOCOBALAMIN) 1000 MCG tablet Take 1,000 mcg by mouth daily.     [provider]    Family History Family History  Problem Relation Age of Onset  . Diabetes Sister   . Other Sister        gastric bypass  . Hyperlipidemia Mother   . Diabetes Mother   . Cancer Father 64       prostate cancer  . Stroke Maternal Grandmother   . Heart disease Neg Hx   . Hypertension Neg Hx   . Colon cancer Neg Hx     Social History Social History  Substance Use Topics  . Smoking status: Former Smoker    Packs/day: 0.25    Years: 20.00    Types: Cigarettes  . Smokeless tobacco: Never Used  . Alcohol use No  Allergies   Patient has no known allergies.   Review of Systems Review of Systems  Constitutional: Positive for fatigue. Negative for fever.  Respiratory: Positive for cough and shortness of breath.   Cardiovascular: Positive for leg swelling. Negative for chest pain.  Gastrointestinal: Positive for abdominal pain. Negative for constipation and vomiting.  Genitourinary: Positive for dysuria and hematuria.  Neurological: Positive for dizziness and weakness.  All other systems reviewed and are negative.    Physical Exam Updated Vital Signs BP (!) 87/54 (BP Location: Left Arm)   Pulse 91   Temp 97.7 F (36.5 C)   Resp 12   SpO2 96%   Physical Exam  Constitutional: He is oriented to person, place, and time. He appears well-developed and well-nourished.  HENT:  Head: Normocephalic and atraumatic.  Right Ear: External ear normal.  Left Ear: External ear normal.  Nose: Nose normal.  Eyes: Right eye exhibits no discharge. Left eye exhibits no discharge.  Neck: Neck supple.  Cardiovascular: Normal rate, regular rhythm and normal heart sounds.   Pulmonary/Chest: Effort normal. He has rales in the right upper field, the right middle field and the right lower field.  Abdominal: Soft. He exhibits mass.  There is tenderness (right sided).  Musculoskeletal: He exhibits edema (bilateral lower extremity edema, pitting).  Neurological: He is alert and oriented to person, place, and time.  Skin: Skin is warm and dry.  Nursing note and vitals reviewed.    ED Treatments / Results  Labs (all labs ordered are listed, but only abnormal results are displayed) Labs Reviewed  COMPREHENSIVE METABOLIC PANEL - Abnormal; Notable for the following:       Result Value   Glucose, Bld 159 (*)    BUN 38 (*)    Calcium 7.3 (*)    Total Protein 5.5 (*)    Albumin 1.3 (*)    AST 9 (*)    ALT 9 (*)    All other components within normal limits  CBC WITH DIFFERENTIAL/PLATELET - Abnormal; Notable for the following:    WBC 16.2 (*)    RBC 2.31 (*)    Hemoglobin 6.8 (*)    HCT 21.2 (*)    RDW 15.7 (*)    Platelets 84 (*)    Neutro Abs 13.2 (*)    All other components within normal limits  PROTIME-INR - Abnormal; Notable for the following:    Prothrombin Time 18.5 (*)    All other components within normal limits  I-STAT CHEM 8, ED - Abnormal; Notable for the following:    BUN 34 (*)    Glucose, Bld 156 (*)    Calcium, Ion 1.07 (*)    Hemoglobin 6.5 (*)    HCT 19.0 (*)    All other components within normal limits  CULTURE, BLOOD (ROUTINE X 2)  CULTURE, BLOOD (ROUTINE X 2)  URINE CULTURE  URINALYSIS, ROUTINE W REFLEX MICROSCOPIC  I-STAT CG4 LACTIC ACID, ED  POC OCCULT BLOOD, ED  PREPARE RBC (CROSSMATCH)    EKG  EKG Interpretation None       Radiology Dg Chest Portable 1 View  Result Date: 09/26/2016 CLINICAL DATA:  Renal carcinoma.  Fatigue and dysphagia EXAM: PORTABLE CHEST 1 VIEW COMPARISON:  Chest radiograph August 28, 2016 and chest CT September 04, 2016 FINDINGS: Port-A-Cath tip is at the cavoatrial junction. There are areas of consolidation and loculated effusion on the right with multiple masses, largest measuring approximately 4 x 3 cm on the right. There are small  more subtle nodular  lesions in the left lung. No consolidation on the left. There is mild cardiomegaly with pulmonary vascularity within normal limits. There is extensive adenopathy in the right paratracheal and hilar regions. No bone lesions evident. IMPRESSION: Extensive metastatic disease with multiple nodular lesions, much more on the right than the left, as well as right sided adenopathy. Ill-defined consolidation loculated effusion on the right, unchanged. Stable cardiomegaly. No pneumothorax. Electronically Signed   By: Lowella Grip III M.D.   On: 09/26/2016 11:33    Procedures Procedures (including critical care time)  CRITICAL CARE Performed by: Sherwood Gambler T   Total critical care time: 30 minutes  Critical care time was exclusive of separately billable procedures and treating other patients.  Critical care was necessary to treat or prevent imminent or life-threatening deterioration.  Critical care was time spent personally by me on the following activities: development of treatment plan with patient and/or surrogate as well as nursing, discussions with consultants, evaluation of patient's response to treatment, examination of patient, obtaining history from patient or surrogate, ordering and performing treatments and interventions, ordering and review of laboratory studies, ordering and review of radiographic studies, pulse oximetry and re-evaluation of patient's condition.   Medications Ordered in ED Medications  0.9 %  sodium chloride infusion (not administered)  sodium chloride 0.9 % bolus 1,000 mL (1,000 mLs Intravenous New Bag/Given 09/26/16 1133)     Initial Impression / Assessment and Plan / ED Course  I have reviewed the triage vital signs and the nursing notes.  Pertinent labs & imaging results that were available during my care of the patient were reviewed by me and considered in my medical decision making (see chart for details).     I think the patient's hypotension is  coming all from anemia. This appears to be a chronic problem from continuous hematuria from his cancer. I doubt this is related to sepsis given he is afebrile and no new or focal findings. His chest x-ray shows chronic changes. After discussion with patient, he wants to be admitted given how symptomatic he is. However he is definitely a DO NOT RESUSCITATE per him. I discussed blood transfusion which he agrees with. His MAP is now ~68 after initial fluid bolus, will hold further fluids unless he drops or until the blood comes. Admit to the hospitalist.  Final Clinical Impressions(s) / ED Diagnoses   Final diagnoses:  Symptomatic anemia    New Prescriptions New Prescriptions   No medications on file     Sherwood Gambler, MD 09/26/16 1231

## 2016-09-26 NOTE — Consult Note (Signed)
Consultation Note Date: 09/26/2016   Patient Name: David [name]  DOB: 23-Dec-1960  MRN: [id number]  Age / Sex: 56 y.o., male  PCP: Glade Lloyd Camelia Eng, PA-C Referring Physician: Oren Binet*  Reason for Consultation: Establishing goals of care, Non pain symptom management, Pain control and Terminal Care  HPI/Patient Profile: 56 y.o. male  admitted on 09/26/2016    Clinical Assessment and Goals of Care:  56 year old dominant past medical history significant for left kidney metastatic carcinoma, carcinoma renal pelvis, known history of pulmonary metastases nodules and pleural effusion. 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 He underwent chemotherapy. He follows with Dr. Alen Blew from medical oncology. Currently he was on Pembrolizumab therapy but was also noted to have a lot of functional decline, generalized anasarca, minimal to nil oral intake, ongoing deterioration in quality of life. Hence, he was seen by Dr. Alen Blew on 09-25-16 and recommendations were made for hospice. Patient declined and elected to continue Pembrolizumab.   Patient arrived to the emergency department with shortness of breath. Patient is found to have a hemoglobin of 6.8 g/dL. Patient's blood pressure is low. Currently he is being admitted to hospitalist service. He is undergoing packed red blood cell transfusion. Palliative care consultation has been requested after initial discussions were undertaken by admitting physician regarding goals of care.  Elderly appearing gentleman resting in bed. He has his eyes wide open, appears visibly anxious. Family is present at the bedside. I discussed with the patient, wife, son, daughter, daughter-in-law, brother present at the bedside. We reviewed his oncologic history. Goals wishes and values discussions undertaken. We discussed in detail  about hospice philosophy of care. We discussed about comfort measures only. See recommendations below. Thank you for the consult.  NEXT OF KIN  Wife, son, daughter. Currently, brother and daughter-in-law are also present at the bedside  SUMMARY OF RECOMMENDATIONS   Agree with DNR DNI.  Agree with PRBC at least for symptom management, improving strength, well being and improving dyspnea temporarily.   Family considering a shift towards full scope of comfort measures, I have briefly discussed with them about comfort measures/hospice philosophy of care, we talked about the type of care that is offered in a residential hospice setting. Family will continue to think about this, we will follow up in am.   IV when necessary morphine and IV when necessary Ativan for symptom management.  Consult to spiritual care.  Comfort cart for the family.  Prognosis few days to less than 2 weeks discussed frankly and compassionately with family outside the room.  Code Status/Advance Care Planning:  DNR    Symptom Management:    As above  Palliative Prophylaxis:   Delirium Protocol   Psycho-social/Spiritual:   Desire for further Chaplaincy support:yes  Additional Recommendations: Education on Hospice  Prognosis:   < 2 weeks  Discharge Planning: Hospice facility      Primary Diagnoses: Present on Admission: . Symptomatic anemia   I have reviewed the medical record, interviewed the patient  and family, and examined the patient. The following aspects are pertinent.  Past Medical History:  Diagnosis Date  . Cancer ([place])   . Dental caries   . Diabetes mellitus, type II ([place]) 01/2012  . Hyperlipidemia   . Hypertension   . Influenza vaccine refused 01/18/2013  . Kidney cancer, primary, with metastasis from kidney to other site [facility])   . Obesity   . Pneumococcal vaccine refused 01/18/2013  . Scoliosis   . Tobacco use   . Vision impairment    left, due to prior trauma   Social  History   Social History  . Marital status: Married    Spouse name: N/A  . Number of children: N/A  . Years of education: N/A   Social History Main Topics  . Smoking status: Former Smoker    Packs/day: 0.25    Years: 20.00    Types: Cigarettes  . Smokeless tobacco: Never Used  . Alcohol use No  . Drug use: No  . Sexual activity: Not Currently   Other Topics Concern  . 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 Diabetes Sister   . Other Sister        gastric bypass  . Hyperlipidemia Mother   . Diabetes Mother   . Cancer Father 36       prostate cancer  . Stroke Maternal Grandmother   . Heart disease Neg Hx   . Hypertension Neg Hx   . Colon cancer Neg Hx    Scheduled Meds: . clonazePAM  0.5 mg Oral Daily  . feeding supplement (ENSURE ENLIVE)  237 mL Oral TID BM  . megestrol  625 mg Oral Daily  . mirtazapine  15 mg Oral BID  . morphine CONCENTRATE  20 mg Oral Q6H WA   Continuous Infusions: PRN Meds:.acetaminophen **OR** acetaminophen, bisacodyl, glucose, lidocaine-prilocaine, LORazepam, morphine injection, morphine CONCENTRATE, sodium phosphate, traZODone Medications Prior to Admission:  Prior to Admission medications   Medication Sig Start Date End Date Taking? Authorizing Provider  carvedilol (COREG) 3.125 MG tablet take 1 tablet by mouth twice a day with meals 03/31/16  Yes Tysinger, Camelia Eng, PA-C  clonazePAM (KLONOPIN) 0.5 MG tablet Take one tablet by mouth 1-2 times a day if needed for anxiety. 09/16/16  Yes Wyatt Portela, MD  doxylamine, Sleep, (UNISOM) 25 MG tablet Take 50 mg by mouth at bedtime as needed for sleep.   Yes [provider]  feeding supplement, ENSURE ENLIVE, (ENSURE ENLIVE) LIQD Take 237 mLs by mouth 3 (three) times daily between meals. Patient taking differently: Take 237 mLs by mouth 3 (three)  times daily between meals. Has only been taking twice daily recently because patient is having trouble swallowing 07/15/16  Yes Robbie Lis, MD  furosemide (LASIX) 20 MG tablet Take 1 tablet (20 mg total) by mouth daily. 09/26/16  Yes Wyatt Portela, MD  glucose 4 GM chewable tablet Chew 4 g by mouth once as needed for low blood sugar.  08/05/16 08/05/17 Yes [provider]  insulin glargine (LANTUS) 100 UNIT/ML injection Inject 10 Units into the skin 2 (two) times daily as needed (for blood sugar).  08/05/16 08/05/17 Yes [provider]  lidocaine-prilocaine (EMLA) cream Apply 1 application topically as needed. 09/25/16  Yes Wyatt Portela, MD  megestrol (MEGACE ES) 625 MG/5ML suspension take 5  milliliters by mouth daily Patient taking differently: Take 5 ml by mouth daily 07/28/16  Yes Shadad, Mathis Dad, MD  metFORMIN (GLUCOPHAGE) 500 MG tablet Take 1 tablet (500 mg total) by mouth 2 (two) times daily with a meal. 01/29/16  Yes Tysinger, Camelia Eng, PA-C  mirtazapine (REMERON) 15 MG tablet Take 1 tablet (15 mg total) by mouth 2 (two) times daily. Patient taking differently: Take 15 mg by mouth 2 (two) times daily as needed (depression).  09/02/16 09/02/17 Yes Wyatt Portela, MD  morphine (MS CONTIN) 30 MG 12 hr tablet Take 1 tablet (30 mg total) by mouth every 12 (twelve) hours. 09/16/16  Yes Wyatt Portela, MD  naproxen sodium (ANAPROX) 220 MG tablet Take 440 mg by mouth 2 (two) times daily as needed (pain).   Yes [provider]  omeprazole (PRILOSEC) 20 MG capsule Take 20 mg by mouth daily as needed (for acid reflux).    Yes [provider]  senna-docusate (SENNA S) 8.6-50 MG tablet Take 2 tablets by mouth 2 (two) times daily. Patient taking differently: Take 2 tablets by mouth 2 (two) times daily as needed for moderate constipation.  09/25/16  Yes Wyatt Portela, MD  testosterone cypionate (DEPOTESTOSTERONE CYPIONATE) 200 MG/ML injection INJECT 1 ML INTRAMUSCULARLY EVERY  14 DAYS Patient taking differently: INJECT 200 MG INTRAMUSCULARLY EVERY 14 DAYS 08/13/16  Yes Tysinger, Camelia Eng, PA-C  vitamin B-12 (CYANOCOBALAMIN) 1000 MCG tablet Take 1,000 mcg by mouth daily.    Yes [provider]  chlorproMAZINE (THORAZINE) 50 MG tablet Take 1 tablet (50 mg total) by mouth 3 (three) times daily as needed for hiccoughs. Patient not taking: Reported on 09/26/2016 07/09/16   Wyatt Portela, MD  oxyCODONE (OXY IR/ROXICODONE) 5 MG immediate release tablet Take 1 tablet (5 mg total) by mouth every 4 (four) hours as needed for severe pain. Patient not taking: Reported on 09/26/2016 09/02/16   Wyatt Portela, MD   No Known Allergies Review of Systems +anxious +RLQ abd pain  Physical Exam Older than stated age appearing gentleman resting in bed Eyes wide open, appears visibly anxious Shallow breathing S1-S2 Abdomen distended pain in right lower quadrant Extremities with trace edema Thin gentleman, muscle wasting evident  Vital Signs: BP 94/60 (BP Location: Right Arm)   Pulse 98   Temp 98.1 F (36.7 C) (Oral)   Resp 18   Ht 5\' 9"  (1.753 m)   Wt 81.6 kg (180 lb)   SpO2 100%   BMI 26.58 kg/m      Pain Score: 0-No pain   SpO2: SpO2: 100 % O2 Device:SpO2: 100 % O2 Flow Rate: .   IO: Intake/output summary:  Intake/Output Summary (Last 24 hours) at 09/26/16 1523 Last data filed at 09/26/16 1258  Gross per 24 hour  Intake             1315 ml  Output                0 ml  Net             1315 ml    LBM:   Baseline Weight: Weight: 83 kg (183 lb) Most recent weight: Weight: 81.6 kg (180 lb)     Palliative Assessment/Data:   Flowsheet Rows     Most Recent Value  Intake Tab  Referral Department  Hospitalist  Unit at Time of Referral  Oncology Unit  Palliative Care Primary Diagnosis  Cancer  Palliative Care Type  New Palliative care  Reason for referral  Pain, Non-pain Symptom, End of Life Care Assistance, Counsel Regarding Hospice  Date first seen  by Palliative Care  09/26/16  Clinical Assessment  Palliative Performance Scale Score  30%  Pain Max last 24 hours  6  Pain Min Last 24 hours  5  Dyspnea Max Last 24 Hours  5  Dyspnea Min Last 24 hours  4  Nausea Max Last 24 Hours  3  Nausea Min Last 24 Hours  2  Anxiety Max Last 24 Hours  9  Anxiety Min Last 24 Hours  6  Psychosocial & Spiritual Assessment  Palliative Care Outcomes  Patient/Family meeting held?  Yes  Who was at the meeting?  patient, wife, son daughter, daughter in law, brother.   Palliative Care Outcomes  Clarified goals of care      Time In:  1400 Time Out:  1510 Time Total:  70 min  Greater than 50%  of this time was spent counseling and coordinating care related to the above assessment and plan.  Signed by: Loistine Chance, MD  [phone]  Please contact Palliative Medicine Team phone at [phone] for questions and concerns.  For individual provider: See Shea Evans

## 2016-09-26 NOTE — Telephone Encounter (Signed)
TC from Hospitalist, Dr. Jamse Arn, admitting MD.  Pt being admitted from ED to [facility].. She wanted Dr. Alen Blew to know that pt is agreable to comfort measures only, including Hospice.  She will be making Palliative Care referral while pt is in patient. Hospice referral to come from them.

## 2016-09-26 NOTE — ED Triage Notes (Signed)
Pt was scheduled for blood transfusion today. Pt reports he felt to weak today to go for appointment.  Pt reports R side abd pain. Hx of kidney cancer. Pt on immunotherapy. Last immunotherapy treatment yesterday. Not on chemo.  Pt has also had fatigue, weakness, and difficulty swallowing for the past 2 weeks.

## 2016-09-26 NOTE — ED Notes (Signed)
This nurse remained at patient's bedside for the first 15 minutes of transfusion. Patient tolerated well. No adverse reactions noted. VSS. Reported to patient's nurse, Caryl Pina.

## 2016-09-26 NOTE — H&P (Signed)
History and Physical:    David [name]   WVP:[id number] DOB: [birth year] DOA: 09/26/2016  Referring MD/provider: Dr Regenia Skeeter PCP: Glade Lloyd Camelia Eng, PA-C   Patient coming from: home  Chief Complaint: ongoing fatigue and weakness and hypotension.   History of Present Illness:   David [name] is an 56 y.o. male With widely metastatic renal cell carcinoma is brought in for blood transfusion which was originally scheduled to be done as an outpatient. However patient was noted to be very hypotensive so was brought to the emergency room instead. Patient wife states that she hopes that he would get better enough to be himself. However patient himself notes that he is in pain and would like to initiate comfort care although he is willing to undergo his blood transfusion.  ED Course:  The patient  was typed and crossed and a blood transfusion was started as well as some IV hydration.   ROS:   ROS Patient admits to profound fatigue, constant pain, discomfort from his anasarca, difficulty swallowing, ongoing nausea, continued anxiety.   Past Medical History:   Past Medical History:  Diagnosis Date  . Cancer ([place])   . Dental caries   . Diabetes mellitus, type II ([place]) 01/2012  . Hyperlipidemia   . Hypertension   . Influenza vaccine refused 01/18/2013  . Kidney cancer, primary, with metastasis from kidney to other site [facility])   . Obesity   . Pneumococcal vaccine refused 01/18/2013  . Scoliosis   . Tobacco use   . Vision impairment    left, due to prior trauma    Past Surgical History:   Past Surgical History:  Procedure Laterality Date  . COLONOSCOPY W/ POLYPECTOMY    . EYE SURGERY  1996   due to trauma, left eye  . IR GENERIC HISTORICAL  11/26/2015   IR US GUIDE VASC ACCESS RIGHT 11/26/2015 [facility]  . IR GENERIC HISTORICAL  11/26/2015   IR FLUORO GUIDE PORT INSERTION RIGHT 11/26/2015 [facility]    Social History:   Social History   Social History  . Marital status:  Married    Spouse name: N/A  . Number of children: N/A  . Years of education: N/A   Occupational History  . Not on file.   Social History Main Topics  . Smoking status: Former Smoker    Packs/day: 0.25    Years: 20.00    Types: Cigarettes  . Smokeless tobacco: Never Used  . Alcohol use No  . Drug use: No  . Sexual activity: Not Currently   Other Topics Concern  . 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  Family history:   Family History  Problem Relation Age of Onset  . Diabetes Sister   . Other Sister        gastric bypass  . Hyperlipidemia Mother   . Diabetes Mother   . Cancer Father 59       prostate cancer  . Stroke Maternal Grandmother   . Heart disease Neg Hx   . Hypertension Neg Hx   . Colon cancer Neg Hx     Current Medications:   Prior to Admission medications   Medication Sig Start Date End Date Taking? Authorizing Provider  carvedilol (COREG) 3.125 MG tablet take 1 tablet by mouth twice a day with meals 03/31/16  Tysinger, Camelia Eng, PA-C  chlorproMAZINE (THORAZINE) 50 MG tablet Take 1 tablet (50 mg total) by mouth 3 (three) times daily as needed for hiccoughs. 07/09/16   Wyatt Portela, MD  clonazePAM (KLONOPIN) 0.5 MG tablet Take one tablet by mouth 1-2 times a day if needed for anxiety. 09/16/16   Wyatt Portela, MD  feeding supplement, ENSURE ENLIVE, (ENSURE ENLIVE) LIQD Take 237 mLs by mouth 3 (three) times daily between meals. 07/15/16   Robbie Lis, MD  furosemide (LASIX) 20 MG tablet Take 1 tablet (20 mg total) by mouth daily. 09/26/16   Wyatt Portela, MD  glucose 4 GM chewable tablet Chew 1 tablet by mouth once as needed for low blood sugar.  08/05/16 08/05/17  [provider]  insulin glargine (LANTUS) 100 UNIT/ML injection Inject 10 Units into the skin at bedtime as needed (for blood  sugar).  08/05/16 08/05/17  [provider]  lidocaine-prilocaine (EMLA) cream Apply 1 application topically as needed. 09/25/16   Wyatt Portela, MD  megestrol (MEGACE ES) 625 MG/5ML suspension take 5 milliliters by mouth daily Patient not taking: Reported on 09/05/2016 07/28/16   Wyatt Portela, MD  metFORMIN (GLUCOPHAGE) 500 MG tablet Take 1 tablet (500 mg total) by mouth 2 (two) times daily with a meal. Patient not taking: Reported on 09/05/2016 01/29/16   Tysinger, Camelia Eng, PA-C  mirtazapine (REMERON) 15 MG tablet Take 1 tablet (15 mg total) by mouth 2 (two) times daily. 09/02/16 09/02/17  Wyatt Portela, MD  morphine (MS CONTIN) 30 MG 12 hr tablet Take 1 tablet (30 mg total) by mouth every 12 (twelve) hours. 09/16/16   Wyatt Portela, MD  naproxen sodium (ANAPROX) 220 MG tablet Take 440 mg by mouth 2 (two) times daily as needed (pain).    [provider]  omeprazole (PRILOSEC) 20 MG capsule Take 20 mg by mouth daily as needed (for acid reflux).     [provider]  oxyCODONE (OXY IR/ROXICODONE) 5 MG immediate release tablet Take 1 tablet (5 mg total) by mouth every 4 (four) hours as needed for severe pain. 09/02/16   Wyatt Portela, MD  senna-docusate (SENNA S) 8.6-50 MG tablet Take 2 tablets by mouth 2 (two) times daily. 09/25/16   Wyatt Portela, MD  testosterone cypionate (DEPOTESTOSTERONE CYPIONATE) 200 MG/ML injection INJECT 1 ML INTRAMUSCULARLY EVERY 14 DAYS Patient taking differently: INJECT 200 MG INTRAMUSCULARLY EVERY 14 DAYS 08/13/16   Tysinger, Camelia Eng, PA-C  vitamin B-12 (CYANOCOBALAMIN) 1000 MCG tablet Take 1,000 mcg by mouth daily.     [provider]    Physical Exam:   Vitals:   09/26/16 1315 09/26/16 1319 09/26/16 1330 09/26/16 1406  BP: (!) 93/58  90/62 94/60  Pulse: 98  98 98  Resp: 12  14 18   Temp: 98 F (36.7 C)   98.1 F (36.7 C)  TempSrc: Oral   Oral  SpO2: 96%  90% 100%  Weight:  83 kg (183 lb)  81.6 kg (180 lb)  Height:  5\' 9"   (1.753 m)  5\' 9"  (1.753 m)     Physical Exam: Blood pressure 94/60, pulse 98, temperature 98.1 F (36.7 C), temperature source Oral, resp. rate 18, height 5\' 9"  (1.753 m), weight 81.6 kg (180 lb), SpO2 100 %. Gen: Patient with market bitemporal wasting and total body anasarca lying in bed appearing anxious.  Head:  Normocephalic, atraumatic. Eye Extremely pale conjunctiva. Sclera nonicteric. Chest: Poor air entry bilaterally due  to poor inspiration effort. CV: Regular tachycardia AbdDistended, ascitic, diffusely tender to minimal palpation Extremities: Grossly edematous in all extremities. Neuro: Alert and oriented times 3;  Psych: Sad and anxious intermittently teary. But clear and focused in his decision-making. Excellent insight and logic.   Data Review:    Labs: Basic Metabolic Panel:  Recent Labs Lab 09/25/16 1247 09/26/16 1121 09/26/16 1128  NA 138 137 137  K 4.9 4.3 4.3  CL  --  102 104  CO2 26  --  25  GLUCOSE 183* 156* 159*  BUN 31.1* 34* 38*  CREATININE 1.0 1.20 1.11  CALCIUM 8.1*  --  7.3*   Liver Function Tests:  Recent Labs Lab 09/25/16 1247 09/26/16 1128  AST 8 9*  ALT 8 9*  ALKPHOS 180* 124  BILITOT 0.77 0.7  PROT 5.7* 5.5*  ALBUMIN 1.0* 1.3*   No results for input(s): LIPASE, AMYLASE in the last 168 hours. No results for input(s): AMMONIA in the last 168 hours. CBC:  Recent Labs Lab 09/25/16 1247 09/26/16 1121 09/26/16 1128  WBC 18.5*  --  16.2*  NEUTROABS 15.5*  --  13.2*  HGB 7.8* 6.5* 6.8*  HCT 25.9* 19.0* 21.2*  MCV 94.2  --  91.8  PLT 95*  --  84*   Cardiac Enzymes: No results for input(s): CKTOTAL, CKMB, CKMBINDEX, TROPONINI in the last 168 hours.  BNP (last 3 results) No results for input(s): PROBNP in the last 8760 hours. CBG: No results for input(s): GLUCAP in the last 168 hours.  Urinalysis    Component Value Date/Time   COLORURINE RED (A) 09/04/2016 1439   APPEARANCEUR CLOUDY (A) 09/04/2016 1439   LABSPEC >1.046  (H) 09/04/2016 1439   PHURINE 6.0 09/04/2016 1439   GLUCOSEU 50 (A) 09/04/2016 1439   HGBUR LARGE (A) 09/04/2016 1439   BILIRUBINUR NEGATIVE 09/04/2016 1439   BILIRUBINUR NEG 01/18/2013 0859   KETONESUR NEGATIVE 09/04/2016 1439   PROTEINUR 100 (A) 09/04/2016 1439   UROBILINOGEN negative 01/18/2013 0859   UROBILINOGEN 1.0 02/12/2012 1520   NITRITE NEGATIVE 09/04/2016 1439   LEUKOCYTESUR TRACE (A) 09/04/2016 1439      Radiographic Studies: Dg Chest Portable 1 View  Result Date: 09/26/2016 CLINICAL DATA:  Renal carcinoma.  Fatigue and dysphagia EXAM: PORTABLE CHEST 1 VIEW COMPARISON:  Chest radiograph August 28, 2016 and chest CT September 04, 2016 FINDINGS: Port-A-Cath tip is at the cavoatrial junction. There are areas of consolidation and loculated effusion on the right with multiple masses, largest measuring approximately 4 x 3 cm on the right. There are small more subtle nodular lesions in the left lung. No consolidation on the left. There is mild cardiomegaly with pulmonary vascularity within normal limits. There is extensive adenopathy in the right paratracheal and hilar regions. No bone lesions evident. IMPRESSION: Extensive metastatic disease with multiple nodular lesions, much more on the right than the left, as well as right sided adenopathy. Ill-defined consolidation loculated effusion on the right, unchanged. Stable cardiomegaly. No pneumothorax. Electronically Signed   By: Lowella Grip III M.D.   On: 09/26/2016 11:33     Assessment/Plan:   Principal Problem:   Encounter for palliative care Active Problems:   Symptomatic anemia   Right lower quadrant abdominal pain   Cancer of kidney, unspecified laterality ([place])  METASTATIC CANCER Long conversation with patient and multiple members of his family about goals for care. Patient was very clear that he no longer wanted to suffer because he was in intractable pain and the  swelling was terrible. He felt that he had "fought the good  fight" and was ready to "put it in the hands of the Lord". Patient told his family that he wanted to be comfortable that he wanted to be in less pain and he would like some anxiety medication. Patient did not want to continue further treatment although he didn't want to continue his blood transfusion. He stated he would like to be placed in hospice and be comfortable for what was left of his life. He also noted that he was having difficulty swallowing his pain medications as well as his anxiety medications. Pain medications were changed to oral liquid concentrate. Palliative care consultation was placed and Dr. Rowe Pavy came to see him. Hospice referrals made.   Other information:   DVT prophylaxis: Lovenox Discontinued Code Status: DO NOT RESUSCITATE Family Communication: 78 members of his family were present including his mother, wife, multiple sisters, nieces his son and his daughter-in-law. Disposition Plan: To be determined Consults called: Palliative care Admission status: observation  The medical decision making on this patient was of high complexity and the patient is at high risk for clinical deterioration, therefore this is a level 3 visit.  The medical decision making is of moderate complexity, therefore this is a level 2 visit.  Dewaine Oats Derek Jack [facility] Pager [phone] Cell: [phone]   If 7PM-7AM, please contact night-coverage [email or web address] Password [facility] 09/26/2016, 6:19 PM

## 2016-09-26 NOTE — ED Notes (Signed)
Abnormal lab result MD [place] have been made aware  

## 2016-09-26 NOTE — Telephone Encounter (Signed)
Wife called that pt was supposed to go to [facility] for blood but she took him to ER at [facility] instead b/c he is in so much pain. She did mention to ER staff that he was supposed to receive blood today. She did mention hospice but was asking if he could still get treatment under hospice. She then stated they wanted to do the 2 more treatments planned and the rescanning to see the efficacy then would decide further. She did mention he is afraid of dying but he is tired.

## 2016-09-26 NOTE — ED Notes (Signed)
ED TO INPATIENT HANDOFF REPORT  Name/Age/Gender David [name] 56 y.o. male  Home/SNF/Other Home  Chief Complaint Right Side pain  Code Status History    Date Active Date Inactive Code Status Order ID Comments User Context   07/13/2016  4:07 PM 07/15/2016  1:16 PM Full Code [id number]  Thurnell Lose, MD Inpatient   11/14/2015  3:56 PM 11/15/2015  6:57 PM Full Code [id number]  Murlean Iba, MD Inpatient   10/24/2015 12:18 PM 10/28/2015  3:56 PM Full Code [id number]  Caren Griffins, MD ED      Level of Care/Admitting Diagnosis ED Disposition    ED Disposition Condition Comment   [facility] [facility] Area: [facility] [[id number]]  Level of Care: Med-Surg [16]  Diagnosis: Symptomatic anemia [4827078]  Admitting Physician: Vashti Hey [[id number]]  Attending Physician: Vashti Hey [[id number]]  PT Class (Do Not Modify): Observation [104]  PT Acc Code (Do Not Modify): Observation [10022]       Medical History Past Medical History:  Diagnosis Date  . Cancer ([place])   . Dental caries   . Diabetes mellitus, type II ([place]) 01/2012  . Hyperlipidemia   . Hypertension   . Influenza vaccine refused 01/18/2013  . Kidney cancer, primary, with metastasis from kidney to other site [facility])   . Obesity   . Pneumococcal vaccine refused 01/18/2013  . Scoliosis   . Tobacco use   . Vision impairment    left, due to prior trauma    Allergies No Known Allergies  IV Location/Drains/Wounds Patient Lines/Drains/Airways Status   Active Line/Drains/Airways    Name:   Placement date:   Placement time:   Site:   Days:   Implanted Port 11/26/15 Right Chest  11/26/15    1017    Chest    305   Wound / Incision (Open or Dehisced) 08/28/16 Puncture Other (Comment) Right  08/28/16    1240    Other (Comment)    29          Labs/Imaging Results for orders placed or performed during the hospital encounter of 09/26/16 (from the past 48 hour(s))  I-Stat CG4 Lactic  Acid, ED  (not at  [facility])     Status: None   Collection Time: 09/26/16 11:21 AM  Result Value Ref Range   Lactic Acid, Venous 1.09 0.5 - 1.9 mmol/L  I-stat chem 8, ed     Status: Abnormal   Collection Time: 09/26/16 11:21 AM  Result Value Ref Range   Sodium 137 135 - 145 mmol/L   Potassium 4.3 3.5 - 5.1 mmol/L   Chloride 102 101 - 111 mmol/L   BUN 34 (H) 6 - 20 mg/dL   Creatinine, Ser 1.20 0.61 - 1.24 mg/dL   Glucose, Bld 156 (H) 65 - 99 mg/dL   Calcium, Ion 1.07 (L) 1.15 - 1.40 mmol/L   TCO2 25 0 - 100 mmol/L   Hemoglobin 6.5 (LL) 13.0 - 17.0 g/dL   HCT 19.0 (L) 39.0 - 52.0 %   Comment NOTIFIED PHYSICIAN   Comprehensive metabolic panel     Status: Abnormal   Collection Time: 09/26/16 11:28 AM  Result Value Ref Range   Sodium 137 135 - 145 mmol/L   Potassium 4.3 3.5 - 5.1 mmol/L   Chloride 104 101 - 111 mmol/L   CO2 25 22 - 32 mmol/L   Glucose, Bld 159 (H) 65 - 99 mg/dL   BUN 38 (H) 6 - 20 mg/dL  Creatinine, Ser 1.11 0.61 - 1.24 mg/dL   Calcium 7.3 (L) 8.9 - 10.3 mg/dL   Total Protein 5.5 (L) 6.5 - 8.1 g/dL   Albumin 1.3 (L) 3.5 - 5.0 g/dL   AST 9 (L) 15 - 41 U/L   ALT 9 (L) 17 - 63 U/L   Alkaline Phosphatase 124 38 - 126 U/L   Total Bilirubin 0.7 0.3 - 1.2 mg/dL   GFR calc non Af Amer >60 >60 mL/min   GFR calc Af Amer >60 >60 mL/min    Comment: (NOTE) The eGFR has been calculated using the CKD EPI equation. This calculation has not been validated in all clinical situations. eGFR's persistently <60 mL/min signify possible Chronic Kidney Disease.    Anion gap 8 5 - 15  CBC WITH DIFFERENTIAL     Status: Abnormal   Collection Time: 09/26/16 11:28 AM  Result Value Ref Range   WBC 16.2 (H) 4.0 - 10.5 K/uL   RBC 2.31 (L) 4.22 - 5.81 MIL/uL   Hemoglobin 6.8 (LL) 13.0 - 17.0 g/dL    Comment: CRITICAL RESULT CALLED TO, READ BACK BY AND VERIFIED WITH: HAMBY,M @ 1207 ON [id number] BY POTEAT,S    HCT 21.2 (L) 39.0 - 52.0 %   MCV 91.8 78.0 - 100.0 fL   MCH 29.4 26.0 - 34.0 pg    MCHC 32.1 30.0 - 36.0 g/dL   RDW 15.7 (H) 11.5 - 15.5 %   Platelets 84 (L) 150 - 400 K/uL    Comment: REPEATED TO VERIFY SPECIMEN CHECKED FOR CLOTS PLATELET COUNT CONFIRMED BY SMEAR    Neutrophils Relative % 81 %   Neutro Abs 13.2 (H) 1.7 - 7.7 K/uL   Lymphocytes Relative 13 %   Lymphs Abs 2.1 0.7 - 4.0 K/uL   Monocytes Relative 6 %   Monocytes Absolute 0.9 0.1 - 1.0 K/uL   Eosinophils Relative 0 %   Eosinophils Absolute 0.0 0.0 - 0.7 K/uL   Basophils Relative 0 %   Basophils Absolute 0.0 0.0 - 0.1 K/uL  Protime-INR     Status: Abnormal   Collection Time: 09/26/16 11:28 AM  Result Value Ref Range   Prothrombin Time 18.5 (H) 11.4 - 15.2 seconds   INR 1.52   Prepare RBC     Status: None   Collection Time: 09/26/16 11:29 AM  Result Value Ref Range   Order Confirmation ORDER PROCESSED BY BLOOD BANK    Dg Chest Portable 1 View  Result Date: 09/26/2016 CLINICAL DATA:  Renal carcinoma.  Fatigue and dysphagia EXAM: PORTABLE CHEST 1 VIEW COMPARISON:  Chest radiograph August 28, 2016 and chest CT September 04, 2016 FINDINGS: Port-A-Cath tip is at the cavoatrial junction. There are areas of consolidation and loculated effusion on the right with multiple masses, largest measuring approximately 4 x 3 cm on the right. There are small more subtle nodular lesions in the left lung. No consolidation on the left. There is mild cardiomegaly with pulmonary vascularity within normal limits. There is extensive adenopathy in the right paratracheal and hilar regions. No bone lesions evident. IMPRESSION: Extensive metastatic disease with multiple nodular lesions, much more on the right than the left, as well as right sided adenopathy. Ill-defined consolidation loculated effusion on the right, unchanged. Stable cardiomegaly. No pneumothorax. Electronically Signed   By: Lowella Grip III M.D.   On: 09/26/2016 11:33    Pending Labs [employer]    Start     Ordered   09/26/16 1109  Urine culture  STAT,   STAT      09/26/16 1108   09/26/16 1108  Blood Culture (routine x 2)  BLOOD CULTURE X 2,   STAT     09/26/16 1108   09/26/16 1108  Urinalysis, Routine w reflex microscopic  STAT,   STAT     09/26/16 1108      Isolation Precautions No active isolations  Vitals/Pain Today's Vitals   09/26/16 1100 09/26/16 1130 09/26/16 1136 09/26/16 1258  BP: (!) 86/56 (!) 87/54 (!) 87/54 (!) 88/56  Pulse: 90 92 91 94  Resp: 13 16 12  (!) 23  Temp:    98.3 F (36.8 C)  TempSrc:    Rectal  SpO2: 91% 98% 96% 97%  PainSc:        Medications Medications  sodium chloride 0.9 % bolus 1,000 mL (0 mLs Intravenous Stopped 09/26/16 1237)  0.9 %  sodium chloride infusion ( Intravenous New Bag/Given 09/26/16 1237)    Mobility non-ambulatory

## 2016-09-26 NOTE — ED Notes (Signed)
ED Provider at bedside. 

## 2016-09-26 NOTE — ED Notes (Signed)
MD at beside. Patient asked by MD what he wants as far as his treatment, and patient states "I just want to be made comfortable." Witnessed by MD, this nurse, and patient's wife. MD requests the patient's family come to the hospital for a family meeting to discuss the patient's wishes. MD states this meeting is to take place in patient's inpatient room.

## 2016-09-27 DIAGNOSIS — Z515 Encounter for palliative care: Secondary | ICD-10-CM

## 2016-09-27 DIAGNOSIS — C659 Malignant neoplasm of unspecified renal pelvis: Secondary | ICD-10-CM

## 2016-09-27 DIAGNOSIS — R1031 Right lower quadrant pain: Secondary | ICD-10-CM | POA: Diagnosis not present

## 2016-09-27 DIAGNOSIS — C649 Malignant neoplasm of unspecified kidney, except renal pelvis: Secondary | ICD-10-CM | POA: Diagnosis not present

## 2016-09-27 DIAGNOSIS — D649 Anemia, unspecified: Secondary | ICD-10-CM | POA: Diagnosis not present

## 2016-09-27 LAB — TYPE AND SCREEN
ABO/RH(D): B POS
Antibody Screen: NEGATIVE
UNIT DIVISION: 0

## 2016-09-27 LAB — BPAM RBC
Blood Product Expiration Date: 201808282359
ISSUE DATE / TIME: 201808101247
Unit Type and Rh: 7300

## 2016-09-27 MED ORDER — CLONAZEPAM 0.5 MG PO TABS: 0.5000 mg | ORAL_TABLET | Freq: Two times a day (BID) | ORAL | 0 refills | Status: AC | PRN

## 2016-09-27 MED ORDER — MORPHINE SULFATE (CONCENTRATE) 10 MG/0.5ML PO SOLN: 10.0000 mg | ORAL | 0 refills | Status: AC | PRN

## 2016-09-27 MED ORDER — POLYETHYLENE GLYCOL 3350 17 G PO PACK
17.0000 g | PACK | Freq: Every day | ORAL | Status: DC
  Administered 2016-09-27: 17 g via ORAL
  Filled 2016-09-27: qty 1

## 2016-09-27 MED ORDER — MORPHINE SULFATE ER 30 MG PO TBCR: 30.0000 mg | EXTENDED_RELEASE_TABLET | Freq: Two times a day (BID) | ORAL | 0 refills | Status: AC

## 2016-09-27 NOTE — Clinical Social Work Note (Signed)
SW received request to assist patient/family with residential hospice placement. SW met briefly with family to determine hospice home preference which is [facility].  Referral completed to Erling Conte, [place] who is on the unit to talk to family. Will follow up with family to complete assessment after hospice home assessment is completed.  Lorie [place]. Pauline Good, [place]

## 2016-09-27 NOTE — Progress Notes (Signed)
Daily Progress Note   Patient Name: David [name]       Date: 09/27/2016 DOB: [birth year]  Age: 56 y.o. MRN#: [id number] Attending Physician: Domenic Polite, MD Primary Care Physician: Carlena Hurl, PA-C Admit Date: 09/26/2016  Reason for Consultation/Follow-up: Terminal Care  Subjective:  less alert/awake Appears comfortable Wife at bedside See below  Length of Stay: 0  Current Medications: Scheduled Meds:  . clonazePAM  0.5 mg Oral Daily  . feeding supplement (ENSURE ENLIVE)  237 mL Oral TID BM  . megestrol  800 mg Oral Daily  . mirtazapine  15 mg Oral BID  . morphine CONCENTRATE  20 mg Oral Q6H WA  . polyethylene glycol  17 g Oral Daily    Continuous Infusions:   PRN Meds: acetaminophen **OR** acetaminophen, bisacodyl, glucose, lidocaine-prilocaine, LORazepam, morphine injection, morphine CONCENTRATE, sodium phosphate, traZODone  Physical Exam         Weak chronically ill appearing S1 S2 Shallow regular breathing Abdomen is distended Edema  Vital Signs: BP (!) 90/52 (BP Location: Right Arm)   Pulse 95   Temp 98 F (36.7 C) (Oral)   Resp 16   Ht 5\' 9"  (1.753 m)   Wt 81.6 kg (180 lb)   SpO2 98%   BMI 26.58 kg/m  SpO2: SpO2: 98 % O2 Device: O2 Device: Not Delivered O2 Flow Rate:    Intake/output summary:  Intake/Output Summary (Last 24 hours) at 09/27/16 0905 Last data filed at 09/26/16 1652  Gross per 24 hour  Intake             1650 ml  Output                0 ml  Net             1650 ml   LBM: Last BM Date:  (PTA) Baseline Weight: Weight: 83 kg (183 lb) Most recent weight: Weight: 81.6 kg (180 lb)       Palliative Assessment/Data:    Flowsheet Rows     Most Recent Value  Intake Tab  Referral Department  Hospitalist  Unit at Time of  Referral  Oncology Unit  Palliative Care Primary Diagnosis  Cancer  Palliative Care Type  New Palliative care  Reason for referral  Pain, Non-pain Symptom, End of Life Care Assistance, Counsel  Regarding Hospice  Date first seen by Palliative Care  09/26/16  Clinical Assessment  Palliative Performance Scale Score  30%  Pain Max last 24 hours  6  Pain Min Last 24 hours  5  Dyspnea Max Last 24 Hours  5  Dyspnea Min Last 24 hours  4  Nausea Max Last 24 Hours  3  Nausea Min Last 24 Hours  2  Anxiety Max Last 24 Hours  9  Anxiety Min Last 24 Hours  6  Psychosocial & Spiritual Assessment  Palliative Care Outcomes  Patient/Family meeting held?  Yes  Who was at the meeting?  patient, wife, son daughter, daughter in law, brother.   Palliative Care Outcomes  Clarified goals of care      Patient Active Problem List   Diagnosis Date Noted  . Right lower quadrant abdominal pain   . Cancer of kidney, unspecified laterality ([place])   . Encounter for palliative care   . Anemia 07/14/2016  . Abscess of buttock, right 03/24/2016  . Elevated CK 03/24/2016  . Myalgia 03/24/2016  . Edema 03/24/2016  . Folliculitis 23/55/7322  . Rash 03/24/2016  . Acute serous otitis media 03/17/2016  . Port catheter in place 12/11/2015  . Protein-calorie malnutrition, severe 11/15/2015  . Hyponatremia 11/14/2015  . Hypoalbuminemia due to protein-calorie malnutrition ([place]) 11/14/2015  . Generalized weakness 11/14/2015  . Mild dehydration 11/14/2015  . SIRS (systemic inflammatory response syndrome) ([facility])   . S/P thoracentesis   . Fever, unspecified 10/25/2015  . Pleural effusion on right 10/25/2015  . Pleural mass 10/25/2015  . Symptomatic anemia 10/24/2015  . 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 Abnormal CT of the chest 10/03/2015  . Goals of care, counseling/discussion 10/03/2015  . Creatinine elevation 10/03/2015  . Pulmonary nodules 10/03/2015  . Loss of weight 10/03/2015  .  Lymphadenopathy 10/03/2015  . History of colonic polyps 10/03/2015  . Leukocytosis 10/03/2015  . Anemia, unspecified 10/03/2015  . Former smoker 10/03/2015  . DM2 (diabetes mellitus, type 2) ([place]) 01/18/2013  . 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  Continue comfort care, using PO and IV PRN Morphine  Comfort feeds/gentle POs with precautions, not eating much, discussed end of life signs and symptoms with wife at bedside this am.   Prognosis few days to upto less than 2 weeks at this point, discussed with wife.   Goals of Care and Additional Recommendations:  Limitations on Scope of Treatment: Full Comfort Care  Code Status:    Code Status Orders        Start     Ordered   09/26/16 1507  Do not attempt resuscitation (DNR)  Continuous    Question Answer Comment  In the event of cardiac or respiratory ARREST Do not call a "code blue"   In the event of cardiac or respiratory ARREST Do not perform Intubation, CPR, defibrillation or ACLS   In the event of cardiac or respiratory ARREST Use medication by any route, position, wound care, and other measures to relive pain and suffering. May use oxygen, suction and manual treatment of airway obstruction as needed for comfort.      09/26/16 1507    Code Status History    Date Active Date Inactive  Code Status Order ID Comments User Context   09/26/2016  2:53 PM 09/26/2016  3:07 PM DNR [id number]  Vashti Hey, MD Inpatient   07/13/2016  4:07 PM 07/15/2016  1:16 PM Full Code [id number]  Thurnell Lose, MD Inpatient   11/14/2015  3:56 PM 11/15/2015  6:57 PM Full Code [id number]  Murlean Iba, MD Inpatient   10/24/2015 12:18 PM 10/28/2015  3:56 PM Full  Code [id number]  Caren Griffins, MD ED       Prognosis:   < 2 weeks  Discharge Planning:  Hospice facility: choices discussed, patient and family request only for [facility].   Care plan was discussed with  wife  Thank you for allowing the Palliative Medicine Team to assist in the care of this patient.   Time In: 8.30 Time Out: 9.05 Total Time 35 Prolonged Time Billed  no       Greater than 50%  of this time was spent counseling and coordinating care related to the above assessment and plan. [place]  Loistine Chance, MD  Please contact Palliative Medicine Team phone at [phone] for questions and concerns.

## 2016-09-27 NOTE — Progress Notes (Signed)
Port flushed and capped prior to discharge.

## 2016-09-27 NOTE — Clinical Social Work Note (Signed)
Discharge note: CSW notified by Erling Conte- LCSW Liaison for [facility] that patient has been accepted and she has completed paperwork with the family. SW met with patient, wife, son and daughter who indicated that they were very pleased with the bed offer and are ready for d/c to [facility]. SW spoke to Dr. Broadus John who completed d/c summary, DNR form sign.  Nursing notified and report number given. Awaiting EMS transport. No further SW needs identified.  CSW signing off.  Lorie Phenix. Pauline Good, [place]

## 2016-09-27 NOTE — Progress Notes (Signed)
PROGRESS NOTE    David [name]  GYF:[id number] DOB: Jul 11, 1960 DOA: 09/26/2016 PCP: Carlena Hurl, PA-C  Brief Narrative:David [name] is an 56 y.o. male With widely metastatic renal cell carcinoma is brought in for blood transfusion which was originally scheduled to be done as an outpatient. However patient was noted to be very hypotensive so was brought to the emergency room instead.  patient himself notes that he is in pain and would like to initiate comfort care although he is willing to undergo his blood transfusion. Seen by palliative care, decision made for comfort measures   Assessment & Plan:   Principal Problem:   Encounter for palliative care   Widely metastatic kidney cancer   Symptomatic anemia   Right lower quadrant abdominal pain    -Status post blood transfusion -Now decision made for comfort measures only -Palliative following prognosis less than 2 weeks -Social work consult for residential hospice placement   DVT prophylaxis: Comfort care Code Status: DO NOT RESUSCITATE Family Communication: Wife at bedside Disposition Plan: Residential hospice  Consultants:  Palliative medicine  Subjective: Feels weak but no other specific complaints  Objective: Vitals:   09/26/16 1330 09/26/16 1406 09/26/16 1652 09/26/16 2128  BP: 90/62 94/60 120/66 (!) 90/52  Pulse: 98 98 66 95  Resp: 14 18 18 16   Temp:  98.1 F (36.7 C) 98.2 F (36.8 C) 98 F (36.7 C)  TempSrc:  Oral Oral Oral  SpO2: 90% 100% 99% 98%  Weight:  81.6 kg (180 lb)    Height:  5\' 9"  (1.753 m)      Intake/Output Summary (Last 24 hours) at 09/27/16 1100 Last data filed at 09/26/16 1652  Gross per 24 hour  Intake             1650 ml  Output                0 ml  Net             1650 ml   Filed Weights   09/26/16 1319 09/26/16 1406  Weight: 83 kg (183 lb) 81.6 kg (180 lb)    Examination:  General exam: frail cachectic ill, ill appearing Respiratory system: decreased BS at  bases Cardiovascular system: S1 & S2 heard, tachycardic Gastrointestinal system: Abdomen is soft obese distended with anasarca Central nervous system: Somnolent. Extremities: 2+ edema Skin: No rashes, lesions or ulcers Psychiatry: Flat affect    Data Reviewed:   CBC:  Recent Labs Lab 09/25/16 1247 09/26/16 1121 09/26/16 1128 09/26/16 1800  WBC 18.5*  --  16.2* 16.6*  NEUTROABS 15.5*  --  13.2*  --   HGB 7.8* 6.5* 6.8* 7.7*  HCT 25.9* 19.0* 21.2* 23.5*  MCV 94.2  --  91.8 91.4  PLT 95*  --  84* 92*   Basic Metabolic Panel:  Recent Labs Lab 09/25/16 1247 09/26/16 1121 09/26/16 1128  NA 138 137 137  K 4.9 4.3 4.3  CL  --  102 104  CO2 26  --  25  GLUCOSE 183* 156* 159*  BUN 31.1* 34* 38*  CREATININE 1.0 1.20 1.11  CALCIUM 8.1*  --  7.3*   GFR: Estimated Creatinine Clearance: 73.4 mL/min (by C-G formula based on SCr of 1.11 mg/dL). Liver Function Tests:  Recent Labs Lab 09/25/16 1247 09/26/16 1128  AST 8 9*  ALT 8 9*  ALKPHOS 180* 124  BILITOT 0.77 0.7  PROT 5.7* 5.5*  ALBUMIN 1.0* 1.3*   No results for input(s): LIPASE,  AMYLASE in the last 168 hours. No results for input(s): AMMONIA in the last 168 hours. Coagulation Profile:  Recent Labs Lab 09/26/16 1128  INR 1.52   Cardiac Enzymes: No results for input(s): CKTOTAL, CKMB, CKMBINDEX, TROPONINI in the last 168 hours. BNP (last 3 results) No results for input(s): PROBNP in the last 8760 hours. HbA1C: No results for input(s): HGBA1C in the last 72 hours. CBG: No results for input(s): GLUCAP in the last 168 hours. Lipid Profile: No results for input(s): CHOL, HDL, LDLCALC, TRIG, CHOLHDL, LDLDIRECT in the last 72 hours. Thyroid Function Tests: No results for input(s): TSH, T4TOTAL, FREET4, T3FREE, THYROIDAB in the last 72 hours. Anemia Panel: No results for input(s): VITAMINB12, FOLATE, FERRITIN, TIBC, IRON, RETICCTPCT in the last 72 hours. Urine analysis:    Component Value Date/Time    COLORURINE RED (A) 09/04/2016 1439   APPEARANCEUR CLOUDY (A) 09/04/2016 1439   LABSPEC >1.046 (H) 09/04/2016 1439   PHURINE 6.0 09/04/2016 1439   GLUCOSEU 50 (A) 09/04/2016 1439   HGBUR LARGE (A) 09/04/2016 1439   BILIRUBINUR NEGATIVE 09/04/2016 1439   BILIRUBINUR NEG 01/18/2013 0859   KETONESUR NEGATIVE 09/04/2016 1439   PROTEINUR 100 (A) 09/04/2016 1439   UROBILINOGEN negative 01/18/2013 0859   UROBILINOGEN 1.0 02/12/2012 1520   NITRITE NEGATIVE 09/04/2016 1439   LEUKOCYTESUR TRACE (A) 09/04/2016 1439   Sepsis Labs: @LABRCNTIP (procalcitonin:4,lacticidven:4)  )No results found for this or any previous visit (from the past 240 hour(s)).       Radiology Studies: Dg Chest Portable 1 View  Result Date: 09/26/2016 CLINICAL DATA:  Renal carcinoma.  Fatigue and dysphagia EXAM: PORTABLE CHEST 1 VIEW COMPARISON:  Chest radiograph August 28, 2016 and chest CT September 04, 2016 FINDINGS: Port-A-Cath tip is at the cavoatrial junction. There are areas of consolidation and loculated effusion on the right with multiple masses, largest measuring approximately 4 x 3 cm on the right. There are small more subtle nodular lesions in the left lung. No consolidation on the left. There is mild cardiomegaly with pulmonary vascularity within normal limits. There is extensive adenopathy in the right paratracheal and hilar regions. No bone lesions evident. IMPRESSION: Extensive metastatic disease with multiple nodular lesions, much more on the right than the left, as well as right sided adenopathy. Ill-defined consolidation loculated effusion on the right, unchanged. Stable cardiomegaly. No pneumothorax. Electronically Signed   By: Lowella Grip III M.D.   On: 09/26/2016 11:33        Scheduled Meds: . clonazePAM  0.5 mg Oral Daily  . feeding supplement (ENSURE ENLIVE)  237 mL Oral TID BM  . megestrol  800 mg Oral Daily  . mirtazapine  15 mg Oral BID  . morphine CONCENTRATE  20 mg Oral Q6H WA  .  polyethylene glycol  17 g Oral Daily   Continuous Infusions:   LOS: 0 days    Time spent: 59min    Domenic Polite, MD [facility] Pager [phone]  If 7PM-7AM, please contact night-coverage [email or web address] Password [email or web address] 09/27/2016, 11:00 AM

## 2016-09-27 NOTE — Discharge Summary (Signed)
Physician Discharge Summary  David [name] TGG:[id number] DOB: [birth year] DOA: 09/26/2016  PCP: Carlena Hurl, PA-C  Admit date: 09/26/2016 Discharge date: 09/27/2016  Time spent: 35 minutes  Recommendations for Outpatient Follow-up:  1. 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 However patient was noted to be very hypotensive so was brought to the emergency room instead.  patient himself notes that he is in pain and would like to initiate comfort care although he is willing to undergo his blood transfusion. Seen by palliative care, decision made for comfort measures   Hospital Course:  56/M with widely metastatic Transitional cell carcinoma of left kidney, severe malnutrition, debility,  anemia, anasarca was admitted to [facility] long hospital on 8/9 -At his last visit yesterday with Dr.Shadad, he felt his cancer was incurable/progressing and prognosis was limited to likely weeks -He was given a unit of blood after admission yesterday and palliative consult was obtained after discussion with patient and family decision was made for comfort care -He was discharged to residential hospice for end-of-life care  Consultations:  Palliative medicine  Discharge Exam: Vitals:   09/26/16 1652 09/26/16 2128  BP: 120/66 (!) 90/52  Pulse: 66 95  Resp: 18 16  Temp: 98.2 F (36.8  C) 98 F (36.7 C)  SpO2: 99% 98%    General: frail, cachectic male Cardiovascular: S1S2/RR Respiratory: poor air movement, diminished BS  Discharge Instructions   Discharge Instructions    Discharge instructions    Complete by:  As directed    Comfort feeds, comfort care   Increase activity slowly    Complete by:  As directed      Current Discharge Medication List    START taking these medications   Details  Morphine Sulfate (MORPHINE CONCENTRATE) 10 MG/0.5ML SOLN concentrated solution Take 0.5 mLs (10 mg total) by mouth every 2 (two) hours as needed for severe pain. Qty: 10 mL, Refills: 0      CONTINUE these medications which have CHANGED   Details  clonazePAM (KLONOPIN) 0.5 MG tablet Take 1 tablet (0.5 mg total) by mouth 2 (two) times daily as needed for anxiety. Take one tablet by mouth 2 times a day if needed for anxiety. Qty: 20 tablet, Refills: 0   Associated Diagnoses: Primary malignant neoplasm of renal pelvis, unspecified laterality (HCC)    morphine (MS CONTIN) 30 MG 12 hr tablet Take 1 tablet (30 mg total) by mouth every 12 (twelve) hours. Qty: 20 tablet, Refills: 0      CONTINUE these medications which have NOT CHANGED   Details  doxylamine, Sleep, (UNISOM) 25 MG tablet Take 50 mg by mouth at bedtime as needed for sleep.    feeding supplement, ENSURE ENLIVE, (ENSURE ENLIVE) LIQD Take 237 mLs by mouth 3 (three) times daily between meals. Qty: 237 mL, Refills: 12    mirtazapine (REMERON) 15 MG tablet Take 1 tablet (15 mg total)  by mouth 2 (two) times daily. Qty: 60 tablet, Refills: 11    omeprazole (PRILOSEC) 20 MG capsule Take 20 mg by mouth daily as needed (for acid reflux).     senna-docusate (SENNA S) 8.6-50 MG tablet Take 2 tablets by mouth 2 (two) times daily. Qty: 120 tablet, Refills: 3      STOP taking these medications     carvedilol (COREG) 3.125 MG tablet      furosemide (LASIX) 20 MG tablet      glucose 4 GM chewable tablet       insulin glargine (LANTUS) 100 UNIT/ML injection      lidocaine-prilocaine (EMLA) cream      megestrol (MEGACE ES) 625 MG/5ML suspension      metFORMIN (GLUCOPHAGE) 500 MG tablet      naproxen sodium (ANAPROX) 220 MG tablet      testosterone cypionate (DEPOTESTOSTERONE CYPIONATE) 200 MG/ML injection      vitamin B-12 (CYANOCOBALAMIN) 1000 MCG tablet      chlorproMAZINE (THORAZINE) 50 MG tablet      oxyCODONE (OXY IR/ROXICODONE) 5 MG immediate release tablet        No Known Allergies    The results of significant diagnostics from this hospitalization (including imaging, microbiology, ancillary and laboratory) are listed below for reference.    Significant Diagnostic Studies: Ct Chest W Contrast  Result Date: 09/04/2016 CLINICAL DATA:  56 y/o  M; metastatic renal cancer. EXAM: CT CHEST, ABDOMEN, AND PELVIS WITH CONTRAST TECHNIQUE: Multidetector CT imaging of the chest, abdomen and pelvis was performed following the standard protocol during bolus administration of intravenous contrast. CONTRAST:  1100mL ISOVUE-300 IOPAMIDOL (ISOVUE-300) INJECTION 61% COMPARISON:  CT of chest, abdomen, and pelvis dated 06/18/2016. FINDINGS: CT CHEST FINDINGS Cardiovascular: Normal heart size. No pericardial effusion. Normal caliber thoracic aorta. Right port catheter tip projects over the right cavoatrial junction. Mediastinum/Nodes: Right upper peritracheal lymph node measures 14 x 16 mm (series 2, image 13). Right lower paratracheal lymphadenopathy measuring 16 x 27 mm (series 2, image 19). Lungs/Pleura: Marked increase in extensive right hemithorax pleural metastatic disease. Pulmonary nodules: 1. Right upper lobe, 10 mm, series 3, image 53. 2. Left lower lobe, 5 mm, series 3, image 53. 3. Left lower lobe, 11 mm, series 3, image 89. 4. Left lower lobe, 7 mm, series 3, image 96. Musculoskeletal: The scapula and L1 vertebral body sclerotic lesions are stable. CT ABDOMEN PELVIS FINDINGS Hepatobiliary: No focal  liver lesion. Cholelithiasis. No intra or extrahepatic biliary ductal dilatation. Pancreas: Unremarkable. No pancreatic ductal dilatation or surrounding inflammatory changes. Spleen: Normal in size without focal abnormality. Adrenals/Urinary Tract: Ill-defined mass within the left kidney interpolar region is poorly marginated spanning approximately 5.8 cm. No right kidney lesion identified. Normal adrenal glands. No hydronephrosis. Normal urinary bladder. Stomach/Bowel: Stomach is within normal limits. Appendix appears normal. No evidence of bowel wall thickening, distention, or inflammatory changes. Scattered diverticulosis of sigmoid colon. Vascular/Lymphatic: 26 x 16 mm lymphadenopathy near left renal hilum just above ligament of Treitz. No vascular abnormality. Reproductive: Prostate is unremarkable. Other: No abdominal wall hernia or abnormality. No abdominopelvic ascites. Musculoskeletal: Multiple stable sclerotic lesions in the bones, predominantly within the pelvis, and additional prominent lesion in the left scapula and L1 vertebral body. IMPRESSION: 1. Marked progression of right hemithorax pleural metastatic disease. 2. Increased in size and number of multiple pulmonary nodules, likely metastasis. 3. Increased size of left renal mass. 4. Increased right paratracheal adenopathy. Increased size of adenopathy in the abdomen superior to ligament of  Treitz. 5. Multiple stable sclerotic bone lesions, likely representing metastasis. Electronically Signed   By: Kristine Garbe M.D.   On: 09/04/2016 15:09   Ct Abdomen Pelvis W Contrast  Result Date: 09/04/2016 CLINICAL DATA:  56 y/o  M; metastatic renal cancer. EXAM: CT CHEST, ABDOMEN, AND PELVIS WITH CONTRAST TECHNIQUE: Multidetector CT imaging of the chest, abdomen and pelvis was performed following the standard protocol during bolus administration of intravenous contrast. CONTRAST:  175mL ISOVUE-300 IOPAMIDOL (ISOVUE-300) INJECTION 61% COMPARISON:   CT of chest, abdomen, and pelvis dated 06/18/2016. FINDINGS: CT CHEST FINDINGS Cardiovascular: Normal heart size. No pericardial effusion. Normal caliber thoracic aorta. Right port catheter tip projects over the right cavoatrial junction. Mediastinum/Nodes: Right upper peritracheal lymph node measures 14 x 16 mm (series 2, image 13). Right lower paratracheal lymphadenopathy measuring 16 x 27 mm (series 2, image 19). Lungs/Pleura: Marked increase in extensive right hemithorax pleural metastatic disease. Pulmonary nodules: 1. Right upper lobe, 10 mm, series 3, image 53. 2. Left lower lobe, 5 mm, series 3, image 53. 3. Left lower lobe, 11 mm, series 3, image 89. 4. Left lower lobe, 7 mm, series 3, image 96. Musculoskeletal: The scapula and L1 vertebral body sclerotic lesions are stable. CT ABDOMEN PELVIS FINDINGS Hepatobiliary: No focal liver lesion. Cholelithiasis. No intra or extrahepatic biliary ductal dilatation. Pancreas: Unremarkable. No pancreatic ductal dilatation or surrounding inflammatory changes. Spleen: Normal in size without focal abnormality. Adrenals/Urinary Tract: Ill-defined mass within the left kidney interpolar region is poorly marginated spanning approximately 5.8 cm. No right kidney lesion identified. Normal adrenal glands. No hydronephrosis. Normal urinary bladder. Stomach/Bowel: Stomach is within normal limits. Appendix appears normal. No evidence of bowel wall thickening, distention, or inflammatory changes. Scattered diverticulosis of sigmoid colon. Vascular/Lymphatic: 26 x 16 mm lymphadenopathy near left renal hilum just above ligament of Treitz. No vascular abnormality. Reproductive: Prostate is unremarkable. Other: No abdominal wall hernia or abnormality. No abdominopelvic ascites. Musculoskeletal: Multiple stable sclerotic lesions in the bones, predominantly within the pelvis, and additional prominent lesion in the left scapula and L1 vertebral body. IMPRESSION: 1. Marked progression of  right hemithorax pleural metastatic disease. 2. Increased in size and number of multiple pulmonary nodules, likely metastasis. 3. Increased size of left renal mass. 4. Increased right paratracheal adenopathy. Increased size of adenopathy in the abdomen superior to ligament of Treitz. 5. Multiple stable sclerotic bone lesions, likely representing metastasis. Electronically Signed   By: Kristine Garbe M.D.   On: 09/04/2016 15:09   Ct Biopsy  Result Date: 08/28/2016 INDICATION: 56 year old male with a history of urothelial carcinoma. Likely pleural metastases EXAM: CT BIOPSY MEDICATIONS: None. ANESTHESIA/SEDATION: Moderate (conscious) sedation was employed during this procedure. A total of Versed 1.0 mg and Fentanyl 50 mcg was administered intravenously. Moderate Sedation Time: 10 minutes. The patient's level of consciousness and vital signs were monitored continuously by radiology nursing throughout the procedure under my direct supervision. FLUOROSCOPY TIME:  CT COMPLICATIONS: None PROCEDURE: The procedure, risks, benefits, and alternatives were explained to the patient and the patient's family. Specific risks that were addressed included bleeding, infection, pneumothorax, need for further procedure including chest tube placement, chance of delayed pneumothorax or hemorrhage, hemoptysis, nondiagnostic sample, cardiopulmonary collapse, death. Questions regarding the procedure were encouraged and answered. The patient understands and consents to the procedure. Patient was positioned in the left decubitus position on the CT gantry table and a scout CT of the chest was performed for planning purposes. Once angle of approach was determined, the skin and subcutaneous tissues  this scan was prepped and draped in the usual sterile fashion, and a sterile drape was applied covering the operative field. A sterile gown and sterile gloves were used for the procedure. Local anesthesia was provided with 1% Lidocaine.  The skin and subcutaneous tissues were infiltrated 1% lidocaine for local anesthesia, and a small stab incision was made with an 11 blade scalpel. Using CT guidance, a 17 gauge trocar needle was advanced into the soft tissuetarget of the right posterior pleura. After confirmation of the tip, separate 18 gauge core biopsies were performed. These were placed into solution for transportation to the lab. Needle was then removed. Final CT image was performed. Patient tolerated the procedure well and remained hemodynamically stable throughout. No complications were encountered and no significant blood loss was encounter IMPRESSION: Status post CT-guided biopsy of right-sided pleural soft tissue. Tissue specimen sent to pathology for complete histopathologic analysis. Signed, Dulcy Fanny. Earleen Newport, DO [facility] Electronically Signed   By: Corrie Mckusick D.O.   On: 08/28/2016 13:41   Dg Chest Portable 1 View  Result Date: 09/26/2016 CLINICAL DATA:  Renal carcinoma.  Fatigue and dysphagia EXAM: PORTABLE CHEST 1 VIEW COMPARISON:  Chest radiograph August 28, 2016 and chest CT September 04, 2016 FINDINGS: Port-A-Cath tip is at the cavoatrial junction. There are areas of consolidation and loculated effusion on the right with multiple masses, largest measuring approximately 4 x 3 cm on the right. There are small more subtle nodular lesions in the left lung. No consolidation on the left. There is mild cardiomegaly with pulmonary vascularity within normal limits. There is extensive adenopathy in the right paratracheal and hilar regions. No bone lesions evident. IMPRESSION: Extensive metastatic disease with multiple nodular lesions, much more on the right than the left, as well as right sided adenopathy. Ill-defined consolidation loculated effusion on the right, unchanged. Stable cardiomegaly. No pneumothorax. Electronically Signed   By: Lowella Grip III M.D.   On: 09/26/2016  11:33   Dg Chest Port 1 View  Result Date: 08/28/2016 CLINICAL DATA:  Evaluate pneumothorax after RIGHT lung biopsy. EXAM: PORTABLE CHEST 1 VIEW COMPARISON:  Chest radiograph Jul 13, 2016 FINDINGS: Enlarging RIGHT lung masses with increasing pleural thickening/effusion. Strandy densities RIGHT lung base with elevated RIGHT hemidiaphragm. New LEFT lung nodules. Cardiac silhouette is normal in size. No pneumothorax. Soft tissue planes and included osseous structure nonsuspicious. IMPRESSION: No pneumothorax. Increasing RIGHT pleural disease and enlarging masses RIGHT chest. New LEFT lung nodules. Electronically Signed   By: Elon Alas M.D.   On: 08/28/2016 14:00    Microbiology: Recent Results (from the past 240 hour(s))  Blood Culture (routine x 2)     Status: None (Preliminary result)   Collection Time: 09/26/16 11:30 AM  Result Value Ref Range Status   Specimen Description BLOOD PORTA CATH  Final   Special Requests   Final    BOTTLES DRAWN AEROBIC AND ANAEROBIC Blood Culture adequate volume   Culture   Final    NO GROWTH < 24 HOURS Performed at [facility], [address], [place], [place] [address]    Report Status PENDING  Incomplete  Blood Culture (routine x 2)     Status: None (Preliminary result)   Collection Time: 09/26/16 11:55 AM  Result Value Ref Range Status   Specimen Description BLOOD LEFT ANTECUBITAL  Final   Special Requests   Final    BOTTLES DRAWN AEROBIC AND ANAEROBIC Blood Culture adequate volume   Culture  Final    NO GROWTH < 24 HOURS Performed at [facility], [place] [address], [place], [place] [address]    Report Status PENDING  Incomplete     Labs: Basic Metabolic Panel:  Recent Labs Lab 09/25/16 1247 09/26/16 1121 09/26/16 1128  NA 138 137 137  K 4.9 4.3 4.3  CL  --  102 104  CO2 26  --  25  GLUCOSE 183* 156* 159*  BUN 31.1* 34* 38*  CREATININE 1.0 1.20 1.11  CALCIUM 8.1*  --  7.3*   Liver Function Tests:  Recent  Labs Lab 09/25/16 1247 09/26/16 1128  AST 8 9*  ALT 8 9*  ALKPHOS 180* 124  BILITOT 0.77 0.7  PROT 5.7* 5.5*  ALBUMIN 1.0* 1.3*   No results for input(s): LIPASE, AMYLASE in the last 168 hours. No results for input(s): AMMONIA in the last 168 hours. CBC:  Recent Labs Lab 09/25/16 1247 09/26/16 1121 09/26/16 1128 09/26/16 1800  WBC 18.5*  --  16.2* 16.6*  NEUTROABS 15.5*  --  13.2*  --   HGB 7.8* 6.5* 6.8* 7.7*  HCT 25.9* 19.0* 21.2* 23.5*  MCV 94.2  --  91.8 91.4  PLT 95*  --  84* 92*   Cardiac Enzymes: No results for input(s): CKTOTAL, CKMB, CKMBINDEX, TROPONINI in the last 168 hours. BNP: BNP (last 3 results)  Recent Labs  10/24/15 1043 03/19/16 2120  BNP 57.9 53.9    ProBNP (last 3 results) No results for input(s): PROBNP in the last 8760 hours.  CBG: No results for input(s): GLUCAP in the last 168 hours.     SignedDomenic Polite MD.  [facility] 09/27/2016, 11:55 AM

## 2016-09-27 NOTE — Progress Notes (Signed)
Report called and given to Amanda Pea at [facility]. All nurse's questions answered to her satisfaction. Port flushed and left in tact for transfer. Hale Bogus.

## 2016-09-27 NOTE — Consult Note (Signed)
[facility] Received request from [place] for family interest in [facility]. Appreciate report from Dr. Rowe Pavy. [place] room is available for [name] today. Met with family to complete paper work for transfer today. Dr. Tomasa Hosteller to assume care per family request.   Please fax discharge summary to [phone].  RN please call report to [phone].  Thank you,  David Conte, LCSW [phone]

## 2016-09-27 NOTE — Progress Notes (Signed)
Pt discharged to [facility]. Left unit on stretcher pushed by ambulance staff. Left in stable condition. Medicated with morphine prior to discharge. Wife and son accompanied patient. David [name].

## 2016-09-29 ENCOUNTER — Encounter: Payer: Self-pay | Admitting: *Deleted

## 2016-10-01 LAB — CULTURE, BLOOD (ROUTINE X 2)
CULTURE: NO GROWTH
CULTURE: NO GROWTH
SPECIAL REQUESTS: ADEQUATE
Special Requests: ADEQUATE

## 2016-10-02 ENCOUNTER — Other Ambulatory Visit: Payer: Medicaid Other

## 2016-10-03 ENCOUNTER — Other Ambulatory Visit: Payer: Medicaid Other

## 2016-10-08 ENCOUNTER — Telehealth: Payer: Self-pay | Admitting: Family Medicine

## 2016-10-08 NOTE — Telephone Encounter (Signed)
Sympathy card sent 

## 2016-10-09 ENCOUNTER — Other Ambulatory Visit: Payer: Medicaid Other

## 2016-10-09 ENCOUNTER — Ambulatory Visit: Payer: Medicaid Other | Admitting: Oncology

## 2016-10-16 ENCOUNTER — Ambulatory Visit: Payer: Medicaid Other

## 2016-10-16 ENCOUNTER — Other Ambulatory Visit: Payer: Medicaid Other

## 2016-10-18 DEATH — deceased

## 2016-10-24 ENCOUNTER — Other Ambulatory Visit: Payer: Medicaid Other

## 2016-10-30 ENCOUNTER — Encounter: Payer: Medicaid Other | Admitting: Nutrition

## 2016-10-30 ENCOUNTER — Ambulatory Visit: Payer: Medicaid Other | Admitting: Oncology

## 2016-10-30 ENCOUNTER — Other Ambulatory Visit: Payer: Medicaid Other

## 2016-11-06 ENCOUNTER — Ambulatory Visit: Payer: Medicaid Other

## 2016-11-06 ENCOUNTER — Other Ambulatory Visit: Payer: Medicaid Other

## 2016-11-24 ENCOUNTER — Other Ambulatory Visit: Payer: Self-pay | Admitting: Nurse Practitioner

## 2018-07-22 IMAGING — CT CT CHEST W/ CM
2 of 7 series · 13 of 46 positions shown, 15 images · IV contrast (APPLIED)
Comparison: Multiple exams, including 03/03/2016

CLINICAL DATA: Restaging of metastatic urothelial cancer.

EXAM:
CT CHEST, ABDOMEN, AND PELVIS WITH CONTRAST
TECHNIQUE: Multidetector CT imaging of the chest, abdomen and pelvis was
performed following the standard protocol during bolus
administration of intravenous contrast.
CONTRAST:  <See Chart> 9RXHFX-U66 IOPAMIDOL (9RXHFX-U66) INJECTION
61%

[Series 2: axial post · axial · 0.86mm/px · z∈[-620,-80]mm · 10 of 134 slices shown, 12 images]
[im 13/134  soft-tissue]
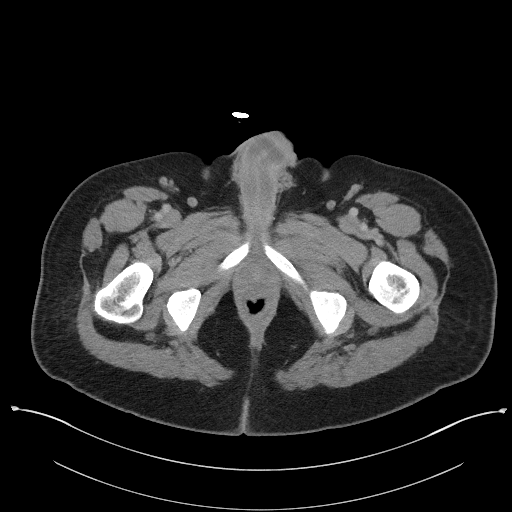
[im 13/134  bone]
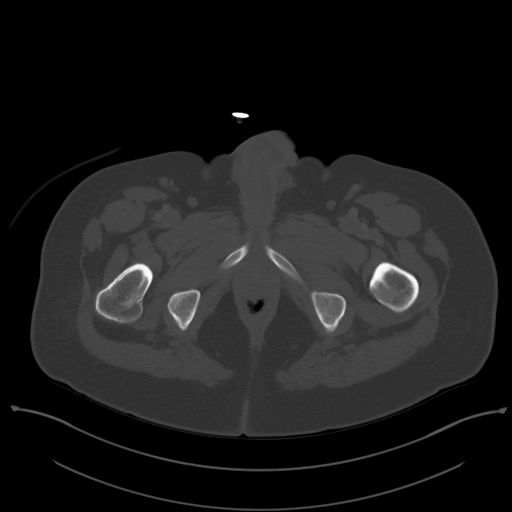
[im 25/134  soft-tissue]
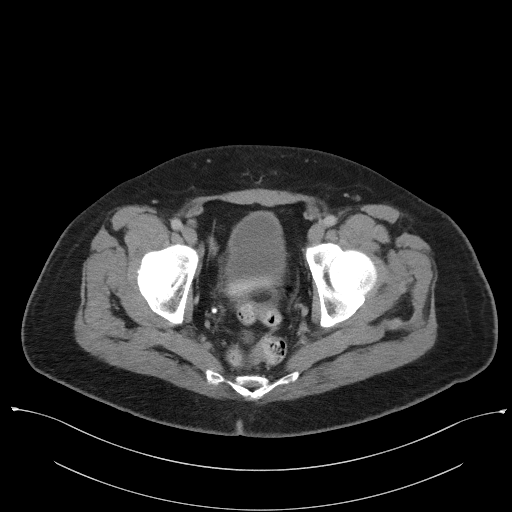
[im 37/134  soft-tissue]
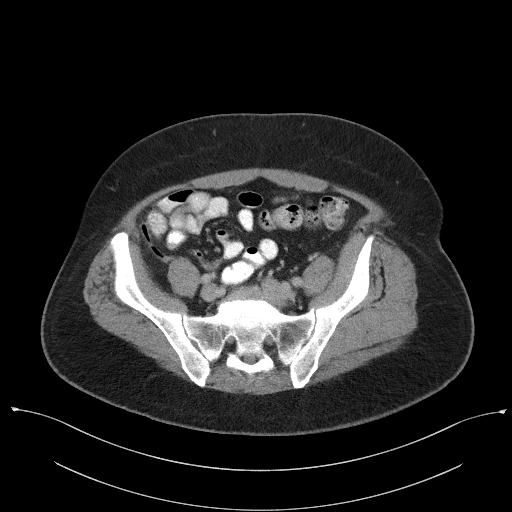
[im 49/134  soft-tissue]
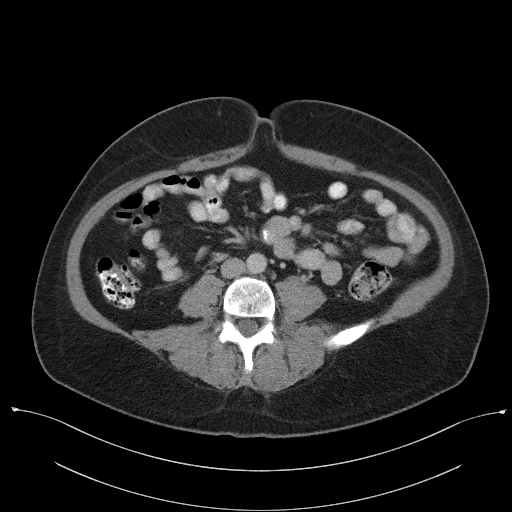
[im 61/134  soft-tissue]
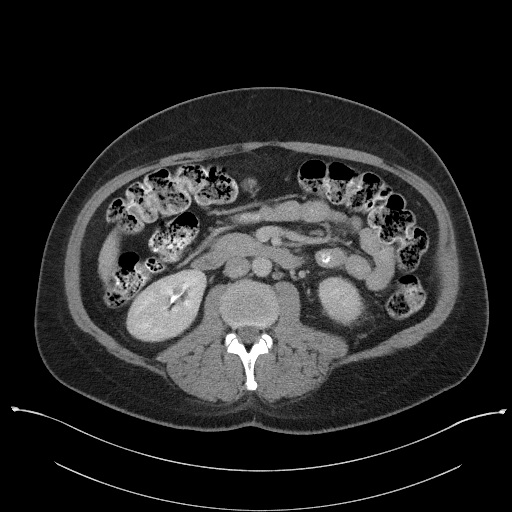
[im 73/134  soft-tissue]
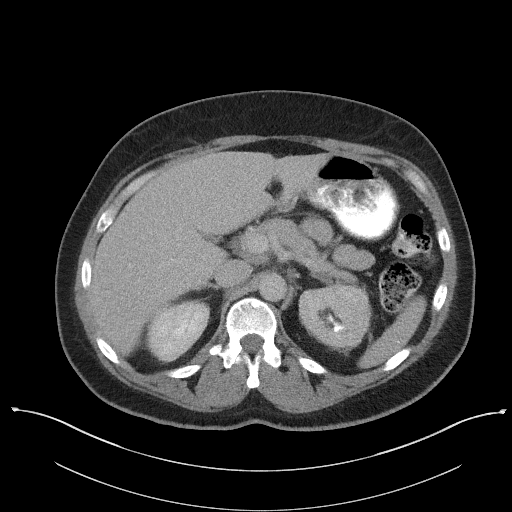
[im 85/134  soft-tissue]
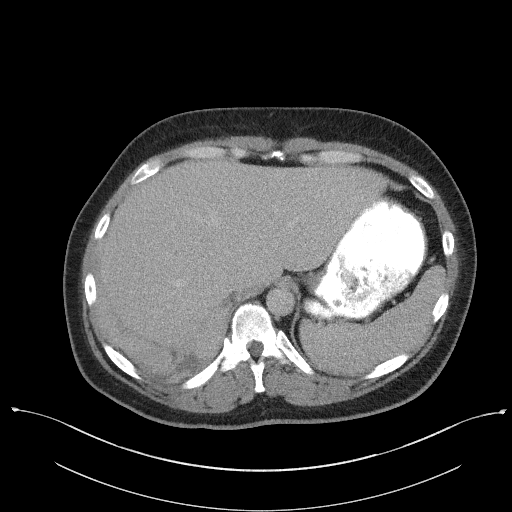
[im 97/134  soft-tissue]
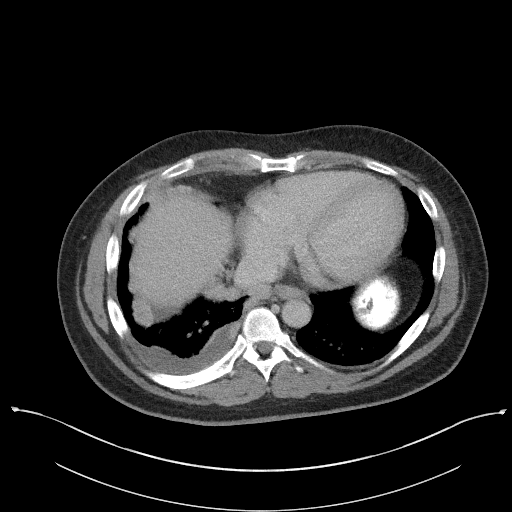
[im 109/134  soft-tissue]
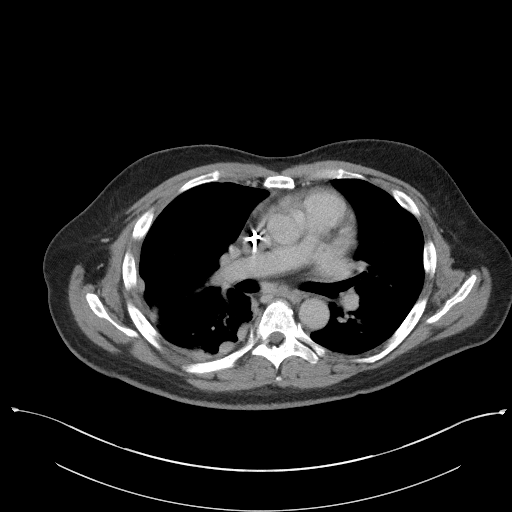
[im 109/134  bone]
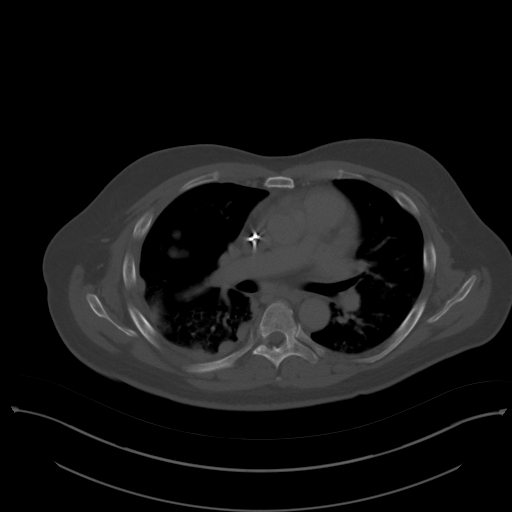
[im 121/134  soft-tissue]
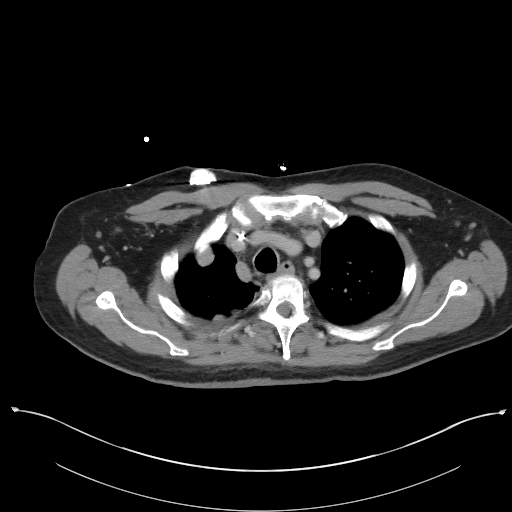

[Series 6: coronal post · coronal · 0.83mm/px · 3 of 101 slices shown]
[im 26/101  soft-tissue]
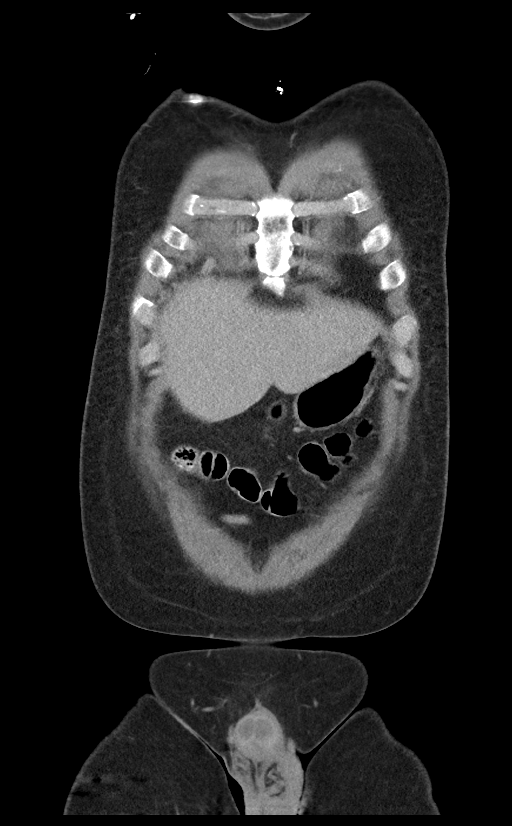
[im 51/101  soft-tissue]
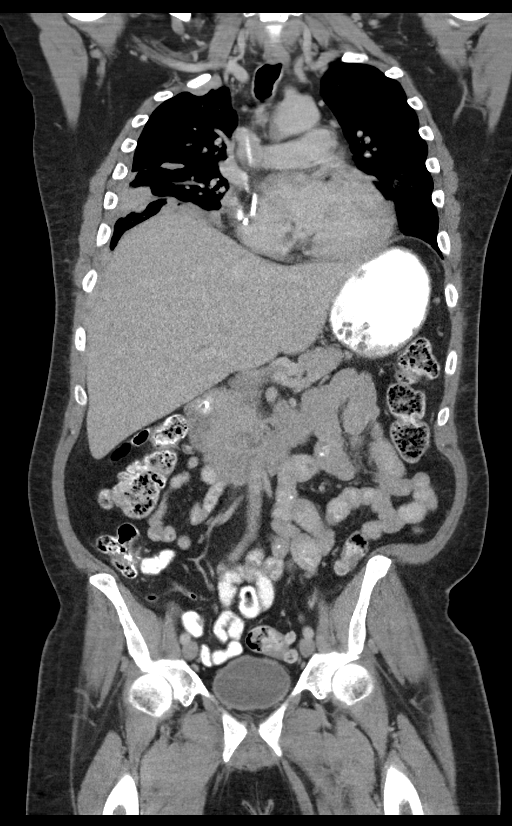
[im 76/101  soft-tissue]
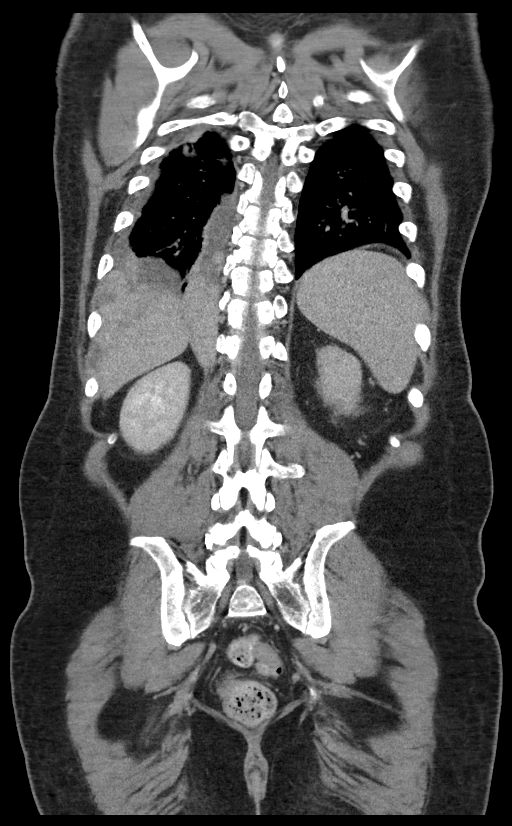

[13 of 46 positions shown; findings below may reference images not displayed]

FINDINGS: CT CHEST FINDINGS

Cardiovascular: Right Port-A-Cath tip: Right atrium. Mild
cardiomegaly.

Mediastinum/Nodes: Right anterior pericardial space lymph node
cm in short axis on image 41/2, previously not appreciated.

Lungs/Pleura: Extensive new pleural metastatic disease on the right
with pleural nodules and masses along the fissures and pleural
surfaces. A conglomerate rind like mass medially along the right
posterior costophrenic angle measures 2.4 cm in thickness on image
49/2. An index pleural nodule along the right mediastinal border
measures 1.1 cm in thickness on image [DATE]. Numerous additional
scattered and confluent pleural nodules are present on the right.
Small right malignant pleural effusion.

Bilateral mild mosaic attenuation in the lungs.

Musculoskeletal: Several small sclerotic rib lesions are stable.

CT ABDOMEN PELVIS FINDINGS

Hepatobiliary: Contracted gallbladder with thickened wall. Small
gallstone, image 66/2. The prior tiny arterial phase enhancing
lesion anteriorly in the lateral segment left hepatic lobe is not
well seen on today' s portal venous phase images through the liver.

Pancreas: Unremarkable

Spleen: Unremarkable

Adrenals/Urinary Tract: Indistinct hypodense likely infiltrative
mass along the left renal pelvis with some heterogeneous enhancement
throughout much of the left kidney on the portal venous phase
images. Difficult to measure due to the poor contrast between the
mass in the rest of the enhancing kidney. The tubular contrast on
the initial image series seems significantly reduced on the left
compared to the right but I do not see definite renal vein
thrombosis. No hydronephrosis. Overall the distribution of abnormal
enhancement in the left kidney is fairly similar to 03/03/2016.
Abnormal effacement of portions of the left collecting system. Mild
left perirenal stranding.

Small left kidney lower pole cyst.

Adrenal glands unremarkable.

Stomach/Bowel: Unremarkable

Vascular/Lymphatic: Just above the distal transverse duodenum on
image 69/2, there is a 3.1 by 2.2 cm structure of similar density to
the surrounding non-opacified bowel but thought to probably
represent an abnormally enlarged mesenteric lymph node. Adjacent
mesenteric node measures 1.1 cm in short axis on image 72/2.

Reproductive: Unremarkable

Other: No supplemental non-categorized findings.

Musculoskeletal: Lumbar spondylosis and degenerative disc disease
causing impingement in the neural foramina bilaterally at L5-S1.

Oval-shaped sclerotic lesion in the left iliac wing 1.7 cm in long
axis, stable.

0.9 cm sclerotic lesion along the iliac side of the right SI joint
on image 95/2, stable.

1.5 cm sclerotic lesion in the right sacrum on image 102/2, stable.
5 mm sclerotic lesion anteriorly in the L1 vertebral body, stable.
IMPRESSION: 1. Extensive progression of right pleural metastatic disease with
numerous new nodules and masses along the pleura, and a small
malignant pleural effusion. Abnormal enlarged right pericardial
lymph node.
2. Similar appearance of infiltrative tumor along the left renal
hilum, difficult to measure given the similar density to the
surrounding renal parenchyma, with associated perirenal stranding.
3. Suspected mesenteric adenopathy just above the transverse
duodenum, measuring up to 2.2 cm in long axis.
4. Stable sclerotic lesions particularly notable in the bony pelvis,
probably from prior metastatic disease.
5. Impingement at L5-S1 due to spondylosis and degenerative disc
disease.
6. Cholelithiasis with contracted gallbladder.
7. Mild cardiomegaly.

## 2018-10-01 IMAGING — CT CT BIOPSY
1 of 3 series · 13 of 32 positions shown, 18 images · non-contrast
Comparison: none

INDICATION: 56-year-old male with a history of urothelial carcinoma. Likely
pleural metastases

[Series 2: i-spiral 5.0 b40f · axial · 0.63mm/px · z∈[+1335,+1612]mm · 13 of 91 slices shown, 18 images]
[im 6/91  soft-tissue]
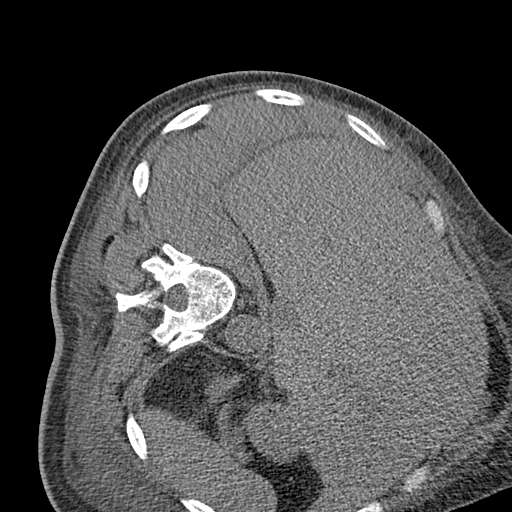
[im 6/91  bone]
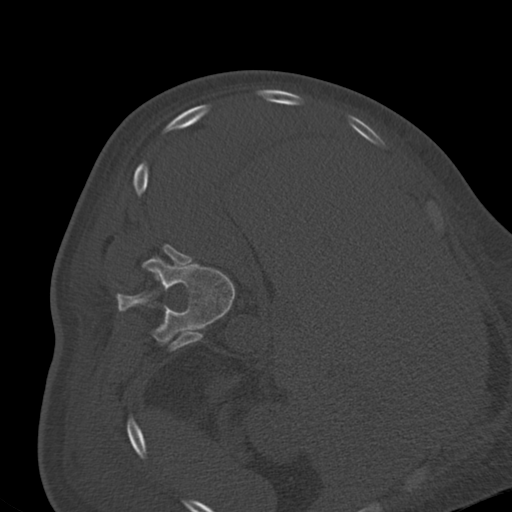
[im 16/91  soft-tissue]
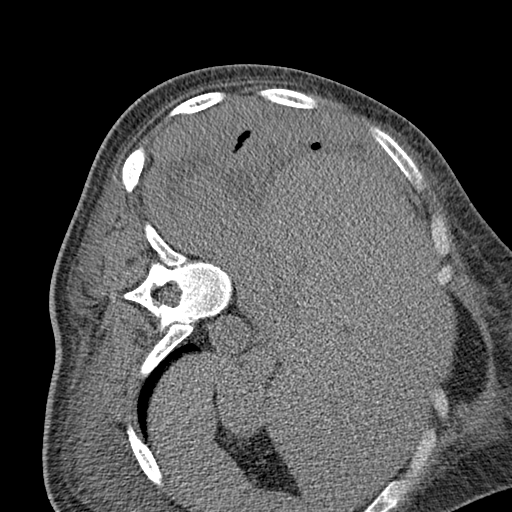
[im 22/91  soft-tissue]
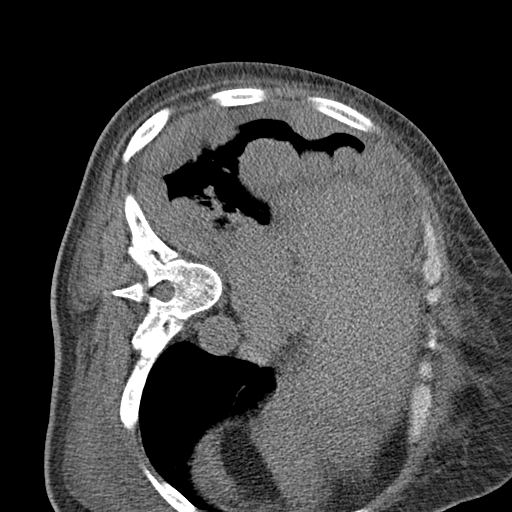
[im 27/91  soft-tissue]
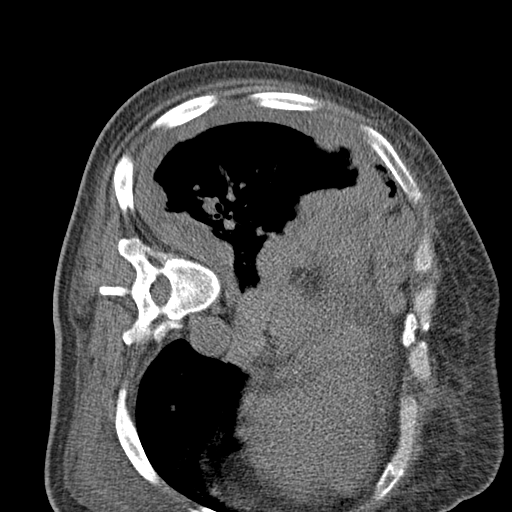
[im 38/91  soft-tissue]
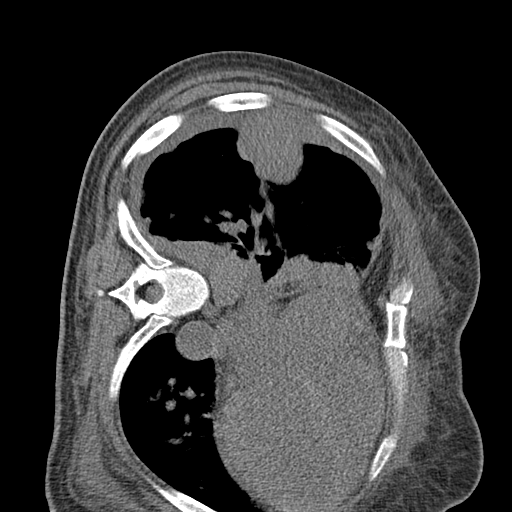
[im 43/91  soft-tissue]
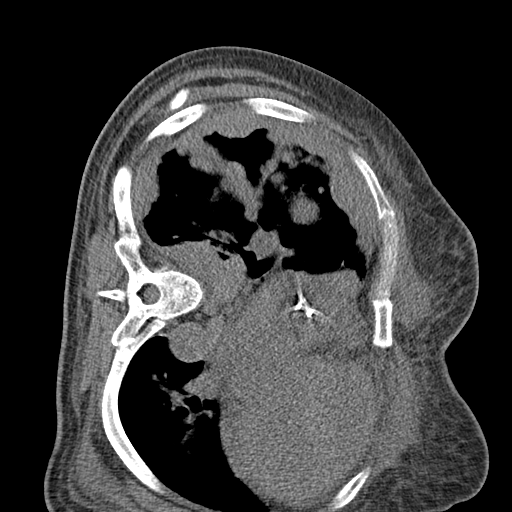
[im 48/91  soft-tissue]
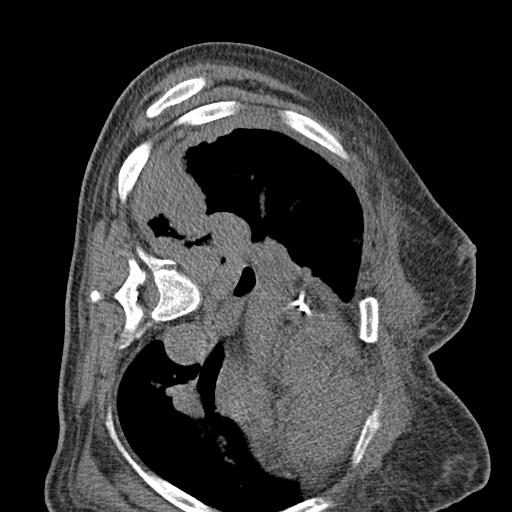
[im 59/91  soft-tissue]
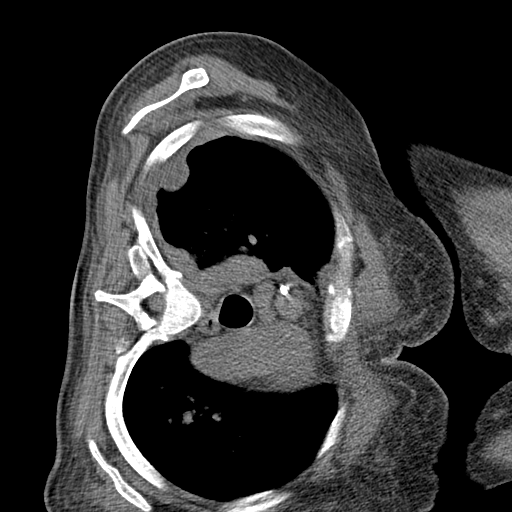
[im 64/91  soft-tissue]
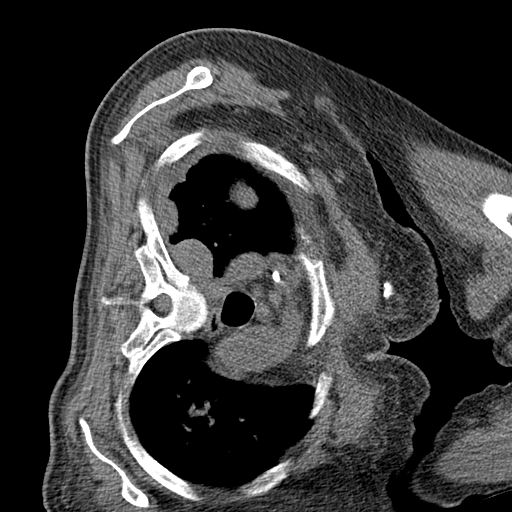
[im 64/91  bone]
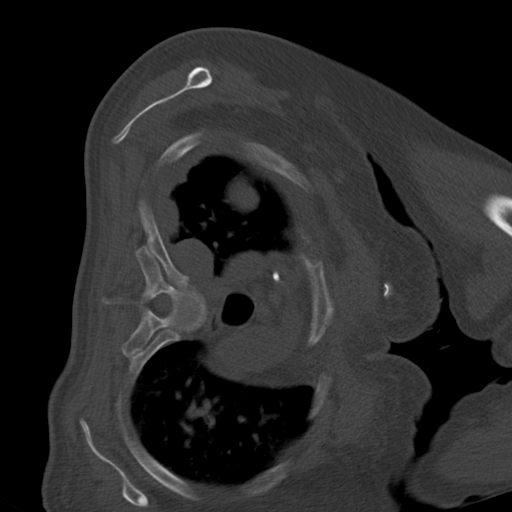
[im 69/91  soft-tissue]
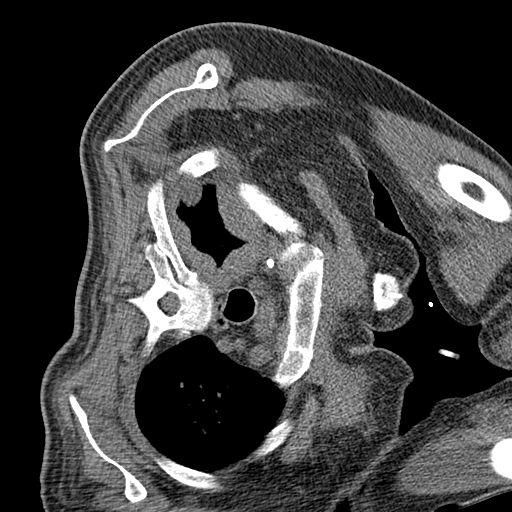
[im 69/91  lung]
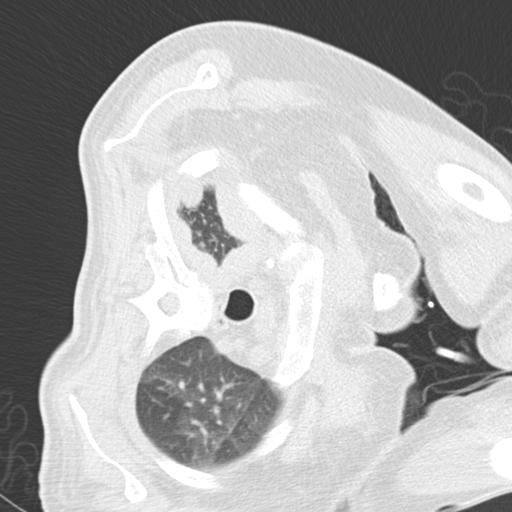
[im 75/91  lung]
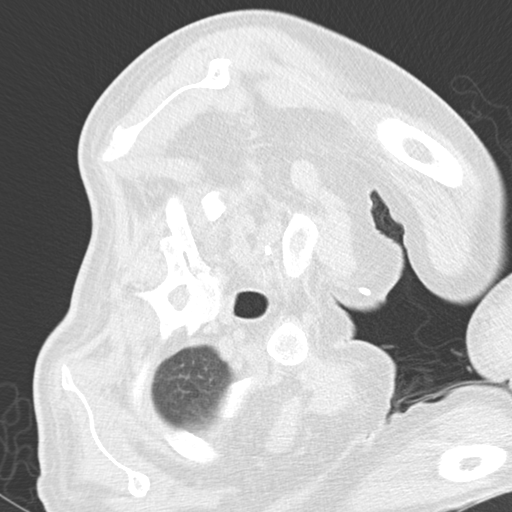
[im 80/91  soft-tissue]
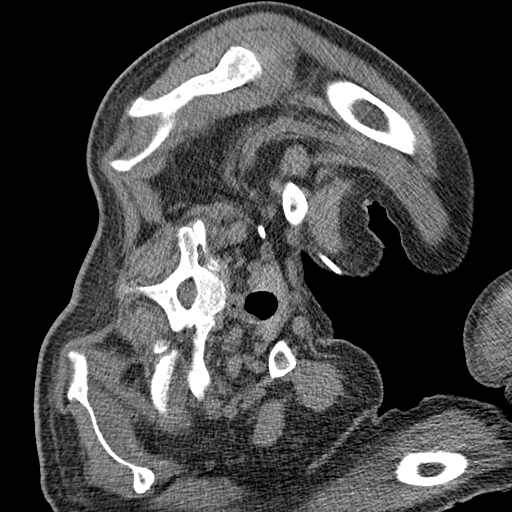
[im 80/91  lung]
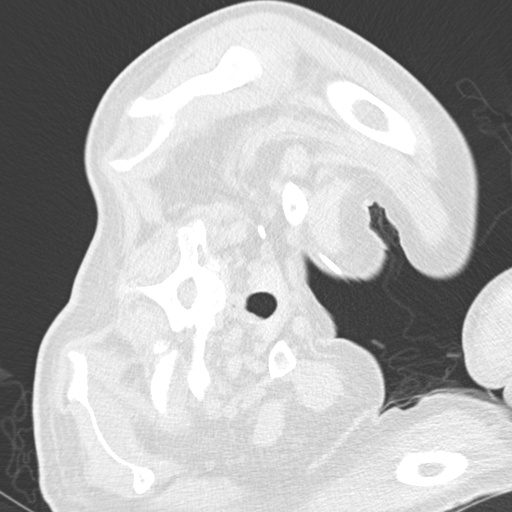
[im 85/91  soft-tissue]
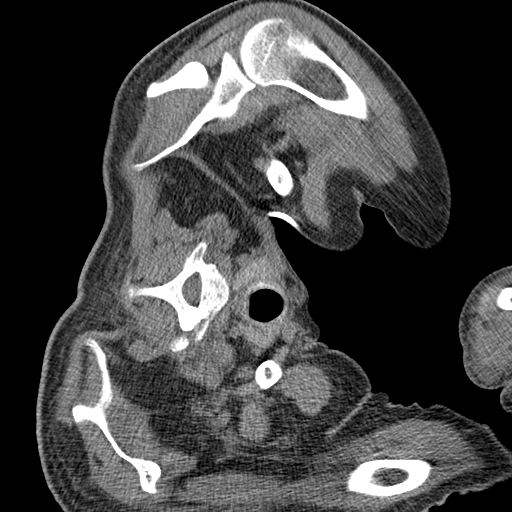
[im 85/91  lung]
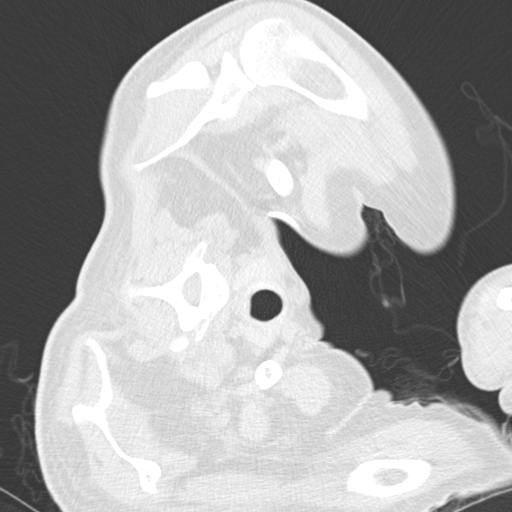

[13 of 32 positions shown; findings below may reference images not displayed]

EXAM:
CT BIOPSY

MEDICATIONS:
None.

ANESTHESIA/SEDATION:
Moderate (conscious) sedation was employed during this procedure. A
total of Versed 1.0 mg and Fentanyl 50 mcg was administered
intravenously.

Moderate Sedation Time: 10 minutes. The patient's level of
consciousness and vital signs were monitored continuously by
radiology nursing throughout the procedure under my direct
supervision.

FLUOROSCOPY TIME:  CT

COMPLICATIONS:
None

PROCEDURE:
The procedure, risks, benefits, and alternatives were explained to
the patient and the patient's family. Specific risks that were
addressed included bleeding, infection, pneumothorax, need for
further procedure including chest tube placement, chance of delayed
pneumothorax or hemorrhage, hemoptysis, nondiagnostic sample,
cardiopulmonary collapse, death. Questions regarding the procedure
were encouraged and answered. The patient understands and consents
to the procedure.

Patient was positioned in the left decubitus position on the CT
gantry table and a scout CT of the chest was performed for planning
purposes.

Once angle of approach was determined, the skin and subcutaneous
tissues this scan was prepped and draped in the usual sterile
fashion, and a sterile drape was applied covering the operative
field. A sterile gown and sterile gloves were used for the
procedure. Local anesthesia was provided with 1% Lidocaine.

The skin and subcutaneous tissues were infiltrated 1% lidocaine for
local anesthesia, and a small stab incision was made with an 11
blade scalpel.

Using CT guidance, a 17 gauge trocar needle was advanced into the
soft tissuetarget of the right posterior pleura. After confirmation
of the tip, separate 18 gauge core biopsies were performed. These
were placed into solution for transportation to the lab. Needle was
then removed.

Final CT image was performed.

Patient tolerated the procedure well and remained hemodynamically
stable throughout.

No complications were encountered and no significant blood loss was
encounter
IMPRESSION: Status post CT-guided biopsy of right-sided pleural soft tissue.
Tissue specimen sent to pathology for complete histopathologic
analysis.
